# Patient Record
Sex: Female | Born: 1960 | Race: White | Hispanic: No | Marital: Married | Smoking: Current every day smoker
Health system: Southern US, Community
[De-identification: ages and names within clinical notes are randomized; demographics above are authoritative.]

## PROBLEM LIST (undated history)

## (undated) DIAGNOSIS — F419 Anxiety disorder, unspecified: Secondary | ICD-10-CM

## (undated) DIAGNOSIS — J449 Chronic obstructive pulmonary disease, unspecified: Secondary | ICD-10-CM

## (undated) DIAGNOSIS — F1910 Other psychoactive substance abuse, uncomplicated: Secondary | ICD-10-CM

## (undated) DIAGNOSIS — J45909 Unspecified asthma, uncomplicated: Secondary | ICD-10-CM

## (undated) DIAGNOSIS — M5136 Other intervertebral disc degeneration, lumbar region: Secondary | ICD-10-CM

## (undated) DIAGNOSIS — M7989 Other specified soft tissue disorders: Secondary | ICD-10-CM

## (undated) DIAGNOSIS — R0600 Dyspnea, unspecified: Secondary | ICD-10-CM

## (undated) DIAGNOSIS — K219 Gastro-esophageal reflux disease without esophagitis: Secondary | ICD-10-CM

## (undated) DIAGNOSIS — Z87442 Personal history of urinary calculi: Secondary | ICD-10-CM

## (undated) DIAGNOSIS — J189 Pneumonia, unspecified organism: Secondary | ICD-10-CM

## (undated) DIAGNOSIS — C801 Malignant (primary) neoplasm, unspecified: Secondary | ICD-10-CM

## (undated) DIAGNOSIS — G629 Polyneuropathy, unspecified: Secondary | ICD-10-CM

## (undated) DIAGNOSIS — E119 Type 2 diabetes mellitus without complications: Secondary | ICD-10-CM

## (undated) DIAGNOSIS — M1990 Unspecified osteoarthritis, unspecified site: Secondary | ICD-10-CM

## (undated) DIAGNOSIS — M51369 Other intervertebral disc degeneration, lumbar region without mention of lumbar back pain or lower extremity pain: Secondary | ICD-10-CM

## (undated) DIAGNOSIS — F32A Depression, unspecified: Secondary | ICD-10-CM

## (undated) DIAGNOSIS — Z87898 Personal history of other specified conditions: Secondary | ICD-10-CM

## (undated) DIAGNOSIS — I10 Essential (primary) hypertension: Secondary | ICD-10-CM

## (undated) DIAGNOSIS — F1011 Alcohol abuse, in remission: Secondary | ICD-10-CM

## (undated) DIAGNOSIS — F1291 Cannabis use, unspecified, in remission: Secondary | ICD-10-CM

## (undated) HISTORY — PX: TONSILLECTOMY: SUR1361

## (undated) HISTORY — PX: OTHER SURGICAL HISTORY: SHX169

## (undated) HISTORY — DX: Personal history of other specified conditions: Z87.898

## (undated) HISTORY — DX: Essential (primary) hypertension: I10

## (undated) HISTORY — DX: Other psychoactive substance abuse, uncomplicated: F19.10

## (undated) HISTORY — DX: Alcohol abuse, in remission: F10.11

## (undated) HISTORY — DX: Cannabis use, unspecified, in remission: F12.91

## (undated) HISTORY — PX: DILATION AND CURETTAGE OF UTERUS: SHX78

## (undated) HISTORY — DX: Type 2 diabetes mellitus without complications: E11.9

## (undated) HISTORY — PX: ECTOPIC PREGNANCY SURGERY: SHX613

## (undated) HISTORY — DX: Unspecified asthma, uncomplicated: J45.909

---

## 1982-12-29 DIAGNOSIS — Z8543 Personal history of malignant neoplasm of ovary: Secondary | ICD-10-CM

## 1982-12-29 HISTORY — DX: Personal history of malignant neoplasm of ovary: Z85.43

## 1983-12-30 HISTORY — PX: CERVICAL CONIZATION W/BX: SHX1330

## 2012-07-26 DIAGNOSIS — J439 Emphysema, unspecified: Secondary | ICD-10-CM

## 2012-07-26 HISTORY — DX: Emphysema, unspecified: J43.9

## 2014-02-03 ENCOUNTER — Ambulatory Visit (INDEPENDENT_AMBULATORY_CARE_PROVIDER_SITE_OTHER): Payer: Managed Care, Other (non HMO) | Admitting: Physician Assistant

## 2014-02-03 ENCOUNTER — Encounter: Payer: Self-pay | Admitting: Physician Assistant

## 2014-02-03 VITALS — BP 122/79 | HR 127 | Wt 198.0 lb

## 2014-02-03 DIAGNOSIS — G629 Polyneuropathy, unspecified: Secondary | ICD-10-CM

## 2014-02-03 DIAGNOSIS — C539 Malignant neoplasm of cervix uteri, unspecified: Secondary | ICD-10-CM

## 2014-02-03 DIAGNOSIS — Z23 Encounter for immunization: Secondary | ICD-10-CM

## 2014-02-03 DIAGNOSIS — IMO0001 Reserved for inherently not codable concepts without codable children: Secondary | ICD-10-CM

## 2014-02-03 DIAGNOSIS — I498 Other specified cardiac arrhythmias: Secondary | ICD-10-CM

## 2014-02-03 DIAGNOSIS — G589 Mononeuropathy, unspecified: Secondary | ICD-10-CM

## 2014-02-03 DIAGNOSIS — R000 Tachycardia, unspecified: Secondary | ICD-10-CM

## 2014-02-03 DIAGNOSIS — Z1239 Encounter for other screening for malignant neoplasm of breast: Secondary | ICD-10-CM

## 2014-02-03 DIAGNOSIS — M7630 Iliotibial band syndrome, unspecified leg: Secondary | ICD-10-CM

## 2014-02-03 DIAGNOSIS — M2420 Disorder of ligament, unspecified site: Secondary | ICD-10-CM

## 2014-02-03 DIAGNOSIS — IMO0002 Reserved for concepts with insufficient information to code with codable children: Secondary | ICD-10-CM

## 2014-02-03 DIAGNOSIS — I10 Essential (primary) hypertension: Secondary | ICD-10-CM

## 2014-02-03 DIAGNOSIS — R4789 Other speech disturbances: Secondary | ICD-10-CM

## 2014-02-03 DIAGNOSIS — Z1211 Encounter for screening for malignant neoplasm of colon: Secondary | ICD-10-CM

## 2014-02-03 DIAGNOSIS — J45909 Unspecified asthma, uncomplicated: Secondary | ICD-10-CM | POA: Insufficient documentation

## 2014-02-03 DIAGNOSIS — E1165 Type 2 diabetes mellitus with hyperglycemia: Secondary | ICD-10-CM | POA: Insufficient documentation

## 2014-02-03 DIAGNOSIS — M629 Disorder of muscle, unspecified: Secondary | ICD-10-CM

## 2014-02-03 DIAGNOSIS — R413 Other amnesia: Secondary | ICD-10-CM

## 2014-02-03 HISTORY — DX: Essential (primary) hypertension: I10

## 2014-02-03 MED ORDER — AMITRIPTYLINE HCL 150 MG PO TABS: 150.0000 mg | ORAL_TABLET | Freq: Every day | ORAL | Status: DC

## 2014-02-03 NOTE — Patient Instructions (Signed)
Iliotibial Band Syndrome  with Rehab  The iliotibial (IT) band is a tendon that connects the hip muscles to the shinbone (tibia) and to one of the bones of the pelvis (ileum). The IT band passes by the knee and is often irritated by the outer portion of the knee (lateral femoral condyle). A fluid filled sac (bursa) exists between the tendon and the bone, to cushion and reduce friction. Overuse of the tendon may cause excessive friction, which results in IT band syndrome. This condition involves inflammation of the bursa (bursitis) and/or inflammation of the IT band (tendinitis).  SYMPTOMS   · Pain, tenderness, swelling, warmth, or redness over the IT band, at the outer knee (above the joint).  · Pain that travels up or down the thigh or leg.  · Initially, pain at the beginning of an exercise, that decreases once warmed up. Eventually, pain throughout the activity, getting worse as the activity continues. May cause the athlete to stop in the middle of training or competing.  · Pain that gets worse when running down hills or stairs, on banked tracks, or next to the curb on the street.  · Pain that increases when the foot of the affected leg hits the ground.  · Possibly, a crackling sound (crepitation) when the tendon or bursa is moved or touched.  CAUSES   IT band syndrome is caused by irritation of the IT band and the underlying bursa. This eventually results in inflammation and pain. IT band syndrome is an overuse injury.   RISK INCREASES WITH:  · Sports with repetitive knee-bending activities (distance running, cycling).  · Incorrect training techniques, including sudden changes in the intensity, frequency, or duration of training.  · Not enough rest between workouts.  · Poor strength and flexibility, especially a tight IT band.  · Failure to warm up properly before activity.  · Bow legs.  · Arthritis of the knee.  PREVENTION   · Warm up and stretch properly before activity.  · Allow for adequate recovery between  workouts.  · Maintain physical fitness:  · Strength, flexibility, and endurance.  · Cardiovascular fitness.  · Learn and use proper training technique, including reducing running mileage, shortening stride, and avoiding running on hills and banked surfaces.  · Wear arch supports (orthotics), if you have flat feet.  PROGNOSIS   If treated properly, IT band syndrome usually goes away within 6 weeks of treatment.  RELATED COMPLICATIONS   · Longer healing time, if not properly treated, or if not given enough time to heal.  · Recurring inflammation of the tendon and bursa, that may result in a chronic condition.  · Recurring symptoms, if activity is resumed too soon, with overuse, with a direct blow, or with poor training technique.  · Inability to complete training or competition.  TREATMENT   Treatment first involves the use of ice and medicine, to reduce pain and inflammation. The use of strengthening and stretching exercises may help reduce pain with activity. These exercises may be performed at home or with a therapist. For individuals with flat feet, an arch support (orthotic) may be helpful. Some individuals find that wearing a knee sleeve or compression bandage around the knee during workouts provides some relief. Certain training techniques, such as adjusting stride length, avoiding running on hills or stairs, changing the direction you run on a circular or banked track, or changing the side of the road you run on, if you run next to the curb, may help decrease symptoms   of IT band syndrome. Cyclists may need to change the seat height or foot position on their bicycles. An injection of cortisone into the bursa may be recommended. Surgery to remove the inflamed bursa and/or part of the IT band is only considered after at least 6 months of non-surgical treatment.   MEDICATION   · If pain medicine is needed, nonsteroidal anti-inflammatory medicines (aspirin and ibuprofen), or other minor pain relievers  (acetaminophen), are often advised.  · Do not take pain medicine for 7 days before surgery.  · Prescription pain relievers may be given, if your caregiver thinks they are needed. Use only as directed and only as much as you need.  · Corticosteroid injections may be given by your caregiver. These injections should be reserved for the most serious cases, because they may only be given a certain number of times.  HEAT AND COLD  · Cold treatment (icing) should be applied for 10 to 15 minutes every 2 to 3 hours for inflammation and pain, and immediately after activity that aggravates your symptoms. Use ice packs or an ice massage.  · Heat treatment may be used before performing stretching and strengthening activities prescribed by your caregiver, physical therapist, or athletic trainer. Use a heat pack or a warm water soak.  SEEK MEDICAL CARE IF:   · Symptoms get worse or do not improve in 2 to 4 weeks, despite treatment.  · New, unexplained symptoms develop. (Drugs used in treatment may produce side effects.)  EXERCISES   RANGE OF MOTION (ROM) AND STRETCHING EXERCISES - Iliotibial Band Syndrome  These exercises may help you when beginning to rehabilitate your injury. Your symptoms may go away with or without further involvement from your physician, physical therapist or athletic trainer. While completing these exercises, remember:   · Restoring tissue flexibility helps normal motion to return to the joints. This allows healthier, less painful movement and activity.  · An effective stretch should be held for at least 30 seconds.  · A stretch should never be painful. You should only feel a gentle lengthening or release in the stretched tissue.  STRETCH - Quadriceps, Prone   · Lie on your stomach on a firm surface, such as a bed or padded floor.  · Bend your right / left knee and grasp your ankle. If you are unable to reach your ankle or pant leg, use a belt around your foot to lengthen your reach.  · Gently pull your heel  toward your buttocks. Your knee should not slide out to the side. You should feel a stretch in the front of your thigh and knee.  · Hold this position for __________ seconds.  Repeat __________ times. Complete this stretch __________ times per day.   STRETCH  Iliotibial Band  · On the floor or bed, lie on your side, so your right / left leg is on top. Bend your knee and grab your ankle.  · Slowly bring your knee back so that your thigh is in line with your trunk. Keep your heel at your buttocks and gently arch your back, so your head, shoulders and hips line up.  · Slowly lower your leg so that your knee approaches the floor or bed, until you feel a gentle stretch on the outside of your right / left thigh. If you do not feel a stretch and your knee will not fall farther, place the heel of your opposite foot on top of your knee, and pull your thigh down farther.  ·   Hold this stretch for __________ seconds.  Repeat __________ times. Complete this stretch __________ times per day.  STRENGTHENING EXERCISES - Iliotibial Band Syndrome  Improving the flexibility of the IT band will best relieve your discomfort due to IT band syndrome. Strengthening exercises, however, can help improve both muscle endurance and joint mechanics, reducing the factors that can contribute to this condition. Your physician, physical therapist or athletic trainer may provide you with exercises that train specific muscle groups that are especially weak. The following exercises target muscles that are often weak in people who have IT band syndrome.  STRENGTH - Hip Abductors, Straight Leg Raises   Be aware of your form throughout the entire exercise, so that you exercise the correct muscles. Poor form means that you are not strengthening the correct muscles.  · Lie on your side, so that your head, shoulders, knee and hip line up. You may bend your lower knee to help maintain your balance. Your right / left leg should be on top.  · Roll your hips  slightly forward, so that your hips are stacked directly over each other and your right / left knee is facing forward.  · Lift your top leg up 4-6 inches, leading with your heel. Be sure that your foot does not drift forward and that your knee does not roll toward the ceiling.  · Hold this position for __________ seconds. You should feel the muscles in your outer hip lifting (you may not notice this until your leg begins to tire).  · Slowly lower your leg to the starting position. Allow the muscles to fully relax before beginning the next repetition.  Repeat __________ times. Complete this exercise __________ times per day.   STRENGTH - Quad/VMO, Isometric  · Sit in a chair with your right / left knee slightly bent. With your fingertips, feel the VMO muscle (just above the inside of your knee). The VMO is important in controlling the position of your kneecap.  · Keeping your fingertips on this muscle. Without actually moving your leg, attempt to drive your knee down, as if straightening your leg. You should feel your VMO tense. If you have a difficult time, you may wish to try the same exercise on your healthy knee first.  · Tense this muscle as hard as you can, without increasing any knee pain.  · Hold for __________ seconds. Relax the muscles slowly and completely between each repetition.  Repeat __________ times. Complete this exercise __________ times per day.   Document Released: 12/15/2005 Document Revised: 03/08/2012 Document Reviewed: 03/29/2009  [employer]® Patient Information ©2014 [employer].

## 2014-02-05 DIAGNOSIS — G629 Polyneuropathy, unspecified: Secondary | ICD-10-CM | POA: Insufficient documentation

## 2014-02-05 DIAGNOSIS — C539 Malignant neoplasm of cervix uteri, unspecified: Secondary | ICD-10-CM | POA: Insufficient documentation

## 2014-02-05 DIAGNOSIS — M7630 Iliotibial band syndrome, unspecified leg: Secondary | ICD-10-CM

## 2014-02-05 DIAGNOSIS — R413 Other amnesia: Secondary | ICD-10-CM | POA: Insufficient documentation

## 2014-02-05 DIAGNOSIS — R000 Tachycardia, unspecified: Secondary | ICD-10-CM | POA: Insufficient documentation

## 2014-02-05 DIAGNOSIS — R4789 Other speech disturbances: Secondary | ICD-10-CM | POA: Insufficient documentation

## 2014-02-05 HISTORY — DX: Tachycardia, unspecified: R00.0

## 2014-02-05 HISTORY — DX: Other amnesia: R41.3

## 2014-02-05 HISTORY — DX: Polyneuropathy, unspecified: G62.9

## 2014-02-05 HISTORY — DX: Other speech disturbances: R47.89

## 2014-02-05 HISTORY — DX: Malignant neoplasm of cervix uteri, unspecified: C53.9

## 2014-02-05 HISTORY — DX: Iliotibial band syndrome, unspecified leg: M76.30

## 2014-02-05 NOTE — Progress Notes (Signed)
Subjective:    Patient ID: Kathleen Le, female    DOB: June 22, 1961, 53 y.o.   MRN: [id number]  HPI Pt is a 53 yo WF who presents to the clinic to establish care.  DM- per pt was last seen a month ago. Cannot remember A1C. Does not check sugars regularly. Denies any hypoglyemic events.   HTN- taking medications regularly. NO CP, palpitations, vision changes, headaches.   Left leg pain- started 3 months ago once starting exerciseing more. Hurts worse after exercising and a lot of walking. Better with rest. Ibuprofen does help. Pain located on the outside of left leg from hip to below knee.   Having some difficulty finding words and feels like memory is getting worse. Her children will tell her things and she does not remember. She will be talking and cannot think of words to say.   Asthma- no problems or symptoms. No regular or PRN inhalers.   Missed period- no period in 2 months. Recently went off OCP. Concerned she could be pregnant.   . Active Ambulatory Problems    Diagnosis Date Noted  . Type II diabetes mellitus, uncontrolled 02/03/2014  . Unspecified asthma(493.90) 02/03/2014  . Essential hypertension, benign 02/03/2014  . Neuropathy 02/05/2014  . IT band syndrome 02/05/2014  . Sinus tachycardia 02/05/2014  . Memory changes 02/05/2014  . Word finding difficulty 02/05/2014  . Cervical cancer 02/05/2014   Resolved Ambulatory Problems    Diagnosis Date Noted  . No Resolved Ambulatory Problems   Past Medical History  Diagnosis Date  . Hypertension   . Diabetes mellitus without complication   . Asthma     . Family History  Problem Relation Age of Onset  . Heart attack Mother   . Cancer Mother   . Diabetes Father   . Heart attack Father   . Diabetes Paternal Uncle   . Cancer Paternal Grandmother   . Diabetes Paternal Grandmother   . Diabetes Paternal Grandfather   . Diabetes Paternal Uncle   . Heart attack Maternal Aunt   . Cancer Maternal Uncle   . Cancer  Maternal Grandmother     . History   Social History  . Marital Status: Married    Spouse Name: N/A    Number of Children: N/A  . Years of Education: N/A   Occupational History  . Not on file.   Social History Main Topics  . Smoking status: Current Every Day Smoker  . Smokeless tobacco: Not on file  . Alcohol Use: No  . Drug Use: No  . Sexual Activity: Yes   Other Topics Concern  . Not on file   Social History Narrative  . No narrative on file     Review of Systems  All other systems reviewed and are negative.       Objective:   Physical Exam  Constitutional: She is oriented to person, place, and time. She appears well-developed and well-nourished.  HENT:  Head: Normocephalic and atraumatic.  Cardiovascular: Regular rhythm and normal heart sounds.   Tachycardia at 107 at recheck.   Pulmonary/Chest: Effort normal and breath sounds normal. She has no wheezes.  Musculoskeletal:  Left leg- no pain over hip or knee joint. No tenderness over muscles today. Strength 5/5. Patellar reflexes 2+ bilateral. No knee joint pain. Some pain below lateral knee. ROM at hip and knee full and without pain. Negative mcmurrays, anterior drawer negative. Discomfort with internal rotation at hip.  Neurological: She is alert and oriented to person,  place, and time.  Skin: Skin is dry.  Psychiatric: She has a normal mood and affect. Her behavior is normal.          Assessment & Plan:  Diabetes mellitus- will check at next visit once get records. Need pneumonia vaccine and got today.   HTN- controlled today. Continue on HCTZ.   Sinus tachycardia- rechecked at 107. Will continue to monitor. Will consider BB if continues. Pt does not want to start another medication and admits to being very nervous today.   Asthma- controlled not having to use any inhaler nor rescue inhaler.   Memory changes/word finding difficultly- Dementia panel done. Pt instructed to come back for Mini mental  status exam.   IT band syndrome- lateral leg pain from hip to knee on left leg. Gave stretches, encouraged motrin, icing and stretches. Follow up if not improving in 4 weeks or worsening.   Missed period- UPT negative. Likely transitioning into menopause. Made pt aware of transition. No symptoms to report today.   Will order mammogram/colonoscopy. Pt believes she has recent labs will wait to get records to order labs.   Pt got tdap and pneumonia vaccine today.

## 2014-02-15 ENCOUNTER — Other Ambulatory Visit: Payer: Self-pay | Admitting: Physician Assistant

## 2014-02-15 ENCOUNTER — Telehealth: Payer: Self-pay | Admitting: *Deleted

## 2014-02-15 MED ORDER — AMITRIPTYLINE HCL 100 MG PO TABS: 100.0000 mg | ORAL_TABLET | Freq: Every day | ORAL | Status: DC

## 2014-02-15 NOTE — Telephone Encounter (Signed)
Sent 100mg  to pharmacy.

## 2014-02-15 NOTE — Telephone Encounter (Signed)
Pt states that you were going to decrease her dose of Amtriptyline. States you told her 150mg  was to high. You can send that to the pharmacy listed on pt's profile.  Oscar La, LPN

## 2014-02-23 ENCOUNTER — Ambulatory Visit: Payer: Managed Care, Other (non HMO)

## 2014-02-28 ENCOUNTER — Other Ambulatory Visit: Payer: Self-pay

## 2014-02-28 ENCOUNTER — Ambulatory Visit (INDEPENDENT_AMBULATORY_CARE_PROVIDER_SITE_OTHER): Payer: Managed Care, Other (non HMO)

## 2014-02-28 DIAGNOSIS — Z1231 Encounter for screening mammogram for malignant neoplasm of breast: Secondary | ICD-10-CM

## 2014-03-01 ENCOUNTER — Other Ambulatory Visit: Payer: Self-pay | Admitting: Physician Assistant

## 2014-03-01 LAB — FOLATE: Folate: 17.1 ng/mL

## 2014-03-01 LAB — COMPLETE METABOLIC PANEL WITH GFR
ALBUMIN: 4 g/dL (ref 3.5–5.2)
ALT: 43 U/L — AB (ref 0–35)
AST: 27 U/L (ref 0–37)
Alkaline Phosphatase: 94 U/L (ref 39–117)
BUN: 16 mg/dL (ref 6–23)
CALCIUM: 10 mg/dL (ref 8.4–10.5)
CO2: 27 meq/L (ref 19–32)
Chloride: 97 mEq/L (ref 96–112)
Creat: 0.66 mg/dL (ref 0.50–1.10)
GFR, Est Non African American: >89 mL/min
Glucose, Bld: 181 mg/dL — ABNORMAL HIGH (ref 70–99)
POTASSIUM: 4 meq/L (ref 3.5–5.3)
SODIUM: 134 meq/L — AB (ref 135–145)
TOTAL PROTEIN: 7.3 g/dL (ref 6.0–8.3)
Total Bilirubin: 0.3 mg/dL (ref 0.2–1.2)

## 2014-03-01 LAB — LIPID PANEL
Cholesterol: 223 mg/dL — ABNORMAL HIGH (ref 0–200)
HDL: 86 mg/dL (ref >39)
LDL CALC: 121 mg/dL — AB (ref 0–99)
TRIGLYCERIDES: 82 mg/dL (ref <150)
Total CHOL/HDL Ratio: 2.6 Ratio
VLDL: 16 mg/dL (ref 0–40)

## 2014-03-01 LAB — CBC
HCT: 40.9 % (ref 36.0–46.0)
Hemoglobin: 13.7 g/dL (ref 12.0–15.0)
MCH: 30 pg (ref 26.0–34.0)
MCHC: 33.5 g/dL (ref 30.0–36.0)
MCV: 89.5 fL (ref 78.0–100.0)
PLATELETS: 329 10*3/uL (ref 150–400)
RBC: 4.57 MIL/uL (ref 3.87–5.11)
RDW: 15 % (ref 11.5–15.5)
WBC: 6.3 10*3/uL (ref 4.0–10.5)

## 2014-03-01 LAB — RPR

## 2014-03-01 LAB — VITAMIN B12: Vitamin B-12: 238 pg/mL (ref 211–911)

## 2014-03-01 LAB — TSH: TSH: 1.697 u[IU]/mL (ref 0.350–4.500)

## 2014-03-01 LAB — SEDIMENTATION RATE: Sed Rate: 7 mm/hr (ref 0–22)

## 2014-03-03 ENCOUNTER — Ambulatory Visit (INDEPENDENT_AMBULATORY_CARE_PROVIDER_SITE_OTHER): Payer: Managed Care, Other (non HMO) | Admitting: Physician Assistant

## 2014-03-03 ENCOUNTER — Encounter: Payer: Self-pay | Admitting: Physician Assistant

## 2014-03-03 VITALS — BP 142/81 | HR 125 | Wt 205.0 lb

## 2014-03-03 DIAGNOSIS — E785 Hyperlipidemia, unspecified: Secondary | ICD-10-CM

## 2014-03-03 DIAGNOSIS — E119 Type 2 diabetes mellitus without complications: Secondary | ICD-10-CM

## 2014-03-03 DIAGNOSIS — I10 Essential (primary) hypertension: Secondary | ICD-10-CM

## 2014-03-03 LAB — POCT GLYCOSYLATED HEMOGLOBIN (HGB A1C): Hemoglobin A1C: 8.8

## 2014-03-03 LAB — POCT UA - MICROALBUMIN
Albumin/Creatinine Ratio, Urine, POC: <30
CREATININE, POC: 100 mg/dL
MICROALBUMIN (UR) POC: 10 mg/L

## 2014-03-03 MED ORDER — PREGABALIN 50 MG PO CAPS: 50.0000 mg | ORAL_CAPSULE | Freq: Three times a day (TID) | ORAL | Status: DC

## 2014-03-03 MED ORDER — CANAGLIFLOZIN-METFORMIN HCL 50-1000 MG PO TABS: 1.0000 | ORAL_TABLET | Freq: Two times a day (BID) | ORAL | Status: DC

## 2014-03-03 MED ORDER — SIMVASTATIN 20 MG PO TABS: 20.0000 mg | ORAL_TABLET | Freq: Every day | ORAL | Status: DC

## 2014-03-03 NOTE — Patient Instructions (Addendum)
Oral B12 1000mg  daily. Recheck b12 level in 6-8 weeks.  Start zocor 20mg  daily.  lyrica 50mg  three times a day.  invokamet 1 tablet twice a day. Stop regular metformin.

## 2014-03-03 NOTE — Progress Notes (Signed)
   Subjective:    Patient ID: Kathleen Le, female    DOB: Feb 03, 1961, 53 y.o.   MRN: [id number]  HPI Patient comes into the clinic to address peripheral neuropathy. She was recently establish as a patient. She does have a history of diabetes. She is having worsening numbness and tingling in her left arm and bilateral feet. She has not had her diabetes followed up on an a while. She denies any spinal injury. She has been tried on Neurontin in the past and not been able to tolerate. Would like to have something given to her today to help with symptoms.   DM- patient is currently on Januvia, metformin and glipizide. She does not check her sugars. She denies any hypoglycemic events. She does not have an up-to-date eye exam. She denies any excessive thirst or urination.  Hypertension-patient is currently on Norvasc, HCTZ. She denies any chest pains, palpitations, vision changes, or headaches. She has a history of sinus tachycardia.   Review of Systems     Objective:   Physical Exam  Constitutional: She is oriented to person, place, and time. She appears well-developed and well-nourished.  Obese  HENT:  Head: Normocephalic and atraumatic.  Cardiovascular: Regular rhythm and normal heart sounds.   Tachycardia at 125.  Pulmonary/Chest: Effort normal and breath sounds normal. She has no wheezes.  Neurological: She is alert and oriented to person, place, and time.  Psychiatric: She has a normal mood and affect. Her behavior is normal.          Assessment & Plan:  DM- .[name] Lab Results  Component Value Date   HGBA1C 8.8 03/03/2014   started patient on Invokanmet bid today along with [place]. Stop regular metformin. Will recheck levels in 3 months. Foot exam was normal today. Reminded patient of need for eye exam. After reviewing chart patient is not on an ACE or ARB. Patient's blood pressure was also not controlled. We'll add combination therapy and recheck blood pressure in the next month or  so. Microalbumin was good. Reminded patient of low-carb low sugar diet as well as regular exercise.   Peripheral neuropathy- some of peripheral neuropathy could be do to low B12 and diabetes. Would like to start treatment with Lyrica 50 mg 3 times a day. Patient was not able to tolerate gabapentin in the past. Followup in 4-6 weeks for response. Discussed with patient we could go up on Lyrica if she had improvement.  HTN-patient's blood pressure is borderline today 142/81 as well as tachycardic. Patient has a history of tachycardia. If continues to be elevated next visit we'll have to consider a beta blocker.  Hyperlipidemia- LDLs were 121 however patient is diabetic. I would like for her to start Zocor 20 mg daily. Side effects of muscle aches and pains were discussed. Followup in 3-6 months with lipid checked to assess response.  Elevated liver enzymes- patient given lab slip to have rechecked.  Low b12- I would like for patient to go ahead and start oral B12 1000 mg daily for the next 6-8 weeks and recheck. B12 levels were on the low side and could be even contributing to peripheral neuropathy.

## 2014-03-07 ENCOUNTER — Telehealth: Payer: Self-pay | Admitting: Physician Assistant

## 2014-03-07 NOTE — Telephone Encounter (Signed)
Looking over chart pt needs to be on ACE. Could combine with HCTZ. Can I send to pharmacy. ACE will help BP as well as kidney protect when pt has diabetes.

## 2014-03-08 ENCOUNTER — Telehealth: Payer: Self-pay | Admitting: *Deleted

## 2014-03-08 ENCOUNTER — Other Ambulatory Visit: Payer: Self-pay | Admitting: Physician Assistant

## 2014-03-08 MED ORDER — LISINOPRIL 5 MG PO TABS: 5.0000 mg | ORAL_TABLET | Freq: Every day | ORAL | Status: DC

## 2014-03-08 NOTE — Telephone Encounter (Signed)
LMOM for pt to return call. 

## 2014-03-08 NOTE — Telephone Encounter (Signed)
Sent lisinopril to pharmacy. Will need to continue to take HCTZ separately could not find adequate combination.

## 2014-03-08 NOTE — Telephone Encounter (Signed)
That is to help with sleep. lyrica does not necessarily used for sleep.

## 2014-03-08 NOTE — Telephone Encounter (Signed)
Pt ok with ACE.

## 2014-03-08 NOTE — Telephone Encounter (Signed)
Pt wants to know if she can wean herself off of the elavil now that she is taking the lyrica.  Please advise.

## 2014-03-09 NOTE — Telephone Encounter (Signed)
Left detailed message on vm.

## 2014-03-23 ENCOUNTER — Other Ambulatory Visit: Payer: Self-pay | Admitting: *Deleted

## 2014-03-23 ENCOUNTER — Telehealth: Payer: Self-pay | Admitting: *Deleted

## 2014-03-23 MED ORDER — CANAGLIFLOZIN-METFORMIN HCL 50-1000 MG PO TABS: 1.0000 | ORAL_TABLET | Freq: Two times a day (BID) | ORAL | Status: DC

## 2014-03-23 NOTE — Telephone Encounter (Signed)
Pt is wanting clarification on the invokamet.  She has it written down with the samples we gave her to take it once a day but the rx you sent over says bid.

## 2014-03-24 NOTE — Telephone Encounter (Signed)
Pt.notified

## 2014-03-24 NOTE — Telephone Encounter (Signed)
Take twice a day because it has metformin in it with the invokana. Sometimes we start once a day because of new medication that makes you pee a little more. Do not need extra metformin already in new tab just for clarification.

## 2014-03-29 ENCOUNTER — Telehealth: Payer: Self-pay | Admitting: *Deleted

## 2014-03-29 NOTE — Telephone Encounter (Signed)
Prior auth approved for [employer].  Approval dates 03/29/14-03/30/15.

## 2014-03-30 ENCOUNTER — Encounter: Payer: Self-pay | Admitting: Physician Assistant

## 2014-04-04 ENCOUNTER — Telehealth: Payer: Self-pay | Admitting: *Deleted

## 2014-04-04 NOTE — Telephone Encounter (Signed)
Prior auth approved for Lyrica.

## 2014-04-06 ENCOUNTER — Other Ambulatory Visit: Payer: Self-pay | Admitting: Physician Assistant

## 2014-04-06 ENCOUNTER — Telehealth: Payer: Self-pay | Admitting: Physician Assistant

## 2014-04-06 NOTE — Telephone Encounter (Signed)
Patient called left vm to about a prescription costing too much even though it has been approved and wanted to know if she had any other options in regards to a cheaper prescription. Pt req a call bac. Thanks

## 2014-04-30 ENCOUNTER — Other Ambulatory Visit: Payer: Self-pay | Admitting: Physician Assistant

## 2014-05-02 ENCOUNTER — Other Ambulatory Visit: Payer: Self-pay | Admitting: *Deleted

## 2014-05-02 MED ORDER — GLIPIZIDE ER 10 MG PO TB24: ORAL_TABLET | ORAL | Status: DC

## 2014-05-17 ENCOUNTER — Encounter: Payer: Self-pay | Admitting: Physician Assistant

## 2014-05-17 ENCOUNTER — Ambulatory Visit (INDEPENDENT_AMBULATORY_CARE_PROVIDER_SITE_OTHER): Payer: Managed Care, Other (non HMO) | Admitting: Physician Assistant

## 2014-05-17 ENCOUNTER — Ambulatory Visit (INDEPENDENT_AMBULATORY_CARE_PROVIDER_SITE_OTHER): Payer: Managed Care, Other (non HMO)

## 2014-05-17 VITALS — BP 117/80 | HR 130 | Wt 207.0 lb

## 2014-05-17 DIAGNOSIS — N898 Other specified noninflammatory disorders of vagina: Secondary | ICD-10-CM

## 2014-05-17 DIAGNOSIS — M25476 Effusion, unspecified foot: Secondary | ICD-10-CM

## 2014-05-17 DIAGNOSIS — M545 Low back pain, unspecified: Secondary | ICD-10-CM

## 2014-05-17 DIAGNOSIS — M5430 Sciatica, unspecified side: Secondary | ICD-10-CM

## 2014-05-17 DIAGNOSIS — F411 Generalized anxiety disorder: Secondary | ICD-10-CM

## 2014-05-17 DIAGNOSIS — M5440 Lumbago with sciatica, unspecified side: Principal | ICD-10-CM

## 2014-05-17 DIAGNOSIS — G589 Mononeuropathy, unspecified: Secondary | ICD-10-CM

## 2014-05-17 DIAGNOSIS — L293 Anogenital pruritus, unspecified: Secondary | ICD-10-CM

## 2014-05-17 DIAGNOSIS — N926 Irregular menstruation, unspecified: Secondary | ICD-10-CM

## 2014-05-17 DIAGNOSIS — F32A Depression, unspecified: Secondary | ICD-10-CM

## 2014-05-17 DIAGNOSIS — G629 Polyneuropathy, unspecified: Secondary | ICD-10-CM

## 2014-05-17 DIAGNOSIS — R000 Tachycardia, unspecified: Secondary | ICD-10-CM

## 2014-05-17 DIAGNOSIS — R300 Dysuria: Secondary | ICD-10-CM

## 2014-05-17 DIAGNOSIS — I498 Other specified cardiac arrhythmias: Secondary | ICD-10-CM

## 2014-05-17 DIAGNOSIS — F3289 Other specified depressive episodes: Secondary | ICD-10-CM

## 2014-05-17 DIAGNOSIS — F419 Anxiety disorder, unspecified: Secondary | ICD-10-CM

## 2014-05-17 DIAGNOSIS — M25473 Effusion, unspecified ankle: Secondary | ICD-10-CM

## 2014-05-17 DIAGNOSIS — F329 Major depressive disorder, single episode, unspecified: Secondary | ICD-10-CM

## 2014-05-17 DIAGNOSIS — M4310 Spondylolisthesis, site unspecified: Secondary | ICD-10-CM

## 2014-05-17 LAB — POCT URINALYSIS DIPSTICK
BILIRUBIN UA: NEGATIVE
Blood, UA: NEGATIVE
GLUCOSE UA: 500
KETONES UA: NEGATIVE
Leukocytes, UA: NEGATIVE
Nitrite, UA: NEGATIVE
PROTEIN UA: NEGATIVE
Urobilinogen, UA: 0.2
pH, UA: 5.5

## 2014-05-17 LAB — WET PREP FOR TRICH, YEAST, CLUE
Clue Cells Wet Prep HPF POC: NONE SEEN
Trich, Wet Prep: NONE SEEN
WBC, Wet Prep HPF POC: NONE SEEN
Yeast Wet Prep HPF POC: NONE SEEN

## 2014-05-17 LAB — POCT URINE PREGNANCY: PREG TEST UR: NEGATIVE

## 2014-05-17 IMAGING — CR DG LUMBAR SPINE COMPLETE 4+V
5 series · 5 of 5 positions shown · non-contrast
Comparison: None.

CLINICAL DATA: Low back pain with peripheral neuropathy

EXAM:
LUMBAR SPINE - COMPLETE 4+ VIEW

[view not recorded (1 of 5)]
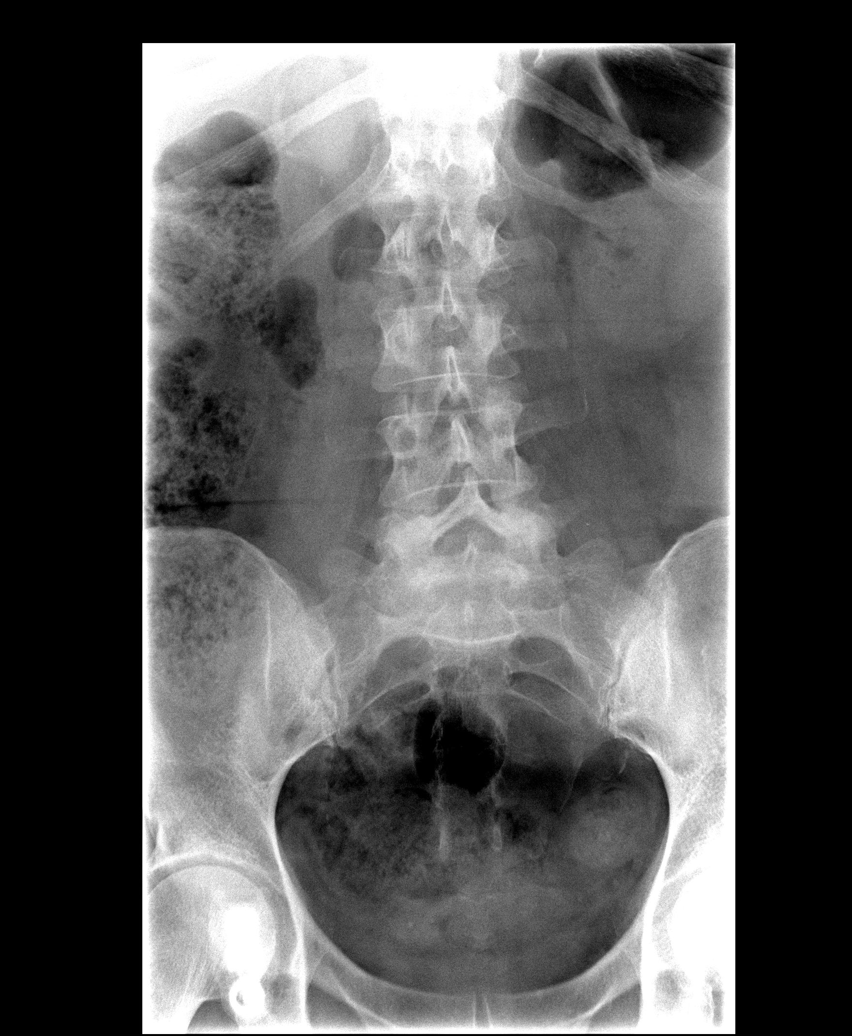

[view not recorded (2 of 5)]
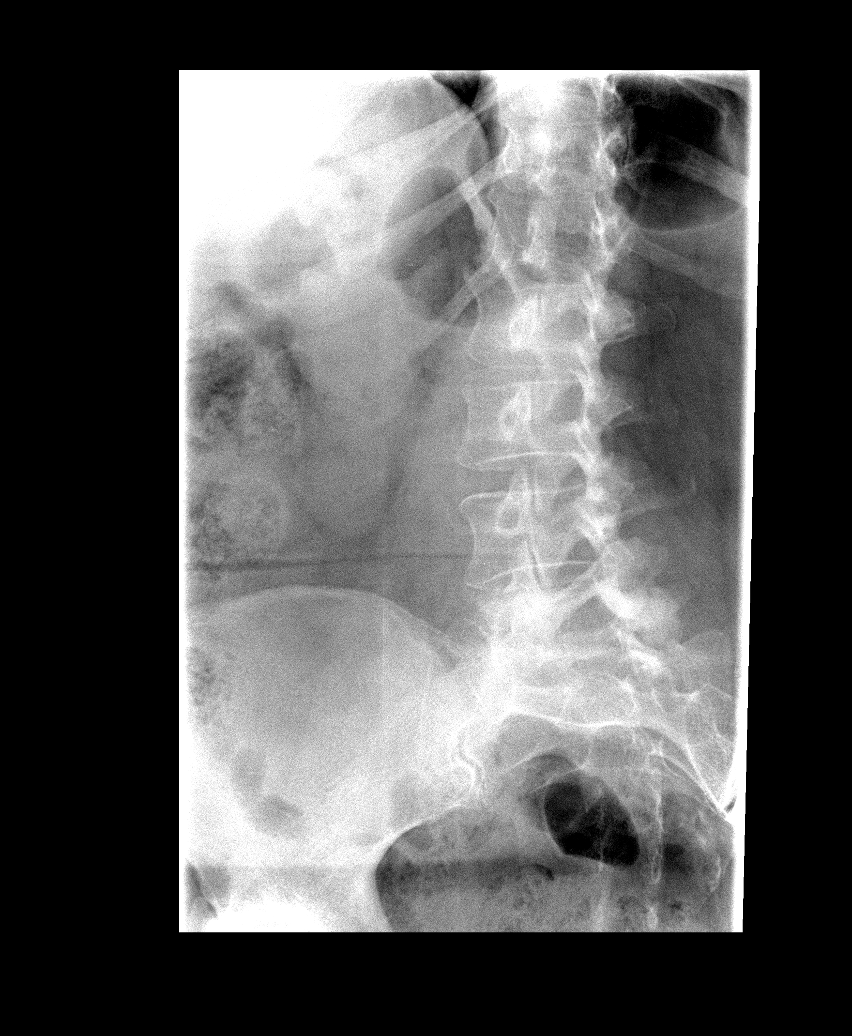

[view not recorded (3 of 5)]
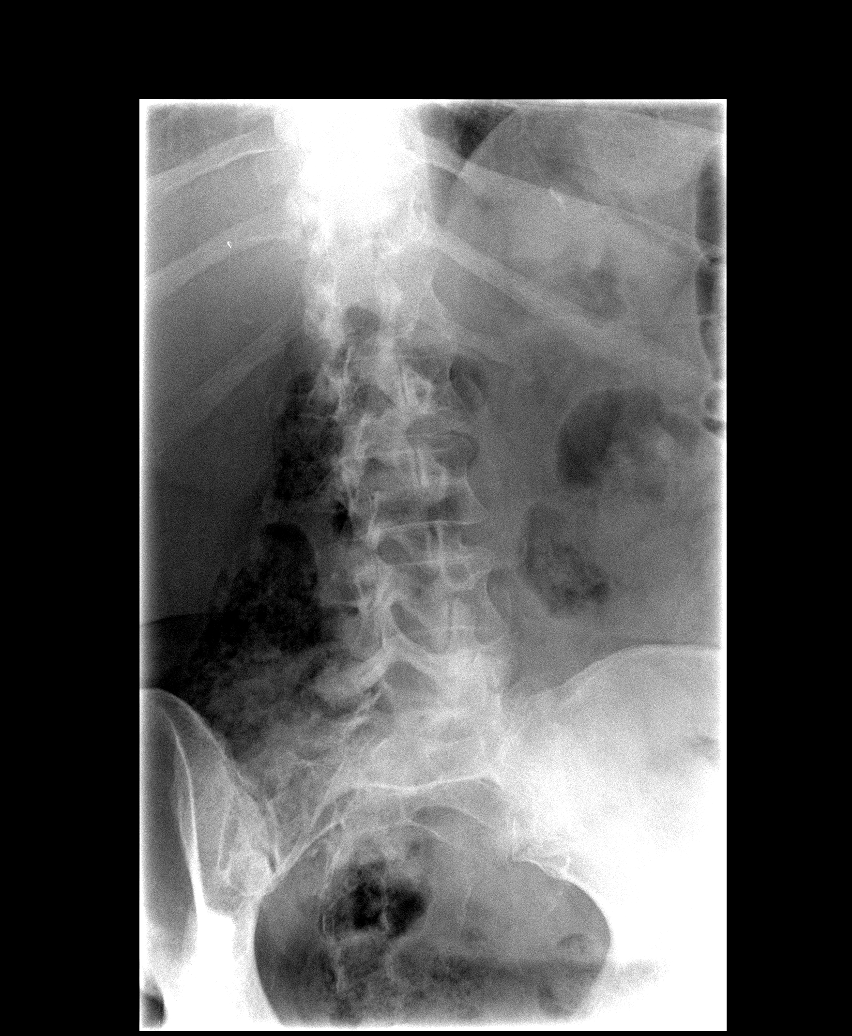

[view not recorded (4 of 5)]
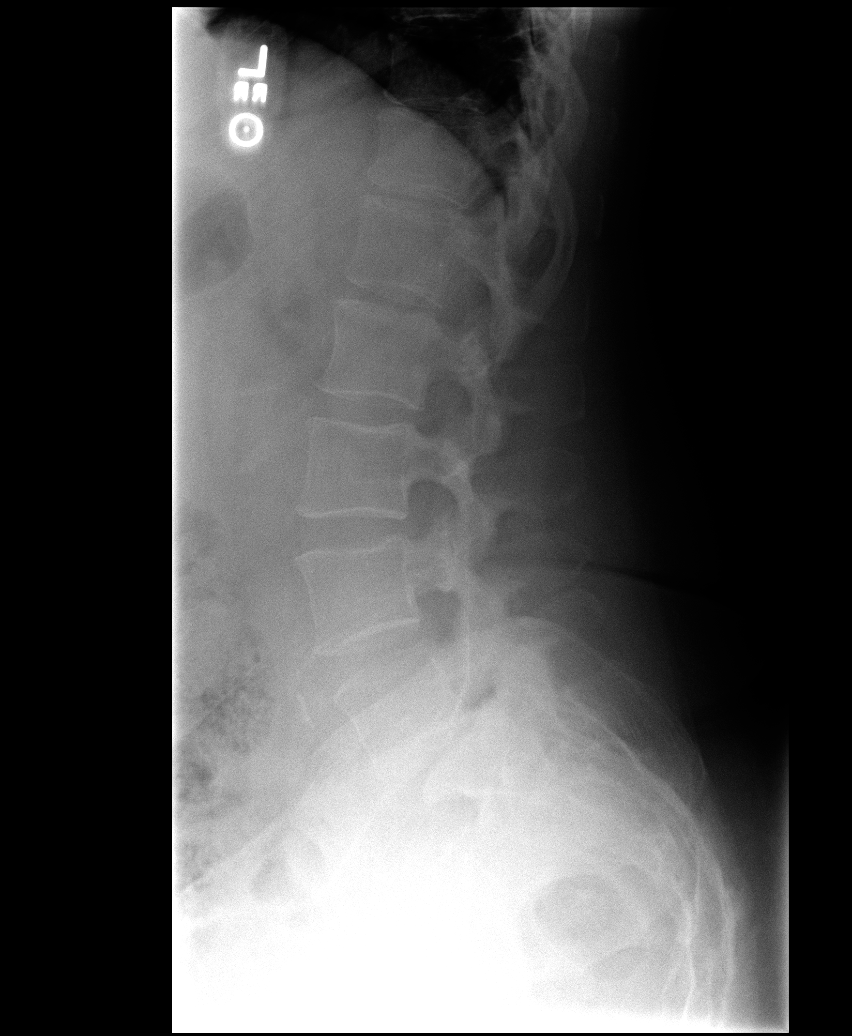

[view not recorded (5 of 5)]
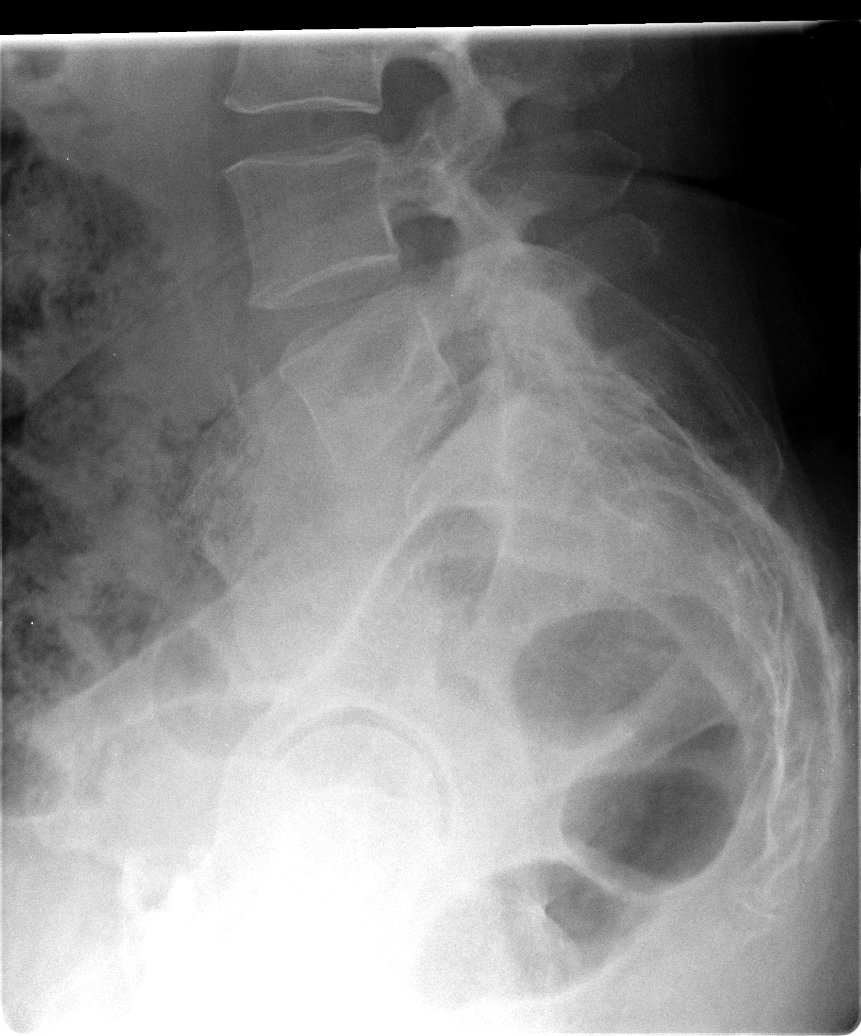

[5 of 5 positions shown; findings below may reference images not displayed]

FINDINGS: There is anterolisthesis of L5 on S1 by 9 mm with degenerative disc
disease at L5-S1. This anterolisthesis appears to be due to
degenerative change, with no definite pars defect noted. The
remainder of the intervertebral disc spaces appear normal. No
compression deformity is seen. The SI joints are corticated.
IMPRESSION: 1. 9 mm anterolisthesis of L5 on S1 appears to be due to
degenerative change involving the facet joints.
2. Degenerative disc disease at L5-S1.

## 2014-05-17 MED ORDER — MELOXICAM 15 MG PO TABS: 15.0000 mg | ORAL_TABLET | Freq: Every day | ORAL | Status: DC

## 2014-05-17 MED ORDER — DULOXETINE HCL 30 MG PO CPEP: 30.0000 mg | ORAL_CAPSULE | Freq: Every day | ORAL | Status: DC

## 2014-05-17 MED ORDER — FLUCONAZOLE 150 MG PO TABS: 150.0000 mg | ORAL_TABLET | Freq: Once | ORAL | Status: DC

## 2014-05-17 MED ORDER — PREGABALIN 50 MG PO CAPS: 50.0000 mg | ORAL_CAPSULE | Freq: Three times a day (TID) | ORAL | Status: DC

## 2014-05-17 NOTE — Progress Notes (Signed)
Subjective:    Patient ID: Kathleen Le, female    DOB: 09/17/1961, 53 y.o.   MRN: [id number]  HPI Pt presents to the clinic to discuss ongoing vaginal itching and discharge since start invokamet. She has been using monistat OTC and been helping but does not seem to go away. There is itching and burning. No fever, chills, n/v/d.   Sinus tachycardia- HR remains elevated today as in the past. Never been treated. Denies any palpitations, headaches, vision changes.   Neuropathy of feet- ongoing never fully been evaluated. A!C is elevated but I don't feel coming solely from diabetes. Denies any bowel or bladder dysfunction or saddle anthesia. She does have low back pain and has for many years. She did go to chiropractor which really helped back pain but not able to afford. lyrica also helped but card did not work and was going to be over 200 dollars a month. Radiates down the back of her left leg.   Pt is over all down. She is sad because she can't work due to pain and her husband works all the time. Her daughter does not understand her pain. She has no motivation. She feels hopeless. Denies any suicidal thoughts.       Review of Systems  All other systems reviewed and are negative.      Objective:   Physical Exam  Constitutional: She appears well-developed and well-nourished.  Cardiovascular: Regular rhythm and normal heart sounds.   Tachycardia at 130. Rechecked was better at 122 but not significantly.   Psychiatric: She has a normal mood and affect. Her behavior is normal.          Assessment & Plan:  Medication reaction/vaginitis- will get wet prep. Discussed this can be reaction to invokamet. I would still like to treat with diflucan once and see if symptoms resolve. If continue will have to stop medication and find another alternative to lower a1c. Follow up in 1 month.   .. Results for orders placed in visit on 05/17/14  WET PREP FOR [place], YEAST, CLUE      Result Value Ref  Range   Yeast Wet Prep HPF POC NONE SEEN  NONE SEEN   Trich, Wet Prep NONE SEEN  NONE SEEN   Clue Cells Wet Prep HPF POC NONE SEEN  NONE SEEN   WBC, Wet Prep HPF POC NONE SEEN  NONE SEEN  POCT URINALYSIS DIPSTICK      Result Value Ref Range   Color, UA dark yellow     Clarity, UA clear     Glucose, UA 500     Bilirubin, UA neg     Ketones, UA neg     Spec Grav, UA >=1.030     Blood, UA neg     pH, UA 5.5     Protein, UA neg     Urobilinogen, UA 0.2     Nitrite, UA neg     Leukocytes, UA Negative    POCT URINE PREGNANCY      Result Value Ref Range   Preg Test, Ur Negative     No signs of UTI. Glucose found as expected.   Missed periods- no period for 6 months likely going through menopause but pt concerned and would like UPT. UPT was negative.   Depression/anxiety- PHQ-9 was 18. gAD-7 was 13. I would like to treat with cymbalta. Idea is that would help with depression and some of patients ongoing pain. Discussed side effects of cymbalta. Pt aware.  If having worsening depression please call office and stop medication.  Sinus tachycardia- historically been elevated. Will start atenolol today. Follow up in one month.    Low back pain with radiation down left leg- will get xrays today.  I would like to try lyrica again. There is no reason card should not work. We did not have samples to give. Pt has ongoing neuropathy of hands and feet. Metabolic labs have been done and negative for any vitamin cause of neuropathy. I would like for pt to see Dr. Darene Lamer in next 2 weeks for further evaluation. Continue elavil for pain management. Added mobic daily for next 2 weeks then as needed.

## 2014-05-17 NOTE — Patient Instructions (Signed)

## 2014-05-19 ENCOUNTER — Encounter: Payer: Self-pay | Admitting: Physician Assistant

## 2014-05-19 ENCOUNTER — Telehealth: Payer: Self-pay | Admitting: Physician Assistant

## 2014-05-19 DIAGNOSIS — F419 Anxiety disorder, unspecified: Secondary | ICD-10-CM | POA: Insufficient documentation

## 2014-05-19 DIAGNOSIS — M5440 Lumbago with sciatica, unspecified side: Secondary | ICD-10-CM

## 2014-05-19 DIAGNOSIS — M5136 Other intervertebral disc degeneration, lumbar region: Secondary | ICD-10-CM | POA: Insufficient documentation

## 2014-05-19 DIAGNOSIS — G8929 Other chronic pain: Secondary | ICD-10-CM | POA: Insufficient documentation

## 2014-05-19 DIAGNOSIS — F32A Depression, unspecified: Secondary | ICD-10-CM | POA: Insufficient documentation

## 2014-05-19 DIAGNOSIS — M51369 Other intervertebral disc degeneration, lumbar region without mention of lumbar back pain or lower extremity pain: Secondary | ICD-10-CM

## 2014-05-19 DIAGNOSIS — F329 Major depressive disorder, single episode, unspecified: Secondary | ICD-10-CM | POA: Insufficient documentation

## 2014-05-19 HISTORY — DX: Other intervertebral disc degeneration, lumbar region without mention of lumbar back pain or lower extremity pain: M51.369

## 2014-05-19 MED ORDER — ATENOLOL 25 MG PO TABS: 25.0000 mg | ORAL_TABLET | Freq: Every day | ORAL | Status: DC

## 2014-05-19 NOTE — Telephone Encounter (Signed)
Left detailed message.   

## 2014-05-19 NOTE — Telephone Encounter (Signed)
Please call pt. Wednesday I forgot to send anything over about her elevated HR. I sent atenolol over today to pharmacy to take once a day.

## 2014-05-24 ENCOUNTER — Telehealth: Payer: Self-pay | Admitting: *Deleted

## 2014-05-24 NOTE — Telephone Encounter (Signed)
Pt left vm stating that the Cymbalta is going to cost her $116 & she is not going to be able to afford that.

## 2014-05-24 NOTE — Telephone Encounter (Signed)
[place] approved for duloxetine through [employer].  Approval dates are 05/24/14-05/25/15.

## 2014-05-26 NOTE — Telephone Encounter (Signed)
Ok let's try fetziema 20mg  give her sample pack and 1 month of samples. Print off rx for fetizema 40mg  daily #30 no refills. Give her coupon card. See if this is more affordable.

## 2014-06-16 ENCOUNTER — Ambulatory Visit: Payer: Managed Care, Other (non HMO) | Admitting: Physician Assistant

## 2014-06-21 ENCOUNTER — Ambulatory Visit (INDEPENDENT_AMBULATORY_CARE_PROVIDER_SITE_OTHER): Payer: Managed Care, Other (non HMO) | Admitting: Physician Assistant

## 2014-06-21 ENCOUNTER — Encounter: Payer: Self-pay | Admitting: Physician Assistant

## 2014-06-21 VITALS — BP 134/85 | HR 106 | Wt 211.0 lb

## 2014-06-21 DIAGNOSIS — E1165 Type 2 diabetes mellitus with hyperglycemia: Secondary | ICD-10-CM

## 2014-06-21 DIAGNOSIS — N898 Other specified noninflammatory disorders of vagina: Secondary | ICD-10-CM

## 2014-06-21 DIAGNOSIS — N760 Acute vaginitis: Secondary | ICD-10-CM

## 2014-06-21 DIAGNOSIS — IMO0002 Reserved for concepts with insufficient information to code with codable children: Secondary | ICD-10-CM

## 2014-06-21 DIAGNOSIS — IMO0001 Reserved for inherently not codable concepts without codable children: Secondary | ICD-10-CM

## 2014-06-21 DIAGNOSIS — R300 Dysuria: Secondary | ICD-10-CM

## 2014-06-21 DIAGNOSIS — E118 Type 2 diabetes mellitus with unspecified complications: Secondary | ICD-10-CM

## 2014-06-21 DIAGNOSIS — L293 Anogenital pruritus, unspecified: Secondary | ICD-10-CM

## 2014-06-21 LAB — POCT URINALYSIS DIPSTICK
Bilirubin, UA: NEGATIVE
Blood, UA: NEGATIVE
Glucose, UA: 500
KETONES UA: NEGATIVE
LEUKOCYTES UA: NEGATIVE
Nitrite, UA: NEGATIVE
PH UA: 5
PROTEIN UA: NEGATIVE
Spec Grav, UA: 1.02
Urobilinogen, UA: 0.2

## 2014-06-21 LAB — POCT GLYCOSYLATED HEMOGLOBIN (HGB A1C): Hemoglobin A1C: 8.3

## 2014-06-21 MED ORDER — ALBIGLUTIDE 30 MG ~~LOC~~ PEN: PEN_INJECTOR | SUBCUTANEOUS | Status: DC

## 2014-06-21 MED ORDER — FLUCONAZOLE 150 MG PO TABS: 150.0000 mg | ORAL_TABLET | Freq: Once | ORAL | Status: DC

## 2014-06-21 NOTE — Patient Instructions (Addendum)
Treated with diflucan.  Start with tanzeum weekly.  Follow up in 3 months.  Encouraged eye exam.

## 2014-06-23 LAB — WET PREP, GENITAL
CLUE CELLS WET PREP: NONE SEEN
Trich, Wet Prep: NONE SEEN
WBC, Wet Prep HPF POC: NONE SEEN
Yeast Wet Prep HPF POC: NONE SEEN

## 2014-06-26 NOTE — Progress Notes (Signed)
   Subjective:    Patient ID: Kathleen Le, female    DOB: [birth year], 53 y.o.   MRN: [id number]  HPI Pt presents to the clinic with vaginitis that is ongoing since starting invokana. She does get some minimal relief when we treat with diflucan but symptoms seem to come back. Very itching with some whitish thick discharge. No odor. Continues to use OTC monistat as well. Sex is painful.  There is some burning with urination. No abdominal, back pain. No fever, chills, n/v/d.   DM- she is not exercising and not eating right. Per pt eating lots of candy over the past 3 months. Denies any hypoglycemia. Denies any open wounds. She is taking her medication as directed.     Review of Systems  All other systems reviewed and are negative.      Objective:   Physical Exam  Constitutional: She is oriented to person, place, and time. She appears well-developed and well-nourished.  HENT:  Head: Normocephalic and atraumatic.  Cardiovascular: Normal rate, regular rhythm and normal heart sounds.   Pulmonary/Chest: Effort normal and breath sounds normal.  Neurological: She is alert and oriented to person, place, and time.  Skin: Skin is dry.  Psychiatric: She has a normal mood and affect. Her behavior is normal.          Assessment & Plan:  Vaginitis/dysuria- .. Results for orders placed in visit on 06/21/14  WET PREP, GENITAL      Result Value Ref Range   Yeast Wet Prep HPF POC NONE SEEN  NONE SEEN   Trich, Wet Prep NONE SEEN  NONE SEEN   Clue Cells Wet Prep HPF POC NONE SEEN  NONE SEEN   WBC, Wet Prep HPF POC NONE SEEN  NONE SEEN  POCT URINALYSIS DIPSTICK      Result Value Ref Range   Color, UA yellow     Clarity, UA clear     Glucose, UA 500     Bilirubin, UA neg     Ketones, UA neg     Spec Grav, UA 1.020     Blood, UA neg     pH, UA 5.0     Protein, UA neg     Urobilinogen, UA 0.2     Nitrite, UA neg     Leukocytes, UA Negative    POCT GLYCOSYLATED HEMOGLOBIN (HGB A1C)   Result Value Ref Range   Hemoglobin A1C 8.3     Pt was give one dose of diflucan. If symptoms persist need to consider estrogen vaginally for atrophy.   DM- 8.3 despite adding invokana. Discussed insulin as next step. Pt was very tearful. She really hates injections. Pt agreed to add back metformin/januvia/glipizide and to try tanzeum once weekly injection. Went through how to give injection. Gave first injection in office. Discussed side effects.  Gave rx and coupon. Discussed importance of diet and exercise. Follow up in 3 months. Needs eye exam.

## 2014-06-29 ENCOUNTER — Other Ambulatory Visit: Payer: Self-pay | Admitting: Physician Assistant

## 2014-07-14 ENCOUNTER — Other Ambulatory Visit: Payer: Self-pay | Admitting: *Deleted

## 2014-07-14 MED ORDER — HYDROCHLOROTHIAZIDE 25 MG PO TABS: ORAL_TABLET | ORAL | Status: DC

## 2014-08-07 ENCOUNTER — Other Ambulatory Visit: Payer: Self-pay | Admitting: Physician Assistant

## 2014-09-06 ENCOUNTER — Ambulatory Visit (INDEPENDENT_AMBULATORY_CARE_PROVIDER_SITE_OTHER): Payer: Managed Care, Other (non HMO) | Admitting: Family Medicine

## 2014-09-06 ENCOUNTER — Encounter: Payer: Self-pay | Admitting: Family Medicine

## 2014-09-06 VITALS — BP 114/74 | HR 122 | Wt 219.0 lb

## 2014-09-06 DIAGNOSIS — S20212A Contusion of left front wall of thorax, initial encounter: Secondary | ICD-10-CM

## 2014-09-06 DIAGNOSIS — L0390 Cellulitis, unspecified: Secondary | ICD-10-CM

## 2014-09-06 DIAGNOSIS — L0291 Cutaneous abscess, unspecified: Secondary | ICD-10-CM

## 2014-09-06 DIAGNOSIS — S20219A Contusion of unspecified front wall of thorax, initial encounter: Secondary | ICD-10-CM

## 2014-09-06 MED ORDER — CLINDAMYCIN HCL 300 MG PO CAPS: 300.0000 mg | ORAL_CAPSULE | Freq: Three times a day (TID) | ORAL | Status: DC

## 2014-09-06 MED ORDER — OXYCODONE-ACETAMINOPHEN 10-325 MG PO TABS: 1.0000 | ORAL_TABLET | Freq: Three times a day (TID) | ORAL | Status: DC | PRN
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
## 2014-09-12 ENCOUNTER — Telehealth: Payer: Self-pay | Admitting: *Deleted
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
## 2014-09-12 NOTE — Telephone Encounter (Signed)
Needs office visit for further evaluation if pain remains worsened, ICE and pain meds until then

## 2014-09-12 NOTE — Telephone Encounter (Signed)
Pt notified of Dr. Lajoyce Lauber recommendations.  Advised pt to come in & be seen if pain persists, gets worse, or her breathing changes.

## 2014-09-15 ENCOUNTER — Other Ambulatory Visit: Payer: Self-pay | Admitting: Physician Assistant

## 2014-09-21 ENCOUNTER — Other Ambulatory Visit: Payer: Self-pay | Admitting: Physician Assistant

## 2014-09-22 ENCOUNTER — Encounter: Payer: Self-pay | Admitting: Physician Assistant

## 2014-09-22 ENCOUNTER — Ambulatory Visit (INDEPENDENT_AMBULATORY_CARE_PROVIDER_SITE_OTHER): Payer: Managed Care, Other (non HMO) | Admitting: Physician Assistant

## 2014-09-22 ENCOUNTER — Other Ambulatory Visit: Payer: Self-pay | Admitting: Physician Assistant

## 2014-09-22 VITALS — BP 118/78 | HR 110 | Wt 220.0 lb

## 2014-09-22 DIAGNOSIS — IMO0002 Reserved for concepts with insufficient information to code with codable children: Secondary | ICD-10-CM

## 2014-09-22 DIAGNOSIS — S20212A Contusion of left front wall of thorax, initial encounter: Secondary | ICD-10-CM

## 2014-09-22 DIAGNOSIS — S20211D Contusion of right front wall of thorax, subsequent encounter: Secondary | ICD-10-CM

## 2014-09-22 DIAGNOSIS — E1165 Type 2 diabetes mellitus with hyperglycemia: Secondary | ICD-10-CM

## 2014-09-22 DIAGNOSIS — S81809A Unspecified open wound, unspecified lower leg, initial encounter: Secondary | ICD-10-CM

## 2014-09-22 DIAGNOSIS — IMO0001 Reserved for inherently not codable concepts without codable children: Secondary | ICD-10-CM

## 2014-09-22 DIAGNOSIS — S81801D Unspecified open wound, right lower leg, subsequent encounter: Secondary | ICD-10-CM

## 2014-09-22 DIAGNOSIS — F4310 Post-traumatic stress disorder, unspecified: Secondary | ICD-10-CM

## 2014-09-22 DIAGNOSIS — E118 Type 2 diabetes mellitus with unspecified complications: Secondary | ICD-10-CM

## 2014-09-22 LAB — POCT GLYCOSYLATED HEMOGLOBIN (HGB A1C): Hemoglobin A1C: 7.6

## 2014-09-22 MED ORDER — OXYCODONE-ACETAMINOPHEN 10-325 MG PO TABS: 1.0000 | ORAL_TABLET | Freq: Three times a day (TID) | ORAL | Status: DC | PRN

## 2014-09-22 MED ORDER — SILVER SULFADIAZINE 1 % EX CREA: 1.0000 "application " | TOPICAL_CREAM | Freq: Every day | CUTANEOUS | Status: DC
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
## 2014-09-26 DIAGNOSIS — S299XXA Unspecified injury of thorax, initial encounter: Secondary | ICD-10-CM | POA: Insufficient documentation

## 2014-09-26 DIAGNOSIS — S20219A Contusion of unspecified front wall of thorax, initial encounter: Secondary | ICD-10-CM | POA: Insufficient documentation

## 2014-09-26 DIAGNOSIS — S81801A Unspecified open wound, right lower leg, initial encounter: Secondary | ICD-10-CM | POA: Insufficient documentation

## 2014-09-26 DIAGNOSIS — F4310 Post-traumatic stress disorder, unspecified: Secondary | ICD-10-CM

## 2014-09-26 HISTORY — DX: Post-traumatic stress disorder, unspecified: F43.10

## 2014-09-26 HISTORY — DX: Unspecified injury of thorax, initial encounter: S29.9XXA

## 2014-09-26 MED ORDER — ALBIGLUTIDE 30 MG ~~LOC~~ PEN: PEN_INJECTOR | SUBCUTANEOUS | Status: DC

## 2014-10-02 ENCOUNTER — Encounter: Payer: Self-pay | Admitting: Physician Assistant

## 2014-10-02 ENCOUNTER — Ambulatory Visit: Payer: Managed Care, Other (non HMO)

## 2014-10-02 ENCOUNTER — Other Ambulatory Visit: Payer: Self-pay | Admitting: Physician Assistant

## 2014-10-02 ENCOUNTER — Ambulatory Visit (INDEPENDENT_AMBULATORY_CARE_PROVIDER_SITE_OTHER): Payer: Managed Care, Other (non HMO) | Admitting: Physician Assistant

## 2014-10-02 VITALS — BP 128/77 | HR 113 | Wt 221.0 lb

## 2014-10-02 DIAGNOSIS — M7989 Other specified soft tissue disorders: Secondary | ICD-10-CM

## 2014-10-02 DIAGNOSIS — M79661 Pain in right lower leg: Secondary | ICD-10-CM

## 2014-10-02 DIAGNOSIS — M79604 Pain in right leg: Secondary | ICD-10-CM

## 2014-10-02 DIAGNOSIS — S81801D Unspecified open wound, right lower leg, subsequent encounter: Secondary | ICD-10-CM

## 2014-10-02 DIAGNOSIS — L84 Corns and callosities: Secondary | ICD-10-CM

## 2014-10-02 DIAGNOSIS — F4310 Post-traumatic stress disorder, unspecified: Secondary | ICD-10-CM

## 2014-10-02 IMAGING — US US EXTREM LOW VENOUS*R*
1 series · 13 of 21 positions shown · non-contrast
Comparison: None.

CLINICAL DATA: Acute right lower leg pain and swelling



[Series 1: us extrem low venous*right* · 0.06mm/px · 13 of 21 slices shown]
[im 1/21]
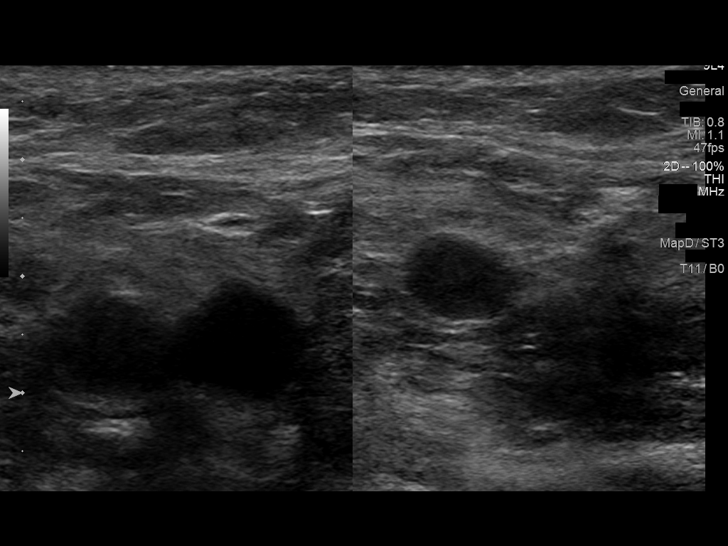
[im 3/21]
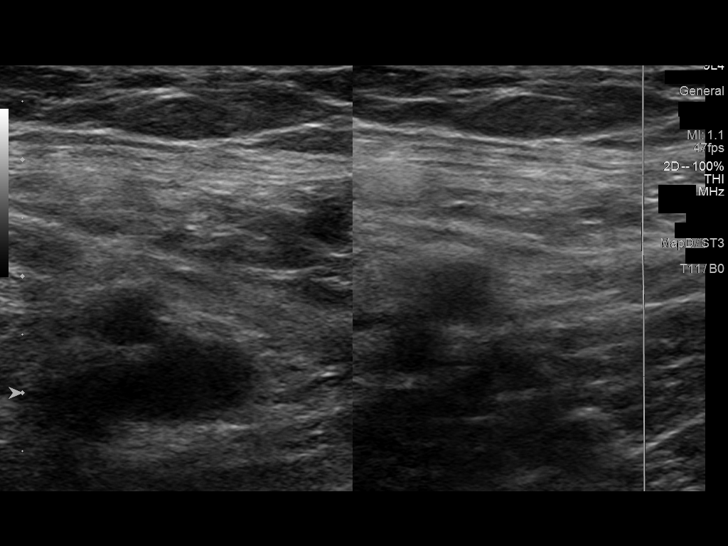
[im 5/21]
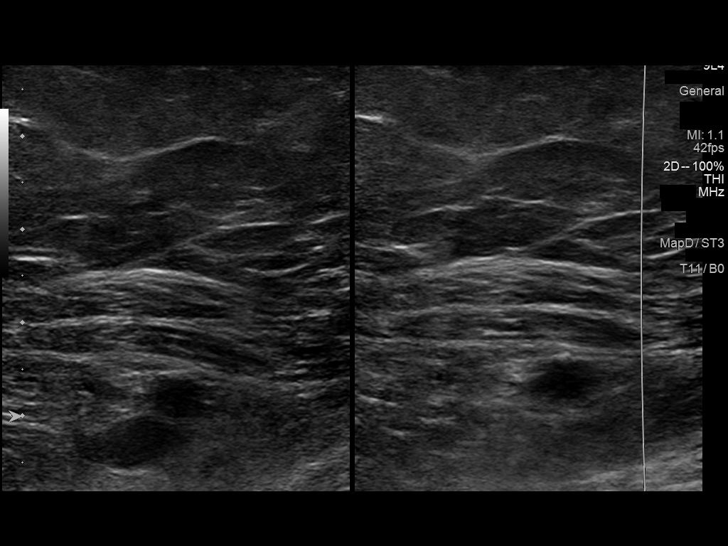
[im 6/21]
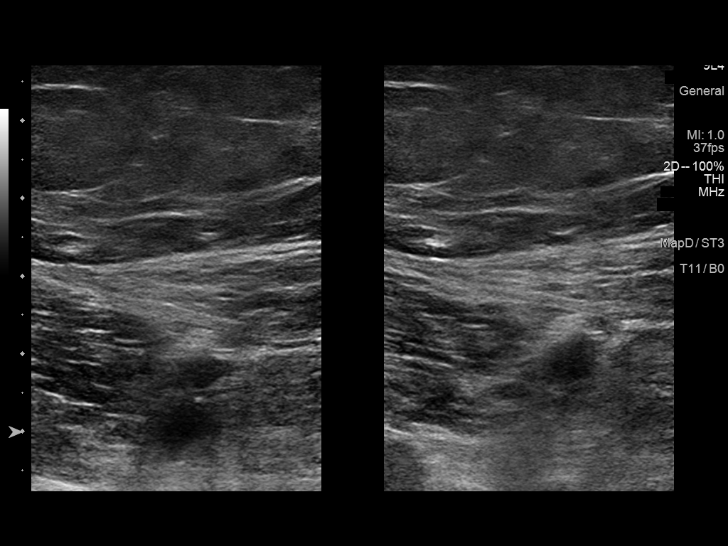
[im 8/21]
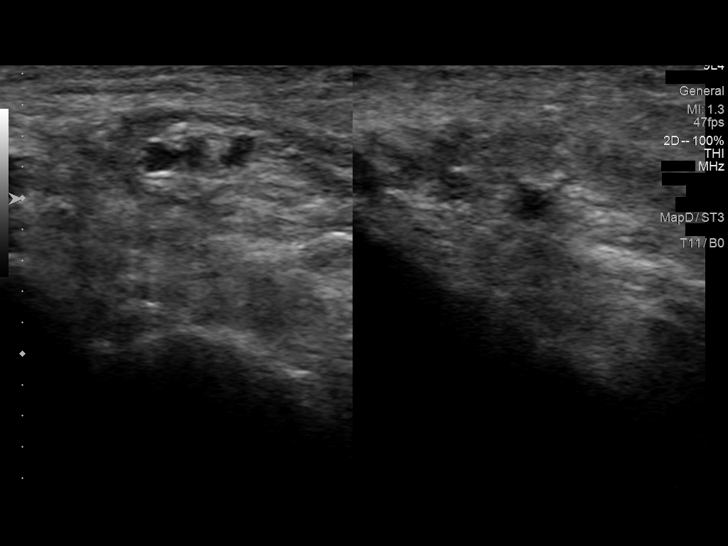
[im 9/21]
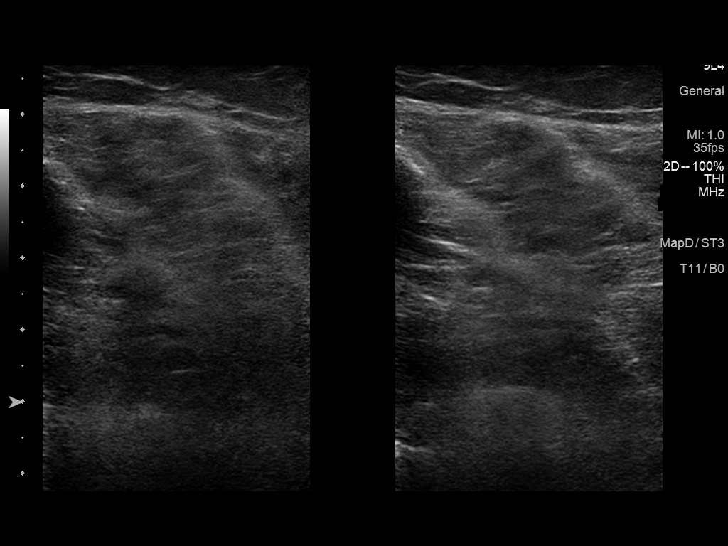
[im 11/21]
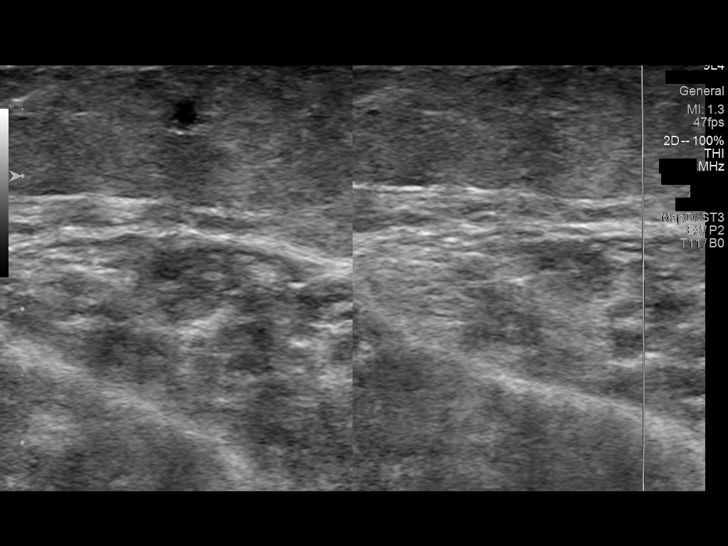
[im 13/21]
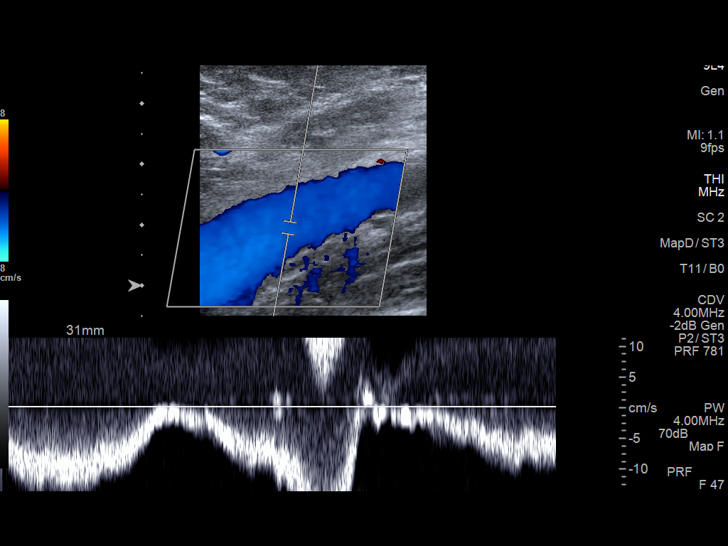
[im 14/21]
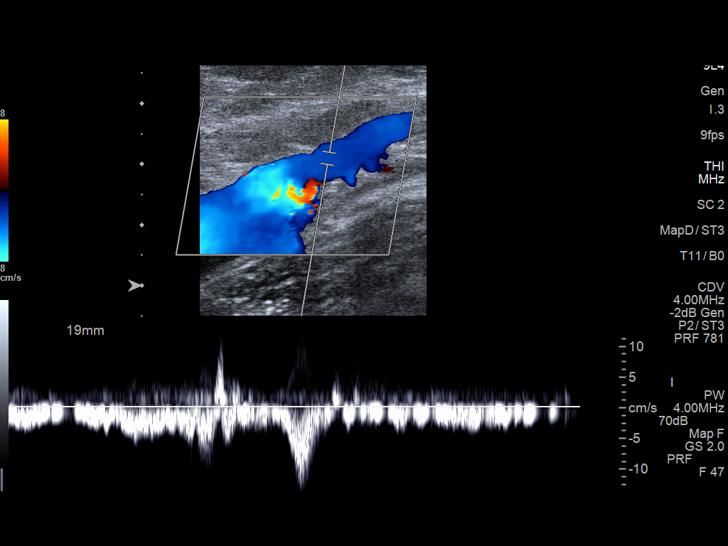
[im 16/21]
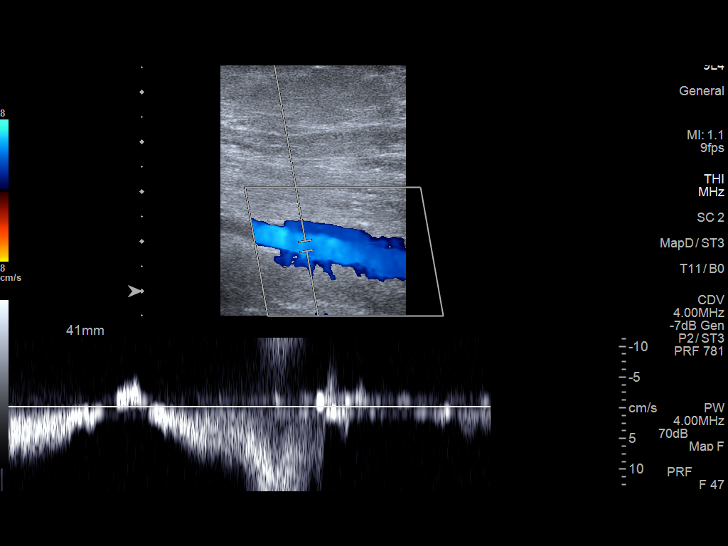
[im 17/21]
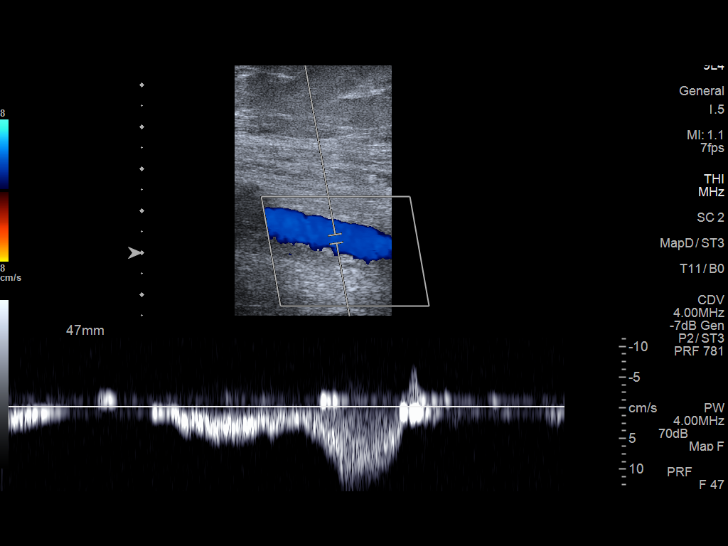
[im 19/21]
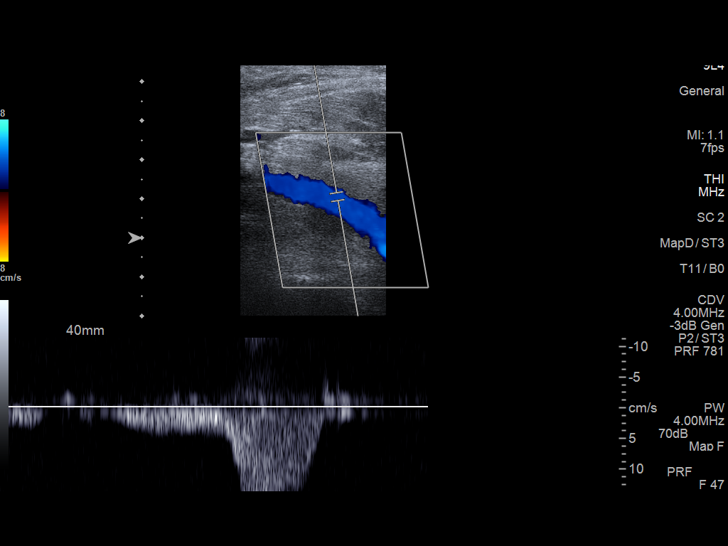
[im 21/21]
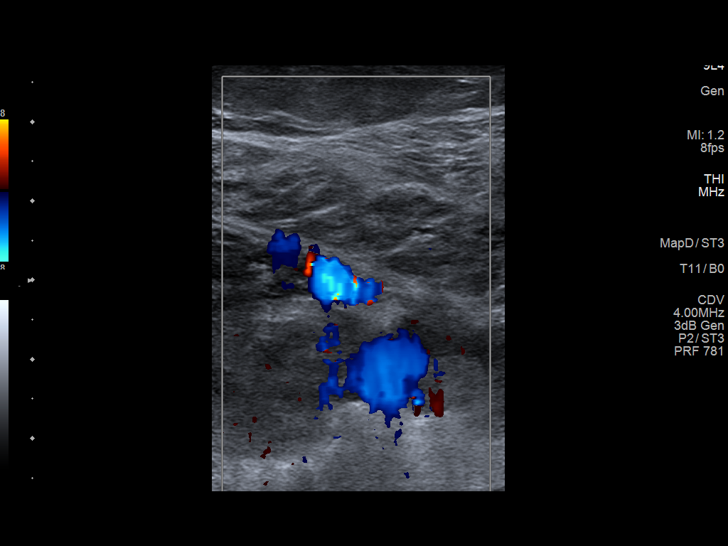

[13 of 21 positions shown; findings below may reference images not displayed]

FINDINGS: Common Femoral Vein: No evidence of thrombus. Normal
compressibility, respiratory phasicity and response to augmentation.

Saphenofemoral Junction: No evidence of thrombus. Normal
compressibility and flow on color Doppler imaging.

Profunda Femoral Vein: No evidence of thrombus. Normal
compressibility and flow on color Doppler imaging.

Femoral Vein: No evidence of thrombus. Normal compressibility,
respiratory phasicity and response to augmentation.

Popliteal Vein: No evidence of thrombus. Normal compressibility,
respiratory phasicity and response to augmentation.

Calf Veins: No evidence of thrombus. Normal compressibility and flow
on color Doppler imaging.

Superficial Great Saphenous Vein: No evidence of thrombus. Normal
compressibility and flow on color Doppler imaging.

Venous Reflux:  None.

Other Findings:  None.
IMPRESSION: No evidence of deep venous thrombosis.

## 2014-10-02 MED ORDER — CLONAZEPAM 0.5 MG PO TABS: 0.5000 mg | ORAL_TABLET | Freq: Two times a day (BID) | ORAL | Status: DC | PRN

## 2014-10-02 NOTE — Patient Instructions (Addendum)
Vitamin E for scar.  klonapin for anxiety as needed.  Will send to podiatry.  Will get venous doppler for right leg.  Will give salacyclic acid.
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
## 2014-10-03 DIAGNOSIS — L84 Corns and callosities: Secondary | ICD-10-CM | POA: Insufficient documentation

## 2014-10-03 HISTORY — DX: Corns and callosities: L84

## 2014-10-03 MED ORDER — SALICYLIC ACID 26 % EX LIQD: CUTANEOUS | Status: DC

## 2014-10-19 ENCOUNTER — Other Ambulatory Visit: Payer: Self-pay | Admitting: Physician Assistant

## 2014-10-24 ENCOUNTER — Telehealth: Payer: Self-pay

## 2014-10-24 NOTE — Telephone Encounter (Signed)
Left message for patient to call back. She is due for a diabetic eye exam.

## 2014-10-26 ENCOUNTER — Other Ambulatory Visit: Payer: Self-pay | Admitting: Physician Assistant

## 2014-10-27 ENCOUNTER — Other Ambulatory Visit: Payer: Self-pay | Admitting: *Deleted

## 2014-10-27 ENCOUNTER — Other Ambulatory Visit: Payer: Self-pay | Admitting: Physician Assistant

## 2014-10-27 MED ORDER — HYDROCHLOROTHIAZIDE 25 MG PO TABS: ORAL_TABLET | ORAL | Status: DC

## 2014-11-20 ENCOUNTER — Other Ambulatory Visit: Payer: Self-pay | Admitting: Physician Assistant

## 2014-12-25 ENCOUNTER — Other Ambulatory Visit: Payer: Self-pay | Admitting: Physician Assistant

## 2015-01-10 ENCOUNTER — Encounter: Payer: Self-pay | Admitting: Physician Assistant

## 2015-01-10 ENCOUNTER — Ambulatory Visit (INDEPENDENT_AMBULATORY_CARE_PROVIDER_SITE_OTHER): Payer: Managed Care, Other (non HMO) | Admitting: Physician Assistant

## 2015-01-10 VITALS — BP 124/70 | HR 96 | Wt 226.0 lb

## 2015-01-10 DIAGNOSIS — I10 Essential (primary) hypertension: Secondary | ICD-10-CM

## 2015-01-10 DIAGNOSIS — K2100 Gastro-esophageal reflux disease with esophagitis, without bleeding: Secondary | ICD-10-CM

## 2015-01-10 DIAGNOSIS — J0100 Acute maxillary sinusitis, unspecified: Secondary | ICD-10-CM

## 2015-01-10 DIAGNOSIS — R05 Cough: Secondary | ICD-10-CM

## 2015-01-10 DIAGNOSIS — R059 Cough, unspecified: Secondary | ICD-10-CM

## 2015-01-10 HISTORY — DX: Gastro-esophageal reflux disease with esophagitis, without bleeding: K21.00

## 2015-01-10 MED ORDER — ESOMEPRAZOLE MAGNESIUM 40 MG PO PACK: 40.0000 mg | PACK | Freq: Every day | ORAL | Status: DC

## 2015-01-10 MED ORDER — AMOXICILLIN-POT CLAVULANATE 875-125 MG PO TABS: 1.0000 | ORAL_TABLET | Freq: Two times a day (BID) | ORAL | Status: DC

## 2015-01-10 MED ORDER — ATENOLOL 25 MG PO TABS: ORAL_TABLET | ORAL | Status: DC

## 2015-01-10 MED ORDER — ESOMEPRAZOLE MAGNESIUM 40 MG PO CPDR: 40.0000 mg | DELAYED_RELEASE_CAPSULE | ORAL | Status: DC

## 2015-01-10 MED ORDER — GUAIFENESIN-CODEINE 100-10 MG/5ML PO SYRP: 5.0000 mL | ORAL_SOLUTION | Freq: Three times a day (TID) | ORAL | Status: DC | PRN

## 2015-01-10 NOTE — Progress Notes (Signed)
   Subjective:    Patient ID: Kathleen Le, female    DOB: 1961-07-08, 54 y.o.   MRN: [id number]  HPI Patient presents to the clinic with sinus pressure and cough. The cough is her worst symptom. His been present for over 2 weeks. She's has a lot of ear pressure and congestion. She's been taking Mucinex for the last 2 weeks with minimal improvement. She coughs worse when she lays down at night. She also does admit to worsening acid reflux. She is having a lot of burning and even to the point of vomiting when she lays down at night. She denies any fever, chills, nausea or wheezing.  Hypertension-patient is doing well with no concerns. She denies any chest pains, palpitations, headaches or vision changes. She takes her medications daily.   Review of Systems  All other systems reviewed and are negative.      Objective:   Physical Exam  Constitutional: She is oriented to person, place, and time. She appears well-developed and well-nourished.  HENT:  Head: Normocephalic and atraumatic.  Right Ear: External ear normal.  Left Ear: External ear normal.  TMs clear bilaterally.  Oropharynx slightly erythematous with some postnasal drip present.  Bilateral nasal turbinates red and swollen.  Eyes: Conjunctivae are normal. Right eye exhibits no discharge. Left eye exhibits no discharge.  Neck: Normal range of motion. Neck supple.  Cardiovascular: Normal rate, regular rhythm and normal heart sounds.   Pulmonary/Chest: Effort normal and breath sounds normal. She has no wheezes.  Lymphadenopathy:    She has no cervical adenopathy.  Neurological: She is alert and oriented to person, place, and time.  Skin: Skin is dry.  Psychiatric: She has a normal mood and affect.          Assessment & Plan:  Acute maxillary sinusitis-went ahead and treated with Augmentin for 10 days. Other symptomatic care was given. Discussed nasal saline rinses. Follow-up as needed.  Cough/GERD-filling the cough could  be multifactorial. She has had some worsening acid reflux. She is not on any type of acid suppressant. Restarted Nexium 40 mg daily to see if this could help with cough and acid reflux symptoms. Discussed the GERD diet. Cough also could be coming from some postnasal drip from sinusitis. Guaifenesin/codeine was given to use up to 3 times a day. Follow up if cough not resolving in the next 2-3 weeks.  Hypertension- patient needs a refill today on her atenolol. She was refilled for 6 months. Patient is doing well and well controlled.  Did discuss with patient is time for complete physical. Please schedule in the next couple months.

## 2015-01-11 ENCOUNTER — Telehealth: Payer: Self-pay | Admitting: *Deleted

## 2015-01-11 NOTE — Telephone Encounter (Signed)
Prior auth initiated for tanzeum in cover my meds - awaiting decision.

## 2015-01-12 NOTE — Telephone Encounter (Signed)
Patient's husband states she is looking into getting a diabetic eye exam.

## 2015-01-16 NOTE — Telephone Encounter (Signed)
Tanzeum denial received given to [facility].

## 2015-01-17 NOTE — Telephone Encounter (Signed)
Kathleen Le was called and she is going to come by the office and pick up the 2 boxes of tanzeum we have available. I am going to call Claiborne Billings our rep to see if she is able to help with the savings card.

## 2015-01-23 ENCOUNTER — Telehealth: Payer: Self-pay | Admitting: *Deleted

## 2015-01-24 ENCOUNTER — Other Ambulatory Visit (HOSPITAL_COMMUNITY)
Admission: RE | Admit: 2015-01-24 | Discharge: 2015-01-24 | Disposition: A | Discharge status: Home / Self Care | Payer: Managed Care, Other (non HMO) | Source: Ambulatory Visit | Attending: Physician Assistant | Admitting: Physician Assistant

## 2015-01-24 ENCOUNTER — Encounter: Payer: Self-pay | Admitting: Physician Assistant

## 2015-01-24 ENCOUNTER — Ambulatory Visit (INDEPENDENT_AMBULATORY_CARE_PROVIDER_SITE_OTHER): Payer: Managed Care, Other (non HMO) | Admitting: Physician Assistant

## 2015-01-24 VITALS — BP 147/89 | HR 121 | Wt 230.0 lb

## 2015-01-24 DIAGNOSIS — Z01419 Encounter for gynecological examination (general) (routine) without abnormal findings: Secondary | ICD-10-CM

## 2015-01-24 DIAGNOSIS — H6123 Impacted cerumen, bilateral: Secondary | ICD-10-CM

## 2015-01-24 DIAGNOSIS — Z0000 Encounter for general adult medical examination without abnormal findings: Secondary | ICD-10-CM

## 2015-01-24 DIAGNOSIS — Z01411 Encounter for gynecological examination (general) (routine) with abnormal findings: Secondary | ICD-10-CM | POA: Diagnosis present

## 2015-01-24 DIAGNOSIS — Z1151 Encounter for screening for human papillomavirus (HPV): Secondary | ICD-10-CM | POA: Diagnosis present

## 2015-01-24 DIAGNOSIS — H6120 Impacted cerumen, unspecified ear: Secondary | ICD-10-CM

## 2015-01-24 DIAGNOSIS — E785 Hyperlipidemia, unspecified: Secondary | ICD-10-CM

## 2015-01-24 DIAGNOSIS — N760 Acute vaginitis: Secondary | ICD-10-CM | POA: Diagnosis present

## 2015-01-24 DIAGNOSIS — R81 Glycosuria: Secondary | ICD-10-CM

## 2015-01-24 DIAGNOSIS — E118 Type 2 diabetes mellitus with unspecified complications: Secondary | ICD-10-CM

## 2015-01-24 DIAGNOSIS — I10 Essential (primary) hypertension: Secondary | ICD-10-CM

## 2015-01-24 DIAGNOSIS — R300 Dysuria: Secondary | ICD-10-CM

## 2015-01-24 HISTORY — DX: Impacted cerumen, unspecified ear: H61.20

## 2015-01-24 HISTORY — DX: Hyperlipidemia, unspecified: E78.5

## 2015-01-24 LAB — POCT URINALYSIS DIPSTICK
Bilirubin, UA: NEGATIVE
Blood, UA: NEGATIVE
Glucose, UA: 500
Ketones, UA: NEGATIVE
Leukocytes, UA: NEGATIVE
Nitrite, UA: NEGATIVE
Protein, UA: NEGATIVE
SPEC GRAV UA: 1.025
Urobilinogen, UA: 0.2
pH, UA: 5

## 2015-01-24 LAB — POCT GLYCOSYLATED HEMOGLOBIN (HGB A1C): Hemoglobin A1C: 9.4

## 2015-01-24 MED ORDER — DULAGLUTIDE 0.75 MG/0.5ML ~~LOC~~ SOAJ: 0.7500 mg | SUBCUTANEOUS | Status: DC

## 2015-01-24 NOTE — Progress Notes (Signed)
See After Visit Summary for Counseling Recommendations   Subjective:     Kathleen Le is a 54 y.o. female and is here for a comprehensive physical exam. The patient reports problems - some occasional dysuria. not bad and not every urination. no fever, chills, abdominal pain, flank pain. not tried anything to make better. [name]  History   Social History  . Marital Status: Married    Spouse Name: N/A    Number of Children: N/A  . Years of Education: N/A   Occupational History  . Not on file.   Social History Main Topics  . Smoking status: Current Every Day Smoker  . Smokeless tobacco: Not on file  . Alcohol Use: No  . Drug Use: No  . Sexual Activity: Yes   Other Topics Concern  . Not on file   Social History Narrative   Health Maintenance  Topic Date Due  . OPHTHALMOLOGY EXAM  02/06/1973  . INFLUENZA VACCINE  07/25/2015 (Originally 07/29/2014)  . FOOT EXAM  03/04/2015  . URINE MICROALBUMIN  03/04/2015  . HEMOGLOBIN A1C  04/25/2015  . MAMMOGRAM  03/01/2016  . PAP SMEAR  01/24/2018  . PNEUMOCOCCAL POLYSACCHARIDE VACCINE (2) 02/03/2019  . TETANUS/TDAP  02/04/2024  . COLONOSCOPY  03/08/2024    The following portions of the patient's history were reviewed and updated as appropriate: allergies, current medications, past family history, past medical history, past social history, past surgical history and problem list.  Review of Systems A comprehensive review of systems was negative.   Objective:    BP 147/89 mmHg  Pulse 121  Wt 230 lb (104.327 kg)  SpO2 97% General appearance: alert, cooperative, appears stated age and moderately obese Head: Normocephalic, without obvious abnormality, atraumatic Eyes: conjunctivae/corneas clear. PERRL, EOM's intact. Fundi benign. Ears: normal TM's and external ear canals both ears Nose: Nares normal. Septum midline. Mucosa normal. No drainage or sinus tenderness. Throat: lips, mucosa, and tongue normal; teeth and gums normal Neck: no  adenopathy, no carotid bruit, no JVD, supple, symmetrical, trachea midline and thyroid not enlarged, symmetric, no tenderness/mass/nodules Back: symmetric, no curvature. ROM normal. No CVA tenderness. Lungs: clear to auscultation bilaterally Heart: regular rate and rhythm, S1, S2 normal, no murmur, click, rub or gallop Abdomen: soft, non-tender; bowel sounds normal; no masses,  no organomegaly Pelvic: cervix normal in appearance, external genitalia normal, no adnexal masses or tenderness, no cervical motion tenderness, uterus normal size, shape, and consistency and vagina normal without discharge Extremities: extremities normal, atraumatic, no cyanosis or edema Pulses: 2+ and symmetric Skin: Skin color, texture, turgor normal. No rashes or lesions Lymph nodes: Cervical, supraclavicular, and axillary nodes normal. Neurologic: Grossly normal    Assessment:    Healthy female exam.      Plan:    CPE- pap done today. Colonoscopy done. Immunizations up to date. Mammogram up to date will get in march. Fasting labs ordered. Discussed calcium and vitamin D 1200mg  and 800mg .   Dysuria- see A1C and DM. I feel like due to excess glucose in urine. Negative for blood, leuks and nitrates. Will culture to confirm.   DM, type II- .[name] Lab Results  Component Value Date   HGBA1C 9.4 01/24/2015   Much worse than last time. Long discussion about need for control. Pt admits to drinking 2-3 sodas a day and not watching what she eats.  Pt admits forgets medications as well.  Restart all orals metformin, glipizide, and januvia. Weekly tanziuem shots. Given samples. Not able to get approved via  insurance. Try trulicity weekly injections. Given rx.  Stop sodas. If not improving may need to go to basal insulin.  Will avoid GLP-2 due to hx of yeast infections when tried invokana.  Pneumonia up to date.  Pt declined flu shot.  Needs eye exam. Pt aware.   HTN/tachycardia- did not take medications today. Refilled.  Start back and check at pharmacy. Under 130/90 and 100 pulse. Please follow up nurse visit in one month to check.   Bilateral cerumen impaction- resolved after nurse irrigation. Discuss prevention with debrox OTC.    See After Visit Summary for Counseling Recommendations

## 2015-01-24 NOTE — Patient Instructions (Signed)

## 2015-01-26 LAB — CYTOLOGY - PAP

## 2015-01-26 LAB — CERVICOVAGINAL ANCILLARY ONLY
Bacterial vaginitis: NEGATIVE
CANDIDA VAGINITIS: POSITIVE — AB

## 2015-01-26 LAB — URINE CULTURE
Colony Count: NO GROWTH
Organism ID, Bacteria: NO GROWTH

## 2015-01-26 MED ORDER — FLUCONAZOLE 150 MG PO TABS: 150.0000 mg | ORAL_TABLET | Freq: Once | ORAL | Status: DC

## 2015-01-26 NOTE — Addendum Note (Signed)
Addended by: Narda Rutherford on: 01/26/2015 06:09 PM   Modules accepted: Orders

## 2015-02-01 ENCOUNTER — Telehealth: Payer: Self-pay | Admitting: *Deleted

## 2015-02-01 ENCOUNTER — Other Ambulatory Visit: Payer: Self-pay | Admitting: *Deleted

## 2015-02-01 DIAGNOSIS — Z8541 Personal history of malignant neoplasm of cervix uteri: Secondary | ICD-10-CM

## 2015-02-01 DIAGNOSIS — R87619 Unspecified abnormal cytological findings in specimens from cervix uteri: Secondary | ICD-10-CM

## 2015-02-01 MED ORDER — GLIPIZIDE ER 10 MG PO TB24: 10.0000 mg | ORAL_TABLET | Freq: Every day | ORAL | Status: DC

## 2015-02-01 NOTE — Telephone Encounter (Signed)
Referral placed.

## 2015-02-02 ENCOUNTER — Encounter: Payer: Self-pay | Admitting: Obstetrics & Gynecology

## 2015-02-04 ENCOUNTER — Other Ambulatory Visit: Payer: Self-pay | Admitting: Physician Assistant

## 2015-02-05 NOTE — Telephone Encounter (Signed)
Due for f/u nurse visit to check BP

## 2015-02-07 ENCOUNTER — Telehealth: Payer: Self-pay

## 2015-02-07 NOTE — Telephone Encounter (Signed)
On 01/24/15 Amber gave [name] rest of Tanzeum samples we had and [name] wrote a prescription for Trulicity. [name] will start Trulicity when she runs out of the Tanzeum because insurance will not approve. - CF

## 2015-03-05 ENCOUNTER — Other Ambulatory Visit: Payer: Self-pay | Admitting: Physician Assistant

## 2015-03-05 ENCOUNTER — Other Ambulatory Visit: Payer: Self-pay | Admitting: Family Medicine

## 2015-03-07 ENCOUNTER — Telehealth: Payer: Self-pay

## 2015-03-07 ENCOUNTER — Encounter: Payer: Managed Care, Other (non HMO) | Admitting: Obstetrics & Gynecology

## 2015-03-07 NOTE — Telephone Encounter (Signed)
Patient is aware. Per Dr. Verita Schneiders patient do not need colpo appointment due to HPV is negative. Repeat pap in 1 year. Patient can schedule with our office or schedule with [facility].

## 2015-03-08 ENCOUNTER — Encounter: Payer: Managed Care, Other (non HMO) | Admitting: Obstetrics & Gynecology

## 2015-03-08 NOTE — Telephone Encounter (Signed)
Mailed letter °

## 2015-03-19 ENCOUNTER — Telehealth: Payer: Self-pay | Admitting: Physician Assistant

## 2015-03-19 NOTE — Telephone Encounter (Signed)
Contacted Pt to see if she has had an eye exam completed within the last year. No answer, left message for Pt to return clinic call and let us know if she has had one completed or has one scheduled. Callback information provided.

## 2015-03-21 ENCOUNTER — Other Ambulatory Visit: Payer: Self-pay | Admitting: Physician Assistant

## 2015-03-21 MED ORDER — ALBIGLUTIDE 30 MG ~~LOC~~ PEN: PEN_INJECTOR | SUBCUTANEOUS | Status: DC

## 2015-03-28 ENCOUNTER — Other Ambulatory Visit: Payer: Self-pay | Admitting: Physician Assistant

## 2015-04-02 ENCOUNTER — Telehealth: Payer: Self-pay | Admitting: *Deleted

## 2015-04-02 NOTE — Telephone Encounter (Signed)
On 4/1 [name] Plan " patient advocate" [phone]. Called in for [name] to see if  Her Cymbalta had been delivered here @ office. Called patient Kathleen Le 4/4 left VM that her Cymbalta is here @ the office

## 2015-04-10 ENCOUNTER — Other Ambulatory Visit: Payer: Self-pay | Admitting: *Deleted

## 2015-04-10 MED ORDER — ALBIGLUTIDE 30 MG ~~LOC~~ PEN: 30.0000 [IU] | PEN_INJECTOR | SUBCUTANEOUS | Status: DC

## 2015-04-10 MED ORDER — TANZEUM 30 MG ~~LOC~~ PEN: 30.0000 [IU] | PEN_INJECTOR | SUBCUTANEOUS | Status: DC

## 2015-04-10 MED ORDER — ALBIGLUTIDE 30 MG ~~LOC~~ PEN: PEN_INJECTOR | SUBCUTANEOUS | Status: DC

## 2015-04-12 ENCOUNTER — Telehealth: Payer: Self-pay | Admitting: *Deleted

## 2015-04-12 NOTE — Telephone Encounter (Signed)
Patient called in asking for samples. Kathleen Le thinks it's for the Tanzeum. ** Also requesting a letter to excuse her from jury duty. She said the other Doctor use to write one due to her cough so continuous that she wouldn't be able  sit without coughing throughout.

## 2015-04-13 ENCOUNTER — Encounter: Payer: Self-pay | Admitting: *Deleted

## 2015-04-13 NOTE — Telephone Encounter (Signed)
[place] for jury duty note for dry cough from asthma and GERD thatcould cause interruption in court.

## 2015-04-25 ENCOUNTER — Ambulatory Visit: Payer: Managed Care, Other (non HMO) | Admitting: Physician Assistant

## 2015-05-05 ENCOUNTER — Other Ambulatory Visit: Payer: Self-pay | Admitting: Physician Assistant

## 2015-05-10 ENCOUNTER — Other Ambulatory Visit: Payer: Self-pay | Admitting: Physician Assistant

## 2015-05-17 ENCOUNTER — Other Ambulatory Visit: Payer: Self-pay | Admitting: Physician Assistant

## 2015-05-24 ENCOUNTER — Telehealth: Payer: Self-pay

## 2015-05-24 ENCOUNTER — Other Ambulatory Visit: Payer: Self-pay | Admitting: *Deleted

## 2015-05-24 DIAGNOSIS — J45909 Unspecified asthma, uncomplicated: Secondary | ICD-10-CM

## 2015-05-24 NOTE — Telephone Encounter (Signed)
Pt called requesting for [place] to fill out a form for Rx helper for proair inhaler. We have never prescribed this to her. She says that she was getting it from her previous provider. From what I could see we have never directly addressed asthma at any of her office visits. Says that she has been taking her mother-in-laws proair to get by. Please advise.

## 2015-05-25 MED ORDER — ALBUTEROL SULFATE HFA 108 (90 BASE) MCG/ACT IN AERS: 2.0000 | INHALATION_SPRAY | Freq: Four times a day (QID) | RESPIRATORY_TRACT | Status: DC | PRN

## 2015-05-25 NOTE — Telephone Encounter (Signed)
Ok to fill for one but then needs appt

## 2015-05-25 NOTE — Telephone Encounter (Signed)
Prescription sent

## 2015-05-31 ENCOUNTER — Other Ambulatory Visit: Payer: Self-pay | Admitting: Physician Assistant

## 2015-06-07 ENCOUNTER — Other Ambulatory Visit: Payer: Self-pay | Admitting: Physician Assistant

## 2015-06-07 ENCOUNTER — Ambulatory Visit (INDEPENDENT_AMBULATORY_CARE_PROVIDER_SITE_OTHER): Payer: Commercial Managed Care - PPO | Admitting: Family Medicine

## 2015-06-07 ENCOUNTER — Encounter: Payer: Self-pay | Admitting: Family Medicine

## 2015-06-07 ENCOUNTER — Telehealth: Payer: Self-pay | Admitting: *Deleted

## 2015-06-07 VITALS — BP 127/81 | HR 113 | Wt 231.0 lb

## 2015-06-07 DIAGNOSIS — R000 Tachycardia, unspecified: Secondary | ICD-10-CM

## 2015-06-07 DIAGNOSIS — E1165 Type 2 diabetes mellitus with hyperglycemia: Secondary | ICD-10-CM

## 2015-06-07 DIAGNOSIS — K1379 Other lesions of oral mucosa: Secondary | ICD-10-CM | POA: Diagnosis not present

## 2015-06-07 DIAGNOSIS — I10 Essential (primary) hypertension: Secondary | ICD-10-CM

## 2015-06-07 DIAGNOSIS — IMO0002 Reserved for concepts with insufficient information to code with codable children: Secondary | ICD-10-CM

## 2015-06-07 DIAGNOSIS — B3789 Other sites of candidiasis: Secondary | ICD-10-CM

## 2015-06-07 DIAGNOSIS — J45909 Unspecified asthma, uncomplicated: Secondary | ICD-10-CM

## 2015-06-07 LAB — POCT GLYCOSYLATED HEMOGLOBIN (HGB A1C): Hemoglobin A1C: 9.7

## 2015-06-07 MED ORDER — PREGABALIN 75 MG PO CAPS: 75.0000 mg | ORAL_CAPSULE | Freq: Two times a day (BID) | ORAL | Status: DC

## 2015-06-07 MED ORDER — ATENOLOL 50 MG PO TABS
ORAL_TABLET | ORAL | Status: DC
Start: 2015-06-07 — End: 2015-06-29

## 2015-06-07 MED ORDER — ALBUTEROL SULFATE HFA 108 (90 BASE) MCG/ACT IN AERS: 2.0000 | INHALATION_SPRAY | Freq: Four times a day (QID) | RESPIRATORY_TRACT | Status: DC | PRN

## 2015-06-07 MED ORDER — NYSTATIN 100000 UNIT/ML MT SUSP: 5.0000 mL | Freq: Four times a day (QID) | OROMUCOSAL | Status: DC

## 2015-06-07 MED ORDER — NYSTATIN 100000 UNIT/GM EX POWD: CUTANEOUS | Status: DC

## 2015-06-07 MED ORDER — PIOGLITAZONE HCL 30 MG PO TABS: 30.0000 mg | ORAL_TABLET | Freq: Every day | ORAL | Status: DC

## 2015-06-07 MED ORDER — HYDROCHLOROTHIAZIDE 25 MG PO TABS: 25.0000 mg | ORAL_TABLET | Freq: Every day | ORAL | Status: DC

## 2015-06-07 NOTE — Telephone Encounter (Signed)
Insurance is not going to cover nystatin and lyrica . She is going to drop off the formulary with what her insurance will cover.

## 2015-06-07 NOTE — Progress Notes (Signed)
CC: Kathleen Le is a 54 y.o. female is here for Diabetes   Subjective: HPI:  Follow-up type 2 diabetes: No outside blood sugars to report she's been taking glipizide, metformin, Januvia and tanzeum on a daily basis as prescribed. She's had dry mouth and recurrent rashes underneath her breasts and abdomen skin folds. These spots are itchy and do not respond to dry bond.  She denies polyuria or polydipsia. She's cut back on sodas and cakes. No formal exercise routine.  Complains of burning on the bottom of her feet whenever resting that is slightly improved with activity but always seems to be mild-to-moderate in severity. There's been no overlying skin changes and it also sometimes involves the dorsal surface of her feet. Discomfort usages be a nuisance but not interfere with quality of life. She denies any other motor or sensory disturbances  States that there is a ankle sensation of her tongue whenever she drinks cold or hot or spicy beverages. This is been going on for a few weeks now and is mild in severity. She denies any changes to the appearance of her mouth   Follow-up asthma: She is requesting a refill on albuterol. She tells me that she gets wheezing whenever going from a cold to hot environment or vice versa. Symptoms are really improved with albuterol however she's run out of an old prescription. She denies any cough.  Complains of rapid heartbeat has been present for many many years, she's been prescribed atenolol the past and in the past it helped but she's noticed her pulse usually is around 100 bpm when resting. She denies any chest pain or irregular heartbeat other than thinking that faster than average.  Review Of Systems Outlined In HPI  Past Medical History  Diagnosis Date  . Hypertension   . Diabetes mellitus without complication   . Asthma     Past Surgical History  Procedure Laterality Date  . Dnc     Family History  Problem Relation Age of Onset  . Heart attack  Mother   . Cancer Mother   . Diabetes Father   . Heart attack Father   . Diabetes Paternal Uncle   . Cancer Paternal Grandmother   . Diabetes Paternal Grandmother   . Diabetes Paternal Grandfather   . Diabetes Paternal Uncle   . Heart attack Maternal Aunt   . Cancer Maternal Uncle   . Cancer Maternal Grandmother     History   Social History  . Marital Status: Married    Spouse Name: N/A  . Number of Children: N/A  . Years of Education: N/A   Occupational History  . Not on file.   Social History Main Topics  . Smoking status: Current Every Day Smoker  . Smokeless tobacco: Not on file  . Alcohol Use: No  . Drug Use: No  . Sexual Activity: Yes   Other Topics Concern  . Not on file   Social History Narrative     Objective: BP 127/81 mmHg  Pulse 113  Wt 231 lb (104.781 kg)  General: Alert and Oriented, No Acute Distress HEENT: Pupils equal, round, reactive to light. Conjunctivae clear.  External ears unremarkable, canals clear with intact TMs with appropriate landmarks.  Middle ear appears open without effusion. Pink inferior turbinates.  Moist mucous membranes, pharynx without inflammation nor lesions.  Neck supple without palpable lymphadenopathy nor abnormal masses. Lungs: Clear to auscultation bilaterally, no wheezing/ronchi/rales.  Comfortable work of breathing. Good air movement. Cardiac: Regular rate and rhythm.  Normal S1/S2.  No murmurs, rubs, nor gallops.   Abdomen: Mild obesity Extremities: No peripheral edema.  Strong peripheral pulses.  Mental Status: No depression, anxiety, nor agitation. Skin: Warm and dry.  Assessment & Plan: Kathleen Le was seen today for diabetes.  Diagnoses and all orders for this visit:  Type II diabetes mellitus, uncontrolled Orders: -     pregabalin (LYRICA) 75 MG capsule; Take 1 capsule (75 mg total) by mouth 2 (two) times daily. -     pioglitazone (ACTOS) 30 MG tablet; Take 1 tablet (30 mg total) by mouth daily. -     POCT HgB  A1C  Essential hypertension, benign Orders: -     atenolol (TENORMIN) 50 MG tablet; Take 1 tablet by mouth once daily  Pain in mouth Orders: -     nystatin (MYCOSTATIN) 100000 UNIT/ML suspension; Take 5 mLs (500,000 Units total) by mouth 4 (four) times daily. Swish and swallow for one week.  Candidiasis of breast Orders: -     nystatin (MYCOSTATIN/NYSTOP) 100000 UNIT/GM POWD; Apply twice a day for up to one week after resolution of rash.  Asthma, chronic, unspecified asthma severity, uncomplicated Orders: -     albuterol (PROVENTIL HFA;VENTOLIN HFA) 108 (90 BASE) MCG/ACT inhaler; Inhale 2 puffs into the lungs every 6 (six) hours as needed for wheezing or shortness of breath.  Tachycardia  Other orders -     hydrochlorothiazide (HYDRODIURIL) 25 MG tablet; Take 1 tablet (25 mg total) by mouth daily.   type 2 diabetes: A1c 9.7, uncontrolled, continue all current anti-hyperglycemics and begin Actos, she's not interested in starting insulin Diabetic peripheral neuropathy: Start Lyrica, samples were given and if beneficial call for a formal prescription Essential hypertension: Controlled but increase in atenolol for uncontrolled tachycardia. Continue HCTZ Mouth pain: Trial of nystatin, also begin otc biotene Breast candidiasis: Start as needed nystatin, discussed that this will improve with increased blood sugar control Asthma: Controlled with albuterol  40 minutes spent face-to-face during visit today of which at least 50% was counseling or coordinating care regarding: 1. Type II diabetes mellitus, uncontrolled   2. Essential hypertension, benign   3. Pain in mouth   4. Candidiasis of breast   5. Asthma, chronic, unspecified asthma severity, uncomplicated   6. Tachycardia      Return in about 3 months (around 09/07/2015) for Sugar Check.

## 2015-06-11 ENCOUNTER — Telehealth: Payer: Self-pay | Admitting: Family Medicine

## 2015-06-11 MED ORDER — AMBULATORY NON FORMULARY MEDICATION
Status: DC
Start: 2015-06-11 — End: 2016-01-04

## 2015-06-11 NOTE — Telephone Encounter (Signed)
Patient needs rx sent over for lancets and test strips sent to [facility] on [address].  She uses [employer] meter.

## 2015-06-11 NOTE — Telephone Encounter (Signed)
Kathleen Le, Rx placed in in-box ready for pickup/faxing.  

## 2015-06-11 NOTE — Telephone Encounter (Signed)
faxed

## 2015-06-12 NOTE — Telephone Encounter (Signed)
Left message on vm

## 2015-06-12 NOTE — Telephone Encounter (Signed)
Seth Bake, Will you please let patient know that I looked over the formulary she dropped off and there are no alternatives listed that could take the place for Lyrica or Nystatin.  She may want to look into a different insurance plan that has better coverage in the future.  (formulary in your in box for pickup or leaving for [facility] her PCP)

## 2015-06-18 ENCOUNTER — Other Ambulatory Visit: Payer: Self-pay | Admitting: Physician Assistant

## 2015-06-27 ENCOUNTER — Encounter: Payer: Self-pay | Admitting: Family Medicine

## 2015-06-27 ENCOUNTER — Ambulatory Visit (INDEPENDENT_AMBULATORY_CARE_PROVIDER_SITE_OTHER): Payer: Commercial Managed Care - PPO | Admitting: Family Medicine

## 2015-06-27 ENCOUNTER — Telehealth: Payer: Self-pay

## 2015-06-27 VITALS — BP 127/77 | HR 92 | Wt 238.0 lb

## 2015-06-27 DIAGNOSIS — J329 Chronic sinusitis, unspecified: Secondary | ICD-10-CM

## 2015-06-27 DIAGNOSIS — A499 Bacterial infection, unspecified: Secondary | ICD-10-CM

## 2015-06-27 DIAGNOSIS — B9689 Other specified bacterial agents as the cause of diseases classified elsewhere: Secondary | ICD-10-CM

## 2015-06-27 MED ORDER — CEFDINIR 300 MG PO CAPS: 300.0000 mg | ORAL_CAPSULE | Freq: Two times a day (BID) | ORAL | Status: AC

## 2015-06-27 MED ORDER — GUAIFENESIN-CODEINE 100-10 MG/5ML PO SOLN: 10.0000 mL | ORAL | Status: DC | PRN

## 2015-06-27 NOTE — Telephone Encounter (Signed)
Patient called and left a message on nurse line asking for a return call.   Returned Call: Left message asking patient to call back.  

## 2015-06-27 NOTE — Progress Notes (Signed)
CC: Kathleen Le is a 54 y.o. female is here for Cough   Subjective: HPI: Complains of 2 months of nasal congestion, postnasal drip, nonproductive cough and pressure in the forehead that has been waxing and waning on a weekly basis. It's been persistent for the past 2 weeks. Symptoms have not improved from over-the-counter cough medication. Symptoms worse when lying down at night. Albuterol has been used but no real benefit. She denies wheezing, shortness of breath, fevers, chills, nor confusion. Symptoms are currently moderate in severity.   Review Of Systems Outlined In HPI  Past Medical History  Diagnosis Date  . Hypertension   . Diabetes mellitus without complication   . Asthma     Past Surgical History  Procedure Laterality Date  . Dnc     Family History  Problem Relation Age of Onset  . Heart attack Mother   . Cancer Mother   . Diabetes Father   . Heart attack Father   . Diabetes Paternal Uncle   . Cancer Paternal Grandmother   . Diabetes Paternal Grandmother   . Diabetes Paternal Grandfather   . Diabetes Paternal Uncle   . Heart attack Maternal Aunt   . Cancer Maternal Uncle   . Cancer Maternal Grandmother     History   Social History  . Marital Status: Married    Spouse Name: N/A  . Number of Children: N/A  . Years of Education: N/A   Occupational History  . Not on file.   Social History Main Topics  . Smoking status: Current Every Day Smoker  . Smokeless tobacco: Not on file  . Alcohol Use: No  . Drug Use: No  . Sexual Activity: Yes   Other Topics Concern  . Not on file   Social History Narrative     Objective: BP 127/77 mmHg  Pulse 92  Wt 238 lb (107.956 kg)  SpO2 96%  General: Alert and Oriented, No Acute Distress HEENT: Pupils equal, round, reactive to light. Conjunctivae clear.  External ears unremarkable, canals clear with intact TMs with appropriate landmarks.  Middle ear appears open without effusion. Pink inferior turbinates with  moderate mucoid discharge.  Moist mucous membranes, pharynx without inflammation nor lesions.  Neck supple without palpable lymphadenopathy nor abnormal masses. Lungs: Clear to auscultation bilaterally, no wheezing/ronchi/rales.  Comfortable work of breathing. Good air movement. Extremities: No peripheral edema.  Strong peripheral pulses.  Mental Status: No depression, anxiety, nor agitation. Skin: Warm and dry.  Assessment & Plan: Kathleen Le was seen today for cough.  Diagnoses and all orders for this visit:  Bacterial sinusitis Orders: -     cefdinir (OMNICEF) 300 MG capsule; Take 1 capsule (300 mg total) by mouth 2 (two) times daily. -     guaiFENesin-codeine 100-10 MG/5ML syrup; Take 10 mLs by mouth every 4 (four) hours as needed for cough.   Bacterial sinusitis: Start Omnicef,she has requested something to help with cough suppression at night since she is not able to sleep due to the cough. She's tolerated guaifenesin with codeine in the past.  Return if symptoms worsen or fail to improve.

## 2015-06-29 ENCOUNTER — Other Ambulatory Visit: Payer: Self-pay

## 2015-06-29 ENCOUNTER — Other Ambulatory Visit: Payer: Self-pay | Admitting: *Deleted

## 2015-06-29 DIAGNOSIS — IMO0002 Reserved for concepts with insufficient information to code with codable children: Secondary | ICD-10-CM

## 2015-06-29 DIAGNOSIS — I10 Essential (primary) hypertension: Secondary | ICD-10-CM

## 2015-06-29 DIAGNOSIS — E1165 Type 2 diabetes mellitus with hyperglycemia: Secondary | ICD-10-CM

## 2015-06-29 MED ORDER — AMLODIPINE BESYLATE 5 MG PO TABS: 5.0000 mg | ORAL_TABLET | Freq: Every day | ORAL | Status: DC

## 2015-06-29 MED ORDER — ATENOLOL 50 MG PO TABS: ORAL_TABLET | ORAL | Status: DC

## 2015-06-29 MED ORDER — SIMVASTATIN 20 MG PO TABS
20.0000 mg | ORAL_TABLET | Freq: Every day | ORAL | Status: DC
Start: 2015-06-29 — End: 2016-01-04

## 2015-06-29 MED ORDER — HYDROCHLOROTHIAZIDE 25 MG PO TABS: 25.0000 mg | ORAL_TABLET | Freq: Every day | ORAL | Status: DC

## 2015-06-29 MED ORDER — PIOGLITAZONE HCL 30 MG PO TABS: 30.0000 mg | ORAL_TABLET | Freq: Every day | ORAL | Status: DC

## 2015-06-29 MED ORDER — AMITRIPTYLINE HCL 100 MG PO TABS: 100.0000 mg | ORAL_TABLET | Freq: Every day | ORAL | Status: DC

## 2015-06-29 MED ORDER — LISINOPRIL 5 MG PO TABS: 5.0000 mg | ORAL_TABLET | Freq: Every day | ORAL | Status: DC

## 2015-06-29 MED ORDER — GLIPIZIDE ER 10 MG PO TB24: 10.0000 mg | ORAL_TABLET | Freq: Every day | ORAL | Status: DC

## 2015-06-29 MED ORDER — METFORMIN HCL 1000 MG PO TABS: 1000.0000 mg | ORAL_TABLET | Freq: Two times a day (BID) | ORAL | Status: DC

## 2015-07-30 ENCOUNTER — Telehealth: Payer: Self-pay | Admitting: Family Medicine

## 2015-07-30 DIAGNOSIS — IMO0002 Reserved for concepts with insufficient information to code with codable children: Secondary | ICD-10-CM

## 2015-07-30 DIAGNOSIS — E1165 Type 2 diabetes mellitus with hyperglycemia: Secondary | ICD-10-CM

## 2015-07-30 MED ORDER — PREGABALIN 50 MG PO CAPS: ORAL_CAPSULE | ORAL | Status: DC

## 2015-07-30 MED ORDER — DULOXETINE HCL 30 MG PO CPEP: 30.0000 mg | ORAL_CAPSULE | Freq: Every day | ORAL | Status: DC

## 2015-07-30 NOTE — Telephone Encounter (Signed)
Pt notified; form faxed for cymbalta; we only have 50 mg lyrica samples. Per [place] its ok for pt to take two 50 mg tablets twice daily

## 2015-07-30 NOTE — Telephone Encounter (Signed)
Kathleen Le, Patient states her hand tingling has not improved. Can you please advise her to start taking two 75mg  capsules of lyrica BID, she have samples if needed.  Also schedule f/u visit with [name] at her soonest availability.

## 2015-08-27 ENCOUNTER — Other Ambulatory Visit: Payer: Self-pay | Admitting: *Deleted

## 2015-08-27 MED ORDER — DULOXETINE HCL 30 MG PO CPEP: 30.0000 mg | ORAL_CAPSULE | Freq: Every day | ORAL | Status: DC

## 2015-10-16 ENCOUNTER — Other Ambulatory Visit: Payer: Self-pay | Admitting: Physician Assistant

## 2015-10-16 ENCOUNTER — Encounter: Payer: Self-pay | Admitting: Physician Assistant

## 2015-10-16 MED ORDER — DULOXETINE HCL 30 MG PO CPEP: 30.0000 mg | ORAL_CAPSULE | Freq: Every day | ORAL | Status: DC

## 2015-10-24 ENCOUNTER — Other Ambulatory Visit: Payer: Self-pay | Admitting: *Deleted

## 2015-10-24 MED ORDER — DULOXETINE HCL 30 MG PO CPEP: 30.0000 mg | ORAL_CAPSULE | Freq: Every day | ORAL | Status: DC

## 2015-11-07 ENCOUNTER — Other Ambulatory Visit: Payer: Self-pay | Admitting: Physician Assistant

## 2015-12-12 ENCOUNTER — Other Ambulatory Visit: Payer: Self-pay | Admitting: Physician Assistant

## 2015-12-24 ENCOUNTER — Other Ambulatory Visit: Payer: Self-pay | Admitting: Physician Assistant

## 2016-01-04 ENCOUNTER — Ambulatory Visit (INDEPENDENT_AMBULATORY_CARE_PROVIDER_SITE_OTHER): Payer: Managed Care, Other (non HMO) | Admitting: Physician Assistant

## 2016-01-04 ENCOUNTER — Encounter: Payer: Self-pay | Admitting: Physician Assistant

## 2016-01-04 VITALS — BP 131/73 | HR 92 | Wt 255.0 lb

## 2016-01-04 DIAGNOSIS — E118 Type 2 diabetes mellitus with unspecified complications: Secondary | ICD-10-CM

## 2016-01-04 DIAGNOSIS — E1165 Type 2 diabetes mellitus with hyperglycemia: Secondary | ICD-10-CM

## 2016-01-04 DIAGNOSIS — E1141 Type 2 diabetes mellitus with diabetic mononeuropathy: Secondary | ICD-10-CM

## 2016-01-04 DIAGNOSIS — I10 Essential (primary) hypertension: Secondary | ICD-10-CM

## 2016-01-04 DIAGNOSIS — IMO0002 Reserved for concepts with insufficient information to code with codable children: Secondary | ICD-10-CM

## 2016-01-04 LAB — POCT UA - MICROALBUMIN
Albumin/Creatinine Ratio, Urine, POC: <30
Creatinine, POC: 100 mg/dL
MICROALBUMIN (UR) POC: 10 mg/L

## 2016-01-04 LAB — POCT GLYCOSYLATED HEMOGLOBIN (HGB A1C): HEMOGLOBIN A1C: 8.9

## 2016-01-04 MED ORDER — GLIPIZIDE ER 10 MG PO TB24: 10.0000 mg | ORAL_TABLET | Freq: Every day | ORAL | Status: DC

## 2016-01-04 MED ORDER — LISINOPRIL 5 MG PO TABS: 5.0000 mg | ORAL_TABLET | Freq: Every day | ORAL | Status: DC

## 2016-01-04 MED ORDER — AMLODIPINE BESYLATE 5 MG PO TABS: 5.0000 mg | ORAL_TABLET | Freq: Every day | ORAL | Status: DC

## 2016-01-04 MED ORDER — ATENOLOL 50 MG PO TABS: ORAL_TABLET | ORAL | Status: DC

## 2016-01-04 MED ORDER — METFORMIN HCL 1000 MG PO TABS: 1000.0000 mg | ORAL_TABLET | Freq: Two times a day (BID) | ORAL | Status: DC

## 2016-01-04 MED ORDER — INSULIN DEGLUDEC 100 UNIT/ML ~~LOC~~ SOPN: 10.0000 [IU] | PEN_INJECTOR | Freq: Every day | SUBCUTANEOUS | Status: DC

## 2016-01-04 MED ORDER — SITAGLIPTIN PHOSPHATE 100 MG PO TABS: 100.0000 mg | ORAL_TABLET | Freq: Every day | ORAL | Status: DC

## 2016-01-04 MED ORDER — TANZEUM 30 MG ~~LOC~~ PEN: 30.0000 [IU] | PEN_INJECTOR | SUBCUTANEOUS | Status: DC

## 2016-01-04 MED ORDER — ESOMEPRAZOLE MAGNESIUM 40 MG PO CPDR: 40.0000 mg | DELAYED_RELEASE_CAPSULE | ORAL | Status: DC

## 2016-01-04 MED ORDER — PIOGLITAZONE HCL 30 MG PO TABS: 30.0000 mg | ORAL_TABLET | Freq: Every day | ORAL | Status: DC

## 2016-01-04 MED ORDER — AMBULATORY NON FORMULARY MEDICATION: Status: AC

## 2016-01-04 MED ORDER — AMITRIPTYLINE HCL 100 MG PO TABS: ORAL_TABLET | ORAL | Status: DC

## 2016-01-04 MED ORDER — SIMVASTATIN 20 MG PO TABS: 20.0000 mg | ORAL_TABLET | Freq: Every day | ORAL | Status: DC

## 2016-01-04 MED ORDER — HYDROCHLOROTHIAZIDE 25 MG PO TABS: 25.0000 mg | ORAL_TABLET | Freq: Every day | ORAL | Status: DC

## 2016-01-07 NOTE — Progress Notes (Signed)
   Subjective:    Patient ID: Kathleen Le, female    DOB: 02-28-1961, 55 y.o.   MRN: [id number]  HPI Patient is a 55 year old female who presents to the clinic for 3 month diabetic follow-up. Patient is currently taking metformin, Actos, Januvia, Tanzeum. She reports that most of her sugars are over 200. She feels tired most of time and continues to gain weight. She does have burning in both feet all the time. Now she did not tolerate neurontin or gabapentin. No vision changes at this time. No hypoglycemic events. No open or unhealed sores or wounds.   HTN- doing well no CP, palpitations, headaches, vision changes or dizziness. Taking medications as directed.    Review of Systems  All other systems reviewed and are negative.      Objective:   Physical Exam  Constitutional: She is oriented to person, place, and time. She appears well-developed and well-nourished.  HENT:  Head: Normocephalic and atraumatic.  Right Ear: External ear normal.  Left Ear: External ear normal.  Cardiovascular: Normal rate, regular rhythm and normal heart sounds.   Pulmonary/Chest: Effort normal and breath sounds normal. She has no wheezes.  Neurological: She is alert and oriented to person, place, and time.  Psychiatric: She has a normal mood and affect. Her behavior is normal.          Assessment & Plan:  Uncontrolled DM type II- .[name] Lab Results  Component Value Date   HGBA1C 8.9 01/04/2016   8.9 today has come down today but still not controlled.  Added tresiba to treatment plan.  First shot given in office. Discussed how to use and to increase by 2 units every 3 days until at fasting sugars below 120.  Continue oral medications.  Discussed diabetic diet.  Reminded of eye exam.  Foot exam done today.  On ACE.  HTN- controlled, looks good. Medications refilled.   Obesity- discussed exercise and diabetic diet. Discussed medication however will hold off at this time.

## 2016-01-08 ENCOUNTER — Other Ambulatory Visit: Payer: Self-pay | Admitting: *Deleted

## 2016-01-08 MED ORDER — AMBULATORY NON FORMULARY MEDICATION: Status: DC

## 2016-01-21 ENCOUNTER — Telehealth: Payer: Self-pay | Admitting: Physician Assistant

## 2016-01-21 NOTE — Telephone Encounter (Signed)
Received fax for prior authorization on Esomeprazole sent through cover my meds waiting on authorization. - CF

## 2016-01-22 ENCOUNTER — Other Ambulatory Visit: Payer: Self-pay | Admitting: *Deleted

## 2016-01-22 MED ORDER — PANTOPRAZOLE SODIUM 40 MG PO TBEC: 40.0000 mg | DELAYED_RELEASE_TABLET | Freq: Every day | ORAL | Status: DC

## 2016-01-23 ENCOUNTER — Other Ambulatory Visit: Payer: Self-pay | Admitting: *Deleted

## 2016-01-23 MED ORDER — OMEPRAZOLE 40 MG PO CPDR: 40.0000 mg | DELAYED_RELEASE_CAPSULE | Freq: Every day | ORAL | Status: DC

## 2016-01-25 ENCOUNTER — Other Ambulatory Visit: Payer: Self-pay | Admitting: *Deleted

## 2016-01-25 MED ORDER — DEXLANSOPRAZOLE 60 MG PO CPDR: 60.0000 mg | DELAYED_RELEASE_CAPSULE | Freq: Every day | ORAL | Status: DC

## 2016-01-30 ENCOUNTER — Telehealth: Payer: Self-pay | Admitting: *Deleted

## 2016-01-30 NOTE — Telephone Encounter (Signed)
I really don't want you to stop it. Give good rx card and states you can get at [facility] for 22.68 with card.

## 2016-01-30 NOTE — Telephone Encounter (Signed)
Pt left vm wanting to know what the possibility is of her coming off of the Cymbalta, she only has 2 days left. Rx helper is now requiring 4 pay stubs from her husband & they are giving her a hard time about getting her the medicine.  If she can't stop it, then are there other generic options?  Please advise.

## 2016-01-31 NOTE — Telephone Encounter (Signed)
Left message for patient that Kathleen Le doesn't want her to stop the Cymbalta.  Also gave the information about the Good Rx card.

## 2016-02-02 LAB — HM DIABETES EYE EXAM

## 2016-02-06 ENCOUNTER — Encounter: Payer: Self-pay | Admitting: Physician Assistant

## 2016-03-19 ENCOUNTER — Telehealth: Payer: Self-pay | Admitting: *Deleted

## 2016-03-19 NOTE — Telephone Encounter (Signed)
Esomeprazole was denied - CF

## 2016-03-19 NOTE — Telephone Encounter (Signed)
Denied because this is otc. Ref # [id number]

## 2016-04-04 ENCOUNTER — Ambulatory Visit: Payer: Managed Care, Other (non HMO) | Admitting: Physician Assistant

## 2016-05-08 ENCOUNTER — Other Ambulatory Visit: Payer: Self-pay | Admitting: *Deleted

## 2016-05-08 DIAGNOSIS — J45909 Unspecified asthma, uncomplicated: Secondary | ICD-10-CM

## 2016-05-22 ENCOUNTER — Other Ambulatory Visit: Payer: Self-pay | Admitting: *Deleted

## 2016-05-22 MED ORDER — TANZEUM 30 MG ~~LOC~~ PEN: 30.0000 [IU] | PEN_INJECTOR | SUBCUTANEOUS | Status: DC

## 2016-05-28 ENCOUNTER — Encounter: Payer: Self-pay | Admitting: *Deleted

## 2016-06-18 ENCOUNTER — Telehealth: Payer: Self-pay

## 2016-06-18 NOTE — Telephone Encounter (Signed)
Hagen called and states her blood sugar went up to 220 today. She ate a biscuit, bo-berry biscuit, fries and sweet tea. I advised her that the whole meal was nothing but sugar and carbs. I advised the to limit to one carb per meal. Told her to continue taking medication as prescribed and call us if fasting morning blood sugar is more than 200.

## 2016-06-18 NOTE — Telephone Encounter (Signed)
Yes. Also remind patient to continue increasing tresbia until fasting 120.

## 2016-06-18 NOTE — Telephone Encounter (Signed)
Pt notified of recommendations

## 2016-06-25 ENCOUNTER — Ambulatory Visit (INDEPENDENT_AMBULATORY_CARE_PROVIDER_SITE_OTHER): Payer: Managed Care, Other (non HMO) | Admitting: Physician Assistant

## 2016-06-25 ENCOUNTER — Encounter: Payer: Self-pay | Admitting: Physician Assistant

## 2016-06-25 VITALS — BP 135/73 | HR 89 | Ht 65.0 in | Wt 262.0 lb

## 2016-06-25 DIAGNOSIS — L304 Erythema intertrigo: Secondary | ICD-10-CM | POA: Diagnosis not present

## 2016-06-25 DIAGNOSIS — E781 Pure hyperglyceridemia: Secondary | ICD-10-CM

## 2016-06-25 DIAGNOSIS — E669 Obesity, unspecified: Secondary | ICD-10-CM

## 2016-06-25 DIAGNOSIS — Z1231 Encounter for screening mammogram for malignant neoplasm of breast: Secondary | ICD-10-CM

## 2016-06-25 DIAGNOSIS — E1140 Type 2 diabetes mellitus with diabetic neuropathy, unspecified: Secondary | ICD-10-CM

## 2016-06-25 DIAGNOSIS — Z794 Long term (current) use of insulin: Secondary | ICD-10-CM

## 2016-06-25 LAB — COMPLETE METABOLIC PANEL WITH GFR
ALBUMIN: 4 g/dL (ref 3.6–5.1)
ALK PHOS: 78 U/L (ref 33–130)
ALT: 14 U/L (ref 6–29)
AST: 11 U/L (ref 10–35)
BILIRUBIN TOTAL: 0.3 mg/dL (ref 0.2–1.2)
BUN: 24 mg/dL (ref 7–25)
CO2: 30 mmol/L (ref 20–31)
Calcium: 9.7 mg/dL (ref 8.6–10.4)
Chloride: 99 mmol/L (ref 98–110)
Creat: 0.91 mg/dL (ref 0.50–1.05)
GFR, Est African American: 83 mL/min (ref >=60)
GFR, Est Non African American: 72 mL/min (ref >=60)
GLUCOSE: 122 mg/dL — AB (ref 65–99)
Potassium: 4.7 mmol/L (ref 3.5–5.3)
SODIUM: 136 mmol/L (ref 135–146)
TOTAL PROTEIN: 6.7 g/dL (ref 6.1–8.1)

## 2016-06-25 LAB — LIPID PANEL
Cholesterol: 182 mg/dL (ref 125–200)
HDL: 66 mg/dL (ref >=46)
LDL Cholesterol: 87 mg/dL (ref <130)
Total CHOL/HDL Ratio: 2.8 Ratio (ref <=5.0)
Triglycerides: 147 mg/dL (ref <150)
VLDL: 29 mg/dL (ref <30)

## 2016-06-25 LAB — POCT GLYCOSYLATED HEMOGLOBIN (HGB A1C): Hemoglobin A1C: 6.7

## 2016-06-25 MED ORDER — NYSTATIN 100000 UNIT/GM EX CREA: 1.0000 "application " | TOPICAL_CREAM | Freq: Two times a day (BID) | CUTANEOUS | Status: DC

## 2016-06-25 MED ORDER — PHENTERMINE HCL 15 MG PO CAPS: 15.0000 mg | ORAL_CAPSULE | ORAL | Status: DC

## 2016-06-25 NOTE — Progress Notes (Signed)
   Subjective:    Patient ID: Kathleen Le, female    DOB: [birth year], 55 y.o.   MRN: [id number]  HPI  Pt is a 55 yo female who presents to the clinic for follow up.  DM- she is late on a1c. Checking sugars morning in 90's and afternoons in 140's. She denies any hypoglycemic events. No wounds that are not healing. She did have one episode of sugar spike after a meal very high in sugars. She has not done that since. She has been complaint with medications. Eye exam done.   She would like to work on weight loss. She is trying to walk. She knows her diet has to change.   She has a rash that comes and goes under her breast. Itchy at times. Gold bond helps.        Review of Systems  All other systems reviewed and are negative.      Objective:   Physical Exam  Constitutional: She is oriented to person, place, and time. She appears well-developed and well-nourished.  Obesity   HENT:  Head: Normocephalic and atraumatic.  Cardiovascular: Normal rate, regular rhythm and normal heart sounds.   Pulmonary/Chest: Effort normal and breath sounds normal.  Neurological: She is alert and oriented to person, place, and time.  Psychiatric: She has a normal mood and affect. Her behavior is normal.          Assessment & Plan:  DM- A1C 6.7 today much improved.  Continue on same medications.  On ACE.  On STATIN.  Up to date eye exam.  Continue to work on weight loss.  cmp ordered.   Obesity- discussed options. Will try phentermine 15mg . Discussed SE's. Follow up in 1 month.   Intertrigo- nystatin cream given to use as needed. Discussed prevention.   Hypertriglyceridemia/hyperlipidemia- discussed fish oil and low sugar/carb diet. Lipid and cmp ordered.

## 2016-06-26 ENCOUNTER — Other Ambulatory Visit: Payer: Self-pay | Admitting: *Deleted

## 2016-06-26 MED ORDER — OMEPRAZOLE 40 MG PO CPDR: 40.0000 mg | DELAYED_RELEASE_CAPSULE | Freq: Every day | ORAL | Status: DC

## 2016-07-04 ENCOUNTER — Telehealth: Payer: Self-pay | Admitting: *Deleted

## 2016-07-04 ENCOUNTER — Other Ambulatory Visit: Payer: Self-pay | Admitting: *Deleted

## 2016-07-04 MED ORDER — FUROSEMIDE 20 MG PO TABS: 20.0000 mg | ORAL_TABLET | ORAL | Status: DC | PRN

## 2016-07-04 NOTE — Telephone Encounter (Signed)
Do not double. Continue talking HCTZ 25mg . Please send lasix 20mg  as needed for lower leg edema #30 1 refill. Encourage elevating feet, compression stockings.

## 2016-07-04 NOTE — Telephone Encounter (Signed)
Pt levt vm complaining of continued swelling in her legs & feet.  Wanted to know if she could double her diuretic? Please advise.

## 2016-07-04 NOTE — Telephone Encounter (Signed)
Pt.notified

## 2016-07-14 ENCOUNTER — Other Ambulatory Visit: Payer: Self-pay | Admitting: Physician Assistant

## 2016-07-23 ENCOUNTER — Ambulatory Visit: Payer: Managed Care, Other (non HMO)

## 2016-07-27 ENCOUNTER — Other Ambulatory Visit: Payer: Self-pay | Admitting: Physician Assistant

## 2016-07-27 DIAGNOSIS — E1141 Type 2 diabetes mellitus with diabetic mononeuropathy: Secondary | ICD-10-CM

## 2016-07-27 DIAGNOSIS — IMO0002 Reserved for concepts with insufficient information to code with codable children: Secondary | ICD-10-CM

## 2016-07-27 DIAGNOSIS — E1165 Type 2 diabetes mellitus with hyperglycemia: Principal | ICD-10-CM

## 2016-07-28 ENCOUNTER — Other Ambulatory Visit: Payer: Self-pay | Admitting: *Deleted

## 2016-07-28 DIAGNOSIS — IMO0002 Reserved for concepts with insufficient information to code with codable children: Secondary | ICD-10-CM

## 2016-07-28 DIAGNOSIS — E1165 Type 2 diabetes mellitus with hyperglycemia: Secondary | ICD-10-CM

## 2016-07-28 DIAGNOSIS — I10 Essential (primary) hypertension: Secondary | ICD-10-CM

## 2016-07-28 DIAGNOSIS — E1141 Type 2 diabetes mellitus with diabetic mononeuropathy: Secondary | ICD-10-CM

## 2016-07-28 MED ORDER — TANZEUM 30 MG ~~LOC~~ PEN: 30.0000 [IU] | PEN_INJECTOR | SUBCUTANEOUS | 3 refills | Status: DC

## 2016-07-28 MED ORDER — LISINOPRIL 5 MG PO TABS: 5.0000 mg | ORAL_TABLET | Freq: Every day | ORAL | 1 refills | Status: DC

## 2016-07-28 MED ORDER — ATENOLOL 50 MG PO TABS: ORAL_TABLET | ORAL | 1 refills | Status: DC

## 2016-07-28 MED ORDER — HYDROCHLOROTHIAZIDE 25 MG PO TABS: 25.0000 mg | ORAL_TABLET | Freq: Every day | ORAL | 1 refills | Status: DC

## 2016-07-28 MED ORDER — FUROSEMIDE 20 MG PO TABS: 20.0000 mg | ORAL_TABLET | ORAL | 1 refills | Status: DC | PRN

## 2016-07-28 MED ORDER — METFORMIN HCL 1000 MG PO TABS: 1000.0000 mg | ORAL_TABLET | Freq: Two times a day (BID) | ORAL | 1 refills | Status: DC

## 2016-07-28 MED ORDER — AMLODIPINE BESYLATE 5 MG PO TABS: 5.0000 mg | ORAL_TABLET | Freq: Every day | ORAL | 1 refills | Status: DC

## 2016-07-28 MED ORDER — SIMVASTATIN 20 MG PO TABS: 20.0000 mg | ORAL_TABLET | Freq: Every day | ORAL | 1 refills | Status: DC

## 2016-07-28 MED ORDER — PIOGLITAZONE HCL 30 MG PO TABS
30.0000 mg | ORAL_TABLET | Freq: Every day | ORAL | 1 refills | Status: DC
Start: 2016-07-28 — End: 2016-10-21

## 2016-07-28 MED ORDER — SITAGLIPTIN PHOSPHATE 100 MG PO TABS: 100.0000 mg | ORAL_TABLET | Freq: Every day | ORAL | 1 refills | Status: DC

## 2016-07-28 MED ORDER — OMEPRAZOLE 40 MG PO CPDR: 40.0000 mg | DELAYED_RELEASE_CAPSULE | Freq: Every day | ORAL | 1 refills | Status: DC

## 2016-07-28 MED ORDER — GLIPIZIDE ER 10 MG PO TB24: 10.0000 mg | ORAL_TABLET | Freq: Every day | ORAL | 1 refills | Status: DC

## 2016-09-30 ENCOUNTER — Ambulatory Visit: Payer: Managed Care, Other (non HMO) | Admitting: Physician Assistant

## 2016-10-21 ENCOUNTER — Ambulatory Visit (INDEPENDENT_AMBULATORY_CARE_PROVIDER_SITE_OTHER): Payer: Self-pay | Admitting: Osteopathic Medicine

## 2016-10-21 ENCOUNTER — Encounter: Payer: Self-pay | Admitting: Osteopathic Medicine

## 2016-10-21 VITALS — BP 131/71 | HR 98 | Temp 97.9°F | Ht 65.0 in | Wt 262.0 lb

## 2016-10-21 DIAGNOSIS — J441 Chronic obstructive pulmonary disease with (acute) exacerbation: Secondary | ICD-10-CM

## 2016-10-21 DIAGNOSIS — J069 Acute upper respiratory infection, unspecified: Secondary | ICD-10-CM

## 2016-10-21 DIAGNOSIS — J439 Emphysema, unspecified: Secondary | ICD-10-CM

## 2016-10-21 DIAGNOSIS — B9789 Other viral agents as the cause of diseases classified elsewhere: Secondary | ICD-10-CM

## 2016-10-21 DIAGNOSIS — F17200 Nicotine dependence, unspecified, uncomplicated: Secondary | ICD-10-CM

## 2016-10-21 MED ORDER — PREDNISONE 20 MG PO TABS: 20.0000 mg | ORAL_TABLET | Freq: Two times a day (BID) | ORAL | 0 refills | Status: DC

## 2016-10-21 MED ORDER — GUAIFENESIN-CODEINE 100-10 MG/5ML PO SOLN: 5.0000 mL | Freq: Four times a day (QID) | ORAL | 0 refills | Status: DC | PRN

## 2016-10-21 MED ORDER — AZITHROMYCIN 250 MG PO TABS: ORAL_TABLET | ORAL | 0 refills | Status: DC

## 2016-10-21 NOTE — Progress Notes (Signed)
HPI: Kathleen Le is a 55 y.o. female  who presents to [facility] today, 10/21/16,  for chief complaint of:  Chief Complaint  Patient presents with  . Cough    Cough/Illness . Context: son and grandsons moved down here to live with her and brought URI with them. Patient states she has a history of emphysema, does not recall ever undergoing pulmonary function test. . Quality: coughing, occasional productive . Duration: 1 week  . Modifying factors: has tried old cough medicine prescription as well as mucinex. Last took albuterol about few hours ago . Assoc signs/symptoms: sore throat, Minimal sinus congestion. No fever    Past medical, surgical, social and family history reviewed: Past Medical History:  Diagnosis Date  . Asthma   . Diabetes mellitus without complication ([place])   . Hypertension    Past Surgical History:  Procedure Laterality Date  . [place]     Social History  Substance Use Topics  . Smoking status: Current Every Day Smoker  . Smokeless tobacco: Not on file  . Alcohol use No   Family History  Problem Relation Age of Onset  . Heart attack Mother   . Cancer Mother   . Diabetes Father   . Heart attack Father   . Diabetes Paternal Uncle   . Cancer Paternal Grandmother   . Diabetes Paternal Grandmother   . Diabetes Paternal Grandfather   . Diabetes Paternal Uncle   . Heart attack Maternal Aunt   . Cancer Maternal Uncle   . Cancer Maternal Grandmother      Current medication list and allergy/intolerance information reviewed:   Current Outpatient Prescriptions on File Prior to Visit  Medication Sig Dispense Refill  . albuterol (PROVENTIL HFA;VENTOLIN HFA) 108 (90 BASE) MCG/ACT inhaler Inhale 2 puffs into the lungs every 6 (six) hours as needed for wheezing or shortness of breath. 1 Inhaler 4  . AMBULATORY NON FORMULARY MEDICATION Blood sugar testing strips and lancets for TrueTrack glucometer.  Use to check blood sugar up  to two times a day.  Dx: Type 2 Diabetes 100 Units 4  . AMBULATORY NON FORMULARY MEDICATION Please provide needles for Tresiba pens 100 each 3  . amitriptyline (ELAVIL) 100 MG tablet take 1 tablet by mouth at bedtime 90 tablet 1  . amLODipine (NORVASC) 5 MG tablet Take 1 tablet (5 mg total) by mouth daily. 90 tablet 1  . aspirin 81 MG tablet Take 81 mg by mouth daily.    [name] atenolol (TENORMIN) 50 MG tablet Take 1 tablet by mouth once daily 90 tablet 1  . Flaxseed, Linseed, (FLAX SEED OIL) 1000 MG CAPS Take 1,000 mg by mouth 2 (two) times daily.    . furosemide (LASIX) 20 MG tablet Take 1 tablet (20 mg total) by mouth as needed. 90 tablet 1  . glipiZIDE (GLUCOTROL XL) 10 MG 24 hr tablet Take 1 tablet (10 mg total) by mouth daily. 90 tablet 1  . hydrochlorothiazide (HYDRODIURIL) 25 MG tablet Take 1 tablet (25 mg total) by mouth daily. 90 tablet 1  . Insulin Degludec (TRESIBA FLEXTOUCH) 100 UNIT/ML SOPN Inject 10 Units into the skin at bedtime. Increase by 2 units every 3 days until fasting glucose is 120 or under.  Please add needles if needed. 2 pen 5  . lisinopril (PRINIVIL,ZESTRIL) 5 MG tablet Take 1 tablet (5 mg total) by mouth daily. 90 tablet 1  . metFORMIN (GLUCOPHAGE) 1000 MG tablet Take 1 tablet (1,000 mg total) by mouth  2 (two) times daily with a meal. 180 tablet 1  . nystatin cream (MYCOSTATIN) Apply 1 application topically 2 (two) times daily. 80 g 0  . Omega-3 Fatty Acids (FISH OIL) 1000 MG CAPS Take 1,000 mg by mouth 2 (two) times daily.    [name] omeprazole (PRILOSEC) 40 MG capsule Take 1 capsule (40 mg total) by mouth daily. 90 capsule 1  . phentermine 15 MG capsule Take 1 capsule (15 mg total) by mouth every morning. 30 capsule 0  . Probiotic Product (PROBIOTIC DAILY PO) Take by mouth daily.    . simvastatin (ZOCOR) 20 MG tablet Take 1 tablet (20 mg total) by mouth daily. 90 tablet 1  . sitaGLIPtin (JANUVIA) 100 MG tablet Take 1 tablet (100 mg total) by mouth daily. 90 tablet 1  .  TANZEUM 30 MG PEN Inject 30 Units into the skin once a week. 12 each 3   No current facility-administered medications on file prior to visit.    Allergies  Allergen Reactions  . Gabapentin     Nauseated/feeling sick  . Invokana [Canagliflozin]     Vaginitis   . Lyrica [Pregabalin]     Pain in feet increased and spasm      Review of Systems:  Constitutional: +recent illness, no fever or chills  HEENT: No  headache, no vision change, + sinus congestion  Cardiac: No  chest pain, No  pressure, No palpitations  Respiratory:  No  shortness of breath. +Cough  Gastrointestinal: No  abdominal pain, no change on bowel habits  Musculoskeletal: No new myalgia/arthralgia  Skin: No  Rash   Exam:  BP 131/71   Pulse 98   Temp 97.9 F (36.6 C) (Oral)   Ht 5\' 5"  (1.651 m)   Wt 262 lb (118.8 kg)   BMI 43.60 kg/m   Constitutional: VS see above. General Appearance: alert, well-developed, well-nourished, NAD  Eyes: Normal lids and conjunctive, non-icteric sclera  Ears, Nose, Mouth, Throat: MMM, Normal external inspection ears/nares/mouth/lips/gums. TM obscured by cerumen bilaterally. No pharyngeal or tonsillar erythema/exudate. No sinus tenderness on percussion  Neck: No masses, trachea midline. No cervical lymphadenopathy  Respiratory: Normal respiratory effort. no wheeze, no rhonchi, no rales  Cardiovascular: S1/S2 normal, no murmur, no rub/gallop auscultated. RRR.   Musculoskeletal: Gait normal. Symmetric and independent movement of all extremities  Neurological: Normal balance/coordination. No tremor.  Skin: warm, dry, intact.   Psychiatric: Normal judgment/insight. Normal mood and affect. Oriented x3.     ASSESSMENT/PLAN:   Asthma and emphysema are on patient's problem list but she does not recall ever having PFT done. We'll treat as below for COPD exacerbation, can continue home albuterol, lungs sound clear at this point but can be due to recent SABA use, afebrile  and no significant productive cough more concerning for postviral cough syndrome versus COPD exacerbation.   Fill antibiotics if worse.   Advise consider return to clinic for PFT  Viral URI with cough - Plan: guaiFENesin-codeine 100-10 MG/5ML syrup  Pulmonary emphysema, unspecified emphysema type ([place])  COPD exacerbation ([place]) - Plan: predniSONE (DELTASONE) 20 MG tablet, azithromycin (ZITHROMAX) 250 MG tablet, guaiFENesin-codeine 100-10 MG/5ML syrup  Tobacco use disorder    Patient Instructions  I think your symptoms are best explained by postviral cough syndrome with possibility of COPD exacerbation.   Continue albuterol. Cough medicine prescription dextromethorphan w/codeine given to take in the evenings/as needed for severe cough, this can be sedating so use caution. Over-the-counter medications to take include guaifenesin plus or minus dextromethorphan  for expectorant and cough suppressant (check to make sure you are not taking dextromethorphan tablets along with the cough syrup prescription you're given as this would be a duplication). Look for lozenges with benzocaine plus menthol for numbing effect which can decrease irritation. Honey has been shown to be just as good as any medication, continues is much of this as he wanted. Herbal teas which help to coat the throat include ingredients elm bark, marshmallow root, licorice root.   You have been given a prescription for antibiotics to take if the steroids and cough medications are not helping in 2 days.   Please quit smoking! We are here to help you if you need it!   If your symptoms worsen or fail to improve, particularly if you develop significant productive cough along with fever or shortness of breath, we may need to bring you back to do a chest x-ray, but at this point lungs sound clear.  Would discuss with PCP regarding lung function test for further evaluation for COPD if this isn't something you have done in the past.   Take  care, and let us know if you have any other questions or if there is anything else we can do for you! -Dr. Loni Muse.      Visit summary with medication list and pertinent instructions was printed for patient to review. All questions at time of visit were answered - patient instructed to contact office with any additional concerns. ER/RTC precautions were reviewed with the patient. Follow-up plan: Return if symptoms worsen or fail to improve.

## 2016-10-21 NOTE — Patient Instructions (Signed)
I think your symptoms are best explained by postviral cough syndrome with possibility of COPD exacerbation.   Continue albuterol. Cough medicine prescription dextromethorphan w/codeine given to take in the evenings/as needed for severe cough, this can be sedating so use caution. Over-the-counter medications to take include guaifenesin plus or minus dextromethorphan for expectorant and cough suppressant (check to make sure you are not taking dextromethorphan tablets along with the cough syrup prescription you're given as this would be a duplication). Look for lozenges with benzocaine plus menthol for numbing effect which can decrease irritation. Honey has been shown to be just as good as any medication, continues is much of this as he wanted. Herbal teas which help to coat the throat include ingredients elm bark, marshmallow root, licorice root.   You have been given a prescription for antibiotics to take if the steroids and cough medications are not helping in 2 days.   Please quit smoking! We are here to help you if you need it!   If your symptoms worsen or fail to improve, particularly if you develop significant productive cough along with fever or shortness of breath, we may need to bring you back to do a chest x-ray, but at this point lungs sound clear.  Would discuss with PCP regarding lung function test for further evaluation for COPD if this isn't something you have done in the past.   Take care, and let us know if you have any other questions or if there is anything else we can do for you! -Dr. Loni Muse.

## 2016-11-07 ENCOUNTER — Other Ambulatory Visit: Payer: Self-pay | Admitting: *Deleted

## 2016-11-07 MED ORDER — FLUCONAZOLE 150 MG PO TABS: 150.0000 mg | ORAL_TABLET | Freq: Once | ORAL | 0 refills | Status: DC

## 2016-12-02 ENCOUNTER — Encounter: Payer: Self-pay | Admitting: Physician Assistant

## 2016-12-02 ENCOUNTER — Ambulatory Visit (INDEPENDENT_AMBULATORY_CARE_PROVIDER_SITE_OTHER): Payer: Self-pay | Admitting: Physician Assistant

## 2016-12-02 VITALS — BP 142/82 | HR 101 | Ht 65.0 in | Wt 266.0 lb

## 2016-12-02 DIAGNOSIS — J441 Chronic obstructive pulmonary disease with (acute) exacerbation: Secondary | ICD-10-CM

## 2016-12-02 DIAGNOSIS — J069 Acute upper respiratory infection, unspecified: Secondary | ICD-10-CM

## 2016-12-02 DIAGNOSIS — J439 Emphysema, unspecified: Secondary | ICD-10-CM

## 2016-12-02 DIAGNOSIS — B9789 Other viral agents as the cause of diseases classified elsewhere: Secondary | ICD-10-CM

## 2016-12-02 MED ORDER — PREDNISONE 20 MG PO TABS
ORAL_TABLET | ORAL | 0 refills | Status: DC
Start: 2016-12-02 — End: 2016-12-08

## 2016-12-02 MED ORDER — GUAIFENESIN-CODEINE 100-10 MG/5ML PO SOLN: 5.0000 mL | Freq: Four times a day (QID) | ORAL | 0 refills | Status: DC | PRN

## 2016-12-02 NOTE — Progress Notes (Addendum)
   Subjective:    Patient ID: Kathleen Le, female    DOB: 07-Jun-1961, 55 y.o.   MRN: [id number]  HPI Pt is a 55 yo female who presents to the clinic with cough, SOB, chest tightness, wheezing for past 7 days. She is a current smoker and not on any daily inhalers. She did use advair in the past but was too expensive and not using now.  She is using her rescue inhaler as needed which does help. She just finished antibiotic about 2 weeks ago for a similar infection. Pt denies any fever, chills, body aches, n/v/d. She has her grandchildren living in the house with her bringing in lots of viruses. She is not taking anything OTC.   Review of Systems  All other systems reviewed and are negative.      Objective:   Physical Exam  Constitutional: She appears well-developed and well-nourished.  HENT:  Head: Normocephalic and atraumatic.  Right Ear: External ear normal.  Left Ear: External ear normal.  Mouth/Throat: Oropharynx is clear and moist.  TM's clear bilaterally. Negative for maxillary or frontal sinus tenderness.  Bilateral nasal turbinates red and swollen.   Eyes: Conjunctivae are normal.  Neck: Normal range of motion. Neck supple.  Cardiovascular: Regular rhythm and normal heart sounds.   Tachycardia   Pulmonary/Chest:  Expiratory wheezing bilateral lungs.   Lymphadenopathy:    She has no cervical adenopathy.  Skin: Skin is dry.  Psychiatric: She has a normal mood and affect. Her behavior is normal.          Assessment & Plan:  [name] KitchenVicky was seen today for cough.  Diagnoses and all orders for this visit:  COPD exacerbation ([place]) -     predniSONE (DELTASONE) 20 MG tablet; Take 3 tablets for 3 days, take 2 tablets for 3 days, take 1 tablet for 3 days, and take 1/2 tablet for 4 days. -     guaiFENesin-codeine 100-10 MG/5ML syrup; Take 5 mLs by mouth every 6 (six) hours as needed for cough.  Pulmonary emphysema, unspecified emphysema type ([place]) -     predniSONE (DELTASONE)  20 MG tablet; Take 3 tablets for 3 days, take 2 tablets for 3 days, take 1 tablet for 3 days, and take 1/2 tablet for 4 days. -     guaiFENesin-codeine 100-10 MG/5ML syrup; Take 5 mLs by mouth every 6 (six) hours as needed for cough.  Viral URI with cough -     predniSONE (DELTASONE) 20 MG tablet; Take 3 tablets for 3 days, take 2 tablets for 3 days, take 1 tablet for 3 days, and take 1/2 tablet for 4 days. -     guaiFENesin-codeine 100-10 MG/5ML syrup; Take 5 mLs by mouth every 6 (six) hours as needed for cough.   Discussed with patient I feel this is viral and exacerbating her COPD. Start with prednisone. If symptoms continue we may need to add abx and consider daily inhaler therapy for COPD. Continue to use rescue every 2-4 hours. Tachycardia likely due to coughing and albuterol use. Follow up at beginning of year.

## 2016-12-08 ENCOUNTER — Other Ambulatory Visit: Payer: Self-pay | Admitting: *Deleted

## 2016-12-16 ENCOUNTER — Other Ambulatory Visit: Payer: Self-pay | Admitting: *Deleted

## 2016-12-16 MED ORDER — FLUCONAZOLE 150 MG PO TABS: 150.0000 mg | ORAL_TABLET | Freq: Once | ORAL | 0 refills | Status: AC

## 2017-01-09 ENCOUNTER — Telehealth: Payer: Self-pay | Admitting: *Deleted

## 2017-01-09 NOTE — Telephone Encounter (Signed)
Only samples we have are insulin. What are her sugars running fasting? Last a1c was really good. We have to be careful to avoid hypoglycemia.

## 2017-01-09 NOTE — Telephone Encounter (Signed)
Pt left vm stating that she finally got approval through the pharmaceutical company but she is completely out of her tanzeum.  Is tresiba or toujeo comparable since we have those samples here? Please advise.

## 2017-01-09 NOTE — Telephone Encounter (Signed)
Mariaceleste is currently without insurance.

## 2017-01-09 NOTE — Telephone Encounter (Signed)
Not the same as tresiba or toujeo.  Tanzeum has been taken off market so will need to change any way.  Comparable to trulicity, bydeuron, victoza. Can send over any of those options? Can we call insurance and see what they prefer [place] to tree.

## 2017-01-19 ENCOUNTER — Other Ambulatory Visit: Payer: Self-pay | Admitting: *Deleted

## 2017-01-19 MED ORDER — GLIPIZIDE ER 10 MG PO TB24: 10.0000 mg | ORAL_TABLET | Freq: Every day | ORAL | 1 refills | Status: DC

## 2017-01-19 MED ORDER — AMITRIPTYLINE HCL 100 MG PO TABS: 100.0000 mg | ORAL_TABLET | Freq: Every day | ORAL | 1 refills | Status: DC

## 2017-01-19 MED ORDER — AMLODIPINE BESYLATE 5 MG PO TABS: 5.0000 mg | ORAL_TABLET | Freq: Every day | ORAL | 1 refills | Status: DC

## 2017-01-19 MED ORDER — SIMVASTATIN 20 MG PO TABS: 20.0000 mg | ORAL_TABLET | Freq: Every day | ORAL | 1 refills | Status: DC

## 2017-01-23 NOTE — Telephone Encounter (Signed)
Spoke with Pt, at home without insulin her sugars are running from 250-350. Pt will come pick up Toujeo sample.

## 2017-02-11 ENCOUNTER — Ambulatory Visit (INDEPENDENT_AMBULATORY_CARE_PROVIDER_SITE_OTHER): Payer: Self-pay | Admitting: Physician Assistant

## 2017-02-11 ENCOUNTER — Encounter: Payer: Self-pay | Admitting: Physician Assistant

## 2017-02-11 VITALS — BP 144/79 | HR 93 | Ht 65.0 in | Wt 257.0 lb

## 2017-02-11 DIAGNOSIS — E118 Type 2 diabetes mellitus with unspecified complications: Secondary | ICD-10-CM

## 2017-02-11 DIAGNOSIS — R300 Dysuria: Secondary | ICD-10-CM

## 2017-02-11 DIAGNOSIS — IMO0002 Reserved for concepts with insufficient information to code with codable children: Secondary | ICD-10-CM

## 2017-02-11 DIAGNOSIS — E1165 Type 2 diabetes mellitus with hyperglycemia: Secondary | ICD-10-CM

## 2017-02-11 DIAGNOSIS — N898 Other specified noninflammatory disorders of vagina: Secondary | ICD-10-CM

## 2017-02-11 LAB — WET PREP FOR TRICH, YEAST, CLUE
Clue Cells Wet Prep HPF POC: NONE SEEN
TRICH WET PREP: NONE SEEN
YEAST WET PREP: NONE SEEN

## 2017-02-11 LAB — POCT URINALYSIS DIPSTICK
Bilirubin, UA: NEGATIVE
GLUCOSE UA: 500
Ketones, UA: NEGATIVE
Leukocytes, UA: NEGATIVE
NITRITE UA: NEGATIVE
PROTEIN UA: NEGATIVE
RBC UA: NEGATIVE
SPEC GRAV UA: 1.015
UROBILINOGEN UA: 0.2
pH, UA: 6

## 2017-02-11 LAB — POCT GLYCOSYLATED HEMOGLOBIN (HGB A1C): Hemoglobin A1C: 12.7

## 2017-02-11 MED ORDER — PIOGLITAZONE HCL 30 MG PO TABS: 30.0000 mg | ORAL_TABLET | Freq: Every day | ORAL | 2 refills | Status: DC

## 2017-02-11 MED ORDER — LIRAGLUTIDE 18 MG/3ML ~~LOC~~ SOPN: PEN_INJECTOR | SUBCUTANEOUS | 2 refills | Status: DC

## 2017-02-11 NOTE — Progress Notes (Signed)
   Subjective:    Patient ID: Kathleen Le, female    DOB: October 01, 1961, 56 y.o.   MRN: [id number]  HPI  Pt is a 56 yo female with DM type II who presents to the clinic with dysuria and polyuria and polydipsia. She admits to urinating 20 times a day. She has tried monistat OTC and dilfucan with little relief. She admits she has not been taking many or her diabetes medications and not monitoring sugar. She has no clue what her sugars are running.  She is supposed to be on:  Glipizide, januiva, metformin, tresbia. She has no been compliant.    Review of Systems  All other systems reviewed and are negative.      Objective:   Physical Exam  Constitutional: She is oriented to person, place, and time. She appears well-developed and well-nourished.  HENT:  Head: Normocephalic and atraumatic.  Cardiovascular: Normal rate, regular rhythm and normal heart sounds.   Pulmonary/Chest: Effort normal and breath sounds normal.  No CVA tenderness.   Abdominal: Soft. Bowel sounds are normal. She exhibits no distension and no mass. There is no tenderness. There is no rebound and no guarding.  Neurological: She is alert and oriented to person, place, and time.  Psychiatric: She has a normal mood and affect. Her behavior is normal.          Assessment & Plan:  [name] KitchenAdaora was seen today for diabetes.  Diagnoses and all orders for this visit:  Dysuria  Uncontrolled type 2 diabetes mellitus with complication, without long-term current use of insulin (HCC) -     POCT HgB A1C -     pioglitazone (ACTOS) 30 MG tablet; Take 1 tablet (30 mg total) by mouth daily. -     liraglutide 18 MG/3ML SOPN; .6mg  [place] injection daily for one week then increase to 1.8mg  [place] injection daily. Please include 73mm needles.  Vaginal irritation -     POCT urinalysis dipstick -     WET PREP FOR TRICH, YEAST, CLUE   .[name] Results for orders placed or performed in visit on 02/11/17  WET PREP FOR [place], YEAST, CLUE  Result Value  Ref Range   Yeast Wet Prep HPF POC NONE SEEN NONE SEEN   Trich, Wet Prep NONE SEEN NONE SEEN   Clue Cells Wet Prep HPF POC NONE SEEN NONE SEEN   WBC, Wet Prep HPF POC RARE NONE SEEN  POCT HgB A1C  Result Value Ref Range   Hemoglobin A1C 12.7   POCT urinalysis dipstick  Result Value Ref Range   Color, UA yellow    Clarity, UA clear    Glucose, UA 500    Bilirubin, UA neg    Ketones, UA neg    Spec Grav, UA 1.015    Blood, UA neg    pH, UA 6.0    Protein, UA neg    Urobilinogen, UA 0.2    Nitrite, UA neg    Leukocytes, UA Negative Negative   Will culture. WET prep negative.  A!C terrible. Very out of control. Pt not taking medication. Start taking meds and checking fasting glucose.  Added victoza to see if cheaper than trulicity.  Add actos.  Increase tresiba by 2 units every 3 days until fasting 100. Samples given today. Find out through insurance what they weill cover if we need to change.  Follow up in 3 months.  I think urine symptoms will begin to improve as sugar improves.

## 2017-02-11 NOTE — Progress Notes (Signed)
Call pt: no yeast, no trich, no clue cells. I think urethra is irritated causing discomfort.

## 2017-02-11 NOTE — Patient Instructions (Addendum)
Increase tresbia to 20 units then increase by 3 units every 5 days until fasting glucose 100-120.  Add actos. Print off good rx.  Any basal insulin lantus/toujeo/tresiba/basaglar

## 2017-02-15 ENCOUNTER — Encounter: Payer: Self-pay | Admitting: Physician Assistant

## 2017-02-19 ENCOUNTER — Other Ambulatory Visit: Payer: Self-pay | Admitting: *Deleted

## 2017-02-19 MED ORDER — METFORMIN HCL 1000 MG PO TABS: 1000.0000 mg | ORAL_TABLET | Freq: Two times a day (BID) | ORAL | 1 refills | Status: DC

## 2017-03-03 ENCOUNTER — Other Ambulatory Visit: Payer: Self-pay | Admitting: Physician Assistant

## 2017-03-04 ENCOUNTER — Other Ambulatory Visit: Payer: Self-pay | Admitting: Physician Assistant

## 2017-03-05 ENCOUNTER — Encounter: Payer: Self-pay | Admitting: *Deleted

## 2017-03-06 ENCOUNTER — Other Ambulatory Visit: Payer: Self-pay | Admitting: *Deleted

## 2017-03-06 MED ORDER — ALBUTEROL SULFATE HFA 108 (90 BASE) MCG/ACT IN AERS: 2.0000 | INHALATION_SPRAY | Freq: Four times a day (QID) | RESPIRATORY_TRACT | 3 refills | Status: DC | PRN

## 2017-03-17 ENCOUNTER — Other Ambulatory Visit: Payer: Self-pay | Admitting: *Deleted

## 2017-03-17 MED ORDER — ALBUTEROL SULFATE HFA 108 (90 BASE) MCG/ACT IN AERS: 2.0000 | INHALATION_SPRAY | Freq: Four times a day (QID) | RESPIRATORY_TRACT | 0 refills | Status: DC | PRN

## 2017-03-23 ENCOUNTER — Encounter: Payer: Self-pay | Admitting: *Deleted

## 2017-03-30 ENCOUNTER — Other Ambulatory Visit: Payer: Self-pay | Admitting: Physician Assistant

## 2017-04-20 ENCOUNTER — Other Ambulatory Visit: Payer: Self-pay | Admitting: *Deleted

## 2017-04-20 DIAGNOSIS — E1165 Type 2 diabetes mellitus with hyperglycemia: Secondary | ICD-10-CM

## 2017-04-20 DIAGNOSIS — E118 Type 2 diabetes mellitus with unspecified complications: Principal | ICD-10-CM

## 2017-04-20 DIAGNOSIS — IMO0002 Reserved for concepts with insufficient information to code with codable children: Secondary | ICD-10-CM

## 2017-04-20 MED ORDER — PIOGLITAZONE HCL 30 MG PO TABS: 30.0000 mg | ORAL_TABLET | Freq: Every day | ORAL | 3 refills | Status: DC

## 2017-04-20 NOTE — Progress Notes (Unsigned)
Actos faxed to [facility] at [phone].

## 2017-06-29 ENCOUNTER — Other Ambulatory Visit: Payer: Self-pay | Admitting: Physician Assistant

## 2017-06-29 DIAGNOSIS — I10 Essential (primary) hypertension: Secondary | ICD-10-CM

## 2017-07-06 ENCOUNTER — Other Ambulatory Visit: Payer: Self-pay | Admitting: *Deleted

## 2017-07-06 MED ORDER — LISINOPRIL 5 MG PO TABS: 5.0000 mg | ORAL_TABLET | Freq: Every day | ORAL | 0 refills | Status: DC

## 2017-07-15 ENCOUNTER — Other Ambulatory Visit: Payer: Self-pay | Admitting: Physician Assistant

## 2017-07-24 ENCOUNTER — Other Ambulatory Visit: Payer: Self-pay | Admitting: Physician Assistant

## 2017-08-03 ENCOUNTER — Other Ambulatory Visit: Payer: Self-pay | Admitting: Physician Assistant

## 2017-08-20 ENCOUNTER — Other Ambulatory Visit: Payer: Self-pay | Admitting: Physician Assistant

## 2017-08-26 ENCOUNTER — Ambulatory Visit (INDEPENDENT_AMBULATORY_CARE_PROVIDER_SITE_OTHER): Payer: Self-pay | Admitting: Physician Assistant

## 2017-08-26 ENCOUNTER — Encounter: Payer: Self-pay | Admitting: Physician Assistant

## 2017-08-26 VITALS — BP 138/65 | HR 91 | Wt 273.0 lb

## 2017-08-26 DIAGNOSIS — H6123 Impacted cerumen, bilateral: Secondary | ICD-10-CM

## 2017-08-26 DIAGNOSIS — E118 Type 2 diabetes mellitus with unspecified complications: Secondary | ICD-10-CM

## 2017-08-26 DIAGNOSIS — E7800 Pure hypercholesterolemia, unspecified: Secondary | ICD-10-CM

## 2017-08-26 DIAGNOSIS — F1921 Other psychoactive substance dependence, in remission: Secondary | ICD-10-CM

## 2017-08-26 DIAGNOSIS — IMO0002 Reserved for concepts with insufficient information to code with codable children: Secondary | ICD-10-CM

## 2017-08-26 DIAGNOSIS — Z1231 Encounter for screening mammogram for malignant neoplasm of breast: Secondary | ICD-10-CM

## 2017-08-26 DIAGNOSIS — Z0000 Encounter for general adult medical examination without abnormal findings: Secondary | ICD-10-CM

## 2017-08-26 DIAGNOSIS — E1165 Type 2 diabetes mellitus with hyperglycemia: Secondary | ICD-10-CM

## 2017-08-26 LAB — POCT GLYCOSYLATED HEMOGLOBIN (HGB A1C): HEMOGLOBIN A1C: 6.9

## 2017-08-26 NOTE — Progress Notes (Signed)
Subjective:     Kathleen Le is a 56 y.o. female and is here for a comprehensive physical exam. The patient reports problems - she wants to lose weight. she is not eating right and not exercising.   Pt is taking tresbia along with 4 other orals. Not checking sugars regularly. Denies any hypoglycemia. Trying to limit carbs and sugars.    Social History   Social History  . Marital status: Married    Spouse name: N/A  . Number of children: N/A  . Years of education: N/A   Occupational History  . Not on file.   Social History Main Topics  . Smoking status: Current Every Day Smoker  . Smokeless tobacco: Never Used  . Alcohol use No  . Drug use: No  . Sexual activity: Yes   Other Topics Concern  . Not on file   Social History Narrative  . No narrative on file   Health Maintenance  Topic Date Due  . Hepatitis C Screening  09/05/1961  . HIV Screening  02/06/1978  . MAMMOGRAM  03/01/2016  . FOOT EXAM  01/03/2017  . OPHTHALMOLOGY EXAM  02/01/2017  . HEMOGLOBIN A1C  05/11/2017  . INFLUENZA VACCINE  08/29/2018 (Originally 07/29/2017)  . PAP SMEAR  01/24/2018  . PNEUMOCOCCAL POLYSACCHARIDE VACCINE (2) 02/03/2019  . TETANUS/TDAP  02/04/2024  . COLONOSCOPY  03/08/2024    The following portions of the patient's history were reviewed and updated as appropriate: allergies, current medications, past family history, past medical history, past social history, past surgical history and problem list.  Review of Systems Pertinent items noted in HPI and remainder of comprehensive ROS otherwise negative.   Objective:    BP 138/65   Pulse 91   Wt 273 lb (123.8 kg)   BMI 45.43 kg/m  General appearance: alert, cooperative, appears stated age and morbidly obese Head: Normocephalic, without obvious abnormality, atraumatic Eyes: conjunctivae/corneas clear. PERRL, EOM's intact. Fundi benign. Ears: normal TM's and external ear canals both ears and bilateral cerumen impacted. after iirgation  TM's clear.  Nose: Nares normal. Septum midline. Mucosa normal. No drainage or sinus tenderness. Throat: lips, mucosa, and tongue normal; teeth and gums normal Neck: no adenopathy, no carotid bruit, no JVD, supple, symmetrical, trachea midline and thyroid not enlarged, symmetric, no tenderness/mass/nodules Back: symmetric, no curvature. ROM normal. No CVA tenderness. Lungs: clear to auscultation bilaterally Breasts: normal appearance, no masses or tenderness Heart: regular rate and rhythm, S1, S2 normal, no murmur, click, rub or gallop Abdomen: soft, non-tender; bowel sounds normal; no masses,  no organomegaly Pelvic: cervix normal in appearance, external genitalia normal, no adnexal masses or tenderness, no cervical motion tenderness, uterus normal size, shape, and consistency and vagina normal without discharge Extremities: extremities normal, atraumatic, no cyanosis or edema Pulses: 2+ and symmetric Skin: Skin color, texture, turgor normal. No rashes or lesions Lymph nodes: Cervical, supraclavicular, and axillary nodes normal. Neurologic: Alert and oriented X 3, normal strength and tone. Normal symmetric reflexes. Normal coordination and gait    Assessment:    Healthy female exam.      Plan:  [name] KitchenTangee was seen today for diabetes and annual exam.  Diagnoses and all orders for this visit:  Routine physical examination -     Lipid panel -     COMPLETE METABOLIC PANEL WITH GFR  Uncontrolled type 2 diabetes mellitus with complication, without long-term current use of insulin (HCC) -     Flu Vaccine QUAD 6+ mos PF IM (Fluarix Quad PF) -  POCT HgB A1C -     COMPLETE METABOLIC PANEL WITH GFR  Visit for screening mammogram -     MM SCREENING BREAST TOMO BILATERAL; Future  Bilateral impacted cerumen  Pure hypercholesterolemia -     Lipid panel  Medication addiction in remission [facility])  Morbid obesity ([place])     Mammogram ordered.  Pap up to date.    [name].Indication: Cerumen  impaction of the ear(s)  Medical necessity statement: On physical examination, cerumen impairs clinically significant portions of the external auditory canal, and tympanic membrane. Noted obstructive, copious cerumen that cannot be removed without magnification and instrumentations requiring physician skills Consent: Discussed benefits and risks of procedure and verbal consent obtained Procedure: Patient was prepped for the procedure. Utilized an otoscope to assess and take note of the ear canal, the tympanic membrane, and the presence, amount, and placement of the cerumen. Gentle water irrigation and soft plastic curette was utilized to remove cerumen.  Post procedure examination: shows cerumen was completely removed. Patient tolerated procedure well. The patient is made aware that they may experience temporary vertigo, temporary hearing loss, and temporary discomfort. If these symptom last for more than 24 hours to call the clinic or proceed to the ED.  .. Lab Results  Component Value Date   HGBA1C 6.9 08/26/2017   SO MUCH BETTER.  Way to go.  Pt declined flu shot.   Mammogram ordered.  Pap up to date.  .. Discussed 150 minutes of exercise a week.  Encouraged vitamin D 1000 units and Calcium 1300mg  or 4 servings of dairy a day.  [name].Discussed low carb diet with 1500 calories and 80g of protein.  Exercising at least 150 minutes a week.  My [employer]itness Pal could be a Microbiologist.   Encouraged annual eye exam.   .   .. Diabetic Foot Exam - Simple   Simple Foot Form Diabetic Foot exam was performed with the following findings:  Yes   Visual Inspection No deformities, no ulcerations, no other skin breakdown bilaterally:  Yes Sensation Testing See comments:  Yes Pulse Check Posterior Tibialis and Dorsalis pulse intact bilaterally:  Yes Comments Pt did not feel any sensations over great toe or ball of foot bilaterally. She has ongoing neuropathy.      See After Visit Summary for  Counseling Recommendations

## 2017-08-26 NOTE — Patient Instructions (Addendum)
Keep up the good work. Make sure not pushing sugars too low. Back off to 16 units at bedtime.  Decrease Elavil to 1/2 tablet daily.   Keeping You Healthy  Get These Tests  Blood Pressure- Have your blood pressure checked by your healthcare provider at least once a year.  Normal blood pressure is 120/80.  Weight- Have your body mass index (BMI) calculated to screen for obesity.  BMI is a measure of body fat based on height and weight.  You can calculate your own BMI at [employer]  Cholesterol- Have your cholesterol checked every year.  Diabetes- Have your blood sugar checked every year if you have high blood pressure, high cholesterol, a family history of diabetes or if you are overweight.  Pap Test - Have a pap test every 1 to 5 years if you have been sexually active.  If you are older than 65 and recent pap tests have been normal you may not need additional pap tests.  In addition, if you have had a hysterectomy  for benign disease additional pap tests are not necessary.  Mammogram-Yearly mammograms are essential for early detection of breast cancer  Screening for Colon Cancer- Colonoscopy starting at age 12. Screening may begin sooner depending on your family history and other health conditions.  Follow up colonoscopy as directed by your Gastroenterologist.  Screening for Osteoporosis- Screening begins at age 35 with bone density scanning, sooner if you are at higher risk for developing Osteoporosis.  Get these medicines  Calcium with Vitamin D- Your body requires 1200-1500 mg of Calcium a day and [phone] IU of Vitamin D a day.  You can only absorb 500 mg of Calcium at a time therefore Calcium must be taken in 2 or 3 separate doses throughout the day.  Hormones- Hormone therapy has been associated with increased risk for certain cancers and heart disease.  Talk to your healthcare provider about if you need relief from menopausal symptoms.  Aspirin- Ask your healthcare  provider about taking Aspirin to prevent Heart Disease and Stroke.  Get these Immuniztions  Flu shot- Every fall  Pneumonia shot- Once after the age of 32; if you are younger ask your healthcare provider if you need a pneumonia shot.  Tetanus- Every ten years.  Zostavax- Once after the age of 2 to prevent shingles.  Take these steps  Don't smoke- Your healthcare provider can help you quit. For tips on how to quit, ask your healthcare provider or go to www.smokefree.gov or call 1-800 QUIT-NOW.  Be physically active- Exercise 5 days a week for a minimum of 30 minutes.  If you are not already physically active, start slow and gradually work up to 30 minutes of moderate physical activity.  Try walking, dancing, bike riding, swimming, etc.  Eat a healthy diet- Eat a variety of healthy foods such as fruits, vegetables, whole grains, low fat milk, low fat cheeses, yogurt, lean meats, chicken, fish, eggs, dried beans, tofu, etc.  For more information go to www.thenutritionsource.org  Dental visit- Brush and floss teeth twice daily; visit your dentist twice a year.  Eye exam- Visit your Optometrist or Ophthalmologist yearly.  Drink alcohol in moderation- Limit alcohol intake to one drink or less a day.  Never drink and drive.  Depression- Your emotional health is as important as your physical health.  If you're feeling down or losing interest in things you normally enjoy, please talk to your healthcare provider.  Seat Belts- can save your life; always wear one  Smoke/Carbon Monoxide detectors- These detectors need to be installed on the appropriate level of your home.  Replace batteries at least once a year.  Violence- If anyone is threatening or hurting you, please tell your healthcare provider.  Living Will/ Health care power of attorney- Discuss with your healthcare provider and family.

## 2017-08-28 LAB — COMPLETE METABOLIC PANEL WITH GFR
ALBUMIN: 3.8 g/dL (ref 3.6–5.1)
ALK PHOS: 85 U/L (ref 33–130)
ALT: 14 U/L (ref 6–29)
AST: 12 U/L (ref 10–35)
BILIRUBIN TOTAL: 0.2 mg/dL (ref 0.2–1.2)
BUN: 20 mg/dL (ref 7–25)
CO2: 26 mmol/L (ref 20–32)
Calcium: 9.2 mg/dL (ref 8.6–10.4)
Chloride: 100 mmol/L (ref 98–110)
Creat: 0.91 mg/dL (ref 0.50–1.05)
GFR, Est African American: 83 mL/min (ref >=60)
GFR, Est Non African American: 72 mL/min (ref >=60)
GLUCOSE: 125 mg/dL — AB (ref 65–99)
Potassium: 4.3 mmol/L (ref 3.5–5.3)
SODIUM: 139 mmol/L (ref 135–146)
TOTAL PROTEIN: 6.5 g/dL (ref 6.1–8.1)

## 2017-08-28 LAB — LIPID PANEL
CHOL/HDL RATIO: 3.2 ratio (ref <5.0)
Cholesterol: 174 mg/dL (ref <200)
HDL: 55 mg/dL (ref >50)
LDL CALC: 90 mg/dL (ref <100)
TRIGLYCERIDES: 143 mg/dL (ref <150)
VLDL: 29 mg/dL (ref <30)

## 2017-08-28 NOTE — Progress Notes (Signed)
Cmp looks great.

## 2017-08-31 DIAGNOSIS — E66813 Obesity, class 3: Secondary | ICD-10-CM

## 2017-08-31 DIAGNOSIS — Z6841 Body Mass Index (BMI) 40.0 and over, adult: Secondary | ICD-10-CM | POA: Insufficient documentation

## 2017-08-31 HISTORY — DX: Obesity, class 3: E66.813

## 2017-08-31 HISTORY — DX: Body Mass Index (BMI) 40.0 and over, adult: Z684

## 2017-09-01 ENCOUNTER — Other Ambulatory Visit: Payer: Self-pay | Admitting: Physician Assistant

## 2017-09-01 DIAGNOSIS — I10 Essential (primary) hypertension: Secondary | ICD-10-CM

## 2017-09-19 ENCOUNTER — Other Ambulatory Visit: Payer: Self-pay | Admitting: Physician Assistant

## 2017-09-29 ENCOUNTER — Other Ambulatory Visit: Payer: Self-pay | Admitting: Physician Assistant

## 2017-10-22 ENCOUNTER — Encounter: Payer: Self-pay | Admitting: Physician Assistant

## 2017-10-22 ENCOUNTER — Ambulatory Visit (INDEPENDENT_AMBULATORY_CARE_PROVIDER_SITE_OTHER): Payer: Self-pay | Admitting: Physician Assistant

## 2017-10-22 VITALS — BP 116/75 | HR 87 | Wt 265.0 lb

## 2017-10-22 DIAGNOSIS — F411 Generalized anxiety disorder: Secondary | ICD-10-CM

## 2017-10-22 DIAGNOSIS — F5105 Insomnia due to other mental disorder: Secondary | ICD-10-CM

## 2017-10-22 DIAGNOSIS — F99 Mental disorder, not otherwise specified: Secondary | ICD-10-CM

## 2017-10-22 DIAGNOSIS — Z87898 Personal history of other specified conditions: Secondary | ICD-10-CM

## 2017-10-22 DIAGNOSIS — F331 Major depressive disorder, recurrent, moderate: Secondary | ICD-10-CM

## 2017-10-22 DIAGNOSIS — F1911 Other psychoactive substance abuse, in remission: Secondary | ICD-10-CM

## 2017-10-22 MED ORDER — DOXEPIN HCL 25 MG PO CAPS: 25.0000 mg | ORAL_CAPSULE | Freq: Every day | ORAL | 3 refills | Status: DC

## 2017-10-22 NOTE — Progress Notes (Signed)
HPI:                                                                Kathleen Le is a 56 y.o. female who presents to [facility] today for anxiety / sleep disturbance  This pleasant 56 yo F with PMH of emphysemia, HTN, DM II, morbid obesity, depression, insomnia presents today with anxiety and sleep disturbance gradually worsening for 6 weeks. She states she is having difficulty falling asleep, multiple nighttime awakenings, and poor sleep quality. Reports waking up in the middle of the night gasping for air and has to go outside to catch her breath.  Previous medications: Amitriptyline x 6 years, recently tapered off; Cymbalta Using Albuterol 2 puffs, 5-6 times per day  Past Medical History:  Diagnosis Date  . Asthma   . Diabetes mellitus without complication ([place])   . Hypertension    Past Surgical History:  Procedure Laterality Date  . Gerster     Social History  Substance Use Topics  . Smoking status: Current Every Day Smoker  . Smokeless tobacco: Never Used  . Alcohol use No   family history includes Cancer in her maternal grandmother, maternal uncle, mother, and paternal grandmother; Diabetes in her father, paternal grandfather, paternal grandmother, paternal uncle, and paternal uncle; Heart attack in her father, maternal aunt, and mother.  ROS: Review of Systems  Constitutional: Positive for diaphoresis and malaise/fatigue.  Respiratory: Positive for shortness of breath. Negative for cough, hemoptysis and sputum production.   Cardiovascular: Positive for PND. Negative for chest pain and palpitations.  Gastrointestinal: Positive for nausea.  Musculoskeletal: Positive for myalgias.  Neurological: Positive for tremors, sensory change (neuropathy) and headaches.  Psychiatric/Behavioral: Positive for depression. Negative for substance abuse. The patient is nervous/anxious and has insomnia.      Medications: Current Outpatient  Prescriptions  Medication Sig Dispense Refill  . albuterol (PROAIR HFA) 108 (90 Base) MCG/ACT inhaler Inhale 2 puffs into the lungs every 6 (six) hours as needed for wheezing or shortness of breath. 1 Inhaler 0  . AMBULATORY NON FORMULARY MEDICATION Blood sugar testing strips and lancets for TrueTrack glucometer.  Use to check blood sugar up to two times a day.  Dx: Type 2 Diabetes 100 Units 4  . AMBULATORY NON FORMULARY MEDICATION Please provide needles for Tresiba pens 100 each 3  . amLODipine (NORVASC) 5 MG tablet Take 1 tablet (5 mg total) by mouth daily. 90 tablet 0  . aspirin 81 MG tablet Take 81 mg by mouth daily.    [name] atenolol (TENORMIN) 50 MG tablet TAKE 1 TABLET BY MOUTH ONCE DAILY, DUE FOR FOLLOW UP VISIT 60 tablet 0  . Flaxseed, Linseed, (FLAX SEED OIL) 1000 MG CAPS Take 1,000 mg by mouth 2 (two) times daily.    . furosemide (LASIX) 20 MG tablet Take 1 tablet (20 mg total) by mouth as needed. 90 tablet 1  . glipiZIDE (GLIPIZIDE XL) 10 MG 24 hr tablet Take 1 tablet (10 mg total) by mouth daily with breakfast. 90 tablet 0  . hydrochlorothiazide (HYDRODIURIL) 25 MG tablet Take one tablet by mouth once daily 30 tablet 1  . Insulin Degludec (TRESIBA FLEXTOUCH) 100 UNIT/ML SOPN Inject 10 Units into the skin at bedtime. Increase by  2 units every 3 days until fasting glucose is 120 or under.  Please add needles if needed. 2 pen 5  . lisinopril (PRINIVIL,ZESTRIL) 5 MG tablet Take 1 tablet (5 mg total) by mouth daily. 90 tablet 0  . metFORMIN (GLUCOPHAGE) 1000 MG tablet Take one tablet twice daily with meals 60 tablet 0  . nystatin cream (MYCOSTATIN) Apply 1 application topically 2 (two) times daily. 80 g 0  . Omega-3 Fatty Acids (FISH OIL) 1000 MG CAPS Take 1,000 mg by mouth 2 (two) times daily.    [name] omeprazole (PRILOSEC) 40 MG capsule Take 1 capsule (40 mg total) by mouth daily. 90 capsule 1  . pioglitazone (ACTOS) 30 MG tablet Take 1 tablet (30 mg total) by mouth daily. 90 tablet 3  .  Probiotic Product (PROBIOTIC DAILY PO) Take by mouth daily.    . simvastatin (ZOCOR) 20 MG tablet take 1 tablet by mouth once daily 90 tablet 0  . sitaGLIPtin (JANUVIA) 100 MG tablet Take 1 tablet (100 mg total) by mouth daily. 90 tablet 1   No current facility-administered medications for this visit.    Allergies  Allergen Reactions  . Gabapentin     Nauseated/feeling sick  . Invokana [Canagliflozin]     Vaginitis   . Lyrica [Pregabalin]     Pain in feet increased and spasm       Objective:  BP 116/75   Pulse 87   Wt 265 lb (120.2 kg)   BMI 44.10 kg/m  Gen:  alert, not ill-appearing, no distress, appropriate for age, morbidly obese female HEENT: head normocephalic without obvious abnormality, conjunctiva and cornea clear, trachea midline Pulm: Normal work of breathing, normal phonation, clear to auscultation bilaterally, no wheezes, rales or rhonchi CV: Normal rate, regular rhythm, s1 and s2 distinct, no murmurs, clicks or rubs  Neuro: alert and oriented x 3, no tremor MSK: extremities atraumatic, normal gait and station Skin: intact, no rashes on exposed skin, no jaundice, no cyanosis Psych: well-groomed, cooperative, good eye contact, depressed mood, affect mood-congruent, tearful, speech is articulate, and thought processes clear and goal-directed  Depression screen [facility] 2/9 10/22/2017  Decreased Interest 3  Down, Depressed, Hopeless 2  PHQ - 2 Score 5  Altered sleeping 3  Tired, decreased energy 3  Change in appetite 2  Feeling bad or failure about yourself  1  Trouble concentrating 2  Moving slowly or fidgety/restless 3  Suicidal thoughts 0  PHQ-9 Score 19   GAD 7 : Generalized Anxiety Score 10/22/2017  Nervous, Anxious, on Edge 3  Control/stop worrying 3  Worry too much - different things 3  Trouble relaxing 3  Restless 3  Easily annoyed or irritable 1  Afraid - awful might happen 3  Total GAD 7 Score 19  Anxiety Difficulty Extremely difficult       No results found for this or any previous visit (from the past 29 hour(s)). No results found.    Assessment and Plan: 56 y.o. female with   1. GAD (generalized anxiety disorder) - GAD7 score 19, severe - patient reports history of substance abuse and does not want Benzodiazepines. Hesitant to start an SSRI/SNRI with Doxepin. Recommend close follow-up with PCP in 2 weeks to discuss re-starting Cymbalta  2. History of substance abuse   3. Moderate episode of recurrent major depressive disorder (HCC) - PHQ9 score 19, no acute safety issues - patient does not want to re-start Amitriptyline due to weight gain - doxepin (SINEQUAN) 25 MG capsule; Take  1 capsule (25 mg total) by mouth at bedtime.  Dispense: 30 capsule; Refill: 3  4. Insomnia due to other mental disorder - trial Doxepin - sleep hygiene  - doxepin (SINEQUAN) 25 MG capsule; Take 1 capsule (25 mg total) by mouth at bedtime.  Dispense: 30 capsule; Refill: 3   Patient education and anticipatory guidance given Patient agrees with treatment plan Follow-up in 2 weeks or sooner as needed if symptoms worsen or fail to improve  Darlyne Russian PA-C

## 2017-10-22 NOTE — Patient Instructions (Signed)
-   start Doxepin 1 capsule at bedtime - YouTube guided meditation and relaxation exercises - sleep hygiene  - close follow-up in 1 week  Sleep Hygiene . Limiting daytime naps to 30 minutes . Napping does not make up for inadequate nighttime sleep. However, a short nap of 20-30 minutes can help to improve mood, alertness and performance.  . Avoiding stimulants such as  caffeine and nicotine close to bedtime.  And when it comes to alcohol, moderation is key 4. While alcohol is well-known to help you fall asleep faster, too much close to bedtime can disrupt sleep in the second half of the night as the body begins to process the alcohol.    . Exercising to promote good quality sleep.  As little as 10 minutes of aerobic exercise, such as walking or cycling, can drastically improve nighttime sleep quality.  For the best night's sleep, most people should avoid strenuous workouts close to bedtime. However, the effect of intense nighttime exercise on sleep differs from person to person, so find out what works best for you.   . Steering clear of food that can be disruptive right before sleep.   Heavy or rich foods, fatty or fried meals, spicy dishes, citrus fruits, and carbonated drinks can trigger indigestion for some people. When this occurs close to bedtime, it can lead to painful heartburn that disrupts sleep. . Ensuring adequate exposure to natural light.  This is particularly important for individuals who may not venture outside frequently. Exposure to sunlight during the day, as well as darkness at night, helps to maintain a healthy sleep-wake cycle . [name] Establishing a regular relaxing bedtime routine.  A regular nightly routine helps the body recognize that it is bedtime. This could include taking warm shower or bath, reading a book, or light stretches. When possible, try to avoid emotionally upsetting conversations and activities before attempting to sleep. . Making sure that the sleep environment is  pleasant.  Mattress and pillows should be comfortable. The bedroom should be cool - between 60 and 67 degrees - for optimal sleep. Bright light from lamps, cell phone and TV screens can make it difficult to fall asleep4, so turn those light off or adjust them when possible. Consider using blackout curtains, eye shades, ear plugs, "white noise" machines, humidifiers, fans and other devices that can make the bedroom more relaxing.

## 2017-10-23 ENCOUNTER — Other Ambulatory Visit: Payer: Self-pay | Admitting: Physician Assistant

## 2017-10-23 DIAGNOSIS — I10 Essential (primary) hypertension: Secondary | ICD-10-CM

## 2017-10-27 DIAGNOSIS — F5105 Insomnia due to other mental disorder: Secondary | ICD-10-CM

## 2017-10-27 DIAGNOSIS — F1911 Other psychoactive substance abuse, in remission: Secondary | ICD-10-CM

## 2017-10-27 DIAGNOSIS — F99 Mental disorder, not otherwise specified: Secondary | ICD-10-CM

## 2017-10-27 DIAGNOSIS — F411 Generalized anxiety disorder: Secondary | ICD-10-CM

## 2017-10-27 HISTORY — DX: Other psychoactive substance abuse, in remission: F19.11

## 2017-10-27 HISTORY — DX: Mental disorder, not otherwise specified: F51.05

## 2017-10-27 HISTORY — DX: Generalized anxiety disorder: F41.1

## 2017-11-06 ENCOUNTER — Encounter: Payer: Self-pay | Admitting: Physician Assistant

## 2017-11-06 ENCOUNTER — Ambulatory Visit (INDEPENDENT_AMBULATORY_CARE_PROVIDER_SITE_OTHER): Payer: Self-pay | Admitting: Physician Assistant

## 2017-11-06 VITALS — BP 142/73 | HR 92 | Ht 65.0 in | Wt 264.0 lb

## 2017-11-06 DIAGNOSIS — I10 Essential (primary) hypertension: Secondary | ICD-10-CM

## 2017-11-06 DIAGNOSIS — Z794 Long term (current) use of insulin: Secondary | ICD-10-CM

## 2017-11-06 DIAGNOSIS — F5105 Insomnia due to other mental disorder: Secondary | ICD-10-CM

## 2017-11-06 DIAGNOSIS — E119 Type 2 diabetes mellitus without complications: Secondary | ICD-10-CM

## 2017-11-06 DIAGNOSIS — R600 Localized edema: Secondary | ICD-10-CM

## 2017-11-06 DIAGNOSIS — F99 Mental disorder, not otherwise specified: Secondary | ICD-10-CM

## 2017-11-06 LAB — POCT GLYCOSYLATED HEMOGLOBIN (HGB A1C): Hemoglobin A1C: 6.9

## 2017-11-06 MED ORDER — DOXEPIN HCL 50 MG PO CAPS: 50.0000 mg | ORAL_CAPSULE | Freq: Every day | ORAL | 5 refills | Status: DC

## 2017-11-06 MED ORDER — LIRAGLUTIDE 18 MG/3ML ~~LOC~~ SOPN: PEN_INJECTOR | SUBCUTANEOUS | 3 refills | Status: DC

## 2017-11-06 NOTE — Progress Notes (Signed)
Subjective:    Patient ID: Kathleen Le, female    DOB: [birth year], 56 y.o.   MRN: [id number]  HPI  Pt is a 56 yo pleasant female who presents to the clinic for follow up.   DM- doing well. Lost 13lbs. Per patient sugars in am running 100 to 115. On tresbia 16 units at bedtime and victoza 1.8mg  with orals. No open wounds. No hypoglycemic events.   She was recently started on doxepin for sleep. It is helping. She feels very claustraphobic at night and this helps to calm her down. She gets about 5 hours a sleep a night. She wonders if she could increase the medication.  She does admit she recently came out amitripyline that she had been on for years and wonders if that could have causes her acute sleep symptoms.   .. Active Ambulatory Problems    Diagnosis Date Noted  . Type II diabetes mellitus, uncontrolled ([place]) 02/03/2014  . Unspecified asthma(493.90) 02/03/2014  . Essential hypertension, benign 02/03/2014  . Neuropathy 02/05/2014  . IT band syndrome 02/05/2014  . Sinus tachycardia 02/05/2014  . Memory changes 02/05/2014  . Word finding difficulty 02/05/2014  . Cervical cancer ([place]) 02/05/2014  . Depression 05/19/2014  . Anxiety 05/19/2014  . Low back pain with radiation 05/19/2014  . DDD (degenerative disc disease), lumbar 05/19/2014  . PTSD (post-traumatic stress disorder) 09/26/2014  . Foot callus 10/03/2014  . Gastroesophageal reflux disease with esophagitis 01/10/2015  . Cerumen impaction 01/24/2015  . Hyperlipidemia 01/24/2015  . Pulmonary emphysema ([place]) 07/26/2012  . Morbid obesity ([place]) 08/31/2017  . Insomnia due to other mental disorder 10/27/2017  . History of substance abuse 10/27/2017  . GAD (generalized anxiety disorder) 10/27/2017  . Essential hypertension 11/10/2017  . Lower leg edema 11/10/2017   Resolved Ambulatory Problems    Diagnosis Date Noted  . Bruised rib 09/26/2014  . Bruised ribs 09/26/2014  . Wound of right leg 09/26/2014   Past Medical  History:  Diagnosis Date  . Asthma   . Diabetes mellitus without complication ([place])   . History of alcohol abuse   . History of marijuana use   . Hypertension   . Substance abuse ([place])       Review of Systems  All other systems reviewed and are negative.      Objective:   Physical Exam  Constitutional: She is oriented to person, place, and time. She appears well-developed and well-nourished.  Obese.   HENT:  Head: Normocephalic and atraumatic.  Cardiovascular: Normal rate, regular rhythm and normal heart sounds.  Pulmonary/Chest: Effort normal and breath sounds normal.  Neurological: She is alert and oriented to person, place, and time.  Psychiatric: She has a normal mood and affect. Her behavior is normal.          Assessment & Plan:  [name] KitchenZyrah was seen today for follow-up.  Diagnoses and all orders for this visit:  Diabetes mellitus, type II, insulin dependent (HCC) -     liraglutide (VICTOZA) 18 MG/3ML SOPN; Started 0.6 mg injected subcutaneously daily for one week, then increase to 1.2 mg injected subcutaneously daily.  Please include needles. -     POCT HgB A1C -     simvastatin (ZOCOR) 20 MG tablet; Take 1 tablet (20 mg total) daily by mouth. -     sitaGLIPtin (JANUVIA) 100 MG tablet; Take 1 tablet (100 mg total) daily by mouth. -     metFORMIN (GLUCOPHAGE) 1000 MG tablet; Take 1 tablet (1,000 mg total)  2 (two) times daily with a meal by mouth. -     lisinopril (PRINIVIL,ZESTRIL) 5 MG tablet; Take 1 tablet (5 mg total) daily by mouth. -     glipiZIDE (GLIPIZIDE XL) 10 MG 24 hr tablet; Take 1 tablet (10 mg total) daily with breakfast by mouth. -     pioglitazone (ACTOS) 30 MG tablet; Take 1 tablet (30 mg total) daily by mouth.  Insomnia due to other mental disorder -     doxepin (SINEQUAN) 50 MG capsule; Take 1 capsule (50 mg total) at bedtime by mouth.  Essential hypertension -     hydrochlorothiazide (HYDRODIURIL) 25 MG tablet; Take one tablet by mouth once  daily -     amLODipine (NORVASC) 5 MG tablet; Take 1 tablet (5 mg total) daily by mouth.  Lower leg edema -     furosemide (LASIX) 20 MG tablet; Take 1 tablet (20 mg total) as needed by mouth.     .. Results for orders placed or performed in visit on 11/06/17  POCT HgB A1C  Result Value Ref Range   Hemoglobin A1C 6.9    a1c under 7. On statin.  BP controlled on ACE.  On ASA. Continue on medications as listed above.  Follow up in 3 months.   Increased doxepin since helping but still not sleeping like she wants to.

## 2017-11-10 DIAGNOSIS — R600 Localized edema: Secondary | ICD-10-CM

## 2017-11-10 DIAGNOSIS — I10 Essential (primary) hypertension: Secondary | ICD-10-CM | POA: Insufficient documentation

## 2017-11-10 HISTORY — DX: Localized edema: R60.0

## 2017-11-10 MED ORDER — AMLODIPINE BESYLATE 5 MG PO TABS: 5.0000 mg | ORAL_TABLET | Freq: Every day | ORAL | 1 refills | Status: DC

## 2017-11-10 MED ORDER — METFORMIN HCL 1000 MG PO TABS: 1000.0000 mg | ORAL_TABLET | Freq: Two times a day (BID) | ORAL | 1 refills | Status: DC

## 2017-11-10 MED ORDER — SITAGLIPTIN PHOSPHATE 100 MG PO TABS: 100.0000 mg | ORAL_TABLET | Freq: Every day | ORAL | 1 refills | Status: DC

## 2017-11-10 MED ORDER — GLIPIZIDE ER 10 MG PO TB24: 10.0000 mg | ORAL_TABLET | Freq: Every day | ORAL | 1 refills | Status: DC

## 2017-11-10 MED ORDER — PIOGLITAZONE HCL 30 MG PO TABS: 30.0000 mg | ORAL_TABLET | Freq: Every day | ORAL | 1 refills | Status: DC

## 2017-11-10 MED ORDER — HYDROCHLOROTHIAZIDE 25 MG PO TABS: ORAL_TABLET | ORAL | 1 refills | Status: DC

## 2017-11-10 MED ORDER — SIMVASTATIN 20 MG PO TABS: 20.0000 mg | ORAL_TABLET | Freq: Every day | ORAL | 3 refills | Status: DC

## 2017-11-10 MED ORDER — FUROSEMIDE 20 MG PO TABS: 20.0000 mg | ORAL_TABLET | ORAL | 1 refills | Status: DC | PRN

## 2017-11-10 MED ORDER — LISINOPRIL 5 MG PO TABS: 5.0000 mg | ORAL_TABLET | Freq: Every day | ORAL | 1 refills | Status: DC

## 2017-11-24 ENCOUNTER — Ambulatory Visit: Payer: Medicaid Other | Admitting: Physician Assistant

## 2017-12-03 ENCOUNTER — Telehealth: Payer: Self-pay

## 2017-12-03 DIAGNOSIS — E119 Type 2 diabetes mellitus without complications: Secondary | ICD-10-CM

## 2017-12-03 DIAGNOSIS — I10 Essential (primary) hypertension: Secondary | ICD-10-CM

## 2017-12-03 DIAGNOSIS — Z794 Long term (current) use of insulin: Principal | ICD-10-CM

## 2017-12-03 NOTE — Telephone Encounter (Signed)
Patient would like to donate blood. She wants to know if it would be ok to give blood with her current medical condition. Please advise.

## 2017-12-04 MED ORDER — AMLODIPINE BESYLATE 5 MG PO TABS: 5.0000 mg | ORAL_TABLET | Freq: Every day | ORAL | 1 refills | Status: DC

## 2017-12-04 MED ORDER — GLIPIZIDE ER 10 MG PO TB24: 10.0000 mg | ORAL_TABLET | Freq: Every day | ORAL | 1 refills | Status: DC

## 2017-12-04 MED ORDER — SIMVASTATIN 20 MG PO TABS: 20.0000 mg | ORAL_TABLET | Freq: Every day | ORAL | 3 refills | Status: DC

## 2017-12-04 NOTE — Telephone Encounter (Signed)
Yes she should be fine.

## 2017-12-04 NOTE — Telephone Encounter (Signed)
Patient advised.

## 2017-12-08 ENCOUNTER — Other Ambulatory Visit: Payer: Self-pay | Admitting: *Deleted

## 2017-12-08 DIAGNOSIS — E119 Type 2 diabetes mellitus without complications: Secondary | ICD-10-CM

## 2017-12-08 DIAGNOSIS — Z794 Long term (current) use of insulin: Principal | ICD-10-CM

## 2017-12-08 MED ORDER — GLIPIZIDE ER 10 MG PO TB24: 10.0000 mg | ORAL_TABLET | Freq: Every day | ORAL | 1 refills | Status: DC

## 2018-01-14 ENCOUNTER — Encounter: Payer: Self-pay | Admitting: Family Medicine

## 2018-01-14 ENCOUNTER — Ambulatory Visit (INDEPENDENT_AMBULATORY_CARE_PROVIDER_SITE_OTHER): Payer: Medicaid Other | Admitting: Family Medicine

## 2018-01-14 VITALS — BP 163/82 | HR 100 | Temp 98.1°F | Ht 65.0 in | Wt 261.0 lb

## 2018-01-14 DIAGNOSIS — J441 Chronic obstructive pulmonary disease with (acute) exacerbation: Secondary | ICD-10-CM

## 2018-01-14 MED ORDER — AZITHROMYCIN 250 MG PO TABS: 250.0000 mg | ORAL_TABLET | Freq: Every day | ORAL | 0 refills | Status: DC

## 2018-01-14 MED ORDER — PREDNISONE 50 MG PO TABS: 50.0000 mg | ORAL_TABLET | Freq: Every day | ORAL | 0 refills | Status: DC

## 2018-01-14 MED ORDER — GUAIFENESIN-CODEINE 100-10 MG/5ML PO SOLN: 5.0000 mL | Freq: Four times a day (QID) | ORAL | 0 refills | Status: DC | PRN

## 2018-01-14 NOTE — Patient Instructions (Signed)
Thank you for coming in today. Take prednisone and azithromycin for 5 days.  Take codeine cough medicine as needed.  Call or go to the emergency room if you get worse, have trouble breathing, have chest pains, or palpitations.  Work on quitting smoking.  Use albuterol as needed.  Call or go to the emergency room if you get worse, have trouble breathing, have chest pains, or palpitations.    Chronic Obstructive Pulmonary Disease Exacerbation Chronic obstructive pulmonary disease (COPD) is a common lung problem. In COPD, the flow of air from the lungs is limited. COPD exacerbations are times that breathing gets worse and you need extra treatment. Without treatment they can be life threatening. If they happen often, your lungs can become more damaged. If your COPD gets worse, your doctor may treat you with:  Medicines.  Oxygen.  Different ways to clear your airway, such as using a mask.  Follow these instructions at home:  Do not smoke.  Avoid tobacco smoke and other things that bother your lungs.  If given, take your antibiotic medicine as told. Finish the medicine even if you start to feel better.  Only take medicines as told by your doctor.  Drink enough fluids to keep your pee (urine) clear or pale yellow (unless your doctor has told you not to).  Use a cool mist machine (vaporizer).  If you use oxygen or a machine that turns liquid medicine into a mist (nebulizer), continue to use them as told.  Keep up with shots (vaccinations) as told by your doctor.  Exercise regularly.  Eat healthy foods.  Keep all doctor visits as told. Get help right away if:  You are very short of breath and it gets worse.  You have trouble talking.  You have bad chest pain.  You have blood in your spit (sputum).  You have a fever.  You keep throwing up (vomiting).  You feel weak, or you pass out (faint).  You feel confused.  You keep getting worse. This information is not intended  to replace advice given to you by your health care provider. Make sure you discuss any questions you have with your health care provider. Document Released: 12/04/2011 Document Revised: 05/22/2016 Document Reviewed: 08/19/2013 Elsevier Interactive Patient Education  2017 [employer].

## 2018-01-14 NOTE — Progress Notes (Signed)
Kathleen Le is a 57 y.o. female who presents to [facility] today for cough congestion wheezing shortness of breath.  Symptoms present for 2 weeks.  Symptoms are consistent with previous episodes of bronchitis.  Patient notes that she continues to smoke.  She denies any vomiting or diarrhea.  She is tried some over-the-counter medications which have not helped much.  She notes that she does not have health insurance.  Nur has been using her albuterol inhaler which does help.   Past Medical History:  Diagnosis Date  . Asthma   . Diabetes mellitus without complication ([place])   . History of alcohol abuse   . History of marijuana use   . Hypertension   . Substance abuse [facility])    Past Surgical History:  Procedure Laterality Date  . DNC     Social History   Tobacco Use  . Smoking status: Current Every Day Smoker  . Smokeless tobacco: Never Used  Substance Use Topics  . Alcohol use: No   family history includes Cancer in her maternal grandmother, maternal uncle, mother, and paternal grandmother; Diabetes in her father, paternal grandfather, paternal grandmother, paternal uncle, and paternal uncle; Heart attack in her father, maternal aunt, and mother.  ROS as above:  Medications: Current Outpatient Medications  Medication Sig Dispense Refill  . albuterol (PROAIR HFA) 108 (90 Base) MCG/ACT inhaler Inhale 2 puffs into the lungs every 6 (six) hours as needed for wheezing or shortness of breath. 1 Inhaler 0  . AMBULATORY NON FORMULARY MEDICATION Blood sugar testing strips and lancets for TrueTrack glucometer.  Use to check blood sugar up to two times a day.  Dx: Type 2 Diabetes 100 Units 4  . AMBULATORY NON FORMULARY MEDICATION Please provide needles for Tresiba pens 100 each 3  . amLODipine (NORVASC) 5 MG tablet Take 1 tablet (5 mg total) by mouth daily. 90 tablet 1    . aspirin 81 MG tablet Take 81 mg by mouth daily.    [name] atenolol (TENORMIN) 50 MG tablet Take 1 tablet (50 mg total) by mouth daily. 30 tablet 3  . doxepin (SINEQUAN) 50 MG capsule Take 1 capsule (50 mg total) at bedtime by mouth. 30 capsule 5  . Flaxseed, Linseed, (FLAX SEED OIL) 1000 MG CAPS Take 1,000 mg by mouth 2 (two) times daily.    . furosemide (LASIX) 20 MG tablet Take 1 tablet (20 mg total) as needed by mouth. 90 tablet 1  . glipiZIDE (GLIPIZIDE XL) 10 MG 24 hr tablet Take 1 tablet (10 mg total) by mouth daily with breakfast. 90 tablet 1  . hydrochlorothiazide (HYDRODIURIL) 25 MG tablet Take one tablet by mouth once daily 90 tablet 1  . Insulin Degludec (TRESIBA FLEXTOUCH) 100 UNIT/ML SOPN Inject 10 Units into the skin at bedtime. Increase by 2 units every 3 days until fasting glucose is 120 or under.  Please add needles if needed. 2 pen 5  . liraglutide (VICTOZA) 18 MG/3ML SOPN Started 0.6 mg injected subcutaneously daily for one week, then increase to 1.2 mg injected subcutaneously daily.  Please include needles. 9 mL 3  . lisinopril (PRINIVIL,ZESTRIL) 5 MG tablet Take 1 tablet (5 mg total) daily by mouth. 90 tablet 1  . metFORMIN (GLUCOPHAGE) 1000 MG tablet Take 1 tablet (1,000 mg total) 2 (two) times daily with a meal by mouth. 180 tablet 1  . Omega-3 Fatty Acids (FISH OIL) 1000 MG CAPS Take 1,000 mg  by mouth 2 (two) times daily.    [name] omeprazole (PRILOSEC) 40 MG capsule Take 1 capsule (40 mg total) by mouth daily. 90 capsule 1  . pioglitazone (ACTOS) 30 MG tablet Take 1 tablet (30 mg total) daily by mouth. 90 tablet 1  . Probiotic Product (PROBIOTIC DAILY PO) Take by mouth daily.    . simvastatin (ZOCOR) 20 MG tablet Take 1 tablet (20 mg total) by mouth daily. 90 tablet 3  . sitaGLIPtin (JANUVIA) 100 MG tablet Take 1 tablet (100 mg total) daily by mouth. 90 tablet 1  . azithromycin (ZITHROMAX) 250 MG tablet Take 1 tablet (250 mg total) by mouth daily. Take first 2 tablets together,  then 1 every day until finished. 6 tablet 0  . guaiFENesin-codeine 100-10 MG/5ML syrup Take 5 mLs by mouth every 6 (six) hours as needed for cough. 120 mL 0  . predniSONE (DELTASONE) 50 MG tablet Take 1 tablet (50 mg total) by mouth daily. 5 tablet 0   No current facility-administered medications for this visit.    Allergies  Allergen Reactions  . Gabapentin     Nauseated/feeling sick  . Invokana [Canagliflozin]     Vaginitis   . Lyrica [Pregabalin]     Pain in feet increased and spasm    Health Maintenance Health Maintenance  Topic Date Due  . Hepatitis C Screening  May 08, 1961  . HIV Screening  02/06/1978  . MAMMOGRAM  03/01/2016  . OPHTHALMOLOGY EXAM  02/01/2017  . INFLUENZA VACCINE  08/29/2018 (Originally 07/29/2017)  . PAP SMEAR  01/24/2018  . HEMOGLOBIN A1C  02/06/2018  . FOOT EXAM  08/26/2018  . PNEUMOCOCCAL POLYSACCHARIDE VACCINE (2) 02/03/2019  . TETANUS/TDAP  02/04/2024  . COLONOSCOPY  03/08/2024     Exam:  BP (!) 163/82   Pulse 100   Temp 98.1 F (36.7 C)   Ht 5\' 5"  (1.651 m)   Wt 261 lb (118.4 kg)   SpO2 95%   BMI 43.43 kg/m  Gen: Well NAD HEENT: EOMI,  MMM clear nasal discharge normal tympanic membranes normal posterior pharynx.  No significant cervical lymphadenopathy. Lungs: Normal work of breathing.  Wheezing and prolonged expiratory phase present bilaterally Heart: RRR no MRG Abd: NABS, Soft. Nondistended, Nontender Exts: Brisk capillary refill, warm and well perfused.    No results found for this or any previous visit (from the past 72 hour(s)). No results found.    Assessment and Plan: 57 y.o. female with bronchitis versus COPD exacerbation.  Plan to treat with prednisone and albuterol azithromycin and codeine cough syrup.  Recheck if needed.  Recommend smoking cessation.   No orders of the defined types were placed in this encounter.  Meds ordered this encounter  Medications  . predniSONE (DELTASONE) 50 MG tablet    Sig: Take 1 tablet  (50 mg total) by mouth daily.    Dispense:  5 tablet    Refill:  0  . azithromycin (ZITHROMAX) 250 MG tablet    Sig: Take 1 tablet (250 mg total) by mouth daily. Take first 2 tablets together, then 1 every day until finished.    Dispense:  6 tablet    Refill:  0  . guaiFENesin-codeine 100-10 MG/5ML syrup    Sig: Take 5 mLs by mouth every 6 (six) hours as needed for cough.    Dispense:  120 mL    Refill:  0     Discussed warning signs or symptoms. Please see discharge instructions. Patient expresses understanding.  I spent 25  minutes with this patient, greater than 50% was face-to-face time counseling regarding ddx and treatment plan.

## 2018-01-29 ENCOUNTER — Other Ambulatory Visit: Payer: Self-pay

## 2018-01-29 DIAGNOSIS — I10 Essential (primary) hypertension: Secondary | ICD-10-CM

## 2018-01-29 MED ORDER — ATENOLOL 50 MG PO TABS: 50.0000 mg | ORAL_TABLET | Freq: Every day | ORAL | 1 refills | Status: DC

## 2018-02-04 ENCOUNTER — Other Ambulatory Visit: Payer: Self-pay | Admitting: *Deleted

## 2018-02-08 ENCOUNTER — Ambulatory Visit: Payer: Medicaid Other | Admitting: Physician Assistant

## 2018-03-08 ENCOUNTER — Encounter: Payer: Self-pay | Admitting: Physician Assistant

## 2018-03-08 ENCOUNTER — Ambulatory Visit (INDEPENDENT_AMBULATORY_CARE_PROVIDER_SITE_OTHER): Payer: Medicaid Other | Admitting: Physician Assistant

## 2018-03-08 VITALS — BP 140/70 | HR 100 | Ht 65.0 in | Wt 262.0 lb

## 2018-03-08 DIAGNOSIS — Z87891 Personal history of nicotine dependence: Secondary | ICD-10-CM

## 2018-03-08 DIAGNOSIS — K2100 Gastro-esophageal reflux disease with esophagitis, without bleeding: Secondary | ICD-10-CM

## 2018-03-08 DIAGNOSIS — F411 Generalized anxiety disorder: Secondary | ICD-10-CM

## 2018-03-08 DIAGNOSIS — I10 Essential (primary) hypertension: Secondary | ICD-10-CM

## 2018-03-08 DIAGNOSIS — J439 Emphysema, unspecified: Secondary | ICD-10-CM

## 2018-03-08 DIAGNOSIS — E1165 Type 2 diabetes mellitus with hyperglycemia: Secondary | ICD-10-CM

## 2018-03-08 LAB — POCT GLYCOSYLATED HEMOGLOBIN (HGB A1C): HEMOGLOBIN A1C: 7.1

## 2018-03-08 MED ORDER — ALBUTEROL SULFATE HFA 108 (90 BASE) MCG/ACT IN AERS: 2.0000 | INHALATION_SPRAY | Freq: Four times a day (QID) | RESPIRATORY_TRACT | 0 refills | Status: DC | PRN

## 2018-03-08 MED ORDER — PHENTERMINE HCL 37.5 MG PO TABS: 37.5000 mg | ORAL_TABLET | Freq: Every day | ORAL | 0 refills | Status: DC

## 2018-03-08 NOTE — Patient Instructions (Addendum)
Novo nordisk assistance plan.   Coping with Quitting Smoking Quitting smoking is a physical and mental challenge. You will face cravings, withdrawal symptoms, and temptation. Before quitting, work with your health care provider to make a plan that can help you cope. Preparation can help you quit and keep you from giving in. How can I cope with cravings? Cravings usually last for 5-10 minutes. If you get through it, the craving will pass. Consider taking the following actions to help you cope with cravings:  Keep your mouth busy: ? Chew sugar-free gum. ? Suck on hard candies or a straw. ? Brush your teeth.  Keep your hands and body busy: ? Immediately change to a different activity when you feel a craving. ? Squeeze or play with a ball. ? Do an activity or a hobby, like making bead jewelry, practicing needlepoint, or working with wood. ? Mix up your normal routine. ? Take a short exercise break. Go for a quick walk or run up and down stairs. ? Spend time in public places where smoking is not allowed.  Focus on doing something kind or helpful for someone else.  Call a friend or family member to talk during a craving.  Join a support group.  Call a quit line, such as 1-800-QUIT-NOW.  Talk with your health care provider about medicines that might help you cope with cravings and make quitting easier for you.  How can I deal with withdrawal symptoms? Your body may experience negative effects as it tries to get used to not having nicotine in the system. These effects are called withdrawal symptoms. They may include:  Feeling hungrier than normal.  Trouble concentrating.  Irritability.  Trouble sleeping.  Feeling depressed.  Restlessness and agitation.  Craving a cigarette.  To manage withdrawal symptoms:  Avoid places, people, and activities that trigger your cravings.  Remember why you want to quit.  Get plenty of sleep.  Avoid coffee and other caffeinated drinks.  These may worsen some of your symptoms.  How can I handle social situations? Social situations can be difficult when you are quitting smoking, especially in the first few weeks. To manage this, you can:  Avoid parties, bars, and other social situations where people might be smoking.  Avoid alcohol.  Leave right away if you have the urge to smoke.  Explain to your family and friends that you are quitting smoking. Ask for understanding and support.  Plan activities with friends or family where smoking is not an option.  What are some ways I can cope with stress? Wanting to smoke may cause stress, and stress can make you want to smoke. Find ways to manage your stress. Relaxation techniques can help. For example:  Breathe slowly and deeply, in through your nose and out through your mouth.  Listen to soothing, relaxing music.  Talk with a family member or friend about your stress.  Light a candle.  Soak in a bath or take a shower.  Think about a peaceful place.  What are some ways I can prevent weight gain? Be aware that many people gain weight after they quit smoking. However, not everyone does. To keep from gaining weight, have a plan in place before you quit and stick to the plan after you quit. Your plan should include:  Having healthy snacks. When you have a craving, it may help to: ? Eat plain popcorn, crunchy carrots, celery, or other cut vegetables. ? Chew sugar-free gum.  Changing how you eat: ? Eat  small portion sizes at meals. ? Eat 4-6 small meals throughout the day instead of 1-2 large meals a day. ? Be mindful when you eat. Do not watch television or do other things that might distract you as you eat.  Exercising regularly: ? Make time to exercise each day. If you do not have time for a long workout, do short bouts of exercise for 5-10 minutes several times a day. ? Do some form of strengthening exercise, like weight lifting, and some form of aerobic exercise,  like running or swimming.  Drinking plenty of water or other low-calorie or no-calorie drinks. Drink 6-8 glasses of water daily, or as much as instructed by your health care provider.  Summary  Quitting smoking is a physical and mental challenge. You will face cravings, withdrawal symptoms, and temptation to smoke again. Preparation can help you as you go through these challenges.  You can cope with cravings by keeping your mouth busy (such as by chewing gum), keeping your body and hands busy, and making calls to family, friends, or a helpline for people who want to quit smoking.  You can cope with withdrawal symptoms by avoiding places where people smoke, avoiding drinks with caffeine, and getting plenty of rest.  Ask your health care provider about the different ways to prevent weight gain, avoid stress, and handle social situations. This information is not intended to replace advice given to you by your health care provider. Make sure you discuss any questions you have with your health care provider. Document Released: 12/12/2016 Document Revised: 12/12/2016 Document Reviewed: 12/12/2016 Elsevier Interactive Patient Education  [employer].

## 2018-03-08 NOTE — Progress Notes (Signed)
Subjective:     Patient ID: Kathleen Le, female   DOB: 1961/08/14, 57 y.o.   MRN: [id number]  HPI   Kathleen Le is a 57 year old female with PMH of Asthma, HTN, DM presents to the office to recheck her blood sugar levels and to discuss about changes in her health coverage which may affects some of her diabetes medications.   Patient states that she quit smoking two weeks ago. She states that it was hard to overcome craving symptoms but denies using any smoking cessation interventions/meds.Patient stats feeling less wheezy/SOB, significant reduction in use of rescue inhaler ( 2 times in last 2 weeks vs. 3-4 times per day).  She also feels that her energy level and exercise tolerance is improving significantly.   Patient admits consuming diet with lots of sweets and bread and struggle to get away from it. Patient started walkling on treadmill 30 minutes two times a day for last 3 weeks. Overall, patient feels big difference in her energy level after stop smoking and seems motivated to maintain lifestyle changes.   Patient reports occasional night sweats but relates those to her monopause state but denies fever or chills. Patient still feels SOB/wheezing on exertion but it is much better that it has been in the past, no chest pain or palpitation, no increase in sputum production, no hemoptysis. Patient reports sleeping better. Patient feels occasional cramping at lower abdomen which relieve after BM. Denies nausea, vomiting, constipation, diarrhea, or irregular BM. Patient reports chronic numbnes and tingling but denies weakness.   Patient is concern that cost of some of her diabetes medications including tresiba and victoza will not covered by her new insurance plan and need to change to other agents.She is also concern that albuterol inhaler may not cover as well and need med refill before current plan expires in couple of months.   .. Active Ambulatory Problems    Diagnosis Date Noted  . Type II  diabetes mellitus, uncontrolled ([place]) 02/03/2014  . Unspecified asthma(493.90) 02/03/2014  . Essential hypertension, benign 02/03/2014  . Neuropathy 02/05/2014  . IT band syndrome 02/05/2014  . Sinus tachycardia 02/05/2014  . Memory changes 02/05/2014  . Word finding difficulty 02/05/2014  . Cervical cancer ([place]) 02/05/2014  . Depression 05/19/2014  . Anxiety 05/19/2014  . Low back pain with radiation 05/19/2014  . DDD (degenerative disc disease), lumbar 05/19/2014  . PTSD (post-traumatic stress disorder) 09/26/2014  . Foot callus 10/03/2014  . Gastroesophageal reflux disease with esophagitis 01/10/2015  . Cerumen impaction 01/24/2015  . Hyperlipidemia 01/24/2015  . Pulmonary emphysema ([place]) 07/26/2012  . Morbid obesity ([place]) 08/31/2017  . Insomnia due to other mental disorder 10/27/2017  . History of substance abuse 10/27/2017  . GAD (generalized anxiety disorder) 10/27/2017  . Essential hypertension 11/10/2017  . Lower leg edema 11/10/2017  . Former smoker 03/09/2018   Resolved Ambulatory Problems    Diagnosis Date Noted  . Bruised rib 09/26/2014  . Bruised ribs 09/26/2014  . Wound of right leg 09/26/2014   Past Medical History:  Diagnosis Date  . Asthma   . Diabetes mellitus without complication ([place])   . History of alcohol abuse   . History of marijuana use   . Hypertension   . Substance abuse ([place])     Review of Systems   As stated in HPI.     Objective:   Physical Exam  Constitutional:  Morbidly obese female in NAD. Pleasant and cooperative during examination.  HENT:  Head: Normocephalic and  atraumatic.  Nose: Nose normal.  Eyes: Conjunctivae are normal. Pupils are equal, round, and reactive to light. Right eye exhibits no discharge. Left eye exhibits no discharge. No scleral icterus.  Neck: Normal range of motion. Neck supple. No tracheal deviation present. No thyromegaly present.  Cardiovascular: Normal rate, regular rhythm, normal heart sounds and  intact distal pulses. Exam reveals no gallop and no friction rub.  No murmur heard. Pulmonary/Chest: Effort normal and breath sounds normal. No respiratory distress. She has no wheezes. She has no rales.  Musculoskeletal: Normal range of motion. She exhibits no edema, tenderness or deformity.  Posterior tibialis and dorsalis pedis pulse +2, equal bilaterally. No corn, calices, skin intact. No sensation of monofilament at 3 out of 6 points during foot exam.  Lymphadenopathy:    She has no cervical adenopathy.            Assessment:     Kathleen Le was seen today for diabetes.  Diagnoses and all orders for this visit:  Uncontrolled type 2 diabetes mellitus with hyperglycemia ([place]) -     POCT HgB A1C  Morbid obesity ([facility]) -     phentermine (ADIPEX-P) 37.5 MG tablet; Take 1 tablet (37.5 mg total) by mouth daily before breakfast.  Essential hypertension, benign  Pulmonary emphysema, unspecified emphysema type ([facility]) -     albuterol (PROAIR HFA) 108 (90 Base) MCG/ACT inhaler; Inhale 2 puffs into the lungs every 6 (six) hours as needed for wheezing or shortness of breath.  Gastroesophageal reflux disease with esophagitis  GAD (generalized anxiety disorder)  Former smoker  Other orders -     Discontinue: albuterol (PROAIR HFA) 108 (90 Base) MCG/ACT inhaler; Inhale 2 puffs into the lungs every 6 (six) hours as needed for wheezing or shortness of breath.      Plan:     Option for other blood sugar control medicines are discussed. Patient was also encourage to explore patient assistant programs through drug manufacturer web site.  Patient was educated on how to overcome craving while trying to quit smoking and offered additional help if needed.   Patient's blood pressure was elevated during office visit. Rechecked at the end of visit (140/72). Patient did not take her blood pressure medication this morning. Will follow up if remain high. No intervention needed at this time.    Benefits and goal to keep A1C<7 was discussed with patient. Patient was encouraged to continue lifestyle modifications. May not need insulin if A1C<7. Benefits of weight reduction was discussed in detailed. Based on her insurance coverage, patient may not qualify for many pharmacological agents for weight loss. However, option of phentermine was discussed along with potential side effects and risk of regaining weight. Surgical option for weight loss was also explored. Patient was continued to use smart phone apps to achieve weight loss goal. Encourage to reduce carb and refined sugar in her diet.  New prescription for Albuterol inhaler was sent to the pharmacy. Great job on smoking cessation.  Patient to follow up in 3 month.   Kathleen Kitchen.Spent 30 minutes with patient and greater than 50 percent of visit spent counseling patient regarding treatment plan.

## 2018-03-09 ENCOUNTER — Encounter: Payer: Self-pay | Admitting: Physician Assistant

## 2018-03-09 DIAGNOSIS — Z87891 Personal history of nicotine dependence: Secondary | ICD-10-CM | POA: Insufficient documentation

## 2018-03-11 ENCOUNTER — Telehealth: Payer: Self-pay

## 2018-03-11 NOTE — Telephone Encounter (Signed)
OK to decrease to 12 units and reassure her that her body will adjust to the new numbers.

## 2018-03-11 NOTE — Telephone Encounter (Addendum)
Kathleen Le reports her fasting blood sugars - 71, 89, 71, 92, 97, 98. I advised the blood sugars are within normal limits. She states [name] knows she can't handle her blood sugar less than 100 mg/dl. She is taking 14 units daily of [place]. She has changed her diet and is working on losing weight. Please advise.

## 2018-03-12 NOTE — Telephone Encounter (Signed)
Pt advised of recommendation. 

## 2018-03-15 ENCOUNTER — Other Ambulatory Visit: Payer: Self-pay | Admitting: Physician Assistant

## 2018-03-15 DIAGNOSIS — Z794 Long term (current) use of insulin: Principal | ICD-10-CM

## 2018-03-15 DIAGNOSIS — E119 Type 2 diabetes mellitus without complications: Secondary | ICD-10-CM

## 2018-03-15 MED ORDER — PIOGLITAZONE HCL 30 MG PO TABS: 30.0000 mg | ORAL_TABLET | Freq: Every day | ORAL | 3 refills | Status: DC

## 2018-03-15 NOTE — Progress Notes (Signed)
Printed for prescription coverage.

## 2018-04-08 ENCOUNTER — Ambulatory Visit (INDEPENDENT_AMBULATORY_CARE_PROVIDER_SITE_OTHER): Payer: Self-pay | Admitting: Physician Assistant

## 2018-04-08 VITALS — BP 128/66 | HR 88 | Wt 252.0 lb

## 2018-04-08 DIAGNOSIS — R635 Abnormal weight gain: Secondary | ICD-10-CM

## 2018-04-08 MED ORDER — PHENTERMINE HCL 37.5 MG PO TABS: 37.5000 mg | ORAL_TABLET | Freq: Every day | ORAL | 0 refills | Status: DC

## 2018-04-08 NOTE — Progress Notes (Signed)
   Subjective:    Patient ID: Kathleen Le, female    DOB: 1961-08-15, 57 y.o.   MRN: [id number]  HPI  Kathleen Le is here for blood pressure and weight check. Diet and exercise is going well. Denies trouble sleeping or palpitations.   Review of Systems     Objective:   Physical Exam        Assessment & Plan:  Abnormal weight gain - Patient has lost weight. A refill on phentermine sent to pharmacy. Patient advised to schedule a follow up in 4 weeks.   Agree with above plan. Iran Planas PA-C

## 2018-04-29 ENCOUNTER — Encounter: Payer: Self-pay | Admitting: Physician Assistant

## 2018-05-10 ENCOUNTER — Ambulatory Visit (INDEPENDENT_AMBULATORY_CARE_PROVIDER_SITE_OTHER): Payer: Self-pay | Admitting: Physician Assistant

## 2018-05-10 DIAGNOSIS — E119 Type 2 diabetes mellitus without complications: Secondary | ICD-10-CM

## 2018-05-10 DIAGNOSIS — R635 Abnormal weight gain: Secondary | ICD-10-CM

## 2018-05-10 DIAGNOSIS — Z794 Long term (current) use of insulin: Secondary | ICD-10-CM

## 2018-05-10 MED ORDER — INSULIN DEGLUDEC 100 UNIT/ML ~~LOC~~ SOPN: 10.0000 [IU] | PEN_INJECTOR | Freq: Every day | SUBCUTANEOUS | 5 refills | Status: DC

## 2018-05-10 MED ORDER — LIRAGLUTIDE 18 MG/3ML ~~LOC~~ SOPN: 1.2000 mg | PEN_INJECTOR | Freq: Every day | SUBCUTANEOUS | 0 refills | Status: DC

## 2018-05-10 MED ORDER — PHENTERMINE HCL 37.5 MG PO TABS: 37.5000 mg | ORAL_TABLET | Freq: Every day | ORAL | 0 refills | Status: DC

## 2018-05-10 NOTE — Progress Notes (Signed)
Patient is here for blood pressure and weight check. Patient reports she is trying to lose weight to get off her insulin. Denies any trouble sleeping, palpitations, or any other medication problems. Patient has not lost weight but remained the same. A refill for Phentermine will be sent to Provider for reveiw.   Pt questions of more Victoza needs to be sent in. She has gone from 1.8mg  down to 1.2mg , but doesn't have enough to last her through the month. Pt also states she has gone down on her insulin from 20 units to 10 units. She does have enough insulin to last through the month. Patient advised I would route this to PCP and call her with further instruction. Verbalized understanding, no further questions.  Will given one more month to lose weight on phentermine.  Refilled victoza and tresbia.  Follow up in 1 month.  Iran Planas PA-C

## 2018-05-13 ENCOUNTER — Other Ambulatory Visit: Payer: Self-pay | Admitting: Physician Assistant

## 2018-05-13 DIAGNOSIS — F5105 Insomnia due to other mental disorder: Secondary | ICD-10-CM

## 2018-05-13 DIAGNOSIS — F99 Mental disorder, not otherwise specified: Principal | ICD-10-CM

## 2018-05-25 ENCOUNTER — Other Ambulatory Visit: Payer: Self-pay | Admitting: Physician Assistant

## 2018-05-25 DIAGNOSIS — Z794 Long term (current) use of insulin: Principal | ICD-10-CM

## 2018-05-25 DIAGNOSIS — E119 Type 2 diabetes mellitus without complications: Secondary | ICD-10-CM

## 2018-05-25 DIAGNOSIS — I10 Essential (primary) hypertension: Secondary | ICD-10-CM

## 2018-06-02 ENCOUNTER — Other Ambulatory Visit: Payer: Self-pay | Admitting: *Deleted

## 2018-06-02 DIAGNOSIS — I10 Essential (primary) hypertension: Secondary | ICD-10-CM

## 2018-06-02 MED ORDER — AMLODIPINE BESYLATE 5 MG PO TABS: 5.0000 mg | ORAL_TABLET | Freq: Every day | ORAL | 1 refills | Status: DC

## 2018-06-08 ENCOUNTER — Ambulatory Visit (INDEPENDENT_AMBULATORY_CARE_PROVIDER_SITE_OTHER): Payer: Medicaid Other | Admitting: Physician Assistant

## 2018-06-08 ENCOUNTER — Encounter: Payer: Self-pay | Admitting: Physician Assistant

## 2018-06-08 VITALS — BP 130/74 | HR 93 | Ht 65.0 in | Wt 251.0 lb

## 2018-06-08 DIAGNOSIS — F5101 Primary insomnia: Secondary | ICD-10-CM

## 2018-06-08 DIAGNOSIS — R232 Flushing: Secondary | ICD-10-CM | POA: Insufficient documentation

## 2018-06-08 DIAGNOSIS — F40240 Claustrophobia: Secondary | ICD-10-CM

## 2018-06-08 DIAGNOSIS — E119 Type 2 diabetes mellitus without complications: Secondary | ICD-10-CM

## 2018-06-08 DIAGNOSIS — Z794 Long term (current) use of insulin: Secondary | ICD-10-CM

## 2018-06-08 DIAGNOSIS — R635 Abnormal weight gain: Secondary | ICD-10-CM | POA: Insufficient documentation

## 2018-06-08 DIAGNOSIS — I10 Essential (primary) hypertension: Secondary | ICD-10-CM

## 2018-06-08 DIAGNOSIS — F17200 Nicotine dependence, unspecified, uncomplicated: Secondary | ICD-10-CM | POA: Insufficient documentation

## 2018-06-08 HISTORY — DX: Abnormal weight gain: R63.5

## 2018-06-08 HISTORY — DX: Flushing: R23.2

## 2018-06-08 HISTORY — DX: Claustrophobia: F40.240

## 2018-06-08 HISTORY — DX: Nicotine dependence, unspecified, uncomplicated: F17.200

## 2018-06-08 HISTORY — DX: Type 2 diabetes mellitus without complications: E11.9

## 2018-06-08 HISTORY — DX: Primary insomnia: F51.01

## 2018-06-08 LAB — POCT GLYCOSYLATED HEMOGLOBIN (HGB A1C): Hemoglobin A1C: 6.4 % — AB (ref 4.0–5.6)

## 2018-06-08 MED ORDER — PHENTERMINE HCL 37.5 MG PO TABS: 37.5000 mg | ORAL_TABLET | Freq: Every day | ORAL | 0 refills | Status: DC

## 2018-06-08 MED ORDER — GLIPIZIDE ER 10 MG PO TB24: 10.0000 mg | ORAL_TABLET | Freq: Every day | ORAL | 3 refills | Status: DC

## 2018-06-08 MED ORDER — PIOGLITAZONE HCL 30 MG PO TABS: 30.0000 mg | ORAL_TABLET | Freq: Every day | ORAL | 3 refills | Status: DC

## 2018-06-08 MED ORDER — BUPROPION HCL ER (SR) 150 MG PO TB12: 150.0000 mg | ORAL_TABLET | Freq: Two times a day (BID) | ORAL | 2 refills | Status: DC

## 2018-06-08 MED ORDER — ATENOLOL 50 MG PO TABS: 50.0000 mg | ORAL_TABLET | Freq: Every day | ORAL | 3 refills | Status: DC

## 2018-06-08 MED ORDER — SIMVASTATIN 20 MG PO TABS: 20.0000 mg | ORAL_TABLET | Freq: Every day | ORAL | 3 refills | Status: DC

## 2018-06-08 MED ORDER — AMLODIPINE BESYLATE 5 MG PO TABS: 5.0000 mg | ORAL_TABLET | Freq: Every day | ORAL | 1 refills | Status: DC

## 2018-06-08 MED ORDER — HYDROCHLOROTHIAZIDE 25 MG PO TABS: ORAL_TABLET | ORAL | 3 refills | Status: DC

## 2018-06-08 MED ORDER — LISINOPRIL 5 MG PO TABS: 5.0000 mg | ORAL_TABLET | Freq: Every day | ORAL | 3 refills | Status: DC

## 2018-06-08 MED ORDER — METFORMIN HCL 1000 MG PO TABS: 1000.0000 mg | ORAL_TABLET | Freq: Two times a day (BID) | ORAL | 3 refills | Status: DC

## 2018-06-08 MED ORDER — SITAGLIPTIN PHOSPHATE 100 MG PO TABS: 100.0000 mg | ORAL_TABLET | Freq: Every day | ORAL | 1 refills | Status: DC

## 2018-06-08 MED ORDER — DOXEPIN HCL 25 MG PO CAPS: ORAL_CAPSULE | ORAL | 5 refills | Status: DC

## 2018-06-08 NOTE — Progress Notes (Signed)
Subjective:    Patient ID: Kathleen Le, female    DOB: [birth year], 57 y.o.   MRN: [id number]  HPI Patient is a 57 yo female with a pmxh of asthma, HTN, and T2DM presenting to clinic to recheck blood sugar and discuss medications. Her health insurance coverage is changing and she is trying to reduce her need for insulin. A1c today is 6.3 which is down from 7.1 3 months ago. She states she has been exercising more and reduced her intake of sugar and carbs. She has been using phentermine as well for 3 months. It worked really well for 1 month with appetite reduction but she believes it is no longer working. She had an exam last month and foot exam 3 months ago (March). She still experiences symptoms of neuropathy which she has tried neurontin for in the past. She is not using anything for it currently but states the symptoms are manageable. She is taking her Glipizide as prescribed. She is taking a half Januvia in am and half in pm as prescribed. She has reduced her Tresiba dose - she started with 20 units at night and has reduced this to 8 units. She has reduced her Victoza dose as well, from 1.8 to 1.12 mgs.  She states she is still having difficulty sleeping. Taking doxepin nightly and falling asleep without difficulty but waking up during the night hot and sweaty. She thinks this may be related to perimenopause symptoms and having her dogs in the bed with her which also make her hot. The doxepin has eliminated panic and claustrophobia during the night.  She had some success with smoking cessation, only smoking on the weekends. She experienced improvement in [facility] and wheezing with this reduction and needed her inhaler less. Due to life stress she is back up to a half pack per day and is needing to use her inhaler daily. She has used Nicoderm in the past but was advised by a previous provider to discontinue used due to some symptoms in her legs.   Review of Systems  All other systems reviewed and are  negative.      Objective:   Physical Exam  Constitutional: She is oriented to person, place, and time. She appears well-developed and well-nourished. No distress.  HENT:  Head: Normocephalic and atraumatic.  Cardiovascular: Normal rate, regular rhythm and normal heart sounds.  Pulmonary/Chest: Effort normal and breath sounds normal. No stridor. No respiratory distress. She has no wheezes.  Neurological: She is alert and oriented to person, place, and time.  Skin: She is not diaphoretic.  Psychiatric: She has a normal mood and affect.    .. Past Medical History:  Diagnosis Date  . Asthma   . Diabetes mellitus without complication ([place])   . History of alcohol abuse   . History of marijuana use   . Hypertension   . Substance abuse ([place])    .[name] Family History  Problem Relation Age of Onset  . Cancer Paternal Grandmother   . Diabetes Paternal Grandmother   . Diabetes Paternal Grandfather   . Heart attack Maternal Aunt   . Cancer Maternal Uncle   . Cancer Maternal Grandmother   . Heart attack Mother   . Cancer Mother   . Diabetes Father   . Heart attack Father   . Diabetes Paternal Uncle   . Diabetes Paternal Uncle          Assessment & Plan:  .Kathleen Le was seen today for diabetes and hypertension.  Diagnoses  and all orders for this visit:  Diabetes mellitus, type II, insulin dependent (HCC) -     POCT HgB A1C -     glipiZIDE (GLIPIZIDE XL) 10 MG 24 hr tablet; Take 1 tablet (10 mg total) by mouth daily with breakfast. -     lisinopril (PRINIVIL,ZESTRIL) 5 MG tablet; Take 1 tablet (5 mg total) by mouth daily. -     metFORMIN (GLUCOPHAGE) 1000 MG tablet; Take 1 tablet (1,000 mg total) by mouth 2 (two) times daily with a meal. -     pioglitazone (ACTOS) 30 MG tablet; Take 1 tablet (30 mg total) by mouth daily. -     simvastatin (ZOCOR) 20 MG tablet; Take 1 tablet (20 mg total) by mouth daily. -     sitaGLIPtin (JANUVIA) 100 MG tablet; Take 1 tablet (100 mg total) by mouth  daily.  Morbid obesity (HCC) -     phentermine (ADIPEX-P) 37.5 MG tablet; Take 1 tablet (37.5 mg total) by mouth daily before breakfast.  Abnormal weight gain -     phentermine (ADIPEX-P) 37.5 MG tablet; Take 1 tablet (37.5 mg total) by mouth daily before breakfast.  Essential hypertension, benign -     amLODipine (NORVASC) 5 MG tablet; Take 1 tablet (5 mg total) by mouth daily. -     hydrochlorothiazide (HYDRODIURIL) 25 MG tablet; TAKE 1 TABLET BY MOUTH ONCE DAILY -     atenolol (TENORMIN) 50 MG tablet; Take 1 tablet (50 mg total) by mouth daily.  Current smoker -     buPROPion (WELLBUTRIN SR) 150 MG 12 hr tablet; Take 1 tablet (150 mg total) by mouth 2 (two) times daily.  Claustrophobia -     doxepin (SINEQUAN) 25 MG capsule; Take 1 to 2 tablet at bedtime as needed for sleep.  Primary insomnia -     doxepin (SINEQUAN) 25 MG capsule; Take 1 to 2 tablet at bedtime as needed for sleep.   .. Results for orders placed or performed in visit on 06/08/18  POCT HgB A1C  Result Value Ref Range   Hemoglobin A1C 6.4 (A) 4.0 - 5.6 %   HbA1c, POC (prediabetic range)  5.7 - 6.4 %   HbA1c, POC (controlled diabetic range)  0.0 - 7.0 %   Sugars are actually doing really well. I would like for her to stay on all orals and victoza. She can start to taper off Tresbia.  On STATIN.  On ACE. BP controlled.  Eye exam done but need to get copy scanned in.  Discussed diet and exercise.  Continue phentermine 1/2 tablet for next 2 months.  Follow up in 3 months.   For sleep continue doxepin since helping with clautraphobia. She may start OTC estroven for hot flashes. Likely menopausal related. She is not a candidate for HRT currently due to DM, HTN, smoker and age.   Discussed smoking cessation. Started wellbutrin. Perhaps could help with mood as well. Cut back weekly. Follow up in 3 months. Pt is very motivated to quit.   [name].Spent 30 minutes with patient and greater than 50 percent of visit spent  counseling patient regarding treatment plan.

## 2018-06-08 NOTE — Progress Notes (Deleted)
Less than pack

## 2018-06-16 ENCOUNTER — Telehealth: Payer: Self-pay | Admitting: Physician Assistant

## 2018-06-16 NOTE — Telephone Encounter (Signed)
She can obviously it is not the best for her sugars but if only a week or so should be fine.

## 2018-06-16 NOTE — Telephone Encounter (Signed)
Patient calls this morning and is waiting on getting the Victoza through the mail program. Asked if we had any samples.  We do not have any samples and advised the patient of this and she wanted to let you know she will stop the Victoza when she runs out of the pen she has until she receives Victoza in the mail. KG LPN

## 2018-08-04 ENCOUNTER — Other Ambulatory Visit: Payer: Self-pay | Admitting: Physician Assistant

## 2018-08-04 DIAGNOSIS — R635 Abnormal weight gain: Secondary | ICD-10-CM

## 2018-08-05 NOTE — Telephone Encounter (Signed)
Needs appointment for refill controlled substance for weight loss

## 2018-08-22 ENCOUNTER — Other Ambulatory Visit: Payer: Self-pay | Admitting: Physician Assistant

## 2018-08-22 DIAGNOSIS — E119 Type 2 diabetes mellitus without complications: Secondary | ICD-10-CM

## 2018-08-22 DIAGNOSIS — Z794 Long term (current) use of insulin: Principal | ICD-10-CM

## 2018-08-31 ENCOUNTER — Other Ambulatory Visit: Payer: Self-pay | Admitting: Physician Assistant

## 2018-08-31 DIAGNOSIS — J439 Emphysema, unspecified: Secondary | ICD-10-CM

## 2018-09-08 ENCOUNTER — Ambulatory Visit (INDEPENDENT_AMBULATORY_CARE_PROVIDER_SITE_OTHER): Payer: Self-pay | Admitting: Physician Assistant

## 2018-09-08 ENCOUNTER — Encounter: Payer: Self-pay | Admitting: Physician Assistant

## 2018-09-08 VITALS — BP 138/61 | HR 87 | Ht 65.0 in | Wt 257.0 lb

## 2018-09-08 DIAGNOSIS — F430 Acute stress reaction: Secondary | ICD-10-CM

## 2018-09-08 DIAGNOSIS — E1165 Type 2 diabetes mellitus with hyperglycemia: Secondary | ICD-10-CM

## 2018-09-08 DIAGNOSIS — Z79899 Other long term (current) drug therapy: Secondary | ICD-10-CM

## 2018-09-08 LAB — COMPLETE METABOLIC PANEL WITH GFR
AG Ratio: 1.5 (calc) (ref 1.0–2.5)
ALT: 11 U/L (ref 6–29)
AST: 12 U/L (ref 10–35)
Albumin: 3.9 g/dL (ref 3.6–5.1)
Alkaline phosphatase (APISO): 77 U/L (ref 33–130)
BUN: 22 mg/dL (ref 7–25)
CO2: 32 mmol/L (ref 20–32)
CREATININE: 0.85 mg/dL (ref 0.50–1.05)
Calcium: 9.5 mg/dL (ref 8.6–10.4)
Chloride: 101 mmol/L (ref 98–110)
GFR, EST AFRICAN AMERICAN: 89 mL/min/{1.73_m2} (ref >=60)
GFR, Est Non African American: 77 mL/min/{1.73_m2} (ref >=60)
Globulin: 2.6 g/dL (calc) (ref 1.9–3.7)
Glucose, Bld: 108 mg/dL — ABNORMAL HIGH (ref 65–99)
Potassium: 4.3 mmol/L (ref 3.5–5.3)
Sodium: 140 mmol/L (ref 135–146)
TOTAL PROTEIN: 6.5 g/dL (ref 6.1–8.1)
Total Bilirubin: 0.2 mg/dL (ref 0.2–1.2)

## 2018-09-08 LAB — POCT GLYCOSYLATED HEMOGLOBIN (HGB A1C): Hemoglobin A1C: 6.6 % — AB (ref 4.0–5.6)

## 2018-09-08 NOTE — Progress Notes (Signed)
Subjective:    Patient ID: Kathleen Le, female    DOB: 31-May-1961, 57 y.o.   MRN: [id number]  HPI  Pt is a 57 yo obese female with T2DM, HTN, Emphysema, HLD who presents to the clinic for 3 month follow up.   Pt admits she has had some medications non-complaince due to cost. She thinks she has it worked out now. She did gain some weight. Her mother had a stroke and she is spending a lot of time looking after her. She admits to not exercising. She is not checking her blood sugars. She denies any hypoglycemic events or open sores or wounds.   Denies any CP, palpitations, worsening SOB, cough.   .. Active Ambulatory Problems    Diagnosis Date Noted  . Type II diabetes mellitus, uncontrolled ([place]) 02/03/2014  . Unspecified asthma(493.90) 02/03/2014  . Essential hypertension, benign 02/03/2014  . Neuropathy 02/05/2014  . IT band syndrome 02/05/2014  . Sinus tachycardia 02/05/2014  . Memory changes 02/05/2014  . Word finding difficulty 02/05/2014  . Cervical cancer ([place]) 02/05/2014  . Depression 05/19/2014  . Anxiety 05/19/2014  . Low back pain with radiation 05/19/2014  . DDD (degenerative disc disease), lumbar 05/19/2014  . PTSD (post-traumatic stress disorder) 09/26/2014  . Foot callus 10/03/2014  . Gastroesophageal reflux disease with esophagitis 01/10/2015  . Cerumen impaction 01/24/2015  . Hyperlipidemia 01/24/2015  . Pulmonary emphysema ([place]) 07/26/2012  . Morbid obesity ([place]) 08/31/2017  . Insomnia due to other mental disorder 10/27/2017  . History of substance abuse 10/27/2017  . GAD (generalized anxiety disorder) 10/27/2017  . Essential hypertension 11/10/2017  . Lower leg edema 11/10/2017  . Current smoker 06/08/2018  . Diabetes mellitus, type II, insulin dependent ([place]) 06/08/2018  . Claustrophobia 06/08/2018  . Primary insomnia 06/08/2018  . Hot flashes 06/08/2018  . Abnormal weight gain 06/08/2018  . Acute stress reaction 09/12/2018   Resolved Ambulatory  Problems    Diagnosis Date Noted  . Bruised rib 09/26/2014  . Bruised ribs 09/26/2014  . Wound of right leg 09/26/2014  . Former smoker 03/09/2018   Past Medical History:  Diagnosis Date  . Asthma   . Diabetes mellitus without complication ([place])   . History of alcohol abuse   . History of marijuana use   . Hypertension   . Substance abuse ([place])      Review of Systems  All other systems reviewed and are negative.      Objective:   Physical Exam  Constitutional: She is oriented to person, place, and time. She appears well-developed and well-nourished.  Obese.   HENT:  Head: Normocephalic and atraumatic.  Cardiovascular: Normal rate and regular rhythm.  Pulmonary/Chest: Effort normal and breath sounds normal.  Neurological: She is alert and oriented to person, place, and time.          Assessment & Plan:  [name] KitchenDiagnoses and all orders for this visit:  Uncontrolled type 2 diabetes mellitus with hyperglycemia (HCC) -     POCT glycosylated hemoglobin (Hb A1C)  Acute stress reaction  Medication management -     COMPLETE METABOLIC PANEL WITH GFR   .[name] Results for orders placed or performed in visit on 09/08/18  COMPLETE METABOLIC PANEL WITH GFR  Result Value Ref Range   Glucose, Bld 108 (H) 65 - 99 mg/dL   BUN 22 7 - 25 mg/dL   Creat 0.85 0.50 - 1.05 mg/dL   GFR, Est Non African American 77 > OR = 60 mL/min/1.81m2   GFR,  Est African American 89 > OR = 60 mL/min/1.14m2   BUN/Creatinine Ratio NOT APPLICABLE 6 - 22 (calc)   Sodium 140 135 - 146 mmol/L   Potassium 4.3 3.5 - 5.3 mmol/L   Chloride 101 98 - 110 mmol/L   CO2 32 20 - 32 mmol/L   Calcium 9.5 8.6 - 10.4 mg/dL   Total Protein 6.5 6.1 - 8.1 g/dL   Albumin 3.9 3.6 - 5.1 g/dL   Globulin 2.6 1.9 - 3.7 g/dL (calc)   AG Ratio 1.5 1.0 - 2.5 (calc)   Total Bilirubin 0.2 0.2 - 1.2 mg/dL   Alkaline phosphatase (APISO) 77 33 - 130 U/L   AST 12 10 - 35 U/L   ALT 11 6 - 29 U/L  POCT glycosylated hemoglobin (Hb  A1C)  Result Value Ref Range   Hemoglobin A1C 6.6 (A) 4.0 - 5.6 %   HbA1c POC (<> result, manual entry)     HbA1c, POC (prediabetic range)     HbA1c, POC (controlled diabetic range)     A!C still looks good.  On a STATIN.  On a ACE.  Continue to make healthy lifestyle changes to lose weight and keep sugars down.  CMP ordered today.  Does not need refills.  Pt aware she needs eye esam. She does not have insurance but she states  She will get it done.  Pt declines flu shot.

## 2018-09-09 NOTE — Progress Notes (Signed)
Call pt: kidney, liver looks great.

## 2018-09-12 ENCOUNTER — Encounter: Payer: Self-pay | Admitting: Physician Assistant

## 2018-09-12 DIAGNOSIS — F430 Acute stress reaction: Secondary | ICD-10-CM | POA: Insufficient documentation

## 2018-09-12 HISTORY — DX: Acute stress reaction: F43.0

## 2018-10-15 ENCOUNTER — Telehealth: Payer: Self-pay

## 2018-10-15 MED ORDER — FLUCONAZOLE 150 MG PO TABS: 150.0000 mg | ORAL_TABLET | Freq: Once | ORAL | 0 refills | Status: AC

## 2018-10-15 NOTE — Telephone Encounter (Signed)
Diflucan sent in

## 2018-10-15 NOTE — Telephone Encounter (Signed)
Kathleen Le called today, she is one of [name]'s patients. She was calling because she is coming into the office on Monday for a stitch removal, and apparently [name] put her on antibiotics. The antibiotics typically cause her to get a yeast infection, so she was wondering if there was any medication to help prevent her from having a yeast infection? Thanks so much!

## 2018-10-15 NOTE — Telephone Encounter (Signed)
Patient has been notified. Thanks

## 2018-10-18 ENCOUNTER — Ambulatory Visit (INDEPENDENT_AMBULATORY_CARE_PROVIDER_SITE_OTHER): Payer: Medicaid Other | Admitting: Physician Assistant

## 2018-10-18 ENCOUNTER — Encounter: Payer: Self-pay | Admitting: Physician Assistant

## 2018-10-18 VITALS — BP 130/68 | HR 84 | Ht 65.0 in | Wt 257.0 lb

## 2018-10-18 DIAGNOSIS — Z4802 Encounter for removal of sutures: Secondary | ICD-10-CM

## 2018-10-18 DIAGNOSIS — W540XXD Bitten by dog, subsequent encounter: Secondary | ICD-10-CM

## 2018-10-18 MED ORDER — FLUCONAZOLE 150 MG PO TABS: 150.0000 mg | ORAL_TABLET | Freq: Once | ORAL | 0 refills | Status: AC

## 2018-10-18 NOTE — Progress Notes (Signed)
Subjective:    Patient ID: Kathleen Le, female    DOB: September 28, 1961, 57 y.o.   MRN: [id number]  HPI  Kathleen Le is a 57 y.o. female who obtained a laceration on 10/06/2018 which was 12 days ago, which required closure with 11 total simple interrupted stiches in the ED to the right arm. Mechanism of injury: dog bite. She denies pain, redness from the wound. She does have some tenderness on the wound just above right elbow. Her last tetanus was 02/03/14. She was also treated with antibiotic in ED. She called earlier with symptoms of yeast infection after antibiotic. It improved but still feels like she has some residual symptoms. She request one more pill.   .. Active Ambulatory Problems    Diagnosis Date Noted  . Type II diabetes mellitus, uncontrolled ([place]) 02/03/2014  . Unspecified asthma(493.90) 02/03/2014  . Essential hypertension, benign 02/03/2014  . Neuropathy 02/05/2014  . IT band syndrome 02/05/2014  . Sinus tachycardia 02/05/2014  . Memory changes 02/05/2014  . Word finding difficulty 02/05/2014  . Cervical cancer ([place]) 02/05/2014  . Depression 05/19/2014  . Anxiety 05/19/2014  . Low back pain with radiation 05/19/2014  . DDD (degenerative disc disease), lumbar 05/19/2014  . PTSD (post-traumatic stress disorder) 09/26/2014  . Foot callus 10/03/2014  . Gastroesophageal reflux disease with esophagitis 01/10/2015  . Cerumen impaction 01/24/2015  . Hyperlipidemia 01/24/2015  . Pulmonary emphysema ([place]) 07/26/2012  . Morbid obesity ([place]) 08/31/2017  . Insomnia due to other mental disorder 10/27/2017  . History of substance abuse ([place]) 10/27/2017  . GAD (generalized anxiety disorder) 10/27/2017  . Essential hypertension 11/10/2017  . Lower leg edema 11/10/2017  . Current smoker 06/08/2018  . Diabetes mellitus, type II, insulin dependent ([place]) 06/08/2018  . Claustrophobia 06/08/2018  . Primary insomnia 06/08/2018  . Hot flashes 06/08/2018  . Abnormal weight gain 06/08/2018   . Acute stress reaction 09/12/2018   Resolved Ambulatory Problems    Diagnosis Date Noted  . Bruised rib 09/26/2014  . Bruised ribs 09/26/2014  . Wound of right leg 09/26/2014  . Former smoker 03/09/2018   Past Medical History:  Diagnosis Date  . Asthma   . Diabetes mellitus without complication ([place])   . History of alcohol abuse   . History of marijuana use   . Hypertension   . Substance abuse ([place])       Review of Systems See HPI.     Objective:   Physical Exam  Constitutional: She is oriented to person, place, and time. She appears well-developed and well-nourished.  HENT:  Head: Normocephalic and atraumatic.  Cardiovascular: Normal rate and regular rhythm.  Pulmonary/Chest: Effort normal and breath sounds normal.  Neurological: She is alert and oriented to person, place, and time.  Skin: Skin is warm.  Psychiatric: She has a normal mood and affect. Her behavior is normal.     BP 130/68   Pulse 84   Ht 5\' 5"  (1.651 m)   Wt 257 lb (116.6 kg)   BMI 42.77 kg/m  Injury exam:  4 separate laceration areas with total 11 sutures removed without evidence of infection      Assessment & Plan:  [name] KitchenDiagnoses and all orders for this visit:  Dog bite, subsequent encounter  Other orders -     fluconazole (DIFLUCAN) 150 MG tablet; Take 1 tablet (150 mg total) by mouth once for 1 dose.   Wound appears to be healing well. Discussed wound care. All 11 sutures removed. Covered with coban  and antibiotic ointment for next 2 to 3 days.if wounds start to show signs of infection follow up.  Another tablet of diflucan given for yeast infection about abx use.                  [name]

## 2018-10-18 NOTE — Patient Instructions (Signed)
Suture Removal, Care After Refer to this sheet in the next few weeks. These instructions provide you with information on caring for yourself after your procedure. Your health care provider may also give you more specific instructions. Your treatment has been planned according to current medical practices, but problems sometimes occur. Call your health care provider if you have any problems or questions after your procedure. What can I expect after the procedure? After your stitches (sutures) are removed, it is typical to have the following:  Some discomfort and swelling in the wound area.  Slight redness in the area.  Follow these instructions at home:  If you have skin adhesive strips over the wound area, do not take the strips off. They will fall off on their own in a few days. If the strips remain in place after 14 days, you may remove them.  Change any bandages (dressings) at least once a day or as directed by your health care provider. If the bandage sticks, soak it off with warm, soapy water.  Apply cream or ointment only as directed by your health care provider. If using cream or ointment, wash the area with soap and water 2 times a day to remove all the cream or ointment. Rinse off the soap and pat the area dry with a clean towel.  Keep the wound area dry and clean. If the bandage becomes wet or dirty, or if it develops a bad smell, change it as soon as possible.  Continue to protect the wound from injury.  Use sunscreen when out in the sun. New scars become sunburned easily. Contact a health care provider if:  You have increasing redness, swelling, or pain in the wound.  You see pus coming from the wound.  You have a fever.  You notice a bad smell coming from the wound or dressing.  Your wound breaks open (edges not staying together). This information is not intended to replace advice given to you by your health care provider. Make sure you discuss any questions you have  with your health care provider. Document Released: 09/09/2001 Document Revised: 05/22/2016 Document Reviewed: 07/27/2013 Elsevier Interactive Patient Education  2017 Elsevier Inc.  

## 2018-11-24 ENCOUNTER — Other Ambulatory Visit: Payer: Self-pay | Admitting: Physician Assistant

## 2018-11-24 DIAGNOSIS — E119 Type 2 diabetes mellitus without complications: Secondary | ICD-10-CM

## 2018-11-24 DIAGNOSIS — Z794 Long term (current) use of insulin: Principal | ICD-10-CM

## 2018-11-29 ENCOUNTER — Other Ambulatory Visit: Payer: Self-pay

## 2018-11-29 DIAGNOSIS — E119 Type 2 diabetes mellitus without complications: Secondary | ICD-10-CM

## 2018-11-29 DIAGNOSIS — Z794 Long term (current) use of insulin: Principal | ICD-10-CM

## 2018-11-29 NOTE — Telephone Encounter (Signed)
Kathleen Le called today saying that she needs a refill on her Simvastatin. Just wanted to ask you first to make sure it is okay to refill. Thanks!

## 2018-11-30 MED ORDER — SIMVASTATIN 20 MG PO TABS: 20.0000 mg | ORAL_TABLET | Freq: Every day | ORAL | 3 refills | Status: DC

## 2018-12-08 ENCOUNTER — Ambulatory Visit: Payer: Medicaid Other | Admitting: Physician Assistant

## 2018-12-15 ENCOUNTER — Ambulatory Visit: Payer: Self-pay | Admitting: Physician Assistant

## 2019-01-11 ENCOUNTER — Other Ambulatory Visit: Payer: Self-pay | Admitting: Physician Assistant

## 2019-01-11 DIAGNOSIS — F5101 Primary insomnia: Secondary | ICD-10-CM

## 2019-01-11 DIAGNOSIS — F40240 Claustrophobia: Secondary | ICD-10-CM

## 2019-01-12 ENCOUNTER — Telehealth: Payer: Self-pay

## 2019-01-12 MED ORDER — ALBUTEROL SULFATE HFA 108 (90 BASE) MCG/ACT IN AERS: 2.0000 | INHALATION_SPRAY | Freq: Four times a day (QID) | RESPIRATORY_TRACT | 2 refills | Status: DC | PRN

## 2019-01-12 NOTE — Telephone Encounter (Signed)
Ok to order proair inhaler with 2 refills.

## 2019-01-12 NOTE — Telephone Encounter (Signed)
Inhaler has been ordered. Thanks!

## 2019-01-12 NOTE — Telephone Encounter (Signed)
Kathleen Le called today in need of another refill on her inhaler. Patient states she albuterol inhaler does not seem to work as well as the [employer] inhaler that was called in for her. Patient states she was originally due for refills on the inhaler in March, but her dog got hold of the inhaler, and bit a hole through the device causing the inhaler to become empty. Patient would like a refill if at all possible, and patient states she is going to follow-up with [place] next week at her appointment. Just wanted to make sure it is okay to order. Thanks!

## 2019-01-19 ENCOUNTER — Ambulatory Visit (INDEPENDENT_AMBULATORY_CARE_PROVIDER_SITE_OTHER): Payer: Self-pay | Admitting: Physician Assistant

## 2019-01-19 ENCOUNTER — Encounter: Payer: Self-pay | Admitting: Physician Assistant

## 2019-01-19 VITALS — BP 140/78 | HR 84 | Temp 98.0°F | Ht 65.0 in | Wt 261.0 lb

## 2019-01-19 DIAGNOSIS — Z794 Long term (current) use of insulin: Secondary | ICD-10-CM

## 2019-01-19 DIAGNOSIS — E1165 Type 2 diabetes mellitus with hyperglycemia: Secondary | ICD-10-CM

## 2019-01-19 DIAGNOSIS — J209 Acute bronchitis, unspecified: Secondary | ICD-10-CM

## 2019-01-19 DIAGNOSIS — F17200 Nicotine dependence, unspecified, uncomplicated: Secondary | ICD-10-CM

## 2019-01-19 DIAGNOSIS — F40240 Claustrophobia: Secondary | ICD-10-CM

## 2019-01-19 DIAGNOSIS — E119 Type 2 diabetes mellitus without complications: Secondary | ICD-10-CM

## 2019-01-19 DIAGNOSIS — F5101 Primary insomnia: Secondary | ICD-10-CM

## 2019-01-19 LAB — POCT GLYCOSYLATED HEMOGLOBIN (HGB A1C): Hemoglobin A1C: 7 % — AB (ref 4.0–5.6)

## 2019-01-19 MED ORDER — DOXEPIN HCL 75 MG PO CAPS: 75.0000 mg | ORAL_CAPSULE | Freq: Every day | ORAL | 1 refills | Status: DC

## 2019-01-19 MED ORDER — PREDNISONE 50 MG PO TABS: ORAL_TABLET | ORAL | 0 refills | Status: DC

## 2019-01-19 MED ORDER — GLIPIZIDE ER 10 MG PO TB24: ORAL_TABLET | ORAL | 1 refills | Status: DC

## 2019-01-19 MED ORDER — BUPROPION HCL ER (SR) 150 MG PO TB12: 150.0000 mg | ORAL_TABLET | Freq: Two times a day (BID) | ORAL | 1 refills | Status: DC

## 2019-01-19 MED ORDER — LIRAGLUTIDE 18 MG/3ML ~~LOC~~ SOPN: 1.2000 mg | PEN_INJECTOR | Freq: Every day | SUBCUTANEOUS | 1 refills | Status: DC

## 2019-01-19 MED ORDER — SITAGLIPTIN PHOSPHATE 100 MG PO TABS: 100.0000 mg | ORAL_TABLET | Freq: Every day | ORAL | 1 refills | Status: DC

## 2019-01-19 NOTE — Progress Notes (Signed)
Subjective:    Patient ID: Kathleen Le, female    DOB: 03/24/1961, 58 y.o.   MRN: [id number]  HPI Pt is a 58 yo female with T2DM, HTN, insomnia who presents to the clinic for 3 month follow up.   DM- not checking sugars. She is taking her medications daily. She admits that her diet has not been very good over the holidays. No open sores or wounds. No hypoglycemia.   Pt feels like doxepin is not keeping her asleep like it had been. She denies any of the claustrophobic feelings.   She is having some productive cough, SOB, chest tightness. She continues to smoke. No fever, chills, sinus pressure, ear pain.   .. Active Ambulatory Problems    Diagnosis Date Noted  . Type II diabetes mellitus, uncontrolled ([place]) 02/03/2014  . Unspecified asthma(493.90) 02/03/2014  . Essential hypertension, benign 02/03/2014  . Neuropathy 02/05/2014  . IT band syndrome 02/05/2014  . Sinus tachycardia 02/05/2014  . Memory changes 02/05/2014  . Word finding difficulty 02/05/2014  . Cervical cancer ([place]) 02/05/2014  . Depression 05/19/2014  . Anxiety 05/19/2014  . Low back pain with radiation 05/19/2014  . DDD (degenerative disc disease), lumbar 05/19/2014  . PTSD (post-traumatic stress disorder) 09/26/2014  . Foot callus 10/03/2014  . Gastroesophageal reflux disease with esophagitis 01/10/2015  . Cerumen impaction 01/24/2015  . Hyperlipidemia 01/24/2015  . Pulmonary emphysema ([place]) 07/26/2012  . Morbid obesity ([place]) 08/31/2017  . Insomnia due to other mental disorder 10/27/2017  . History of substance abuse ([place]) 10/27/2017  . GAD (generalized anxiety disorder) 10/27/2017  . Essential hypertension 11/10/2017  . Lower leg edema 11/10/2017  . Current smoker 06/08/2018  . Diabetes mellitus, type II, insulin dependent ([place]) 06/08/2018  . Claustrophobia 06/08/2018  . Primary insomnia 06/08/2018  . Hot flashes 06/08/2018  . Abnormal weight gain 06/08/2018  . Acute stress reaction 09/12/2018    Resolved Ambulatory Problems    Diagnosis Date Noted  . Bruised rib 09/26/2014  . Bruised ribs 09/26/2014  . Wound of right leg 09/26/2014  . Former smoker 03/09/2018   Past Medical History:  Diagnosis Date  . Asthma   . Diabetes mellitus without complication ([place])   . History of alcohol abuse   . History of marijuana use   . Hypertension   . Substance abuse ([place])        Review of Systems  All other systems reviewed and are negative.      Objective:   Physical Exam Vitals signs reviewed.  Constitutional:      Appearance: Normal appearance. She is obese.  HENT:     Head: Normocephalic and atraumatic.  Cardiovascular:     Rate and Rhythm: Normal rate and regular rhythm.     Pulses: Normal pulses.  Pulmonary:     Effort: Pulmonary effort is normal.     Comments: Coarse breath sounds.  Neurological:     Mental Status: She is alert.  Psychiatric:        Behavior: Behavior normal.           Assessment & Plan:  [name] KitchenShonta was seen today for follow-up.  Diagnoses and all orders for this visit:  Uncontrolled type 2 diabetes mellitus with hyperglycemia ([place]) -     POCT glycosylated hemoglobin (Hb A1C)  Diabetes mellitus, type II, insulin dependent (HCC) -     glipiZIDE (GLUCOTROL XL) 10 MG 24 hr tablet; TAKE 1 TABLET BY MOUTH ONCE DAILY WITH BREAKFAST -  liraglutide (VICTOZA) 18 MG/3ML SOPN; Inject 0.2 mLs (1.2 mg total) into the skin daily. Please include needles. -     sitaGLIPtin (JANUVIA) 100 MG tablet; Take 1 tablet (100 mg total) by mouth daily.  Current smoker -     buPROPion (WELLBUTRIN SR) 150 MG 12 hr tablet; Take 1 tablet (150 mg total) by mouth 2 (two) times daily.  Claustrophobia -     doxepin (SINEQUAN) 75 MG capsule; Take 1 capsule (75 mg total) by mouth at bedtime.  Primary insomnia -     doxepin (SINEQUAN) 75 MG capsule; Take 1 capsule (75 mg total) by mouth at bedtime.  Acute bronchitis, unspecified organism -     predniSONE  (DELTASONE) 50 MG tablet; One tab PO daily for 5 days.    Lab Results  Component Value Date   HGBA1C 7.0 (A) 01/19/2019   A!C increased a little.  For now no medication changes.  Continue to watch sugars and carbs.    .. Diabetic Foot Exam - Simple   Simple Foot Form Visual Inspection No deformities, no ulcerations, no other skin breakdown bilaterally:  Yes Sensation Testing Intact to touch and monofilament testing bilaterally:  Yes Pulse Check Posterior Tibialis and Dorsalis pulse intact bilaterally:  Yes Comments    Discussed smoking cessation. She will continue wellbutrin but not helping that much with cravings. Self pay and cannot afford chantix. Pt likely has some mild COPD. Likely represents a minor flare/actue bronchitis. Prednisone burst given.    Per patient has eye exam done.   Follow up in 3 months.

## 2019-01-21 ENCOUNTER — Encounter: Payer: Self-pay | Admitting: Physician Assistant

## 2019-02-10 ENCOUNTER — Telehealth: Payer: Self-pay

## 2019-02-10 ENCOUNTER — Other Ambulatory Visit: Payer: Self-pay

## 2019-02-10 DIAGNOSIS — F5101 Primary insomnia: Secondary | ICD-10-CM

## 2019-02-10 DIAGNOSIS — F40240 Claustrophobia: Secondary | ICD-10-CM

## 2019-02-10 MED ORDER — DOXEPIN HCL 25 MG PO CAPS: ORAL_CAPSULE | ORAL | 0 refills | Status: DC

## 2019-02-10 NOTE — Telephone Encounter (Signed)
Patient called today requesting a refill on the Doxepin 25 mg. Patient states whenever she came into the office for her last visit with PCP, she was having more issues with sleeping. Patient's doxepin was then increased to 75 mg. Patient was wanting a refill on the 25 mg doxepin as she has been sleeping much better, and does not feel as though she needs the 75 mg. I have changed patient's medication and have sent out another refill of doxepin 25 mg. Patient has been notified. No further questions or concerns at this time.

## 2019-02-11 ENCOUNTER — Telehealth: Payer: Self-pay

## 2019-02-11 NOTE — Telephone Encounter (Signed)
Routing to [place] to resend..this is not our pt.Kathleen Le, Kathleen Le, CMA

## 2019-02-11 NOTE — Telephone Encounter (Signed)
[facility] called and state they can't except the fax because the [employer] Sheet has a different fax number than the fax it was faxed from. They request you fax it back from the fax with the [phone].

## 2019-03-10 ENCOUNTER — Other Ambulatory Visit: Payer: Self-pay | Admitting: Physician Assistant

## 2019-03-10 DIAGNOSIS — F40240 Claustrophobia: Secondary | ICD-10-CM

## 2019-03-10 DIAGNOSIS — F5101 Primary insomnia: Secondary | ICD-10-CM

## 2019-03-11 ENCOUNTER — Other Ambulatory Visit: Payer: Self-pay | Admitting: Physician Assistant

## 2019-04-12 ENCOUNTER — Other Ambulatory Visit: Payer: Self-pay | Admitting: Physician Assistant

## 2019-04-12 DIAGNOSIS — F5101 Primary insomnia: Secondary | ICD-10-CM

## 2019-04-12 DIAGNOSIS — F40240 Claustrophobia: Secondary | ICD-10-CM

## 2019-04-12 NOTE — Telephone Encounter (Signed)
Last RX written 03/10/19 for the 25 mg RX #60 no RF  Pt also has 75 mg dose on profile that was last sent 01/19/19 for #90 with 1 RF  Please advise on pended RF request for 25 mg dose  Last OV was 01/19/19, would you like for me to set pt up for virtual visit?

## 2019-04-12 NOTE — Telephone Encounter (Signed)
scheduled

## 2019-04-12 NOTE — Telephone Encounter (Signed)
Yes please for virtual visit to get this straight.

## 2019-04-13 ENCOUNTER — Telehealth (INDEPENDENT_AMBULATORY_CARE_PROVIDER_SITE_OTHER): Payer: Self-pay | Admitting: Physician Assistant

## 2019-04-13 VITALS — Ht 65.0 in | Wt 252.0 lb

## 2019-04-13 DIAGNOSIS — F5101 Primary insomnia: Secondary | ICD-10-CM

## 2019-04-13 DIAGNOSIS — F40240 Claustrophobia: Secondary | ICD-10-CM

## 2019-04-13 DIAGNOSIS — E1165 Type 2 diabetes mellitus with hyperglycemia: Secondary | ICD-10-CM

## 2019-04-13 DIAGNOSIS — R059 Cough, unspecified: Secondary | ICD-10-CM

## 2019-04-13 DIAGNOSIS — J309 Allergic rhinitis, unspecified: Secondary | ICD-10-CM

## 2019-04-13 DIAGNOSIS — R05 Cough: Secondary | ICD-10-CM

## 2019-04-13 MED ORDER — DOXEPIN HCL 25 MG PO CAPS: 50.0000 mg | ORAL_CAPSULE | Freq: Every evening | ORAL | 5 refills | Status: DC | PRN

## 2019-04-13 NOTE — Progress Notes (Deleted)
States she is taking Doxepin 25 mg - 2 tablets at bedtime for sleep. Helping some, but she thinking sleeping issues is more worry from taking care of her mother with Alzheimer's disease and being scared she is going to get up.

## 2019-04-15 ENCOUNTER — Encounter: Payer: Self-pay | Admitting: Physician Assistant

## 2019-04-15 NOTE — Progress Notes (Signed)
Patient ID: Kathleen Le, female   DOB: 04-30-1961, 58 y.o.   MRN: [id number] .Marland KitchenVirtual Visit via Video Note  I connected with Kathleen Le on 04/15/19 at  3:20 PM EDT by a video enabled telemedicine application and verified that I am speaking with the correct person using two identifiers.   I discussed the limitations of evaluation and management by telemedicine and the availability of in person appointments. The patient expressed understanding and agreed to proceed.  History of Present Illness: Pt is a 58 yo female with insomnia, T2DM, HTN, emphysema, GERD who presents to the clinic for medication refills.   She is doing well on doxepin. No concerns. She continues to have some trouble sleeping but usually because she is worried about her mother in law with alzheimer waking up and getting hurt.   She does have a little cough and sinus pressure and nasal congestion. Going on for last few days. No fever, chills, body aches. She does have allergies. She has done nothing to make better. Cough seems to be worse at night.   .. Active Ambulatory Problems    Diagnosis Date Noted  . Type II diabetes mellitus, uncontrolled ([place]) 02/03/2014  . Unspecified asthma(493.90) 02/03/2014  . Essential hypertension, benign 02/03/2014  . Neuropathy 02/05/2014  . IT band syndrome 02/05/2014  . Sinus tachycardia 02/05/2014  . Memory changes 02/05/2014  . Word finding difficulty 02/05/2014  . Cervical cancer ([place]) 02/05/2014  . Depression 05/19/2014  . Anxiety 05/19/2014  . Low back pain with radiation 05/19/2014  . DDD (degenerative disc disease), lumbar 05/19/2014  . PTSD (post-traumatic stress disorder) 09/26/2014  . Foot callus 10/03/2014  . Gastroesophageal reflux disease with esophagitis 01/10/2015  . Cerumen impaction 01/24/2015  . Hyperlipidemia 01/24/2015  . Pulmonary emphysema ([place]) 07/26/2012  . Morbid obesity ([place]) 08/31/2017  . Insomnia due to other mental disorder 10/27/2017  . History of  substance abuse ([place]) 10/27/2017  . GAD (generalized anxiety disorder) 10/27/2017  . Essential hypertension 11/10/2017  . Lower leg edema 11/10/2017  . Current smoker 06/08/2018  . Diabetes mellitus, type II, insulin dependent ([place]) 06/08/2018  . Claustrophobia 06/08/2018  . Primary insomnia 06/08/2018  . Hot flashes 06/08/2018  . Abnormal weight gain 06/08/2018  . Acute stress reaction 09/12/2018   Resolved Ambulatory Problems    Diagnosis Date Noted  . Bruised rib 09/26/2014  . Bruised ribs 09/26/2014  . Wound of right leg 09/26/2014  . Former smoker 03/09/2018   Past Medical History:  Diagnosis Date  . Asthma   . Diabetes mellitus without complication ([place])   . History of alcohol abuse   . History of marijuana use   . Hypertension   . Substance abuse ([place])    Reviewed med, allergy, problem list.     Observations/Objective: No acute distress.  No labored breathing.   No vitals obtained.   Assessment and Plan: [name] KitchenTersea was seen today for medication problem.  Diagnoses and all orders for this visit:  Claustrophobia -     doxepin (SINEQUAN) 25 MG capsule; Take 2 capsules (50 mg total) by mouth at bedtime as needed.  Primary insomnia -     doxepin (SINEQUAN) 25 MG capsule; Take 2 capsules (50 mg total) by mouth at bedtime as needed.  Allergic sinusitis  Cough  Type 2 diabetes mellitus with hyperglycemia, without long-term current use of insulin (HCC) -     Hemoglobin A1c   Pt will be getting insurance soon. Hold on A!C until insurance has kicked in.  Refilled doxepin.   Discussed sinusitis seems allergic. Start flonase daily. Consider adding mucinex as well. If not improving by Friday could consider abx for sinusitis.    Follow Up Instructions:    I discussed the assessment and treatment plan with the patient. The patient was provided an opportunity to ask questions and all were answered. The patient agreed with the plan and demonstrated an  understanding of the instructions.   The patient was advised to call back or seek an in-person evaluation if the symptoms worsen or if the condition fails to improve as anticipated.  I provided 15 minutes of non-face-to-face time during this encounter.   Iran Planas, PA-C

## 2019-04-20 ENCOUNTER — Ambulatory Visit: Payer: Self-pay | Admitting: Physician Assistant

## 2019-05-31 ENCOUNTER — Other Ambulatory Visit: Payer: Self-pay | Admitting: Physician Assistant

## 2019-06-01 ENCOUNTER — Other Ambulatory Visit: Payer: Self-pay | Admitting: Physician Assistant

## 2019-06-01 ENCOUNTER — Telehealth: Payer: Self-pay | Admitting: Neurology

## 2019-06-01 ENCOUNTER — Encounter: Payer: Self-pay | Admitting: Physician Assistant

## 2019-06-01 MED ORDER — ALBUTEROL SULFATE HFA 108 (90 BASE) MCG/ACT IN AERS: 2.0000 | INHALATION_SPRAY | Freq: Four times a day (QID) | RESPIRATORY_TRACT | 0 refills | Status: DC | PRN

## 2019-06-01 NOTE — Telephone Encounter (Signed)
Patient made aware Victoza RX has been delivered here and she can pick up at the office. She expressed appreciation. Will come today or tomorrow.

## 2019-06-15 ENCOUNTER — Encounter: Payer: Self-pay | Admitting: Physician Assistant

## 2019-06-25 ENCOUNTER — Other Ambulatory Visit: Payer: Self-pay | Admitting: Physician Assistant

## 2019-06-25 DIAGNOSIS — E119 Type 2 diabetes mellitus without complications: Secondary | ICD-10-CM

## 2019-06-25 DIAGNOSIS — Z794 Long term (current) use of insulin: Secondary | ICD-10-CM

## 2019-06-25 DIAGNOSIS — I10 Essential (primary) hypertension: Secondary | ICD-10-CM

## 2019-07-29 ENCOUNTER — Other Ambulatory Visit: Payer: Self-pay | Admitting: Physician Assistant

## 2019-07-29 LAB — HEMOGLOBIN A1C
Hgb A1c MFr Bld: 8 % of total Hgb — ABNORMAL HIGH (ref <5.7)
Mean Plasma Glucose: 183 (calc)
eAG (mmol/L): 10.1 (calc)

## 2019-07-29 MED ORDER — INSULIN NPH (HUMAN) (ISOPHANE) 100 UNIT/ML ~~LOC~~ SUSP: 10.0000 [IU] | Freq: Every day | SUBCUTANEOUS | 11 refills | Status: DC

## 2019-07-29 NOTE — Progress Notes (Signed)
relio

## 2019-07-29 NOTE — Progress Notes (Signed)
I sent [facility] cheap insulin novolin N to start at bedtime 10 units. Increase by 2 units every 5 days until fasting glucose 90-130 or reach 40 units. Let me know if you are having trouble with this.

## 2019-07-29 NOTE — Progress Notes (Signed)
A!C back up again to 8.0. have you been taking all your medication or having problems getting anything.

## 2019-08-02 ENCOUNTER — Encounter: Payer: Self-pay | Admitting: Physician Assistant

## 2019-08-05 ENCOUNTER — Other Ambulatory Visit: Payer: Self-pay | Admitting: Physician Assistant

## 2019-08-05 DIAGNOSIS — I10 Essential (primary) hypertension: Secondary | ICD-10-CM

## 2019-08-15 ENCOUNTER — Telehealth: Payer: Self-pay | Admitting: Neurology

## 2019-08-15 NOTE — Telephone Encounter (Signed)
Left message on machine for patient to call back.  Prescription lifeline forms complete for Victoza. It states needs her 1040 forms with it. Awaiting call back to see if patient would like [place] to fax what we have or if she wants to pick up paperwork. Awaiting call back.

## 2019-08-15 NOTE — Telephone Encounter (Signed)
Patient called back and requested we fax what we have. I faxed forms to both numbers on paperwork [phone] and [phone]) with confirmation received. Copy to scanning.

## 2019-08-16 NOTE — Telephone Encounter (Signed)
Patient came in the office to pick up her samples and she states that she was missing the pen needles that were on the back of the form. I spoke with the patient and she is aware I will fax the order to get her some needles. She did not have any other questions. Received confirmation and advised patient we would call her once the supplies came in the office.

## 2019-08-16 NOTE — Telephone Encounter (Signed)
Recived 4 boxes of Victoza for this patient form patient assistance. Patient has been notifed and will pick up today.  Lot Number: [id number] NDC: [id number] Expire: 06/27/2021

## 2019-08-23 ENCOUNTER — Other Ambulatory Visit: Payer: Self-pay | Admitting: Physician Assistant

## 2019-08-23 DIAGNOSIS — I10 Essential (primary) hypertension: Secondary | ICD-10-CM

## 2019-08-24 ENCOUNTER — Other Ambulatory Visit: Payer: Self-pay | Admitting: Physician Assistant

## 2019-08-25 ENCOUNTER — Encounter: Payer: Self-pay | Admitting: Physician Assistant

## 2019-08-25 ENCOUNTER — Other Ambulatory Visit: Payer: Self-pay | Admitting: Physician Assistant

## 2019-08-25 DIAGNOSIS — E119 Type 2 diabetes mellitus without complications: Secondary | ICD-10-CM

## 2019-08-25 DIAGNOSIS — Z794 Long term (current) use of insulin: Secondary | ICD-10-CM

## 2019-08-25 MED ORDER — ALBUTEROL SULFATE HFA 108 (90 BASE) MCG/ACT IN AERS: 2.0000 | INHALATION_SPRAY | Freq: Four times a day (QID) | RESPIRATORY_TRACT | 0 refills | Status: DC | PRN

## 2019-08-25 NOTE — Telephone Encounter (Signed)
Needs appointment

## 2019-09-21 ENCOUNTER — Other Ambulatory Visit: Payer: Self-pay | Admitting: Physician Assistant

## 2019-09-21 DIAGNOSIS — I10 Essential (primary) hypertension: Secondary | ICD-10-CM

## 2019-09-26 ENCOUNTER — Other Ambulatory Visit: Payer: Self-pay | Admitting: Physician Assistant

## 2019-09-26 DIAGNOSIS — Z794 Long term (current) use of insulin: Secondary | ICD-10-CM

## 2019-09-26 DIAGNOSIS — E119 Type 2 diabetes mellitus without complications: Secondary | ICD-10-CM

## 2019-10-03 ENCOUNTER — Other Ambulatory Visit: Payer: Self-pay | Admitting: Physician Assistant

## 2019-10-03 DIAGNOSIS — Z794 Long term (current) use of insulin: Secondary | ICD-10-CM

## 2019-10-03 DIAGNOSIS — E119 Type 2 diabetes mellitus without complications: Secondary | ICD-10-CM

## 2019-10-21 ENCOUNTER — Telehealth: Payer: Self-pay | Admitting: Neurology

## 2019-10-21 NOTE — Telephone Encounter (Signed)
Prescription lifeline forms completed and mailed. Copy to scan.

## 2019-10-23 ENCOUNTER — Other Ambulatory Visit: Payer: Self-pay | Admitting: Physician Assistant

## 2019-10-24 ENCOUNTER — Other Ambulatory Visit: Payer: Self-pay | Admitting: Physician Assistant

## 2019-10-24 ENCOUNTER — Encounter: Payer: Self-pay | Admitting: Physician Assistant

## 2019-10-24 DIAGNOSIS — J439 Emphysema, unspecified: Secondary | ICD-10-CM

## 2019-10-26 MED ORDER — BREO ELLIPTA 100-25 MCG/INH IN AEPB: 1.0000 | INHALATION_SPRAY | Freq: Every day | RESPIRATORY_TRACT | 11 refills | Status: DC

## 2019-10-28 MED ORDER — BUDESONIDE-FORMOTEROL FUMARATE 160-4.5 MCG/ACT IN AERO: 2.0000 | INHALATION_SPRAY | Freq: Two times a day (BID) | RESPIRATORY_TRACT | 0 refills | Status: DC

## 2019-10-28 NOTE — Addendum Note (Signed)
Addended by: Towana Badger on: 10/28/2019 03:21 PM   Modules accepted: Orders

## 2019-10-28 NOTE — Telephone Encounter (Signed)
Pt advised via MyChart. Sample up front for pt to pick up. Med list updated.

## 2019-10-28 NOTE — Telephone Encounter (Signed)
160- 2 puffs twice a day.

## 2019-10-28 NOTE — Telephone Encounter (Signed)
Can we give her symbicort to try for a month just to see how she benefits from it?

## 2019-11-01 ENCOUNTER — Other Ambulatory Visit: Payer: Self-pay | Admitting: Physician Assistant

## 2019-11-01 DIAGNOSIS — I10 Essential (primary) hypertension: Secondary | ICD-10-CM

## 2019-11-03 ENCOUNTER — Other Ambulatory Visit: Payer: Self-pay | Admitting: Physician Assistant

## 2019-11-03 DIAGNOSIS — I10 Essential (primary) hypertension: Secondary | ICD-10-CM

## 2019-11-07 ENCOUNTER — Encounter: Payer: Self-pay | Admitting: Physician Assistant

## 2019-11-10 ENCOUNTER — Other Ambulatory Visit: Payer: Self-pay | Admitting: Physician Assistant

## 2019-11-10 DIAGNOSIS — F40240 Claustrophobia: Secondary | ICD-10-CM

## 2019-11-10 DIAGNOSIS — F5101 Primary insomnia: Secondary | ICD-10-CM

## 2019-11-14 ENCOUNTER — Encounter: Payer: Self-pay | Admitting: Physician Assistant

## 2019-11-14 DIAGNOSIS — J439 Emphysema, unspecified: Secondary | ICD-10-CM

## 2019-11-15 MED ORDER — BUDESONIDE-FORMOTEROL FUMARATE 160-4.5 MCG/ACT IN AERO: 2.0000 | INHALATION_SPRAY | Freq: Two times a day (BID) | RESPIRATORY_TRACT | 5 refills | Status: DC

## 2019-11-15 NOTE — Telephone Encounter (Signed)
Get another symbicort sample for her.

## 2019-11-15 NOTE — Telephone Encounter (Signed)
We only have one box of 80 left and we are not able to get anymore of it since its generic. Please advise.

## 2019-11-18 ENCOUNTER — Encounter: Payer: Self-pay | Admitting: Physician Assistant

## 2019-11-18 ENCOUNTER — Other Ambulatory Visit: Payer: Self-pay | Admitting: Physician Assistant

## 2019-11-18 DIAGNOSIS — I10 Essential (primary) hypertension: Secondary | ICD-10-CM

## 2019-11-21 ENCOUNTER — Ambulatory Visit (INDEPENDENT_AMBULATORY_CARE_PROVIDER_SITE_OTHER): Payer: No Typology Code available for payment source | Admitting: Physician Assistant

## 2019-11-21 ENCOUNTER — Other Ambulatory Visit: Payer: Self-pay

## 2019-11-21 VITALS — BP 160/90 | HR 92 | Ht 65.0 in | Wt 250.0 lb

## 2019-11-21 DIAGNOSIS — Z1159 Encounter for screening for other viral diseases: Secondary | ICD-10-CM

## 2019-11-21 DIAGNOSIS — E119 Type 2 diabetes mellitus without complications: Secondary | ICD-10-CM

## 2019-11-21 DIAGNOSIS — F40240 Claustrophobia: Secondary | ICD-10-CM | POA: Diagnosis not present

## 2019-11-21 DIAGNOSIS — I10 Essential (primary) hypertension: Secondary | ICD-10-CM | POA: Diagnosis not present

## 2019-11-21 DIAGNOSIS — F5101 Primary insomnia: Secondary | ICD-10-CM | POA: Diagnosis not present

## 2019-11-21 DIAGNOSIS — E1165 Type 2 diabetes mellitus with hyperglycemia: Secondary | ICD-10-CM

## 2019-11-21 DIAGNOSIS — Z794 Long term (current) use of insulin: Secondary | ICD-10-CM

## 2019-11-21 LAB — POCT GLYCOSYLATED HEMOGLOBIN (HGB A1C): Hemoglobin A1C: 6 % — AB (ref 4.0–5.6)

## 2019-11-21 MED ORDER — GLIPIZIDE ER 10 MG PO TB24: ORAL_TABLET | ORAL | 0 refills | Status: DC

## 2019-11-21 MED ORDER — VICTOZA 18 MG/3ML ~~LOC~~ SOPN: 1.2000 mg | PEN_INJECTOR | Freq: Every day | SUBCUTANEOUS | 3 refills | Status: DC

## 2019-11-21 MED ORDER — SIMVASTATIN 20 MG PO TABS: 20.0000 mg | ORAL_TABLET | Freq: Every day | ORAL | 3 refills | Status: DC

## 2019-11-21 MED ORDER — PIOGLITAZONE HCL 30 MG PO TABS: 30.0000 mg | ORAL_TABLET | Freq: Every day | ORAL | 3 refills | Status: DC

## 2019-11-21 MED ORDER — ATENOLOL 50 MG PO TABS: 50.0000 mg | ORAL_TABLET | Freq: Every day | ORAL | 3 refills | Status: DC

## 2019-11-21 MED ORDER — AMLODIPINE BESYLATE 5 MG PO TABS: 5.0000 mg | ORAL_TABLET | Freq: Every day | ORAL | 3 refills | Status: DC

## 2019-11-21 MED ORDER — LISINOPRIL 5 MG PO TABS: 5.0000 mg | ORAL_TABLET | Freq: Every day | ORAL | 3 refills | Status: DC

## 2019-11-21 MED ORDER — DOXEPIN HCL 25 MG PO CAPS: 50.0000 mg | ORAL_CAPSULE | Freq: Every evening | ORAL | 3 refills | Status: DC | PRN

## 2019-11-21 MED ORDER — HYDROCHLOROTHIAZIDE 25 MG PO TABS: ORAL_TABLET | ORAL | 3 refills | Status: DC

## 2019-11-21 MED ORDER — SITAGLIPTIN PHOSPHATE 100 MG PO TABS: 100.0000 mg | ORAL_TABLET | Freq: Every day | ORAL | 3 refills | Status: DC

## 2019-11-21 MED ORDER — TRIAMCINOLONE ACETONIDE 0.1 % EX CREA: 1.0000 "application " | TOPICAL_CREAM | Freq: Two times a day (BID) | CUTANEOUS | 0 refills | Status: DC

## 2019-11-21 MED ORDER — METFORMIN HCL 1000 MG PO TABS: 1000.0000 mg | ORAL_TABLET | Freq: Two times a day (BID) | ORAL | 3 refills | Status: DC

## 2019-11-21 NOTE — Progress Notes (Signed)
Subjective:    Patient ID: Kathleen Le, female    DOB: 1961-09-17, 58 y.o.   MRN: [id number]  HPI  Pt is a 58 yo female with HTN, T2DM, insomnia who presents to the clinic for 3 month follow up.   She is doing really good. She denies any problems or concerns. She is compliant with medications. She is taking 14 units at bedtime. At times glucose is 67 in am. She denies any hypoglycemic symptoms. No open sores or wounds. No CP, palpitations, SOb, vision changes. She is not exercising or eating right.    .. Active Ambulatory Problems    Diagnosis Date Noted  . Type II diabetes mellitus, uncontrolled ([place]) 02/03/2014  . Unspecified asthma(493.90) 02/03/2014  . Essential hypertension, benign 02/03/2014  . Neuropathy 02/05/2014  . IT band syndrome 02/05/2014  . Sinus tachycardia 02/05/2014  . Memory changes 02/05/2014  . Word finding difficulty 02/05/2014  . Cervical cancer ([place]) 02/05/2014  . Depression 05/19/2014  . Anxiety 05/19/2014  . Low back pain with radiation 05/19/2014  . DDD (degenerative disc disease), lumbar 05/19/2014  . PTSD (post-traumatic stress disorder) 09/26/2014  . Foot callus 10/03/2014  . Gastroesophageal reflux disease with esophagitis 01/10/2015  . Cerumen impaction 01/24/2015  . Hyperlipidemia 01/24/2015  . Pulmonary emphysema ([place]) 07/26/2012  . Morbid obesity ([place]) 08/31/2017  . Insomnia due to other mental disorder 10/27/2017  . History of substance abuse ([place]) 10/27/2017  . GAD (generalized anxiety disorder) 10/27/2017  . Essential hypertension 11/10/2017  . Lower leg edema 11/10/2017  . Current smoker 06/08/2018  . Diabetes mellitus, type II, insulin dependent ([place]) 06/08/2018  . Claustrophobia 06/08/2018  . Primary insomnia 06/08/2018  . Hot flashes 06/08/2018  . Abnormal weight gain 06/08/2018  . Acute stress reaction 09/12/2018   Resolved Ambulatory Problems    Diagnosis Date Noted  . Bruised rib 09/26/2014  . Bruised ribs 09/26/2014  .  Wound of right leg 09/26/2014  . Former smoker 03/09/2018   Past Medical History:  Diagnosis Date  . Asthma   . Diabetes mellitus without complication ([place])   . History of alcohol abuse   . History of marijuana use   . Hypertension   . Substance abuse ([place])      Review of Systems  All other systems reviewed and are negative.       Objective:   Physical Exam Vitals signs reviewed.  Constitutional:      Appearance: Normal appearance. She is obese.  HENT:     Head: Normocephalic.  Neck:     Musculoskeletal: Normal range of motion.  Cardiovascular:     Rate and Rhythm: Normal rate and regular rhythm.     Pulses: Normal pulses.  Pulmonary:     Effort: Pulmonary effort is normal.     Breath sounds: Normal breath sounds.  Neurological:     General: No focal deficit present.     Mental Status: She is alert and oriented to person, place, and time.  Psychiatric:        Mood and Affect: Mood normal.           Assessment & Plan:  [name] KitchenTajae was seen today for diabetes.  Diagnoses and all orders for this visit:  Diabetes mellitus, type II, insulin dependent (HCC) -     glipiZIDE (GLUCOTROL XL) 10 MG 24 hr tablet; TAKE 1 TABLET BY MOUTH ONCE DAILY WITH BREAKFAST. -     liraglutide (VICTOZA) 18 MG/3ML SOPN; Inject 0.2 mLs (1.2 mg total)  into the skin daily. Please include needles. -     lisinopril (ZESTRIL) 5 MG tablet; Take 1 tablet (5 mg total) by mouth daily. -     metFORMIN (GLUCOPHAGE) 1000 MG tablet; Take 1 tablet (1,000 mg total) by mouth 2 (two) times daily with a meal. -     pioglitazone (ACTOS) 30 MG tablet; Take 1 tablet (30 mg total) by mouth daily. -     Discontinue: simvastatin (ZOCOR) 20 MG tablet; Take 1 tablet (20 mg total) by mouth daily. -     sitaGLIPtin (JANUVIA) 100 MG tablet; Take 1 tablet (100 mg total) by mouth daily. -     Lipid Panel w/reflex Direct LDL  Type 2 diabetes mellitus with hyperglycemia, without long-term current use of insulin (HCC) -      POCT glycosylated hemoglobin (Hb A1C) -     COMPLETE METABOLIC PANEL WITH GFR  Essential hypertension, benign -     amLODipine (NORVASC) 5 MG tablet; Take 1 tablet (5 mg total) by mouth daily. -     atenolol (TENORMIN) 50 MG tablet; Take 1 tablet (50 mg total) by mouth daily. -     hydrochlorothiazide (HYDRODIURIL) 25 MG tablet; TAKE 1 TABLET BY MOUTH ONCE DAILY. -     COMPLETE METABOLIC PANEL WITH GFR  Claustrophobia -     doxepin (SINEQUAN) 25 MG capsule; Take 2 capsules (50 mg total) by mouth at bedtime as needed.  Primary insomnia -     doxepin (SINEQUAN) 25 MG capsule; Take 2 capsules (50 mg total) by mouth at bedtime as needed.  Need for hepatitis C screening test -     Hepatitis C Antibody  Other orders -     triamcinolone cream (KENALOG) 0.1 %; Apply 1 application topically 2 (two) times daily.   .. Results for orders placed or performed in visit on 11/21/19  Lipid Panel w/reflex Direct LDL  Result Value Ref Range   Cholesterol 167 <200 mg/dL   HDL 60 > OR = 50 mg/dL   Triglycerides 115 <150 mg/dL   LDL Cholesterol (Calc) 86 mg/dL (calc)   Total CHOL/HDL Ratio 2.8 <5.0 (calc)   Non-HDL Cholesterol (Calc) 107 <130 mg/dL (calc)  COMPLETE METABOLIC PANEL WITH GFR  Result Value Ref Range   Glucose, Bld 93 65 - 99 mg/dL   BUN 22 7 - 25 mg/dL   Creat 0.89 0.50 - 1.05 mg/dL   GFR, Est Non African American 72 > OR = 60 mL/min/1.23m2   GFR, Est African American 84 > OR = 60 mL/min/1.77m2   BUN/Creatinine Ratio NOT APPLICABLE 6 - 22 (calc)   Sodium 142 135 - 146 mmol/L   Potassium 4.5 3.5 - 5.3 mmol/L   Chloride 104 98 - 110 mmol/L   CO2 27 20 - 32 mmol/L   Calcium 9.4 8.6 - 10.4 mg/dL   Total Protein 6.6 6.1 - 8.1 g/dL   Albumin 4.0 3.6 - 5.1 g/dL   Globulin 2.6 1.9 - 3.7 g/dL (calc)   AG Ratio 1.5 1.0 - 2.5 (calc)   Total Bilirubin 0.3 0.2 - 1.2 mg/dL   Alkaline phosphatase (APISO) 78 37 - 153 U/L   AST 11 10 - 35 U/L   ALT 13 6 - 29 U/L  Hepatitis C Antibody   Result Value Ref Range   Hepatitis C Ab NON-REACTIVE NON-REACTI   SIGNAL TO CUT-OFF 0.01 <1.00  POCT glycosylated hemoglobin (Hb A1C)  Result Value Ref Range   Hemoglobin A1C 6.0 (A)  4.0 - 5.6 %   HbA1c POC (<> result, manual entry)     HbA1c, POC (prediabetic range)     HbA1c, POC (controlled diabetic range)     A1C looks great.  Stay on same medications.  Consider decreasing insulin at night if fasting sugars are under 90.  BP not controlled. On Medications. Per patient smoked before she came in. Continue to monitor and send me readings from home.  On statin.  Declined flu shot.  Follow up in 3 months.   Fasting labs ordered.

## 2019-11-22 ENCOUNTER — Other Ambulatory Visit: Payer: Self-pay | Admitting: Neurology

## 2019-11-22 LAB — COMPLETE METABOLIC PANEL WITH GFR
AG Ratio: 1.5 (calc) (ref 1.0–2.5)
ALT: 13 U/L (ref 6–29)
AST: 11 U/L (ref 10–35)
Albumin: 4 g/dL (ref 3.6–5.1)
Alkaline phosphatase (APISO): 78 U/L (ref 37–153)
BUN: 22 mg/dL (ref 7–25)
CO2: 27 mmol/L (ref 20–32)
Calcium: 9.4 mg/dL (ref 8.6–10.4)
Chloride: 104 mmol/L (ref 98–110)
Creat: 0.89 mg/dL (ref 0.50–1.05)
GFR, Est African American: 84 mL/min/{1.73_m2} (ref >=60)
GFR, Est Non African American: 72 mL/min/{1.73_m2} (ref >=60)
Globulin: 2.6 g/dL (calc) (ref 1.9–3.7)
Glucose, Bld: 93 mg/dL (ref 65–99)
Potassium: 4.5 mmol/L (ref 3.5–5.3)
Sodium: 142 mmol/L (ref 135–146)
Total Bilirubin: 0.3 mg/dL (ref 0.2–1.2)
Total Protein: 6.6 g/dL (ref 6.1–8.1)

## 2019-11-22 LAB — LIPID PANEL W/REFLEX DIRECT LDL
Cholesterol: 167 mg/dL (ref <200)
HDL: 60 mg/dL (ref >=50)
LDL Cholesterol (Calc): 86 mg/dL (calc)
Non-HDL Cholesterol (Calc): 107 mg/dL (calc) (ref <130)
Total CHOL/HDL Ratio: 2.8 (calc) (ref <5.0)
Triglycerides: 115 mg/dL (ref <150)

## 2019-11-22 LAB — HEPATITIS C ANTIBODY
Hepatitis C Ab: NONREACTIVE
SIGNAL TO CUT-OFF: 0.01 (ref <1.00)

## 2019-11-22 MED ORDER — SIMVASTATIN 40 MG PO TABS: 40.0000 mg | ORAL_TABLET | Freq: Every day | ORAL | 3 refills | Status: DC

## 2019-11-22 NOTE — Progress Notes (Signed)
Kathleen Le,   Labs do look really good. LDL goal for diabetes is under 70. You are not quite there yet. I had already sent the 20mg  of zocor. Did you pick up? We could just increase that to 40mg  and you would probably get to goal!

## 2019-11-22 NOTE — Progress Notes (Signed)
Can we call the pharmacy and cancel 20mg  order and send 40mg  #90 3 refills.

## 2019-11-27 ENCOUNTER — Encounter: Payer: Self-pay | Admitting: Physician Assistant

## 2020-01-25 ENCOUNTER — Other Ambulatory Visit: Payer: Self-pay | Admitting: Physician Assistant

## 2020-01-27 ENCOUNTER — Telehealth: Payer: Self-pay | Admitting: Physician Assistant

## 2020-01-27 MED ORDER — ALBUTEROL SULFATE HFA 108 (90 BASE) MCG/ACT IN AERS: 2.0000 | INHALATION_SPRAY | Freq: Four times a day (QID) | RESPIRATORY_TRACT | 12 refills | Status: DC | PRN

## 2020-01-27 MED ORDER — ALBUTEROL SULFATE HFA 108 (90 BASE) MCG/ACT IN AERS: 2.0000 | INHALATION_SPRAY | Freq: Four times a day (QID) | RESPIRATORY_TRACT | 0 refills | Status: DC | PRN

## 2020-01-27 NOTE — Telephone Encounter (Signed)
Forms have been faxed and placed in [facility] box. Waiting on a scan.

## 2020-01-27 NOTE — Telephone Encounter (Signed)
Received a fax that patient was needing a new prescription sent to [facility]. I have printed the prescription and will you fax once jade signs.

## 2020-01-27 NOTE — Telephone Encounter (Signed)
Signed.

## 2020-02-06 ENCOUNTER — Telehealth: Payer: Self-pay | Admitting: Neurology

## 2020-02-06 NOTE — Telephone Encounter (Signed)
[facility] paperwork completed and faxed with records to [phone] with confirmation received.

## 2020-02-23 NOTE — Telephone Encounter (Signed)
Received paperwork again, looks like patient needs to fill out her portion of paperwork. Left message on machine for patient letting her know our portion was completed and sent on 02/06/2020, but we do have a portion of paperwork she needs to complete. She is to call back and let us know if they also sent this to her or if she needs to pick up her portion of paperwork to complete. Awaiting call back.

## 2020-02-23 NOTE — Telephone Encounter (Signed)
Patient called back and left vm that she would pick up forms tomorrow.   Forms that need complete and copy of forms already sent left at the front desk for patient pick up.

## 2020-02-28 ENCOUNTER — Other Ambulatory Visit: Payer: Self-pay | Admitting: Physician Assistant

## 2020-02-28 DIAGNOSIS — E119 Type 2 diabetes mellitus without complications: Secondary | ICD-10-CM

## 2020-04-05 ENCOUNTER — Encounter: Payer: Self-pay | Admitting: Physician Assistant

## 2020-04-13 ENCOUNTER — Telehealth (INDEPENDENT_AMBULATORY_CARE_PROVIDER_SITE_OTHER): Payer: No Typology Code available for payment source | Admitting: Nurse Practitioner

## 2020-04-13 ENCOUNTER — Encounter: Payer: Self-pay | Admitting: Nurse Practitioner

## 2020-04-13 VITALS — Temp 98.2°F

## 2020-04-13 DIAGNOSIS — R0981 Nasal congestion: Secondary | ICD-10-CM

## 2020-04-13 DIAGNOSIS — J441 Chronic obstructive pulmonary disease with (acute) exacerbation: Secondary | ICD-10-CM

## 2020-04-13 DIAGNOSIS — R05 Cough: Secondary | ICD-10-CM | POA: Diagnosis not present

## 2020-04-13 DIAGNOSIS — R059 Cough, unspecified: Secondary | ICD-10-CM

## 2020-04-13 MED ORDER — GUAIFENESIN-CODEINE 100-10 MG/5ML PO SOLN: 5.0000 mL | Freq: Four times a day (QID) | ORAL | 0 refills | Status: DC | PRN

## 2020-04-13 MED ORDER — AZITHROMYCIN 250 MG PO TABS: ORAL_TABLET | ORAL | 0 refills | Status: DC

## 2020-04-13 MED ORDER — PREDNISONE 20 MG PO TABS: 20.0000 mg | ORAL_TABLET | Freq: Two times a day (BID) | ORAL | 0 refills | Status: AC

## 2020-04-13 NOTE — Progress Notes (Signed)
Virtual Visit via MyChart Note  I connected with  Kathleen Le on 04/13/20 at 11:20 AM EDT by the video enabled telemedicine application, MyChart, and verified that I am speaking with the correct person using two identifiers.   I introduced myself as a Designer, jewellery with the practice. We discussed the limitations of evaluation and management by telemedicine and the availability of in person appointments. The patient expressed understanding and agreed to proceed.  The patient is: At home I am: In the office  Subjective:    CC: Cough  HPI: Kathleen Le is a 59 y.o. y/o female presenting via [place] [facility] today for COPD exacerbation with productive cough, shortness of breath with exertion, sinus headaches, sinus pain and pressure, runny nose and difficulty sleeping for the past 5 days.  She has been using saline nasal spray and over-the-counter medications without significant improvement of symptoms.  She does report an exacerbation about this time every year.  She has also been using her albuterol inhaler which is helping.  She denies fever, chills, sore throat, nausea, vomiting, diarrhea, loss of taste, loss of smell, or body aches.  Past medical history, Surgical history, Family history not pertinant except as noted below, Social history, Allergies, and medications have been entered into the medical record, reviewed, and corrections made.   Review of Systems:  See HPI for pertinent positive and negatives.  Objective:    General: Speaking clearly in complete sentences without any shortness of breath.  Alert and oriented x3.  Normal judgment. No apparent acute distress.   Impression and Recommendations:    1. COPD exacerbation (HCC) Symptoms and presentation consistent with COPD exacerbation possibly brought on by viral upper respiratory infection.  Given the patient's underlying COPD diagnosis will utilize azithromycin for 5 days in conjunction with prednisone burst.  Patient also  provided with prescription cough medication to help decrease nighttime coughing and allow the patient to rest better. Patient instructed to continue to utilize albuterol inhaler as needed and to monitor worsening symptoms of respiratory distress. If symptoms persist or worsen patient instructed to contact the office. - predniSONE (DELTASONE) 20 MG tablet; Take 1 tablet (20 mg total) by mouth 2 (two) times daily with a meal for 5 days. Take with breakfast and lunch to prevent interference with sleep.  Dispense: 10 tablet; Refill: 0 - azithromycin (ZITHROMAX) 250 MG tablet; Take 2 tabs (500 mg) together on the first day, then 1 tab (250 mg) daily until prescription complete.  Dispense: 10 tablet; Refill: 0 - guaiFENesin-codeine 100-10 MG/5ML syrup; Take 5 mLs by mouth every 6 (six) hours as needed for cough.  Dispense: 118 mL; Refill: 0  2. Cough Cough associated with COPD exacerbation in the setting of allergies-sinus exacerbation.  Prescriptions provided. Patient instructed to contact the office if symptoms worsen or fail to improve. - predniSONE (DELTASONE) 20 MG tablet; Take 1 tablet (20 mg total) by mouth 2 (two) times daily with a meal for 5 days. Take with breakfast and lunch to prevent interference with sleep.  Dispense: 10 tablet; Refill: 0 - azithromycin (ZITHROMAX) 250 MG tablet; Take 2 tabs (500 mg) together on the first day, then 1 tab (250 mg) daily until prescription complete.  Dispense: 10 tablet; Refill: 0 - guaiFENesin-codeine 100-10 MG/5ML syrup; Take 5 mLs by mouth every 6 (six) hours as needed for cough.  Dispense: 118 mL; Refill: 0  3. Sinus congestion Sinus congestion and headache associated with upper respiratory symptoms in the setting of COPD and current smoker.  Given the length of time of presentation it is likely viral in origin. Azithromycin provided for COPD exacerbation.  Patient instructed to contact the office if symptoms persist or worsen when treatment is complete.   At that time may consider Augmentin for more effective coverage of sinusitis. - azithromycin (ZITHROMAX) 250 MG tablet; Take 2 tabs (500 mg) together on the first day, then 1 tab (250 mg) daily until prescription complete.  Dispense: 10 tablet; Refill: 0      I discussed the assessment and treatment plan with the patient. The patient was provided an opportunity to ask questions and all were answered. The patient agreed with the plan and demonstrated an understanding of the instructions.   The patient was advised to call back or seek an in-person evaluation if the symptoms worsen or if the condition fails to improve as anticipated.  I provided 11 minutes of non-face-to-face interaction with this [place] visit.   Orma Render, NP

## 2020-04-17 ENCOUNTER — Encounter: Payer: Self-pay | Admitting: Physician Assistant

## 2020-04-19 ENCOUNTER — Encounter: Payer: Self-pay | Admitting: Physician Assistant

## 2020-04-19 NOTE — Telephone Encounter (Signed)
Prescription lifeline form completed and faxed to [phone] with confirmation received. Form also mailed in envelope attached. For Pioglitazone.

## 2020-04-19 NOTE — Telephone Encounter (Signed)
Routing to covering provider.  °

## 2020-04-20 ENCOUNTER — Encounter: Payer: Self-pay | Admitting: Physician Assistant

## 2020-04-22 ENCOUNTER — Encounter: Payer: Self-pay | Admitting: Nurse Practitioner

## 2020-04-23 ENCOUNTER — Encounter: Payer: Self-pay | Admitting: Nurse Practitioner

## 2020-04-24 ENCOUNTER — Ambulatory Visit (INDEPENDENT_AMBULATORY_CARE_PROVIDER_SITE_OTHER): Payer: Self-pay | Admitting: Nurse Practitioner

## 2020-04-24 ENCOUNTER — Encounter: Payer: Self-pay | Admitting: Nurse Practitioner

## 2020-04-24 ENCOUNTER — Other Ambulatory Visit: Payer: Self-pay

## 2020-04-24 VITALS — BP 139/58 | HR 86 | Ht 65.0 in | Wt 252.0 lb

## 2020-04-24 DIAGNOSIS — E1165 Type 2 diabetes mellitus with hyperglycemia: Secondary | ICD-10-CM

## 2020-04-24 DIAGNOSIS — J449 Chronic obstructive pulmonary disease, unspecified: Secondary | ICD-10-CM

## 2020-04-24 LAB — POCT GLYCOSYLATED HEMOGLOBIN (HGB A1C): Hemoglobin A1C: 6.8 % — AB (ref 4.0–5.6)

## 2020-04-24 NOTE — Patient Instructions (Addendum)
Tips for Eating Away From Home If You Have Diabetes Controlling your blood sugar (glucose) levels can be challenging when you do not prepare your own meals. The following tips can help you manage your diabetes when you eat away from home. If you have questions or if you need help, work with your health care provider or diet and nutrition specialist (dietitian). Planning ahead Plan ahead if you know you will be eating away from home:  Try to eat your meals and snacks at about the same time each day. If you know your meal is going to be later than normal, make sure you have a small snack. Being very hungry can cause you to make unhealthy food choices.  Make a list of restaurants near you that offer healthy choices. If a restaurant has a carry-out menu, take the menu home and plan what you will order ahead of time.  Look up the restaurant you want to eat at online. Many chain and fast-food restaurants list nutritional information online. Use this information to choose the healthiest options and to calculate how many carbohydrates will be in your meal.  Use a carbohydrate-counting book or mobile app to look up the carbohydrate content and serving size of the foods you want to eat. Free foods A "free food" is any food or drink that has less than 5 grams of carbohydrates and less than 20 calories per serving. These food are high in fiber and nutrients and low in calories, carbohydrates, and fats. Free foods include:  Non-starchy vegetables, such as carrots, broccoli, celery, lettuce, or green beans.  Non-sugar drinks, such as water, unsweetened coffee, or unsweetened tea.  Low-calorie salad dressings.  Sugar-free gelatin. Starting meals with a salad full of vegetables is a healthy choice that includes a lot of free foods. Avoid high-calorie salad toppings like bacon, cheese, and high-fat dressings. Ask for your salad dressing to be served on the side so that you dip your fork in the dressing and then  in the salad. This allows you to control how much dressing you eat and still get the flavor with every bite. Choices to control carbohydrates   Ask your server to take away the bread basket or chips from your table.  Choose light yogurt or [place] yogurt instead of non-fat sweetened yogurt.  Order fresh fruit. A salad bar often offers fresh fruit choices. Avoid canned fruit because it is usually packed in sugar or syrup.  Order a salad, and ask for dressing on the side.  Ask for substitutes. For example, if your meal comes with french fries, ask for a side salad or steamed veggies instead. If a meal comes with fried chicken, ask for grilled chicken instead. Beverages  Choose drinks that are low in calories and sugar, such as: ? Water. ? Unsweetened tea or coffee. ? Lowfat milk.  Avoid the following drinks: ? Alcoholic beverages. ? Regular (not diet) sodas. Other tips  If you take insulin, wait to take your insulin once your food arrives to your table. This will ensure that your insulin and your food are timed correctly.  Become familiar with serving sizes and learn to recognize how many servings are in a portion. Restaurant portions are typically two to three times larger than what you really need.  Ask your server for a to-go box at the beginning of the meal. When your food comes, leave the amount you should have on your plate, and put the rest in the to-go box so that you  are not tempted to eat too much.  Consider splitting an entree with someone and ordering a side salad.  Avoid buffets. They are typically too tempting and result in overeating. Where to find more information  American Diabetes Association: www.diabetes.org  American Association of Diabetes Educators: www.diabeteseducator.org Summary  Plan ahead when eating away from home.  Try to eat your meals and snacks at about the same time each day. If you know your meal is going to be later than normal, make sure you  have a small snack. Being very hungry can cause you to make unhealthy food choices.  Ask for substitutes. For example, if your meal comes with french fries, ask for a side salad or steamed veggies instead. If a meal comes with fried chicken, ask for grilled chicken instead.  Ask for a to-go box when you order your meal. Divide your meal before you start eating. This information is not intended to replace advice given to you by your health care provider. Make sure you discuss any questions you have with your health care provider. Document Revised: 03/25/2017 Document Reviewed: 03/25/2017 Elsevier Patient Education  2020 Elsevier Inc.  

## 2020-04-24 NOTE — Progress Notes (Signed)
Established Patient Office Visit  Subjective:  Patient ID: Kathleen Le, female    DOB: 10/25/1961  Age: 59 y.o. MRN: [id number]  CC:  Chief Complaint  Patient presents with  . Diabetes    HPI [name] presents for follow-up with diabetes. She has been under a tremendous amount of stress lately with helping to care for her disabled mother who lives at [facility]. She is traveling back and forth 3-4 times per week and has been eating a lot more fast food and not cooking. She also reports that she has recently tapered off of her liraglutide (Victoza) due to cost issues. She reports her fasting blood sugars are typically good. She is usually in the 110-120 range. Her highest blood sugars are typically occurring in the afternoon. She reports numbers in the 200's. She states that her breakfast typically consists of biscuits and other foods high in carbohydrates.   She is utilizing NPH at bedtime and is currently up to 16units per day. She is also taking 2000mg  of Metformin, 10mg  of glipizide, 30mg  of pioglitazone, and 100mg  of sitagliptin daily for diabetes control.   She is also have some lingering shortness of breath after a recent upper respiratory infection/COPD exacerbation. She feels that her symptoms are better with albuterol. She has used an ICS in the past, and reports that she felt "terrific" while using this, but this is cost prohibitive at this time. She is interested in a sample if we have any available. She does report increased allergy symptoms and exacerbations this time of year. In the past she has utilized Zyrtec with success, however it does make her a little sleepy.    Past Medical History:  Diagnosis Date  . Asthma   . Diabetes mellitus without complication ([place])   . History of alcohol abuse   . History of marijuana use   . Hypertension   . Substance abuse [facility])     Past Surgical History:  Procedure Laterality Date  . DNC      Family History  Problem Relation Age  of Onset  . Cancer Paternal Grandmother   . Diabetes Paternal Grandmother   . Diabetes Paternal Grandfather   . Heart attack Maternal Aunt   . Cancer Maternal Uncle   . Cancer Maternal Grandmother   . Heart attack Mother   . Cancer Mother   . Stroke Mother   . Diabetes Father   . Heart attack Father   . Diabetes Paternal Uncle   . Diabetes Paternal Uncle     Social History   Socioeconomic History  . Marital status: Married    Spouse name: Not on file  . Number of children: Not on file  . Years of education: Not on file  . Highest education level: Not on file  Occupational History  . Not on file  Tobacco Use  . Smoking status: Former Research scientist (life sciences)  . Smokeless tobacco: Never Used  Substance and Sexual Activity  . Alcohol use: No  . Drug use: No  . Sexual activity: Yes  Other Topics Concern  . Not on file  Social History Narrative  . Not on file   Social Determinants of Health   Financial Resource Strain:   . Difficulty of Paying Living Expenses:   Food Insecurity:   . Worried About Charity fundraiser in the Last Year:   . Arboriculturist in the Last Year:   Transportation Needs:   . Film/video editor (Medical):   [name]  Lack of Transportation (Non-Medical):   Physical Activity:   . Days of Exercise per Week:   . Minutes of Exercise per Session:   Stress:   . Feeling of Stress :   Social Connections:   . Frequency of Communication with Friends and Family:   . Frequency of Social Gatherings with Friends and Family:   . Attends Religious Services:   . Active Member of Clubs or Organizations:   . Attends Archivist Meetings:   [name] Marital Status:   Intimate Partner Violence:   . Fear of Current or Ex-Partner:   . Emotionally Abused:   [name] Physically Abused:   . Sexually Abused:     Outpatient Medications Prior to Visit  Medication Sig Dispense Refill  . albuterol (VENTOLIN HFA) 108 (90 Base) MCG/ACT inhaler Inhale 2 puffs into the lungs every 6 (six)  hours as needed for wheezing or shortness of breath. 18 g 12  . albuterol (VENTOLIN HFA) 108 (90 Base) MCG/ACT inhaler Inhale 2 puffs into the lungs every 6 (six) hours as needed. 18 g 0  . AMBULATORY NON FORMULARY MEDICATION Blood sugar testing strips and lancets for TrueTrack glucometer.  Use to check blood sugar up to two times a day.  Dx: Type 2 Diabetes 100 Units 4  . AMBULATORY NON FORMULARY MEDICATION Please provide needles for Tresiba pens 100 each 3  . amLODipine (NORVASC) 5 MG tablet Take 1 tablet (5 mg total) by mouth daily. 90 tablet 3  . aspirin 81 MG tablet Take 81 mg by mouth daily.    [name] atenolol (TENORMIN) 50 MG tablet Take 1 tablet (50 mg total) by mouth daily. 90 tablet 3  . doxepin (SINEQUAN) 25 MG capsule Take 2 capsules (50 mg total) by mouth at bedtime as needed. 180 capsule 3  . esomeprazole (NEXIUM) 20 MG capsule Take 20 mg by mouth daily at 12 noon.    . Flaxseed, Linseed, (FLAX SEED OIL) 1000 MG CAPS Take 1,000 mg by mouth 2 (two) times daily.    [name] glipiZIDE (GLUCOTROL XL) 10 MG 24 hr tablet TAKE 1 TABLET BY MOUTH ONCE DAILY WITH BREAKFAST/ NEEDS APPT 90 tablet 0  . guaiFENesin-codeine 100-10 MG/5ML syrup Take 5 mLs by mouth every 6 (six) hours as needed for cough. 118 mL 0  . hydrochlorothiazide (HYDRODIURIL) 25 MG tablet TAKE 1 TABLET BY MOUTH ONCE DAILY. 90 tablet 3  . insulin NPH Human (NOVOLIN N) 100 UNIT/ML injection Inject 0.1 mLs (10 Units total) into the skin at bedtime. Increase by 2 units every 5 days until fasting glucose is 90-130 or reach 40 units. 10 mL 11  . lisinopril (ZESTRIL) 5 MG tablet Take 1 tablet (5 mg total) by mouth daily. 90 tablet 3  . metFORMIN (GLUCOPHAGE) 1000 MG tablet Take 1 tablet (1,000 mg total) by mouth 2 (two) times daily with a meal. 180 tablet 3  . Omega-3 Fatty Acids (FISH OIL) 1000 MG CAPS Take 1,000 mg by mouth 2 (two) times daily.    . pioglitazone (ACTOS) 30 MG tablet Take 1 tablet (30 mg total) by mouth daily. 90 tablet 3  .  Probiotic Product (PROBIOTIC DAILY PO) Take by mouth daily.    . simvastatin (ZOCOR) 40 MG tablet Take 1 tablet (40 mg total) by mouth at bedtime. 90 tablet 3  . sitaGLIPtin (JANUVIA) 100 MG tablet Take 1 tablet (100 mg total) by mouth daily. 90 tablet 3  . triamcinolone cream (KENALOG) 0.1 % Apply 1 application topically  2 (two) times daily. 80 g 0  . liraglutide (VICTOZA) 18 MG/3ML SOPN Inject 0.2 mLs (1.2 mg total) into the skin daily. Please include needles. (Patient not taking: Reported on 04/24/2020) 9 mL 3  . azithromycin (ZITHROMAX) 250 MG tablet Take 2 tabs (500 mg) together on the first day, then 1 tab (250 mg) daily until prescription complete. 10 tablet 0  . Melatonin 5 MG CAPS Take by mouth at bedtime.     No facility-administered medications prior to visit.    Allergies  Allergen Reactions  . Gabapentin     Nauseated/feeling sick  . Invokana [Canagliflozin]     Vaginitis   . Lyrica [Pregabalin]     Pain in feet increased and spasm    ROS Review of Systems  Constitutional: Positive for fatigue. Negative for appetite change, chills, fever and unexpected weight change.  HENT: Positive for congestion and postnasal drip. Negative for sinus pressure and sinus pain.   Respiratory: Positive for cough and shortness of breath. Negative for chest tightness and wheezing.   Cardiovascular: Negative for chest pain, palpitations and leg swelling.  Gastrointestinal: Negative for diarrhea, nausea and vomiting.  Genitourinary: Negative for dysuria and urgency.  Allergic/Immunologic: Positive for environmental allergies.  Neurological: Negative for tremors, weakness and headaches.  Psychiatric/Behavioral: Positive for dysphoric mood. Negative for decreased concentration. The patient is nervous/anxious.       Objective:    Physical Exam  Constitutional: She is oriented to person, place, and time. She appears well-developed and well-nourished.  HENT:  Head: Normocephalic.  Eyes:  Pupils are equal, round, and reactive to light. Conjunctivae and EOM are normal.  Fundoscopic exam:      The right eye shows no AV nicking, no hemorrhage and no papilledema. The right eye shows red reflex.       The left eye shows no AV nicking, no hemorrhage and no papilledema. The left eye shows red reflex.  Neck: No JVD present. Carotid bruit is not present.  Cardiovascular: Normal rate, regular rhythm, normal heart sounds, intact distal pulses and normal pulses.  Pulmonary/Chest: Effort normal and breath sounds normal. No accessory muscle usage. No respiratory distress. She has no wheezes. She has no rales.  Abdominal: Soft. Bowel sounds are normal.  Musculoskeletal:        General: No edema. Normal range of motion.     Cervical back: Normal range of motion and neck supple.  Lymphadenopathy:    She has no cervical adenopathy.  Neurological: She is alert and oriented to person, place, and time. She has normal reflexes.  Skin: Skin is warm and dry.  Psychiatric: She has a normal mood and affect. Her behavior is normal. Judgment and thought content normal.  Nursing note and vitals reviewed.   BP (!) 139/58   Pulse 86   Ht 5\' 5"  (1.651 m)   Wt 252 lb (114.3 kg)   SpO2 97%   BMI 41.93 kg/m  Wt Readings from Last 3 Encounters:  04/24/20 252 lb (114.3 kg)  11/21/19 250 lb (113.4 kg)  04/13/19 252 lb (114.3 kg)     Health Maintenance Due  Topic Date Due  . HEMOGLOBIN A1C  02/21/2020    There are no preventive care reminders to display for this patient.  Lab Results  Component Value Date   TSH 1.697 02/28/2014   Lab Results  Component Value Date   WBC 6.3 02/28/2014   HGB 13.7 02/28/2014   HCT 40.9 02/28/2014   MCV 89.5 02/28/2014  PLT 329 02/28/2014   Lab Results  Component Value Date   NA 142 11/21/2019   K 4.5 11/21/2019   CO2 27 11/21/2019   GLUCOSE 93 11/21/2019   BUN 22 11/21/2019   CREATININE 0.89 11/21/2019   BILITOT 0.3 11/21/2019   ALKPHOS 85  08/26/2017   AST 11 11/21/2019   ALT 13 11/21/2019   PROT 6.6 11/21/2019   ALBUMIN 3.8 08/26/2017   CALCIUM 9.4 11/21/2019   Lab Results  Component Value Date   CHOL 167 11/21/2019   Lab Results  Component Value Date   HDL 60 11/21/2019   Lab Results  Component Value Date   LDLCALC 86 11/21/2019   Lab Results  Component Value Date   TRIG 115 11/21/2019   Lab Results  Component Value Date   CHOLHDL 2.8 11/21/2019   Lab Results  Component Value Date   HGBA1C 6.0 (A) 11/21/2019      Assessment & Plan:   1. Type 2 diabetes mellitus with hyperglycemia, without long-term current use of insulin ([place]) Ongoing type 2 diabetes with blood sugar spikes in the afternoon but fairly well controlled fasting blood glucose in the morning. The peers of the patient's blood sugar spikes are due to dietary issues most specifically with her Tadeusz Stahl morning meal.  We did discuss reducing the carbohydrates and avoiding biscuits and other bread products to help reduce these afternoon blood sugars.  We also discussed the option of a midmorning snack to help with satiety including celery with peanut butter which is one of her favorite snacks. Her hemoglobin A1c was slightly more elevated today than it was last however with the elimination of the liraglutide is not surprising. It would be best if we could restart her on the liraglutide and she was encouraged to notify us if any financial assistance returns for this medication. No medication changes today.  Plan is to continue to monitor fasting blood sugars and increase her bedtime NPH by 1 unit/day or 3 units every 3 days for fasting blood sugar levels greater than 140. We also discussed stress reduction to improve dietary choices. Patient to follow-up in approximately 3 months for hemoglobin A1c and medication management. - POCT glycosylated hemoglobin (Hb A1C)  2. Chronic obstructive pulmonary disease, unspecified COPD type ([place]) Patient recently  finished antibiotic and steroid burst for COPD exacerbation.  She still is experiencing some intermittent shortness of breath.  Her lungs is better today. We did discuss the option of an additional steroid burst to see if this would help eliminate some of her symptoms however due to her elevated blood sugar levels and side effects of the steroid she would like to avoid any steroids at this time. I do feel that inhaled corticosteroid would greatly benefit her and help her symptoms on a daily basis.  We will look into see if he can obtain some samples for her. Due to her seasonal allergy symptoms as well I suggested that she begin to take a daily antihistamine such as Zyrtec at bedtime to see if this helps with some of her symptoms and prevents the fatigue she has experienced in the past.  Encourage patient follow-up if symptoms worsen or fail to improve.   Orma Render, NP

## 2020-04-30 ENCOUNTER — Encounter: Payer: Self-pay | Admitting: Nurse Practitioner

## 2020-05-01 ENCOUNTER — Encounter: Payer: Self-pay | Admitting: Nurse Practitioner

## 2020-05-01 ENCOUNTER — Telehealth: Payer: Self-pay

## 2020-05-01 ENCOUNTER — Other Ambulatory Visit: Payer: Self-pay | Admitting: Nurse Practitioner

## 2020-05-01 MED ORDER — FLUCONAZOLE 150 MG PO TABS: 150.0000 mg | ORAL_TABLET | Freq: Every day | ORAL | 1 refills | Status: DC

## 2020-05-01 NOTE — Telephone Encounter (Signed)
Prescription sent to [facility] on file [address]).

## 2020-05-01 NOTE — Telephone Encounter (Signed)
Pt states she was told a Rx for Diflucan was going to be sent to her pharmacy but it has not been sent in yet. I do not know how you want to send the Rx in regarding dose and sig.

## 2020-05-02 ENCOUNTER — Encounter: Payer: Self-pay | Admitting: Nurse Practitioner

## 2020-05-02 ENCOUNTER — Other Ambulatory Visit: Payer: Self-pay | Admitting: Nurse Practitioner

## 2020-05-02 DIAGNOSIS — J439 Emphysema, unspecified: Secondary | ICD-10-CM

## 2020-05-02 MED ORDER — ADVAIR HFA 115-21 MCG/ACT IN AERO: 2.0000 | INHALATION_SPRAY | Freq: Two times a day (BID) | RESPIRATORY_TRACT | 2 refills | Status: DC

## 2020-05-02 NOTE — Telephone Encounter (Signed)
Pt aware Rx has been sent to the pharmacy. No further questions or concerns at this time.   

## 2020-05-29 ENCOUNTER — Other Ambulatory Visit: Payer: Self-pay | Admitting: Physician Assistant

## 2020-05-29 DIAGNOSIS — Z794 Long term (current) use of insulin: Secondary | ICD-10-CM

## 2020-07-17 ENCOUNTER — Ambulatory Visit: Payer: Self-pay | Admitting: Physician Assistant

## 2020-07-23 ENCOUNTER — Other Ambulatory Visit: Payer: Self-pay | Admitting: Neurology

## 2020-07-23 MED ORDER — INSULIN NPH (HUMAN) (ISOPHANE) 100 UNIT/ML ~~LOC~~ SUSP: 10.0000 [IU] | Freq: Every day | SUBCUTANEOUS | 11 refills | Status: DC

## 2020-07-24 ENCOUNTER — Other Ambulatory Visit: Payer: Self-pay | Admitting: Physician Assistant

## 2020-07-30 ENCOUNTER — Other Ambulatory Visit: Payer: Self-pay

## 2020-07-30 ENCOUNTER — Encounter: Payer: Self-pay | Admitting: Physician Assistant

## 2020-07-30 ENCOUNTER — Ambulatory Visit (INDEPENDENT_AMBULATORY_CARE_PROVIDER_SITE_OTHER): Payer: Self-pay | Admitting: Physician Assistant

## 2020-07-30 VITALS — BP 135/62 | HR 84 | Temp 97.9°F | Wt 259.2 lb

## 2020-07-30 DIAGNOSIS — E1165 Type 2 diabetes mellitus with hyperglycemia: Secondary | ICD-10-CM

## 2020-07-30 DIAGNOSIS — M5441 Lumbago with sciatica, right side: Secondary | ICD-10-CM

## 2020-07-30 DIAGNOSIS — G8929 Other chronic pain: Secondary | ICD-10-CM

## 2020-07-30 DIAGNOSIS — M4310 Spondylolisthesis, site unspecified: Secondary | ICD-10-CM

## 2020-07-30 DIAGNOSIS — M5136 Other intervertebral disc degeneration, lumbar region: Secondary | ICD-10-CM

## 2020-07-30 DIAGNOSIS — G629 Polyneuropathy, unspecified: Secondary | ICD-10-CM

## 2020-07-30 HISTORY — DX: Spondylolisthesis, site unspecified: M43.10

## 2020-07-30 LAB — POCT GLYCOSYLATED HEMOGLOBIN (HGB A1C): Hemoglobin A1C: 7 % — AB (ref 4.0–5.6)

## 2020-07-30 MED ORDER — DULOXETINE HCL 30 MG PO CPEP: 30.0000 mg | ORAL_CAPSULE | Freq: Every day | ORAL | 1 refills | Status: DC

## 2020-07-30 NOTE — Patient Instructions (Signed)
Tens unit Icy hot Stretches   Herniated Disk Rehab Ask your health care provider which exercises are safe for you. Do exercises exactly as told by your health care provider and adjust them as directed. It is normal to feel mild stretching, pulling, tightness, or discomfort as you do these exercises. Stop right away if you feel sudden pain or your pain gets worse. Do not begin these exercises until told by your health care provider. Stretching and range-of-motion exercises These exercises warm up your muscles and joints and improve the movement and flexibility of your back. These exercises also help to relieve pain, numbness, and tingling. Prone extension on elbows  1. Lie on your abdomen on a firm surface (prone position). 2. Prop yourself up on your elbows. 3. Use your arms to help lift your chest up until you feel a gentle stretch in your abdomen and your lower back. ? This will place some of your body weight on your elbows. If this is uncomfortable, try stacking pillows under your chest. ? Your hips should stay down, against the surface that you are lying on. Keep your hip and back muscles relaxed. 4. Hold this position for __________ seconds. 5. Slowly relax your upper body and return to the starting position. Repeat __________ times. Complete this exercise __________ times a day. Standing extension 1. Stand with your feet shoulder-width apart. 2. Place your hands on your lower back, with your palms on your back. 3. Gently arch your back (extension) and press forward with your hands. Keep your hips over your toes. Do not let your hips drift forward while arching your back. 4. Hold this position for __________ seconds. 5. Slowly return to the starting position. Repeat __________ times. Complete this exercise __________ times a day. Strengthening exercises These exercises build strength and endurance in your back. Endurance is the ability to use your muscles for a long time, even after  they get tired. Pelvic tilt 1. Lie on your back on a firm surface. Bend your knees and keep your feet flat. 2. Tense your abdominal muscles. Tip your pelvis up toward the ceiling and flatten your lower back into the floor. ? To help with this exercise, you may place a small towel under your lower back and try to push your back into the towel. 3. Hold this position for __________ seconds. 4. Let your muscles relax completely before you repeat this exercise. Repeat __________ times. Complete this exercise __________ times a day. Alternating arm and leg raises  1. Get on your hands and knees on a firm surface. If you are on a hard floor, you may want to use padding, such as an exercise mat, to cushion your knees. 2. Line up your arms and legs. Your hands should be below your shoulders, and your knees should be below your hips. 3. Lift your left leg behind you. At the same time, raise your right arm and straighten it in front of you. ? Do not lift your leg higher than your hip. ? Do not lift your arm higher than your shoulder. ? Keep your abdominal and back muscles tight. ? Keep your hips facing the ground. ? Do not arch your back. ? Keep your balance carefully, and do not hold your breath. 4. Hold this position for __________ seconds. 5. Slowly return to the starting position and repeat with your right leg and your left arm. Repeat __________ times. Complete this exercise __________ times a day. Bridge  1. Lie on your back on  a firm surface with your knees bent and your feet flat on the floor. 2. Tighten your abdominal and buttocks muscles and lift your bottom off the floor until your trunk and hips are level with your thighs. ? You should feel the muscles working in your buttocks and the back of your thighs. ? Do not arch your back. ? If this exercise is too easy, try doing it with your arms crossed over your chest. 3. Hold this position for __________ seconds. 4. Slowly lower your hips  to the starting position. 5. Let your muscles relax completely after each repetition. Repeat __________ times. Complete this exercise __________ times a day. This information is not intended to replace advice given to you by your health care provider. Make sure you discuss any questions you have with your health care provider. Document Revised: 04/07/2019 Document Reviewed: 10/13/2018 Elsevier Patient Education  [place].   Herniated Disk  A herniated disk happens when a disk in the spine bulges out too far. There is an oval disk between each pair of bones (vertebrae) in the backbone, or spine. The disks connect the bones, help the spine move, and keep the bones from rubbing against each other when you move. A herniated disk can happen anywhere in the back or neck area. It most often affects the lower back. What are the causes? This condition may be caused by:  Wear and tear as you age.  Sudden injury, such as a strain or sprain. What increases the risk? The following factors may make you more likely to develop this condition:  Aging. This is the main thing that raises your risk.  Being a man who is 61-44 years old.  Frequently doing activities that involve heavy lifting, bending, or twisting.  Not getting enough exercise.  Being overweight.  Using tobacco products. What are the signs or symptoms? Symptoms may vary depending on where your herniated disk is in your body.  A herniated disk in the lower back may cause sharp pain in: ? Part of the arm, leg, hip, or butt. ? The back of the lower leg (calf). ? The lower back, spreading down through the leg into the foot (sciatica).  A herniated disk in the neck may cause you to feel dizzy and feel that things around you are moving when they are not (vertigo). It may also cause pain or weakness in: ? The neck. ? The shoulder blades. ? The upper arm, lower arm, or fingers. You may also have muscle weakness. It may be  hard to:  Lift your leg or arm.  Stand on your toes.  Squeeze tightly with one of your hands. Other symptoms may include:  Loss of feeling (numbness) or tingling in the affected areas of the hands, arms, feet, or legs.  Being unable to control when you pee (urinate), or when you poop (have a bowel movement). This is rare but serious. You may have a very bad herniated disk in the lower back. How is this treated? This condition may be treated with:  A short period of rest. This is usually the first treatment. ? You may be on bed rest for up to 2 days, or you may be told to stay home and avoid being active. ? If you have a herniated disk in your lower back, limit how much you sit. Sitting puts more pressure on the disk.  Medicines. These may include: ? NSAIDs, such as ibuprofen, to help reduce pain and swelling. ? Muscle  relaxants so that the back muscles do not tighten all of a sudden (spasm). ? Prescription pain medicines, if you have very bad pain.  Ice or heat therapy.  Steroid shots (injections) in the area of the herniated disk. These can help reduce pain and swelling.  Physical therapy to strengthen your back muscles. In many cases, symptoms go away with treatment after days or weeks have passed. Your symptoms will most likely be gone after 3-4 months. If other treatments do not help, you may need surgery. Follow these instructions at home: Medicines  Take over-the-counter and prescription medicines only as told by your doctor.  Ask your doctor if the medicine prescribed to you: ? Requires you to avoid driving or using heavy machinery. ? Can cause trouble pooping (constipation). You may need to take these actions to prevent or treat trouble pooping:  Drink enough fluid to keep your pee (urine) pale yellow.  Take over-the-counter or prescription medicines.  Eat foods that are high in fiber. These include beans, whole grains, and fresh fruits and vegetables.  Limit  foods that are high in fat and processed sugars. These include fried or sweet foods. Managing pain, stiffness, and swelling      If told, put ice on the painful area. To do this: ? Put ice in a plastic bag. ? Place a towel between your skin and the bag. ? Leave the ice on for 20 minutes, 2-3 times a day.  If told, put heat on the painful area. Do this as often as told by your doctor. Use the heat source that your doctor recommends, such as a moist heat pack or a heating pad. ? Place a towel between your skin and the heat source. ? Leave the heat on for 20-30 minutes. ? Remove the heat if your skin turns bright red. This is very important if you are unable to feel pain, heat, or cold. You may have a greater risk of getting burned. Activity  Rest as told by your doctor.  After your rest period: ? Return to your normal activities as told by your doctor. Slowly start doing exercises as told. Ask your doctor what activities and exercises are safe for you. ? Use good posture. ? Avoid movements that cause pain. ? Do not lift anything that is heavier than 10 lb (4.5 kg), or the limit that you are told, until your doctor says that it is safe. ? Do not sit or stand for a long time without moving. ? Do not sit for a long time without getting up and moving around.  If exercises (physical therapy) were prescribed, do them as told by your doctor.  Try to strengthen your back and belly (abdomen) with exercises such as swimming or walking. General instructions  Do not use any products that contain nicotine or tobacco, such as cigarettes, e-cigarettes, and chewing tobacco. If you need help quitting, ask your doctor.  Do not wear high-heeled shoes.  Do not sleep on your belly.  If you are overweight, work with your doctor to lose weight safely.  Keep all follow-up visits as told by your doctor. This is important. How is this prevented?   Stay at a healthy weight.  Stay in shape. Do at  least 150 minutes of moderate-intensity exercise each week, such as fast walking or water aerobics.  When lifting objects: ? Keep your feet as far apart as your shoulders or farther apart. ? Tighten your belly muscles. ? Bend your knees and hips,  and keep your spine neutral. Lift using the strength of your legs, not your back. Do not lock your knees straight out. ? Always ask for help to lift heavy or awkward objects. Contact a doctor if:  You have back pain or neck pain that does not get better after 6 weeks.  You have very bad pain in your back, neck, legs, or arms.  You get any of these problems in any part of your body: ? Tingling. ? Weakness. ? Loss of feeling. Get help right away if:  You cannot move your arms or legs.  You cannot control when you pee or poop.  You feel dizzy.  You faint.  You have trouble breathing. Summary  A herniated disk happens when a disk in your backbone bulges out too far.  This condition may be caused by wear and tear as you age or a sudden injury.  Symptoms may vary depending on where your herniated disk is in your body.  Treatment may include rest, medicines, ice or heat therapy, steroid shots, and exercises.  You may need surgery if other treatments do not help. This information is not intended to replace advice given to you by your health care provider. Make sure you discuss any questions you have with your health care provider. Document Revised: 07/19/2019 Document Reviewed: 07/19/2019 Elsevier Patient Education  [place].

## 2020-07-30 NOTE — Progress Notes (Addendum)
Subjective:    Patient ID: Kathleen Le, female    DOB: 02-27-1961, 59 y.o.   MRN: [id number]  HPI  Pt is a 59 yo female with T2DM, chronic low back pain, lumbar DDD,  HTN who presents to the clinic for 3 month follow up.   Breathing doing well with flovent and proair as needed.   Pt has lumbar DDD and intermittent low back pain. At times pain radiates down right leg. Kathleen Le is helping her mother out and it is worsening her back pain. It is becoming too painful. Kathleen Le needs letter to give to family to let them know of her restrictions. Negative for any leg weakness, saddle anesthesia, or bladder or bowel dysfunction.    .. Active Ambulatory Problems    Diagnosis Date Noted  . Type II diabetes mellitus, uncontrolled ([place]) 02/03/2014  . Unspecified asthma(493.90) 02/03/2014  . Essential hypertension, benign 02/03/2014  . Neuropathy 02/05/2014  . IT band syndrome 02/05/2014  . Sinus tachycardia 02/05/2014  . Memory changes 02/05/2014  . Word finding difficulty 02/05/2014  . Cervical cancer ([place]) 02/05/2014  . Depression 05/19/2014  . Anxiety 05/19/2014  . Chronic bilateral low back pain with right-sided sciatica 05/19/2014  . DDD (degenerative disc disease), lumbar 05/19/2014  . PTSD (post-traumatic stress disorder) 09/26/2014  . Foot callus 10/03/2014  . Gastroesophageal reflux disease with esophagitis 01/10/2015  . Cerumen impaction 01/24/2015  . Hyperlipidemia 01/24/2015  . Pulmonary emphysema ([place]) 07/26/2012  . Morbid obesity ([place]) 08/31/2017  . Insomnia due to other mental disorder 10/27/2017  . History of substance abuse ([place]) 10/27/2017  . GAD (generalized anxiety disorder) 10/27/2017  . Lower leg edema 11/10/2017  . Current smoker 06/08/2018  . Diabetes mellitus, type II, insulin dependent ([place]) 06/08/2018  . Claustrophobia 06/08/2018  . Primary insomnia 06/08/2018  . Hot flashes 06/08/2018  . Abnormal weight gain 06/08/2018  . Acute stress reaction 09/12/2018  .  Anterolisthesis 07/30/2020   Resolved Ambulatory Problems    Diagnosis Date Noted  . Bruised rib 09/26/2014  . Bruised ribs 09/26/2014  . Wound of right leg 09/26/2014  . Essential hypertension 11/10/2017  . Former smoker 03/09/2018   Past Medical History:  Diagnosis Date  . Asthma   . Diabetes mellitus without complication ([place])   . History of alcohol abuse   . History of marijuana use   . Hypertension   . Substance abuse ([place])      Review of Systems  All other systems reviewed and are negative.      Objective:   Physical Exam Vitals reviewed.  Constitutional:      Appearance: Normal appearance.  HENT:     Head: Normocephalic.  Cardiovascular:     Rate and Rhythm: Normal rate and regular rhythm.     Pulses: Normal pulses.  Pulmonary:     Effort: Pulmonary effort is normal.     Breath sounds: Normal breath sounds.  Musculoskeletal:     Comments: Bilateral lower ext strength 5/5.  Negative straight leg raise.  No tenderness over lumbar spine to palpation.  Tightness and discomfort over lower spine to palpation.   Neurological:     General: No focal deficit present.     Mental Status: Kathleen Le is alert and oriented to person, place, and time.  Psychiatric:        Mood and Affect: Mood normal.           Assessment & Plan:  [name] KitchenJulliana was seen today for diabetes.  Diagnoses and all  orders for this visit:  Chronic bilateral low back pain with right-sided sciatica -     DG Lumbar Spine Complete -     DULoxetine (CYMBALTA) 30 MG capsule; Take 1 capsule (30 mg total) by mouth daily.  Uncontrolled type 2 diabetes mellitus with hyperglycemia (HCC) -     POCT glycosylated hemoglobin (Hb A1C)  Neuropathy -     DULoxetine (CYMBALTA) 30 MG capsule; Take 1 capsule (30 mg total) by mouth daily.  DDD (degenerative disc disease), lumbar -     DULoxetine (CYMBALTA) 30 MG capsule; Take 1 capsule (30 mg total) by mouth daily.  Anterolisthesis    Results for orders  placed or performed in visit on 07/30/20  POCT glycosylated hemoglobin (Hb A1C)  Result Value Ref Range   Hemoglobin A1C 7.0 (A) 4.0 - 5.6 %   HbA1c POC (<> result, manual entry)     HbA1c, POC (prediabetic range)     HbA1c, POC (controlled diabetic range)     Refilled medications.  Stay on same medications.  Encouraged diet changes.  On ACE. On STATin.  Needs eye exam.  Pneumonia vaccine done.   Discussed low back pain. Repeat lumbar xray. Discussed exercises, icy hot, tens unit. Continue celebrex. Added cymbalta. Consider appt with Dr. Darene Lamer. Pt should not lift mother and provide a lot of care that uses pushing, lifting, pulling.   Follow up in 3 months.

## 2020-08-01 ENCOUNTER — Encounter: Payer: Self-pay | Admitting: Physician Assistant

## 2020-08-07 ENCOUNTER — Encounter: Payer: Self-pay | Admitting: Physician Assistant

## 2020-08-14 ENCOUNTER — Other Ambulatory Visit: Payer: Self-pay | Admitting: Physician Assistant

## 2020-08-14 ENCOUNTER — Encounter: Payer: Self-pay | Admitting: Physician Assistant

## 2020-08-14 DIAGNOSIS — Z79899 Other long term (current) drug therapy: Secondary | ICD-10-CM

## 2020-08-14 DIAGNOSIS — E119 Type 2 diabetes mellitus without complications: Secondary | ICD-10-CM

## 2020-08-14 DIAGNOSIS — Z794 Long term (current) use of insulin: Secondary | ICD-10-CM

## 2020-08-14 MED ORDER — ALBUTEROL SULFATE (2.5 MG/3ML) 0.083% IN NEBU
2.5000 mg | INHALATION_SOLUTION | RESPIRATORY_TRACT | 1 refills | Status: DC | PRN
Start: 2020-08-14 — End: 2020-10-01

## 2020-08-15 ENCOUNTER — Encounter: Payer: Self-pay | Admitting: Physician Assistant

## 2020-08-16 ENCOUNTER — Encounter: Payer: Self-pay | Admitting: Physician Assistant

## 2020-08-24 ENCOUNTER — Other Ambulatory Visit: Payer: Self-pay | Admitting: Nurse Practitioner

## 2020-08-24 DIAGNOSIS — E119 Type 2 diabetes mellitus without complications: Secondary | ICD-10-CM

## 2020-09-28 ENCOUNTER — Other Ambulatory Visit: Payer: Self-pay | Admitting: Physician Assistant

## 2020-09-28 DIAGNOSIS — G8929 Other chronic pain: Secondary | ICD-10-CM

## 2020-09-28 DIAGNOSIS — G629 Polyneuropathy, unspecified: Secondary | ICD-10-CM

## 2020-09-28 DIAGNOSIS — M5136 Other intervertebral disc degeneration, lumbar region: Secondary | ICD-10-CM

## 2020-10-01 ENCOUNTER — Other Ambulatory Visit: Payer: Self-pay | Admitting: Physician Assistant

## 2020-10-01 ENCOUNTER — Encounter: Payer: Self-pay | Admitting: Physician Assistant

## 2020-10-01 MED ORDER — ALBUTEROL SULFATE (2.5 MG/3ML) 0.083% IN NEBU: INHALATION_SOLUTION | RESPIRATORY_TRACT | 5 refills | Status: DC

## 2020-10-30 ENCOUNTER — Encounter: Payer: Self-pay | Admitting: Physician Assistant

## 2020-10-30 ENCOUNTER — Ambulatory Visit (INDEPENDENT_AMBULATORY_CARE_PROVIDER_SITE_OTHER): Payer: PRIVATE HEALTH INSURANCE | Admitting: Physician Assistant

## 2020-10-30 VITALS — BP 142/70 | HR 72 | Ht 65.0 in | Wt 265.0 lb

## 2020-10-30 DIAGNOSIS — J432 Centrilobular emphysema: Secondary | ICD-10-CM

## 2020-10-30 DIAGNOSIS — I10 Essential (primary) hypertension: Secondary | ICD-10-CM

## 2020-10-30 DIAGNOSIS — E1165 Type 2 diabetes mellitus with hyperglycemia: Secondary | ICD-10-CM

## 2020-10-30 DIAGNOSIS — M5441 Lumbago with sciatica, right side: Secondary | ICD-10-CM | POA: Diagnosis not present

## 2020-10-30 DIAGNOSIS — G629 Polyneuropathy, unspecified: Secondary | ICD-10-CM

## 2020-10-30 DIAGNOSIS — M4310 Spondylolisthesis, site unspecified: Secondary | ICD-10-CM

## 2020-10-30 DIAGNOSIS — E119 Type 2 diabetes mellitus without complications: Secondary | ICD-10-CM

## 2020-10-30 DIAGNOSIS — G478 Other sleep disorders: Secondary | ICD-10-CM

## 2020-10-30 DIAGNOSIS — Z1231 Encounter for screening mammogram for malignant neoplasm of breast: Secondary | ICD-10-CM

## 2020-10-30 DIAGNOSIS — F17200 Nicotine dependence, unspecified, uncomplicated: Secondary | ICD-10-CM

## 2020-10-30 DIAGNOSIS — E785 Hyperlipidemia, unspecified: Secondary | ICD-10-CM

## 2020-10-30 DIAGNOSIS — Z794 Long term (current) use of insulin: Secondary | ICD-10-CM

## 2020-10-30 DIAGNOSIS — E1169 Type 2 diabetes mellitus with other specified complication: Secondary | ICD-10-CM

## 2020-10-30 DIAGNOSIS — R0683 Snoring: Secondary | ICD-10-CM

## 2020-10-30 DIAGNOSIS — M5136 Other intervertebral disc degeneration, lumbar region: Secondary | ICD-10-CM

## 2020-10-30 DIAGNOSIS — G8929 Other chronic pain: Secondary | ICD-10-CM

## 2020-10-30 LAB — POCT GLYCOSYLATED HEMOGLOBIN (HGB A1C): Hemoglobin A1C: 7.2 % — AB (ref 4.0–5.6)

## 2020-10-30 MED ORDER — MELOXICAM 15 MG PO TABS: 15.0000 mg | ORAL_TABLET | Freq: Every day | ORAL | 2 refills | Status: DC

## 2020-10-30 MED ORDER — TRELEGY ELLIPTA 100-62.5-25 MCG/INH IN AEPB: 1.0000 | INHALATION_SPRAY | Freq: Every day | RESPIRATORY_TRACT | 5 refills | Status: DC

## 2020-10-30 MED ORDER — DULOXETINE HCL 30 MG PO CPEP: 30.0000 mg | ORAL_CAPSULE | Freq: Two times a day (BID) | ORAL | 1 refills | Status: DC

## 2020-10-30 MED ORDER — OZEMPIC (0.25 OR 0.5 MG/DOSE) 2 MG/1.5ML ~~LOC~~ SOPN: 0.5000 mg | PEN_INJECTOR | SUBCUTANEOUS | 2 refills | Status: DC

## 2020-10-30 NOTE — Patient Instructions (Addendum)
Will get sleep study Will get lung cancer screening Get in with Dr. Darene Lamer for chronic right sided leg pain.  Stop glipizide and januvia.  Start trelegy.  Start ozempic weekly.  Restart doxepin.  Start mobic for right low back and leg pain. Follow up with Dr. Darene Lamer in 2 weeks.

## 2020-10-30 NOTE — Progress Notes (Signed)
Subjective:    Patient ID: Kathleen Le, female    DOB: September 16, 1961, 59 y.o.   MRN: [id number]  HPI  Pt is a 59 yo obese female with HTN, COPD, T2DM, chronic low back pain due to lumbar DDD and anterolithesis, neuropathy, insomnia, GAD, Depression who presents to the clinic after getting insurance hoping to get better control over health.   She has been cutting back on smoking. She is down from 1 carton a week to 1 pack of cigarettes a week. She has been smoking 1-2 packs a day for over 40 years. She has been eating more and now gaining weight.   She is checking her sugars in the morning and running 90-110. She is on novolin 16 units, januvia, glipizide, metformin, actos. This is what she has been able to afford. She is not exercising. No open sores or wounds. She has had a few low BS readings in the morning that have resolved quickly with food.   She had been taking doxepin for sleep. She stopped because she did not want to be on cymbalta and doxepin. cymbaltal was for neuropathy and chronic pain and mood. She does feel like it help. She is considering doxepin again for sleep. She is having some RLS symptoms now too.   Continues to have right sided all the way down pain. Prednisone helps and then comes right back. No saddle anesthesia, bladder or bowel dysfunction, leg weakness.    HTN- not checking BP at home. No CP, palpitations, headaches or vision changes.   Mood is good. No SI/HC. She does feel like cymbalta is helping with her mood.   .. 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 05/19/2014   DDD (degenerative disc disease), lumbar 05/19/2014   PTSD (post-traumatic stress disorder) 09/26/2014   Foot callus 10/03/2014   Gastroesophageal reflux disease with esophagitis 01/10/2015   Cerumen impaction 01/24/2015   Hyperlipidemia 01/24/2015   Pulmonary emphysema ([place]) 07/26/2012   Morbid obesity ([place]) 08/31/2017   Insomnia due to other mental disorder 10/27/2017   History of substance abuse ([place]) 10/27/2017   GAD (generalized anxiety disorder) 10/27/2017   Lower leg edema 11/10/2017   Current smoker 06/08/2018   Diabetes mellitus, type II, insulin dependent ([place]) 06/08/2018   Claustrophobia 06/08/2018   Primary insomnia 06/08/2018   Hot flashes 06/08/2018   Abnormal weight gain 06/08/2018   Acute stress reaction 09/12/2018   Anterolisthesis 07/30/2020   Resolved Ambulatory Problems    Diagnosis Date Noted   Bruised rib 09/26/2014   Bruised ribs 09/26/2014   Wound of right leg 09/26/2014   Essential hypertension 11/10/2017   Former smoker 03/09/2018   Past Medical History:  Diagnosis Date   Asthma    Diabetes mellitus without complication ([place])    History of alcohol abuse    History of marijuana use    Hypertension    Substance abuse ([place])          Review of Systems  All other systems reviewed and are negative.      Objective:   Physical Exam Vitals  reviewed.  Constitutional:      Appearance: Normal appearance. She is obese.  Neck:     Vascular: No carotid bruit.  Cardiovascular:     Rate and Rhythm: Normal rate and regular rhythm.     Pulses: Normal pulses.  Pulmonary:     Effort: Pulmonary effort is normal.  Abdominal:     Palpations: Abdomen is soft.  Musculoskeletal:     Right lower leg: No edema.     Left lower leg: No edema.     Comments: Limited ROM due to low back pain.   Neurological:     General: No focal deficit present.     Mental  Status: She is alert and oriented to person, place, and time.  Psychiatric:        Mood and Affect: Mood normal.        Behavior: Behavior normal.       .. Results for orders placed or performed in visit on 10/30/20  Lipid Panel w/reflex Direct LDL  Result Value Ref Range   Cholesterol 178 <200 mg/dL   HDL 61 > OR = 50 mg/dL   Triglycerides 143 <150 mg/dL   LDL Cholesterol (Calc) 93 mg/dL (calc)   Total CHOL/HDL Ratio 2.9 <5.0 (calc)   Non-HDL Cholesterol (Calc) 117 <130 mg/dL (calc)  COMPLETE METABOLIC PANEL WITH GFR  Result Value Ref Range   Glucose, Bld 136 (H) 65 - 99 mg/dL   BUN 19 7 - 25 mg/dL   Creat 0.82 0.50 - 1.05 mg/dL   GFR, Est Non African American 79 > OR = 60 mL/min/1.52m2   GFR, Est African American 92 > OR = 60 mL/min/1.26m2   BUN/Creatinine Ratio NOT APPLICABLE 6 - 22 (calc)   Sodium 140 135 - 146 mmol/L   Potassium 4.1 3.5 - 5.3 mmol/L   Chloride 101 98 - 110 mmol/L   CO2 30 20 - 32 mmol/L   Calcium 9.3 8.6 - 10.4 mg/dL   Total Protein 6.5 6.1 - 8.1 g/dL   Albumin 4.0 3.6 - 5.1 g/dL   Globulin 2.5 1.9 - 3.7 g/dL (calc)   AG Ratio 1.6 1.0 - 2.5 (calc)   Total Bilirubin 0.3 0.2 - 1.2 mg/dL   Alkaline phosphatase (APISO) 71 37 - 153 U/L   AST 13 10 - 35 U/L   ALT 13 6 - 29 U/L  TSH  Result Value Ref Range   TSH 1.25 0.40 - 4.50 mIU/L  CBC  Result Value Ref Range   WBC 5.7 3.8 - 10.8 Thousand/uL   RBC 4.51 3.80 - 5.10 Million/uL   Hemoglobin 13.0 11.7 - 15.5 g/dL   HCT 40.2 35 - 45 %   MCV 89.1 80.0 - 100.0 fL   MCH 28.8 27.0 - 33.0 pg   MCHC 32.3 32.0 - 36.0 g/dL   RDW 13.3 11.0 - 15.0 %   Platelets 274 140 - 400 Thousand/uL   MPV 9.7 7.5 - 12.5 fL  POCT glycosylated hemoglobin (Hb A1C)  Result Value Ref Range   Hemoglobin A1C 7.2 (A) 4.0 - 5.6 %   HbA1c POC (<> result, manual entry)     HbA1c, POC (prediabetic range)     HbA1c, POC (controlled diabetic range)     .[name] Depression screen [facility] 2/9 10/30/2020 04/24/2020 11/21/2019 09/08/2018  06/08/2018  Decreased Interest 1 1 1 1 1   Down, Depressed, Hopeless 1 0 1 1 1   PHQ - 2 Score 2 1 2 2 2   Altered sleeping  2 2 0 1 2  Tired, decreased energy 2 1 1 1 1   Change in appetite 1 1 1 1 1   Feeling bad or failure about yourself  1 1 0 0 0  Trouble concentrating 0 0 0 0 0  Moving slowly or fidgety/restless 1 1 1 1  0  Suicidal thoughts 0 0 0 0 0  PHQ-9 Score 9 7 5 6 6   Difficult doing work/chores Somewhat difficult Somewhat difficult Not difficult at all Not difficult at all Somewhat difficult   .[name] GAD 7 : Generalized Anxiety Score 10/30/2020 04/24/2020 11/21/2019 09/08/2018  Nervous, Anxious, on Edge 0 2 1 1   Control/stop worrying 1 1 3 1   Worry too much - different things 1 1 2 1   Trouble relaxing 1 1 2 1   Restless 1 0 1 1  Easily annoyed or irritable 1 1 1 1   Afraid - awful might happen 0 0 0 0  Total GAD 7 Score 5 6 10 6   Anxiety Difficulty - Somewhat difficult Not difficult at all Not difficult at all   '    Assessment & Plan:  [name] KitchenKashari was seen today for diabetes.  Diagnoses and all orders for this visit:  Uncontrolled type 2 diabetes mellitus with hyperglycemia ([place]) -     POCT glycosylated hemoglobin (Hb A1C) -     Semaglutide,0.25 or 0.5MG /DOS, (OZEMPIC, 0.25 OR 0.5 MG/DOSE,) 2 MG/1.5ML SOPN; Inject 0.5 mg into the skin once a week. -     COMPLETE METABOLIC PANEL WITH GFR -     CBC  Encounter for screening mammogram for malignant neoplasm of breast -     MM 3D SCREEN BREAST BILATERAL  Morbid obesity ([place]) -     TSH -     CBC  Chronic bilateral low back pain with right-sided sciatica -     meloxicam (MOBIC) 15 MG tablet; Take 1 tablet (15 mg total) by mouth daily. -     DULoxetine (CYMBALTA) 30 MG capsule; Take 1 capsule (30 mg total) by mouth 2 (two) times daily.  Essential hypertension, benign  Centrilobular emphysema ([place]) -     Fluticasone-Umeclidin-Vilant (TRELEGY ELLIPTA) 100-62.5-25 MCG/INH AEPB; Inhale 1 puff into the lungs daily. -      CBC  Diabetes mellitus, type II, insulin dependent ([facility]) -     CBC  Anterolisthesis -     meloxicam (MOBIC) 15 MG tablet; Take 1 tablet (15 mg total) by mouth daily.  DDD (degenerative disc disease), lumbar -     DULoxetine (CYMBALTA) 30 MG capsule; Take 1 capsule (30 mg total) by mouth 2 (two) times daily.  Hyperlipidemia associated with type 2 diabetes mellitus ([place]) -     Lipid Panel w/reflex Direct LDL -     CBC  Neuropathy -     DULoxetine (CYMBALTA) 30 MG capsule; Take 1 capsule (30 mg total) by mouth 2 (two) times daily.  Current smoker   Pt just got insurance so we are going to be getting up to date and helping get her healthy over the next few months.   BP not to goal. Will hold of BP adjustments at this time.   A1C 7.2 not terrible but not to goal.  Discussed DM diet.   I would like to get her on GLP-1 ozempic.  Sent to pharmacy.  Will stop [place] and glipizide once start ozempic.  Goal is to get off insulin.  On statin.   Suspect sleep apnea. Ordered a sleep study. Ok to  restart doxepin. Could help with RLS as well. If not follow up sooner.   COPD- congrats on working some smoking cessation. Need lung cancer screening. Having problems breathing. Start trelegy daily.   Needs to see Dr. Darene Lamer for chronic right sided back pain and radiation into legs. Xray done and shows DDD. Start mobic. Discussed exercises and conservative management. No red flags today.   Follow up in 3 months or sooner.   Spent 40 minutes with patient discussing health. This will take some time but stick with it and make the changes and keep the follow ups and hopefully we can get you healthier.

## 2020-10-31 ENCOUNTER — Other Ambulatory Visit: Payer: Self-pay | Admitting: Neurology

## 2020-10-31 ENCOUNTER — Other Ambulatory Visit: Payer: Self-pay | Admitting: Physician Assistant

## 2020-10-31 LAB — COMPLETE METABOLIC PANEL WITH GFR
AG Ratio: 1.6 (calc) (ref 1.0–2.5)
ALT: 13 U/L (ref 6–29)
AST: 13 U/L (ref 10–35)
Albumin: 4 g/dL (ref 3.6–5.1)
Alkaline phosphatase (APISO): 71 U/L (ref 37–153)
BUN: 19 mg/dL (ref 7–25)
CO2: 30 mmol/L (ref 20–32)
Calcium: 9.3 mg/dL (ref 8.6–10.4)
Chloride: 101 mmol/L (ref 98–110)
Creat: 0.82 mg/dL (ref 0.50–1.05)
GFR, Est African American: 92 mL/min/{1.73_m2} (ref >=60)
GFR, Est Non African American: 79 mL/min/{1.73_m2} (ref >=60)
Globulin: 2.5 g/dL (calc) (ref 1.9–3.7)
Glucose, Bld: 136 mg/dL — ABNORMAL HIGH (ref 65–99)
Potassium: 4.1 mmol/L (ref 3.5–5.3)
Sodium: 140 mmol/L (ref 135–146)
Total Bilirubin: 0.3 mg/dL (ref 0.2–1.2)
Total Protein: 6.5 g/dL (ref 6.1–8.1)

## 2020-10-31 LAB — CBC
HCT: 40.2 % (ref 35.0–45.0)
Hemoglobin: 13 g/dL (ref 11.7–15.5)
MCH: 28.8 pg (ref 27.0–33.0)
MCHC: 32.3 g/dL (ref 32.0–36.0)
MCV: 89.1 fL (ref 80.0–100.0)
MPV: 9.7 fL (ref 7.5–12.5)
Platelets: 274 10*3/uL (ref 140–400)
RBC: 4.51 10*6/uL (ref 3.80–5.10)
RDW: 13.3 % (ref 11.0–15.0)
WBC: 5.7 10*3/uL (ref 3.8–10.8)

## 2020-10-31 LAB — LIPID PANEL W/REFLEX DIRECT LDL
Cholesterol: 178 mg/dL (ref <200)
HDL: 61 mg/dL (ref >=50)
LDL Cholesterol (Calc): 93 mg/dL (calc)
Non-HDL Cholesterol (Calc): 117 mg/dL (calc) (ref <130)
Total CHOL/HDL Ratio: 2.9 (calc) (ref <5.0)
Triglycerides: 143 mg/dL (ref <150)

## 2020-10-31 LAB — TSH: TSH: 1.25 mIU/L (ref 0.40–4.50)

## 2020-10-31 MED ORDER — ROSUVASTATIN CALCIUM 20 MG PO TABS: 20.0000 mg | ORAL_TABLET | Freq: Every day | ORAL | 3 refills | Status: DC

## 2020-10-31 NOTE — Progress Notes (Signed)
Cason,   Glucose is up some on random check. DM plan stays the same.  Kidney and liver look great.  Thyroid looks great.  CBC looks great. No anemia.  LDL goal for DM is under 70. Would you be ok with switching you to a stronger statin like lipitor or crestor?

## 2020-10-31 NOTE — Progress Notes (Signed)
Done

## 2020-11-02 NOTE — Telephone Encounter (Signed)
Call pharmacy. Is there any GLP-1 that her insurance will cover. ozempic was 800 dollars with coupon??

## 2020-11-05 DIAGNOSIS — E785 Hyperlipidemia, unspecified: Secondary | ICD-10-CM | POA: Insufficient documentation

## 2020-11-05 DIAGNOSIS — E1169 Type 2 diabetes mellitus with other specified complication: Secondary | ICD-10-CM | POA: Insufficient documentation

## 2020-11-05 DIAGNOSIS — R0683 Snoring: Secondary | ICD-10-CM | POA: Insufficient documentation

## 2020-11-05 DIAGNOSIS — G478 Other sleep disorders: Secondary | ICD-10-CM

## 2020-11-05 HISTORY — DX: Snoring: R06.83

## 2020-11-05 HISTORY — DX: Type 2 diabetes mellitus with other specified complication: E11.69

## 2020-11-05 HISTORY — DX: Other sleep disorders: G47.8

## 2020-11-05 MED ORDER — TRULICITY 0.75 MG/0.5ML ~~LOC~~ SOAJ: 0.7500 mg | SUBCUTANEOUS | 2 refills | Status: DC

## 2020-11-05 NOTE — Telephone Encounter (Signed)
I called pharmacy, they can not run anything without a prescription so they can't tell me what would be covered.   I called her insurance company who state Ozempic is a covered drug, but brand names are going to be more expensive. They state that the way the formulary is broken down it does not go by conditions so without names of medications they can not look it up to tell me what would be the cheapest option. Please advise.

## 2020-11-06 NOTE — Telephone Encounter (Signed)
Can we call drug rep for trulicity or ozempic and see why commerical insurance is not covering these drugs.

## 2020-11-06 NOTE — Telephone Encounter (Signed)
I'm not sure that a drug rep would be able to help, it seems like insurance just isn't having good RX coverage. I personally called the insurance company to ask about diabetes medications and they told me that they can not look based on diagnosis and would have to give them every single RX name for them to look it up, so I don't want to put it back on the patient to contact her insurance. I am not sure what to do with this. Kathleen Le, please help.

## 2020-11-07 ENCOUNTER — Ambulatory Visit (INDEPENDENT_AMBULATORY_CARE_PROVIDER_SITE_OTHER): Payer: PRIVATE HEALTH INSURANCE

## 2020-11-07 ENCOUNTER — Other Ambulatory Visit: Payer: Self-pay

## 2020-11-07 DIAGNOSIS — Z1231 Encounter for screening mammogram for malignant neoplasm of breast: Secondary | ICD-10-CM

## 2020-11-07 IMAGING — MG DIGITAL SCREENING BILAT W/ TOMO W/ CAD
6 of 12 series · 6 of 36 positions shown · non-contrast
Comparison: Previous exam(s).

CLINICAL DATA: Screening.

EXAM:
DIGITAL SCREENING BILATERAL MAMMOGRAM WITH TOMO AND CAD

[R CC synth-2D]
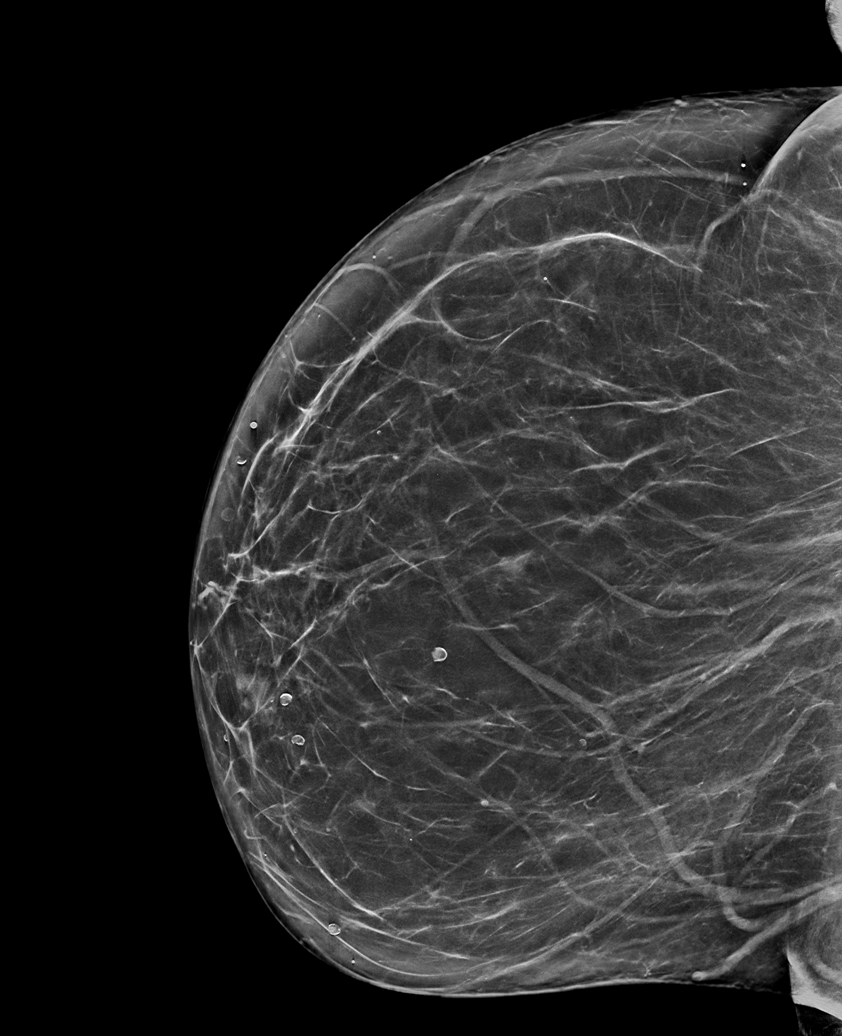

[R MLO synth-2D]
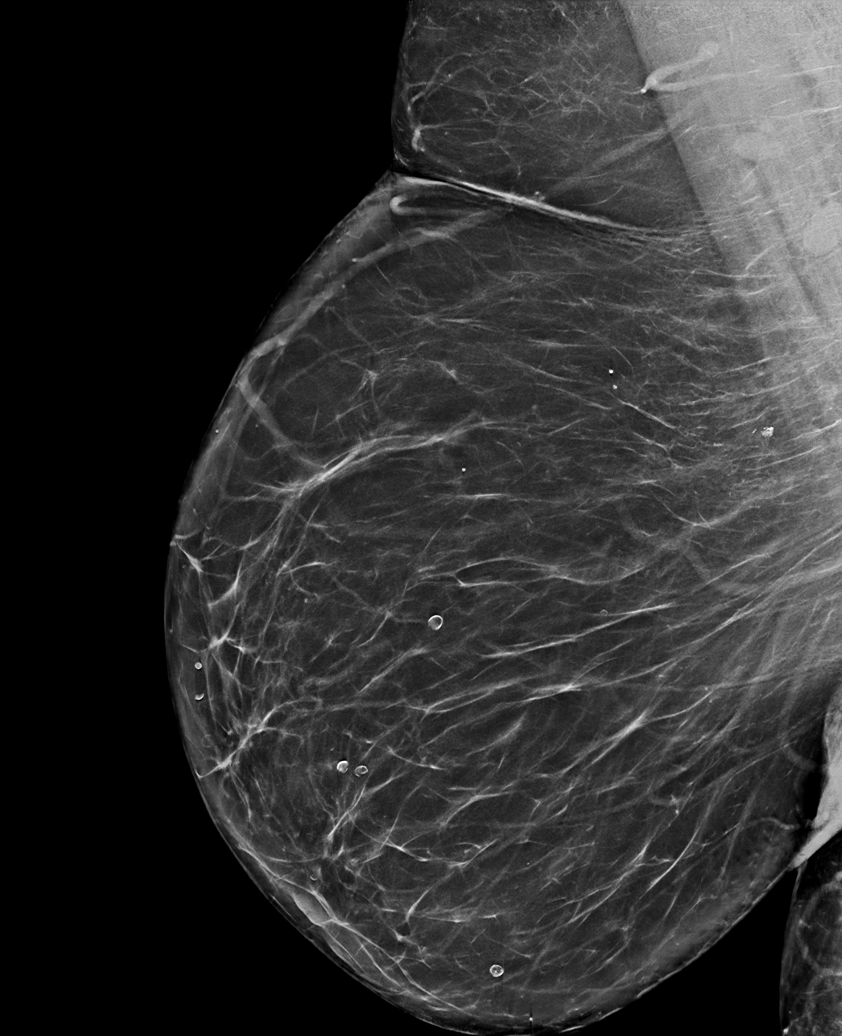

[R XCCL synth-2D]
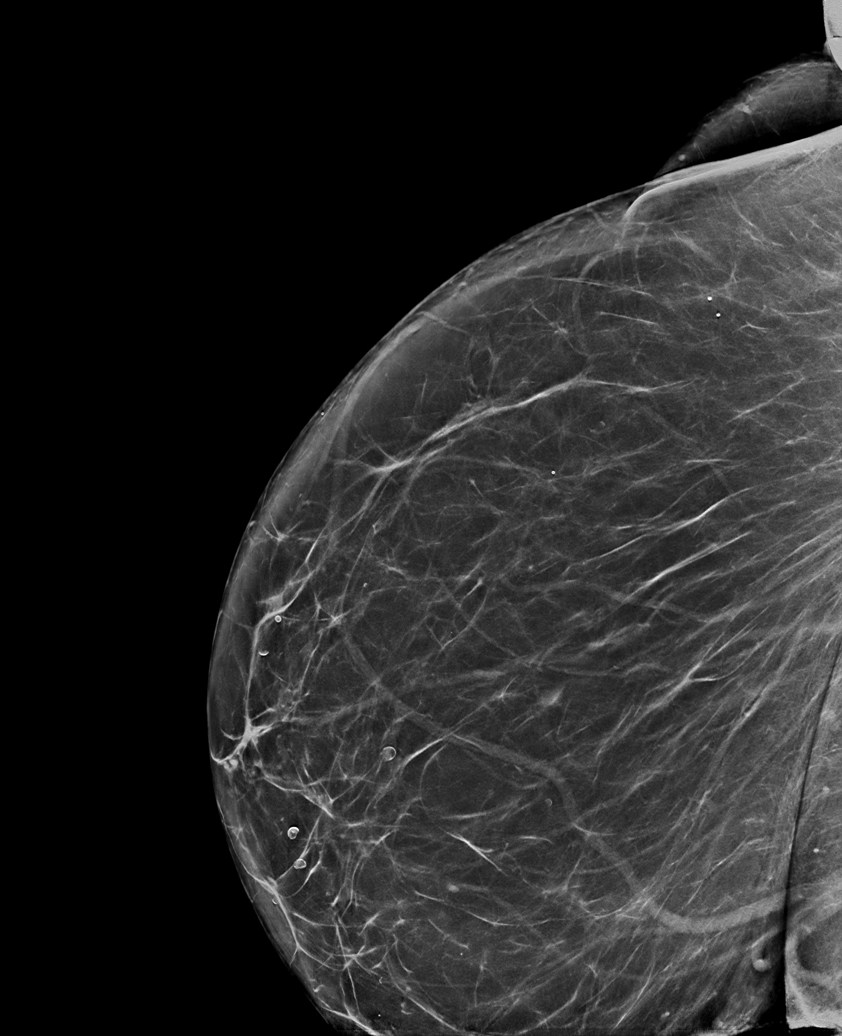

[L CC synth-2D]
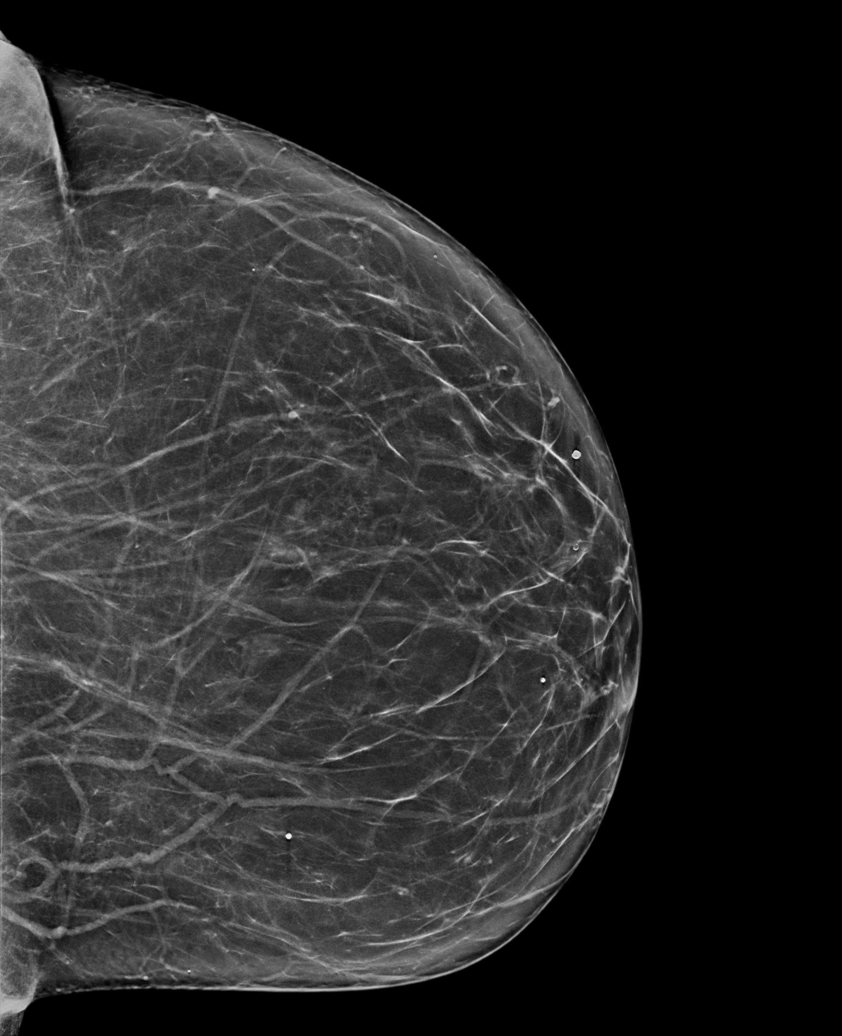

[L MLO synth-2D]
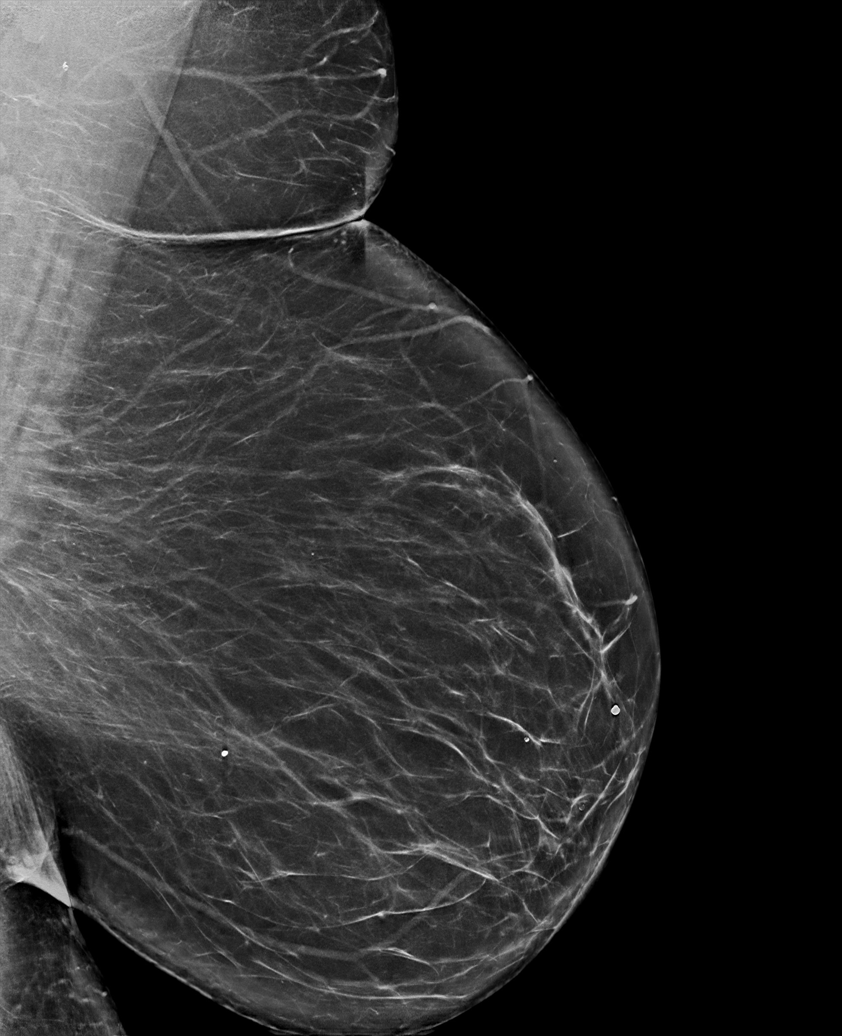

[L XCCL synth-2D]
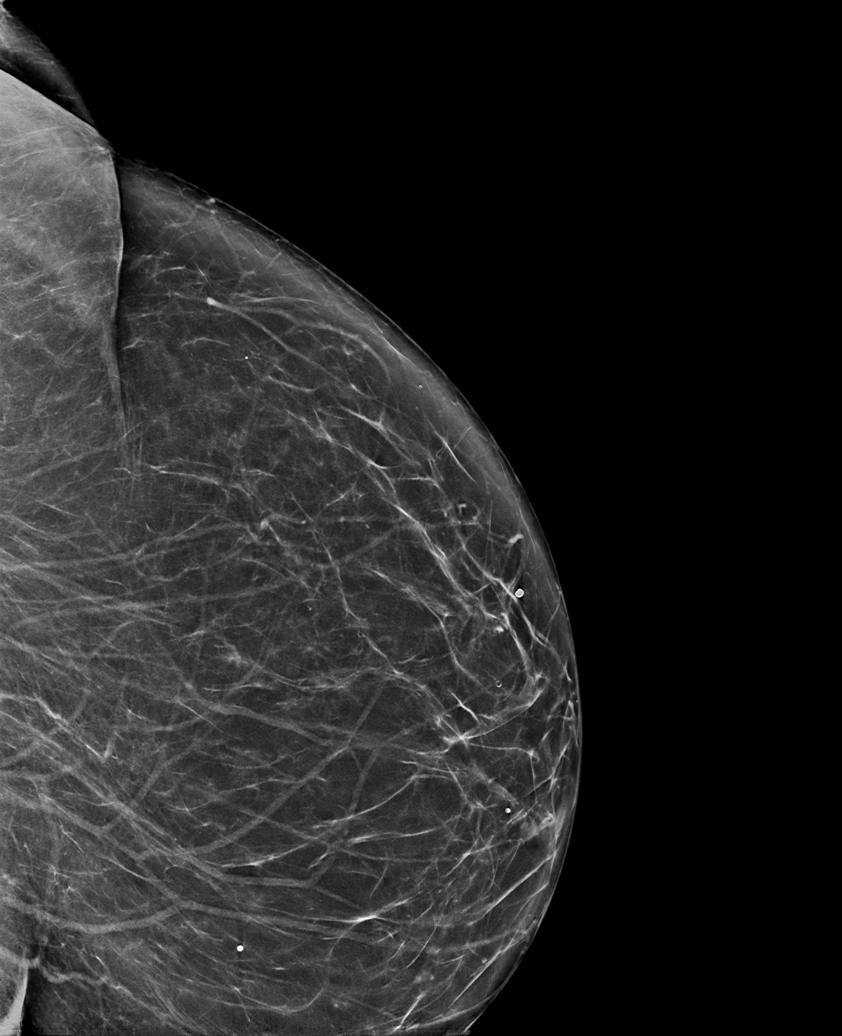

[6 of 36 positions shown; findings below may reference images not displayed]

ACR Breast Density Category b: There are scattered areas of
fibroglandular density.
FINDINGS: There are no findings suspicious for malignancy. Images were
processed with CAD.
IMPRESSION: No mammographic evidence of malignancy. A result letter of this
screening mammogram will be mailed directly to the patient.

RECOMMENDATION:
Screening mammogram in one year. (Code:[TQ])

BI-RADS CATEGORY  1: Negative.

## 2020-11-09 NOTE — Progress Notes (Signed)
Normal mammo. Follow up in one year.

## 2020-11-12 ENCOUNTER — Encounter: Payer: Self-pay | Admitting: Physician Assistant

## 2020-11-13 ENCOUNTER — Ambulatory Visit (INDEPENDENT_AMBULATORY_CARE_PROVIDER_SITE_OTHER): Payer: PRIVATE HEALTH INSURANCE | Admitting: Sports Medicine

## 2020-11-13 ENCOUNTER — Telehealth: Payer: Self-pay

## 2020-11-13 ENCOUNTER — Ambulatory Visit (INDEPENDENT_AMBULATORY_CARE_PROVIDER_SITE_OTHER): Payer: PRIVATE HEALTH INSURANCE

## 2020-11-13 ENCOUNTER — Other Ambulatory Visit: Payer: Self-pay

## 2020-11-13 DIAGNOSIS — M5136 Other intervertebral disc degeneration, lumbar region: Secondary | ICD-10-CM | POA: Diagnosis not present

## 2020-11-13 DIAGNOSIS — M4317 Spondylolisthesis, lumbosacral region: Secondary | ICD-10-CM | POA: Diagnosis not present

## 2020-11-13 DIAGNOSIS — G8929 Other chronic pain: Secondary | ICD-10-CM

## 2020-11-13 DIAGNOSIS — M5441 Lumbago with sciatica, right side: Secondary | ICD-10-CM

## 2020-11-13 DIAGNOSIS — M4316 Spondylolisthesis, lumbar region: Secondary | ICD-10-CM

## 2020-11-13 IMAGING — DX DG LUMBAR SPINE COMPLETE 4+V
5 series · 5 of 5 positions shown · non-contrast
Comparison: [DATE]

CLINICAL DATA: Chronic low back pain with L5-S1 spondylolisthesis.

EXAM:
LUMBAR SPINE - COMPLETE 4+ VIEW

[l-spine ap]
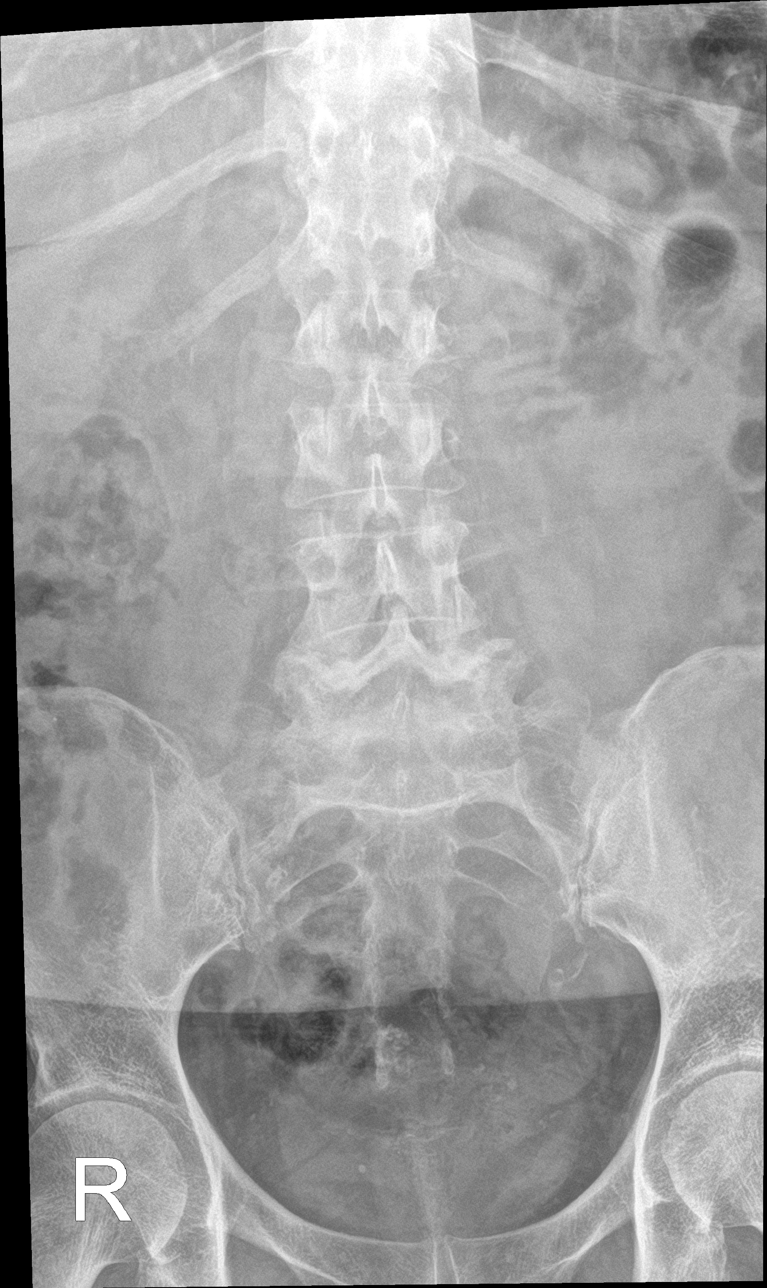

[l-spine obl (1 of 2)]
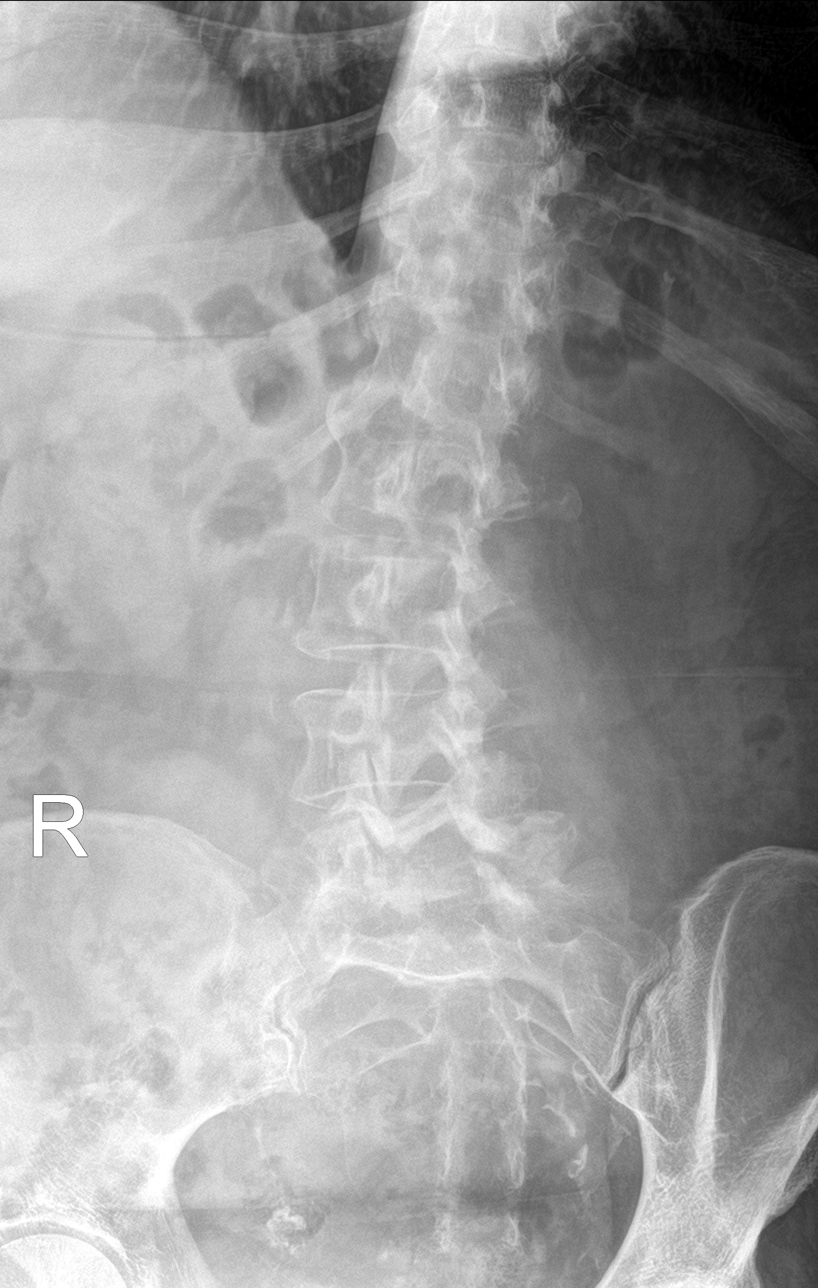

[l-spine obl (2 of 2)]
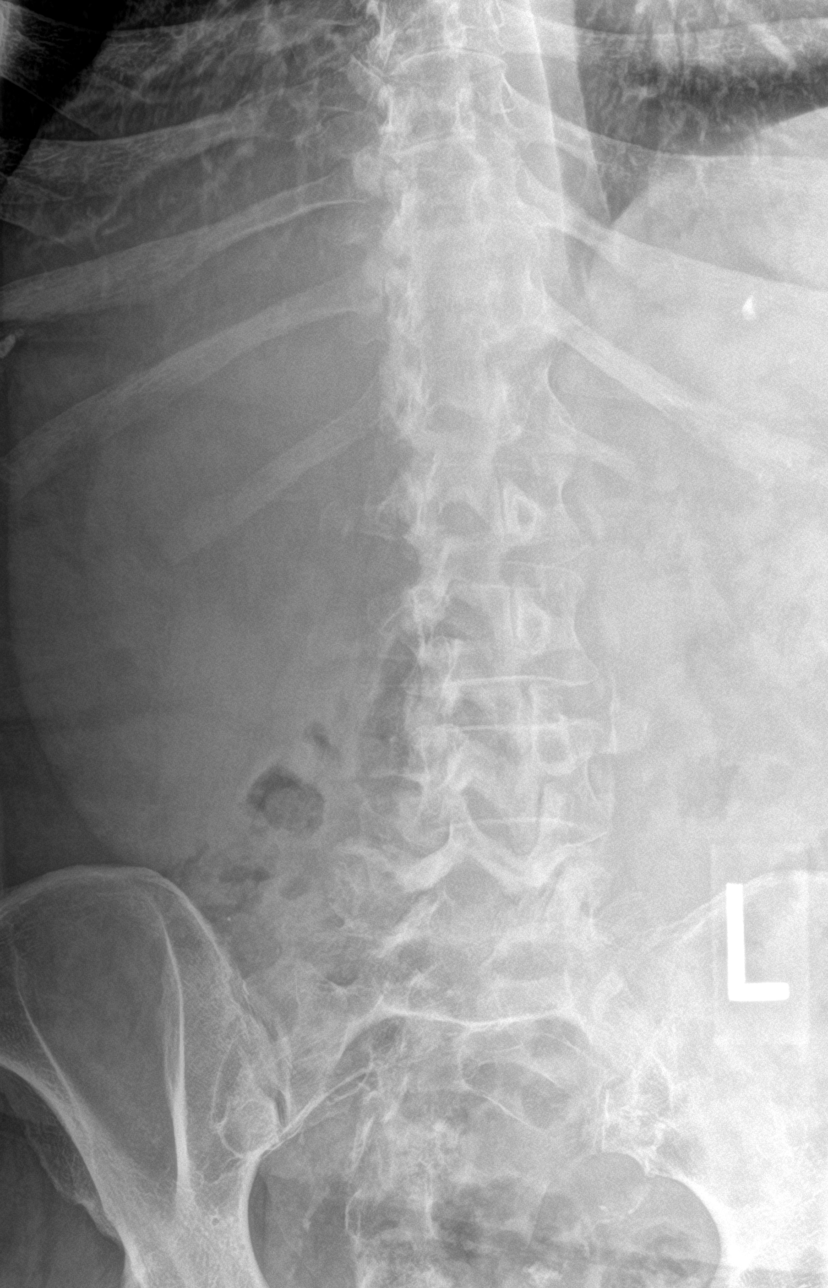

[l-spine lat]
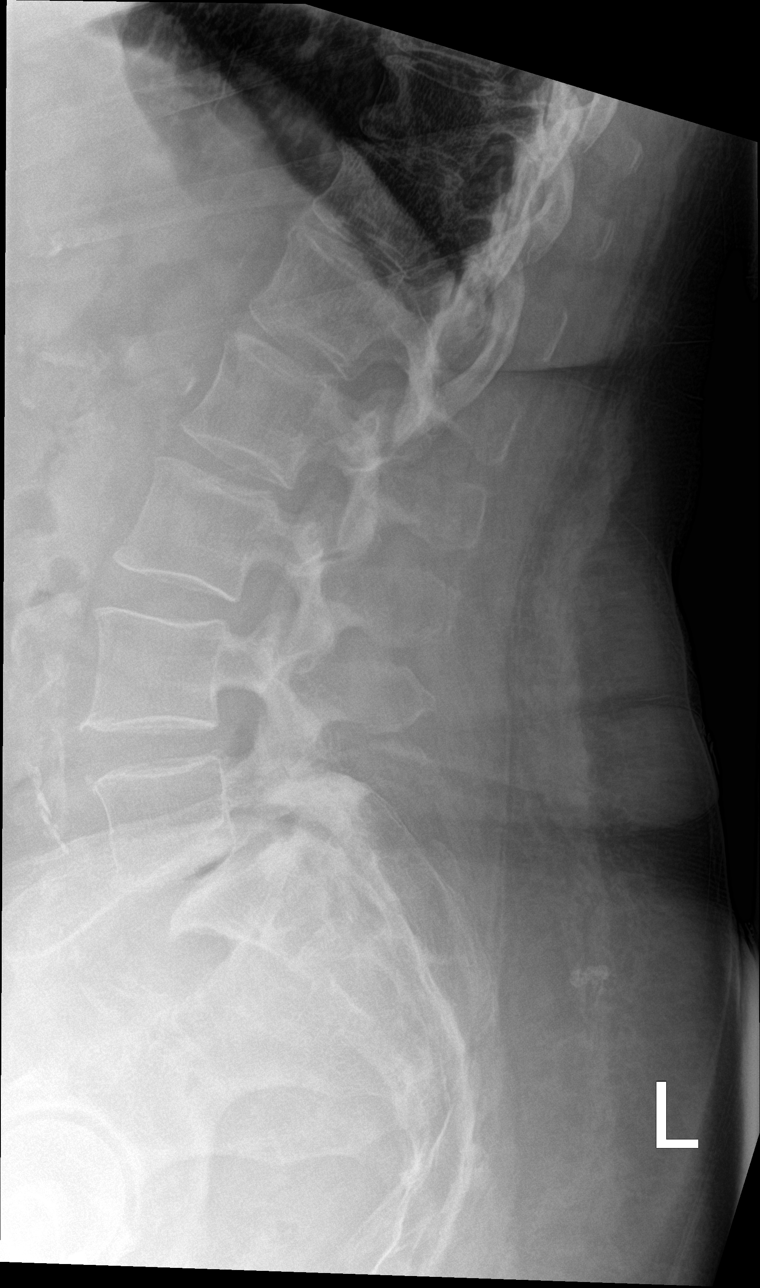

[l-spine spot]
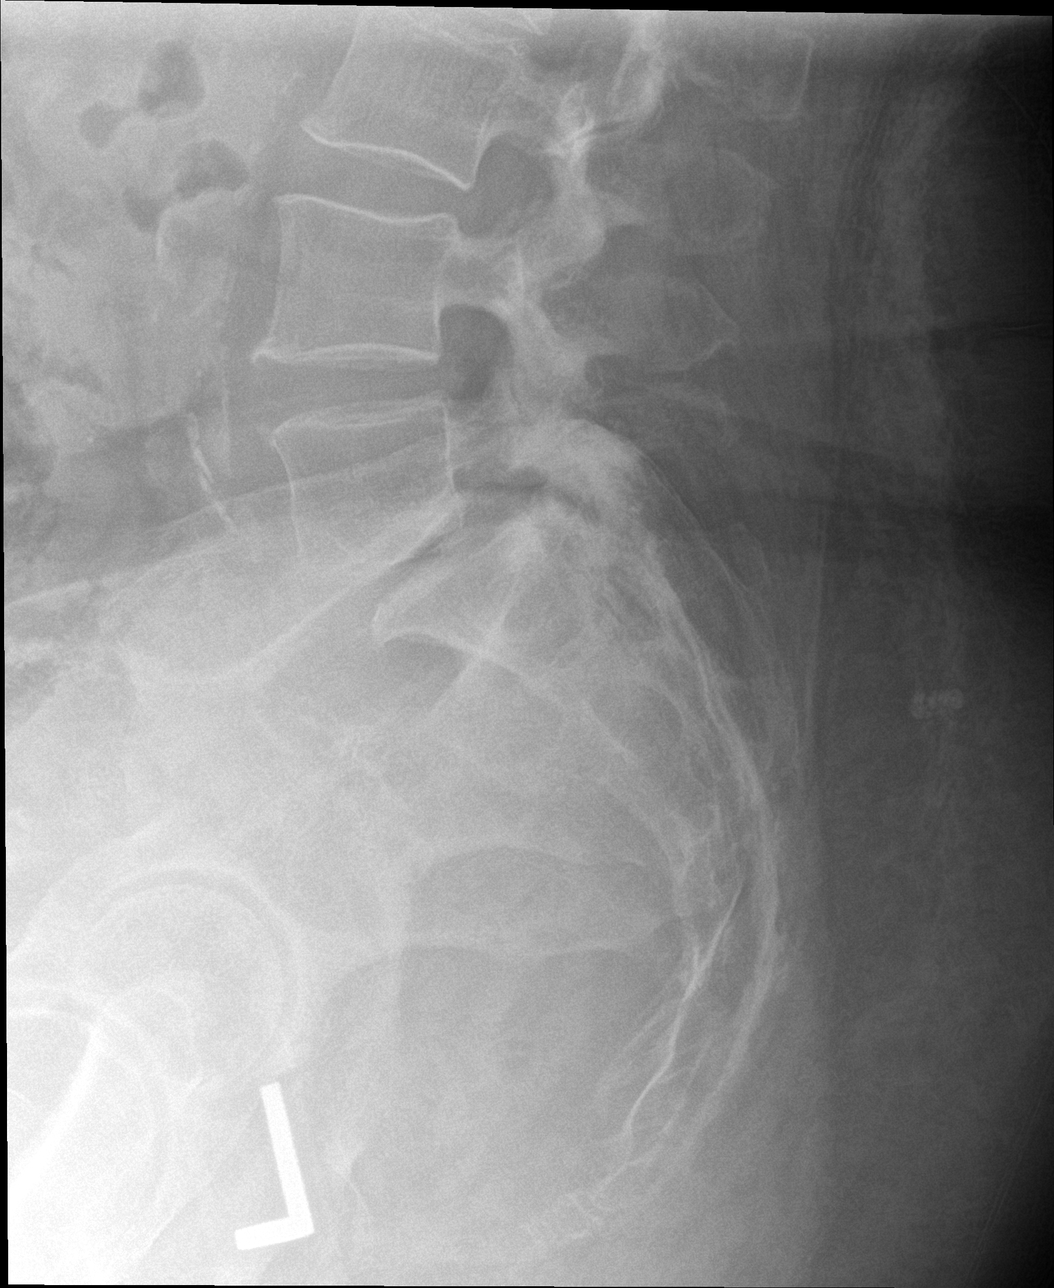

[5 of 5 positions shown; findings below may reference images not displayed]

FINDINGS: Mild curvature of the lumbar spine appears convex towards the right.
This is similar to previous exam anterolisthesis of L5 on S1
measures 1.3 cm. Previously this was measured at 9 mm. The vertebral
body heights are well preserved. Degenerative disc disease with
vacuum disc is noted at L5-S1. No pars defects identified.
IMPRESSION: 1. Mild progression of anterolisthesis of L5 on S1.
2. L5-S1 degenerative disc disease.

## 2020-11-13 MED ORDER — LIRAGLUTIDE 18 MG/3ML ~~LOC~~ SOPN: PEN_INJECTOR | SUBCUTANEOUS | 2 refills | Status: DC

## 2020-11-13 MED ORDER — LIRAGLUTIDE 18 MG/3ML ~~LOC~~ SOPN: PEN_INJECTOR | SUBCUTANEOUS | 1 refills | Status: DC

## 2020-11-13 MED ORDER — PREDNISONE 50 MG PO TABS: ORAL_TABLET | ORAL | 0 refills | Status: DC

## 2020-11-13 MED ORDER — TRIAZOLAM 0.25 MG PO TABS: ORAL_TABLET | ORAL | 0 refills | Status: DC

## 2020-11-13 NOTE — Telephone Encounter (Signed)
Pharmacy called and states the prescription needs to be changed to Start with .6mg  daily for one week then increase to 1.2mg  daily for one week then 1.8mg  daily injection into skin. Instead of Start with .6mg  daily for one week then increase to 1.8mg  daily for one week then 1.8mg  daily injection into skin.  Also instead of 3 ml it needs to be either 6 ml or 9 ml. The box comes with 2 pens or 3 pens. They can not break the box.

## 2020-11-13 NOTE — Assessment & Plan Note (Addendum)
This is a very pleasant 59 year old female with L5-S1 spondylolisthesis, grade 1 based on x-rays from 5 years ago. She has right lumbar radiculitis. She also has symptoms consistent with lumbar spinal stenosis, with pain better with flexion. She has done some chiropractic manipulation for greater than 6 weeks now with only minimal improvement, because of this I will consider her having failed greater than 6 weeks of physician directed conservative measures.  At this point we are going to proceed with updated x-rays, MRI with preprocedural sedation due to her severe claustrophobia, 5 days of prednisone to help her pain initially, and I like to see her back to go over MRI results, her interest is in an epidural injection.

## 2020-11-13 NOTE — Telephone Encounter (Signed)
Ok thank you! Sent.

## 2020-11-13 NOTE — Assessment & Plan Note (Signed)
Initially had a good response to Victoza, I did suggest she consider bariatric surgery. Her sister did it and has done extremely well with over 100 pound weight loss.  A gastric sleeve would likely save her hips, her back, her knees, and likely help her live longer. Just planting the seed today, she can discuss this with her PCP.

## 2020-11-13 NOTE — Progress Notes (Signed)
    Procedures performed today:    None.  Independent interpretation of notes and tests performed by another provider:   None.  Brief History, Exam, Impression, and Recommendations:    Chronic bilateral low back pain with right-sided sciatica This is a very pleasant 59 year old female with L5-S1 spondylolisthesis, grade 1 based on x-rays from 5 years ago. She has right lumbar radiculitis. She also has symptoms consistent with lumbar spinal stenosis, with pain better with flexion. She has done some chiropractic manipulation for greater than 6 weeks now with only minimal improvement, because of this I will consider her having failed greater than 6 weeks of physician directed conservative measures.  At this point we are going to proceed with updated x-rays, MRI with preprocedural sedation due to her severe claustrophobia, 5 days of prednisone to help her pain initially, and I like to see her back to go over MRI results, her interest is in an epidural injection.  Morbid obesity ([place]) Initially had a good response to Victoza, I did suggest she consider bariatric surgery. Her sister did it and has done extremely well with over 100 pound weight loss.  A gastric sleeve would likely save her hips, her back, her knees, and likely help her live longer. Just planting the seed today, she can discuss this with her PCP.    ___________________________________________ Gwen Her. Dianah Field, M.D., ABFM., CAQSM. Primary Care and [place] Instructor of [employer] [place] [employer] [facility] [employer]

## 2020-11-14 ENCOUNTER — Encounter: Payer: Self-pay | Admitting: Physician Assistant

## 2020-11-14 NOTE — Telephone Encounter (Signed)
Assistant has spoken with patient

## 2020-11-15 ENCOUNTER — Encounter: Payer: Self-pay | Admitting: Physician Assistant

## 2020-11-17 ENCOUNTER — Other Ambulatory Visit: Payer: Self-pay | Admitting: Physician Assistant

## 2020-11-17 DIAGNOSIS — E119 Type 2 diabetes mellitus without complications: Secondary | ICD-10-CM

## 2020-11-23 ENCOUNTER — Other Ambulatory Visit: Payer: Self-pay | Admitting: Physician Assistant

## 2020-11-23 DIAGNOSIS — I10 Essential (primary) hypertension: Secondary | ICD-10-CM

## 2020-11-23 DIAGNOSIS — E119 Type 2 diabetes mellitus without complications: Secondary | ICD-10-CM

## 2020-11-23 DIAGNOSIS — Z794 Long term (current) use of insulin: Secondary | ICD-10-CM

## 2020-11-27 ENCOUNTER — Encounter: Payer: Self-pay | Admitting: Physician Assistant

## 2020-11-28 ENCOUNTER — Other Ambulatory Visit: Payer: Self-pay | Admitting: Physician Assistant

## 2020-11-28 ENCOUNTER — Telehealth: Payer: Self-pay | Admitting: Physician Assistant

## 2020-11-28 DIAGNOSIS — F40240 Claustrophobia: Secondary | ICD-10-CM

## 2020-11-28 DIAGNOSIS — F5101 Primary insomnia: Secondary | ICD-10-CM

## 2020-11-28 NOTE — Telephone Encounter (Signed)
Done. I filled out this morning and Kathleen Le will fax.

## 2020-11-28 NOTE — Telephone Encounter (Signed)
Forms completed and mailed in envelope provided by patient to [facility].

## 2020-11-28 NOTE — Telephone Encounter (Signed)
Patient dropped off forms to be filled out, stated it was for a medication that she is taken. Kathleen Le it could be mailed after filled out. Paperwork left in provider box, AM

## 2020-11-30 ENCOUNTER — Other Ambulatory Visit: Payer: Self-pay | Admitting: Physician Assistant

## 2020-11-30 DIAGNOSIS — E119 Type 2 diabetes mellitus without complications: Secondary | ICD-10-CM

## 2020-12-01 ENCOUNTER — Other Ambulatory Visit: Payer: Self-pay

## 2020-12-01 ENCOUNTER — Ambulatory Visit (INDEPENDENT_AMBULATORY_CARE_PROVIDER_SITE_OTHER): Payer: PRIVATE HEALTH INSURANCE

## 2020-12-01 DIAGNOSIS — M5136 Other intervertebral disc degeneration, lumbar region: Secondary | ICD-10-CM

## 2020-12-01 DIAGNOSIS — M4317 Spondylolisthesis, lumbosacral region: Secondary | ICD-10-CM

## 2020-12-01 IMAGING — MR MR LUMBAR SPINE W/O CM
4 of 5 series · 26 of 48 positions shown · non-contrast
Comparison: Lumbar radiographs [DATE]

CLINICAL DATA: Low back pain with right radicular pain. History of
ovarian cancer.

EXAM:
MRI LUMBAR SPINE WITHOUT CONTRAST
TECHNIQUE: Multiplanar, multisequence MR imaging of the lumbar spine was
performed. No intravenous contrast was administered.

[Series 2: T2 · sagittal · 4.0mm · 0.81mm/px · 6 of 15 slices shown (1 of 2)]
[im 1/15]
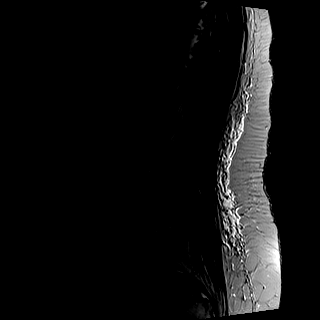
[im 3/15]
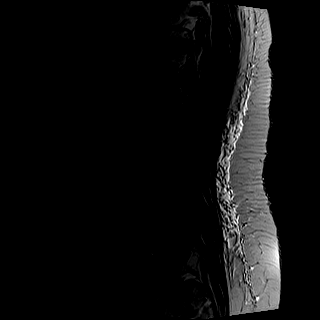
[im 6/15]
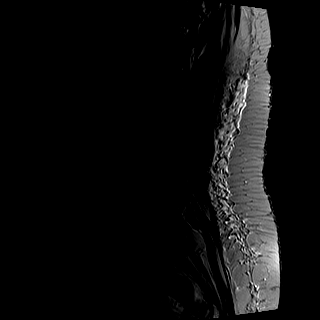
[im 9/15]
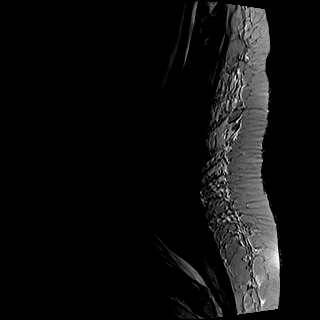
[im 12/15]
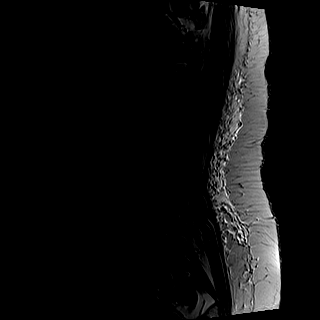
[im 15/15]
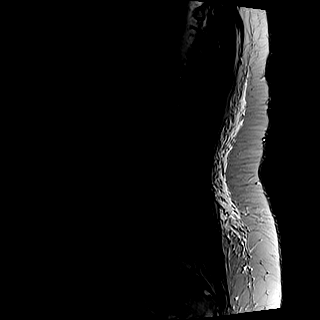

[Series 3: T1 · sagittal · 4.0mm · 0.41mm/px · 6 of 15 slices shown (1 of 2)]
[im 1/15]
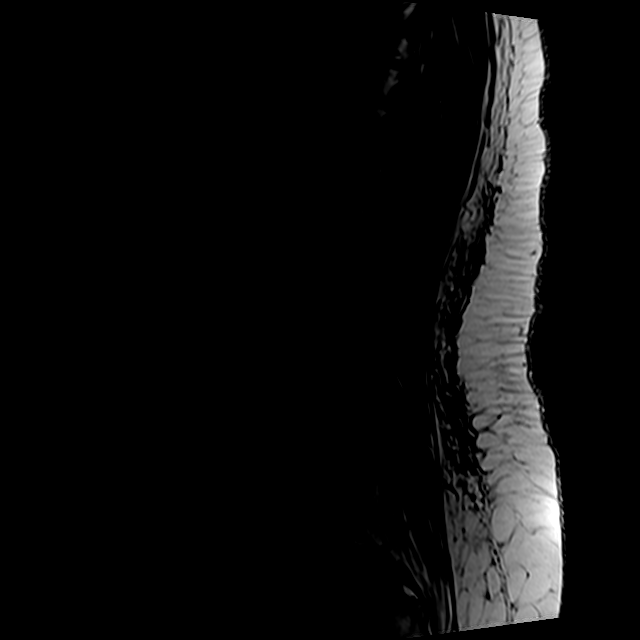
[im 3/15]
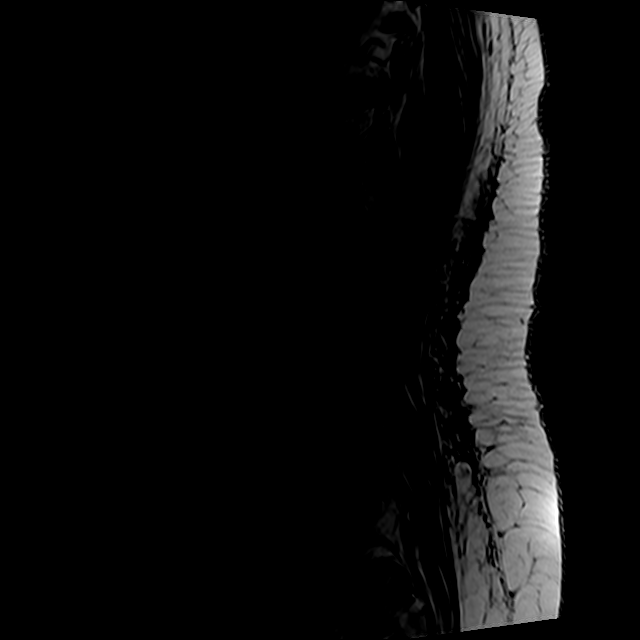
[im 6/15]
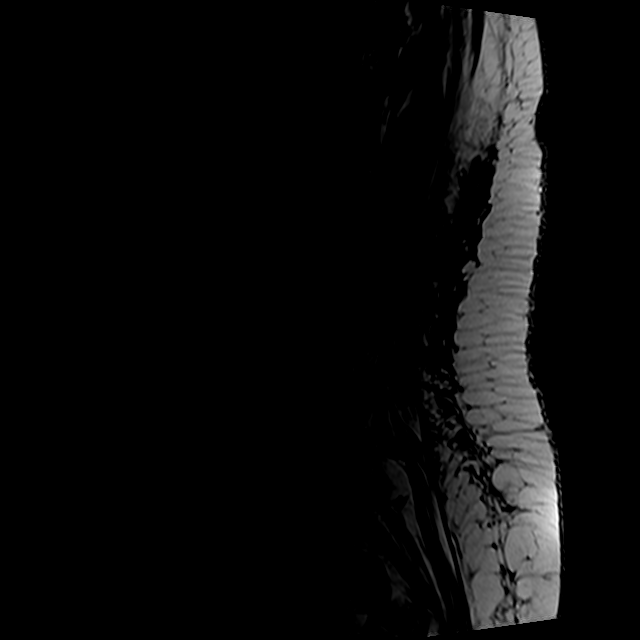
[im 9/15]
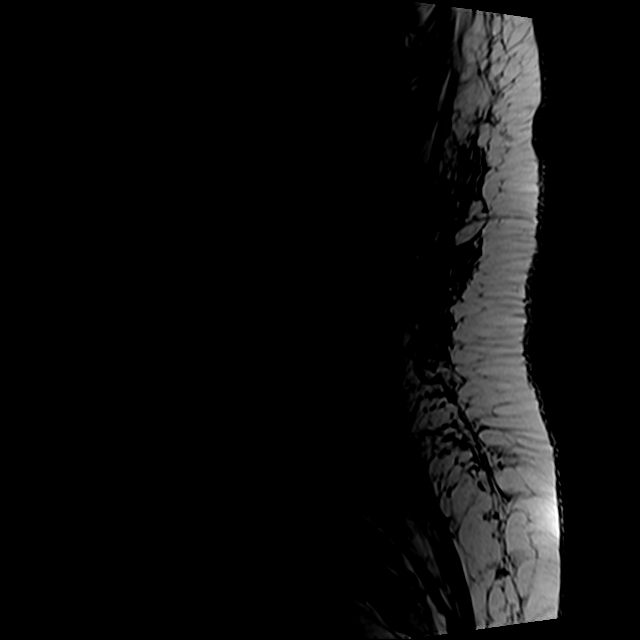
[im 12/15]
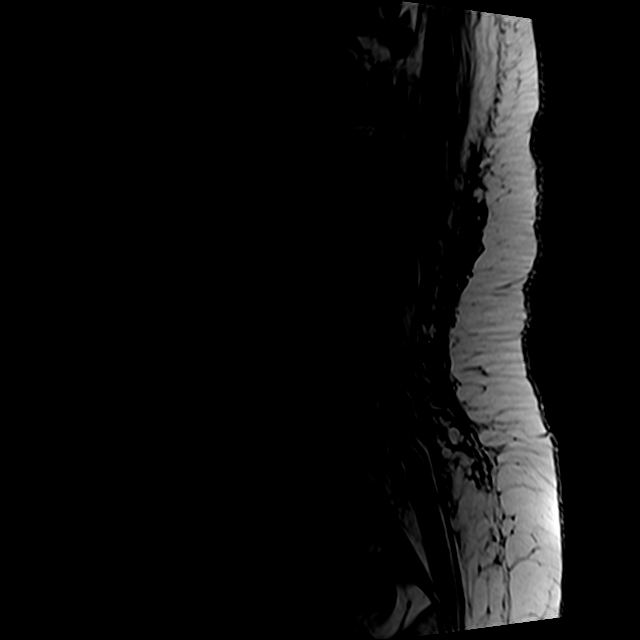
[im 15/15]
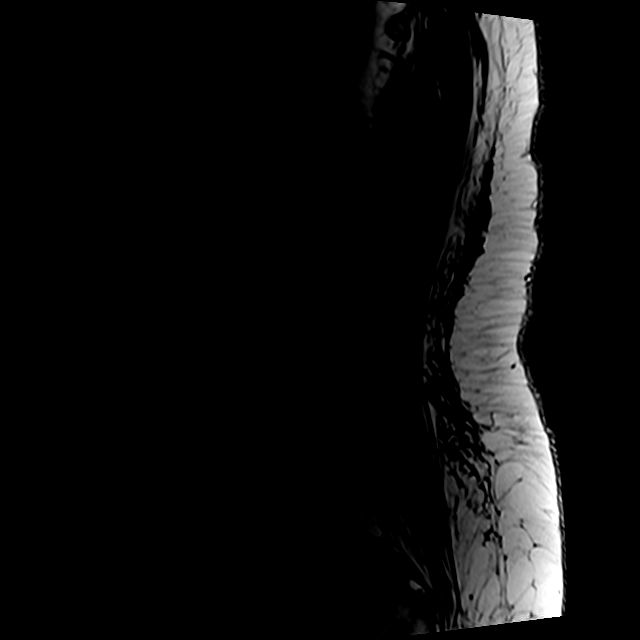

[Series 5: T2 · axial · 4.0mm · 0.78mm/px · z∈[-70,+156]mm · 9 of 41 slices shown (2 of 2)]
[im 1/41]
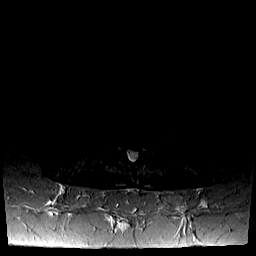
[im 6/41]
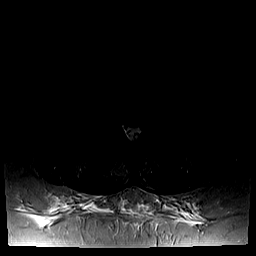
[im 12/41]
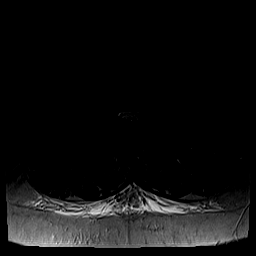
[im 18/41]
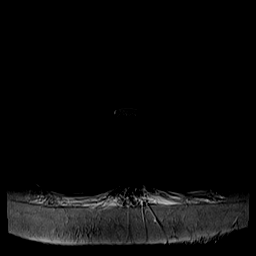
[im 21/41]
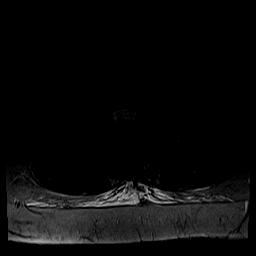
[im 23/41]
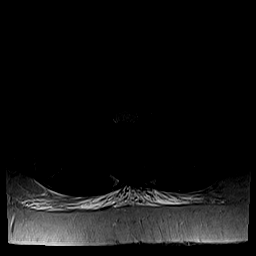
[im 29/41]
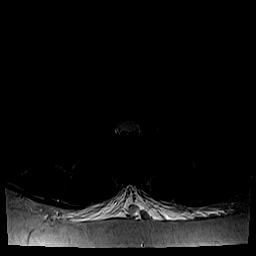
[im 35/41]
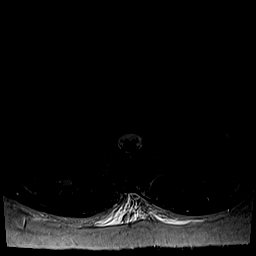
[im 41/41]
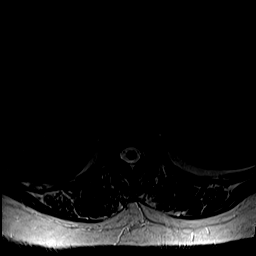

[Series 6: T1 · axial · 4.0mm · 0.39mm/px · z∈[-70,+126]mm · 5 of 41 slices shown (2 of 2)]
[im 1/41]
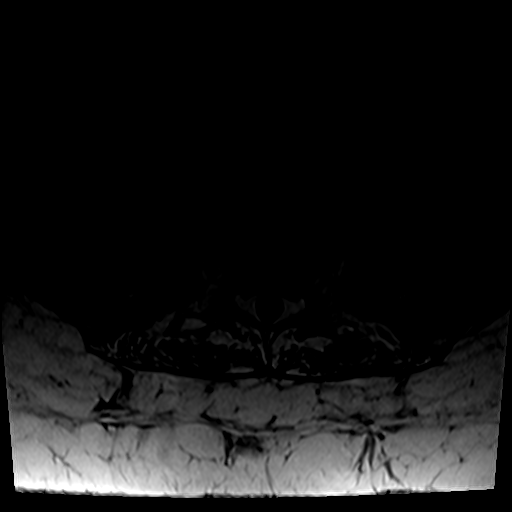
[im 6/41]
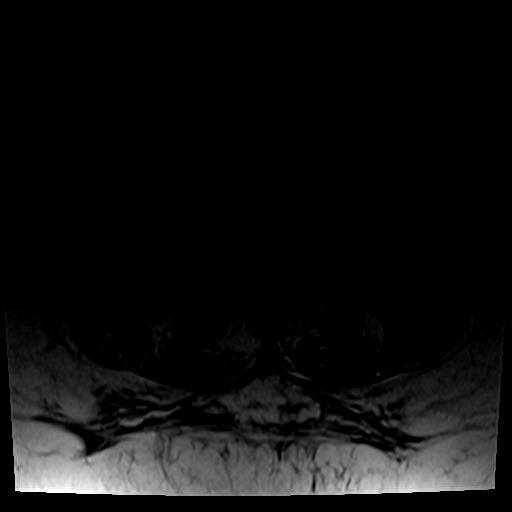
[im 12/41]
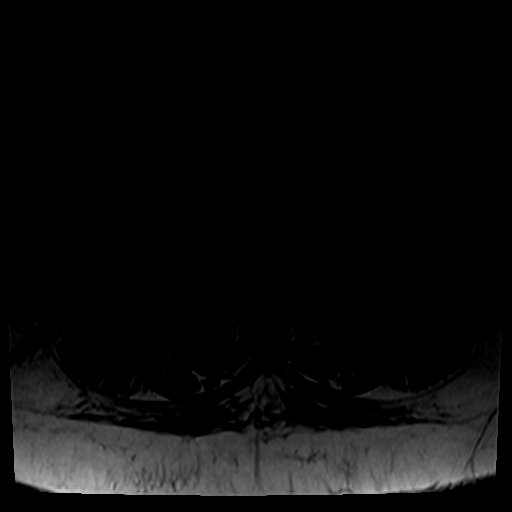
[im 21/41]
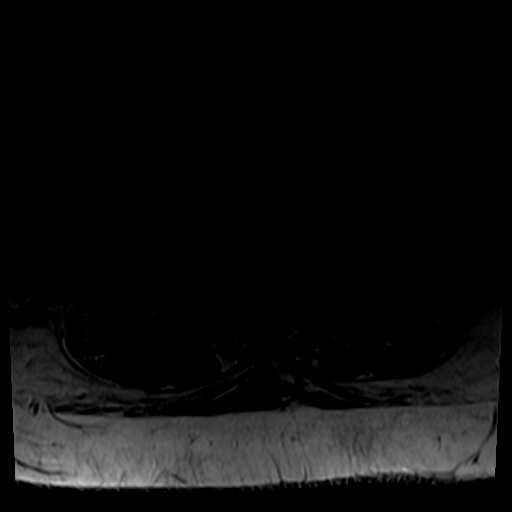
[im 35/41]
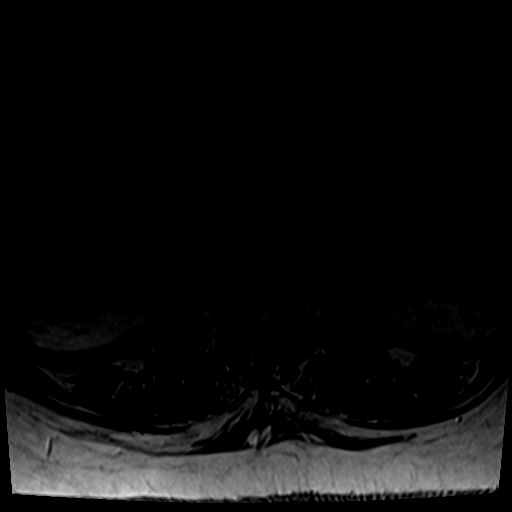

[26 of 48 positions shown; findings below may reference images not displayed]

FINDINGS: Segmentation:  Normal.  Lowest disc space L5-S1.

Alignment:  9 mm anterolisthesis L5-S1.  Remaining alignment normal.

Vertebrae: Negative for fracture or mass. Hemangioma T12 and L2
vertebral body.

Conus medullaris and cauda equina: Conus extends to the L2 level.
Conus and cauda equina appear normal.

Paraspinal and other soft tissues: Negative for paraspinous mass or
adenopathy.

Disc levels:

L1-2: Negative

L2-3: Negative

L3-4: Negative

L4-5: Mild facet degeneration. Mild disc bulging. Negative for
stenosis

L5-S1: 9 mm anterolisthesis with mild disc space narrowing and disc
bulging. Moderate to advanced facet degeneration bilaterally.
Moderate subarticular and foraminal stenosis bilaterally.
IMPRESSION: 9 mm anterolisthesis L5-S1 with bilateral facet degeneration.
Moderate subarticular and foraminal stenosis bilaterally.

## 2020-12-03 ENCOUNTER — Other Ambulatory Visit: Payer: Self-pay | Admitting: Physician Assistant

## 2020-12-03 DIAGNOSIS — G8929 Other chronic pain: Secondary | ICD-10-CM

## 2020-12-03 DIAGNOSIS — E119 Type 2 diabetes mellitus without complications: Secondary | ICD-10-CM

## 2020-12-03 DIAGNOSIS — Z794 Long term (current) use of insulin: Secondary | ICD-10-CM

## 2020-12-03 NOTE — Telephone Encounter (Signed)
Unsure of cause but I am going to go ahead and order the epidural with Dr. Francesco Runner, she can probably discuss this with him.

## 2020-12-05 ENCOUNTER — Encounter: Payer: Self-pay | Admitting: Physician Assistant

## 2020-12-06 ENCOUNTER — Encounter: Payer: Self-pay | Admitting: Physician Assistant

## 2020-12-06 NOTE — Telephone Encounter (Signed)
Looks like last message about Victoza says that you sent it in, no response from patient. What should she be taking? Very unclear due to her insurance issues.

## 2020-12-07 MED ORDER — GLIPIZIDE 10 MG PO TABS: 10.0000 mg | ORAL_TABLET | Freq: Two times a day (BID) | ORAL | 2 refills | Status: DC

## 2020-12-07 NOTE — Telephone Encounter (Signed)
Go back on glipizide for now. I will have to review her meds again because we cannot get insurance to pay for anything. Can she write out exactly what she is on and taking for me to review and make adjustments.

## 2020-12-10 ENCOUNTER — Encounter: Payer: Self-pay | Admitting: Physician Assistant

## 2020-12-17 ENCOUNTER — Other Ambulatory Visit: Payer: Self-pay | Admitting: Physician Assistant

## 2020-12-17 DIAGNOSIS — I10 Essential (primary) hypertension: Secondary | ICD-10-CM

## 2020-12-18 NOTE — Telephone Encounter (Signed)
FYI

## 2020-12-24 ENCOUNTER — Other Ambulatory Visit: Payer: Self-pay | Admitting: Physician Assistant

## 2020-12-24 DIAGNOSIS — Z794 Long term (current) use of insulin: Secondary | ICD-10-CM

## 2020-12-24 DIAGNOSIS — I10 Essential (primary) hypertension: Secondary | ICD-10-CM

## 2020-12-28 ENCOUNTER — Encounter: Payer: Self-pay | Admitting: Physician Assistant

## 2020-12-31 ENCOUNTER — Encounter: Payer: Self-pay | Admitting: Physician Assistant

## 2020-12-31 ENCOUNTER — Other Ambulatory Visit: Payer: Self-pay | Admitting: *Deleted

## 2020-12-31 DIAGNOSIS — I10 Essential (primary) hypertension: Secondary | ICD-10-CM

## 2020-12-31 DIAGNOSIS — Z794 Long term (current) use of insulin: Secondary | ICD-10-CM

## 2020-12-31 DIAGNOSIS — E119 Type 2 diabetes mellitus without complications: Secondary | ICD-10-CM

## 2020-12-31 MED ORDER — ATENOLOL 50 MG PO TABS: 50.0000 mg | ORAL_TABLET | Freq: Every day | ORAL | 0 refills | Status: DC

## 2020-12-31 MED ORDER — LISINOPRIL 5 MG PO TABS: 5.0000 mg | ORAL_TABLET | Freq: Every day | ORAL | 0 refills | Status: DC

## 2021-01-01 ENCOUNTER — Other Ambulatory Visit: Payer: Self-pay | Admitting: Physician Assistant

## 2021-01-01 DIAGNOSIS — Z794 Long term (current) use of insulin: Secondary | ICD-10-CM

## 2021-01-01 DIAGNOSIS — E119 Type 2 diabetes mellitus without complications: Secondary | ICD-10-CM

## 2021-01-03 NOTE — Telephone Encounter (Signed)
I refaxed referral from 3 of the 6 fax machines in our office to make sure they receive it this time. Patient can call tomorrow to check on it. - CF

## 2021-01-04 LAB — HM DIABETES EYE EXAM

## 2021-01-11 ENCOUNTER — Encounter: Payer: Self-pay | Admitting: Physician Assistant

## 2021-01-17 ENCOUNTER — Other Ambulatory Visit: Payer: Self-pay | Admitting: Physician Assistant

## 2021-01-17 DIAGNOSIS — Z794 Long term (current) use of insulin: Secondary | ICD-10-CM

## 2021-01-17 DIAGNOSIS — M4310 Spondylolisthesis, site unspecified: Secondary | ICD-10-CM

## 2021-01-17 DIAGNOSIS — G8929 Other chronic pain: Secondary | ICD-10-CM

## 2021-01-17 DIAGNOSIS — I10 Essential (primary) hypertension: Secondary | ICD-10-CM

## 2021-01-24 ENCOUNTER — Other Ambulatory Visit: Payer: Self-pay | Admitting: Physician Assistant

## 2021-01-24 DIAGNOSIS — I10 Essential (primary) hypertension: Secondary | ICD-10-CM

## 2021-01-30 ENCOUNTER — Other Ambulatory Visit: Payer: Self-pay | Admitting: Physician Assistant

## 2021-01-30 DIAGNOSIS — E119 Type 2 diabetes mellitus without complications: Secondary | ICD-10-CM

## 2021-01-30 DIAGNOSIS — Z794 Long term (current) use of insulin: Secondary | ICD-10-CM

## 2021-02-05 ENCOUNTER — Other Ambulatory Visit: Payer: Self-pay | Admitting: Physician Assistant

## 2021-02-07 MED ORDER — LISINOPRIL 5 MG PO TABS: 5.0000 mg | ORAL_TABLET | Freq: Every day | ORAL | 1 refills | Status: DC

## 2021-02-13 ENCOUNTER — Ambulatory Visit (INDEPENDENT_AMBULATORY_CARE_PROVIDER_SITE_OTHER): Payer: PRIVATE HEALTH INSURANCE | Admitting: Physician Assistant

## 2021-02-13 ENCOUNTER — Other Ambulatory Visit: Payer: Self-pay

## 2021-02-13 VITALS — BP 146/61 | HR 78 | Ht 65.0 in | Wt 270.0 lb

## 2021-02-13 DIAGNOSIS — E119 Type 2 diabetes mellitus without complications: Secondary | ICD-10-CM

## 2021-02-13 DIAGNOSIS — E1169 Type 2 diabetes mellitus with other specified complication: Secondary | ICD-10-CM | POA: Diagnosis not present

## 2021-02-13 DIAGNOSIS — E785 Hyperlipidemia, unspecified: Secondary | ICD-10-CM

## 2021-02-13 DIAGNOSIS — M5441 Lumbago with sciatica, right side: Secondary | ICD-10-CM | POA: Diagnosis not present

## 2021-02-13 DIAGNOSIS — Z794 Long term (current) use of insulin: Secondary | ICD-10-CM | POA: Diagnosis not present

## 2021-02-13 DIAGNOSIS — J432 Centrilobular emphysema: Secondary | ICD-10-CM | POA: Diagnosis not present

## 2021-02-13 DIAGNOSIS — G8929 Other chronic pain: Secondary | ICD-10-CM

## 2021-02-13 LAB — POCT GLYCOSYLATED HEMOGLOBIN (HGB A1C): Hemoglobin A1C: 7.5 % — AB (ref 4.0–5.6)

## 2021-02-13 MED ORDER — SITAGLIPTIN PHOSPHATE 100 MG PO TABS: 100.0000 mg | ORAL_TABLET | Freq: Every day | ORAL | 3 refills | Status: DC

## 2021-02-13 MED ORDER — GLIPIZIDE 10 MG PO TABS: 10.0000 mg | ORAL_TABLET | Freq: Two times a day (BID) | ORAL | 1 refills | Status: DC

## 2021-02-13 MED ORDER — METFORMIN HCL 1000 MG PO TABS: 1000.0000 mg | ORAL_TABLET | Freq: Two times a day (BID) | ORAL | 1 refills | Status: DC

## 2021-02-13 MED ORDER — TRELEGY ELLIPTA 200-62.5-25 MCG/INH IN AEPB: 1.0000 | INHALATION_SPRAY | Freq: Every day | RESPIRATORY_TRACT | 11 refills | Status: DC

## 2021-02-13 NOTE — Progress Notes (Signed)
Subjective:    Patient ID: Kathleen Le, female    DOB: [birth year], 60 y.o.   MRN: [id number]  HPI  Patient is a 60 year old obese female with type 2 diabetes, hypertension, hyperlipidemia, COPD and current smoker, chronic lower back pain who presents to the clinic for follow-up and medication refills  Patient is taking 10 units of Novolin at bedtime with Metformin, glipizide, Actos, Januvia.  She is checking her fasting sugars at times and they are around 1 10-1 30.  She denies any hypoglycemic events.  She denies any open sores or wounds.  She denies any chest pain, palpitations, headache, vision changes, dizziness.  She also has hypertension and compliant with her medications.  She also has chronic back pain.  She is on meloxicam and Cymbalta which minimally helped.  She is trying to become more mobile so she can lose weight.  She is seen sports medicine for this issue.  COPD-patient continues to smoke.  She is on Trelegy daily.  She has productive cough intermittently.    .. Active Ambulatory Problems    Diagnosis Date Noted  . Type II diabetes mellitus, uncontrolled ([place]) 02/03/2014  . Unspecified asthma(493.90) 02/03/2014  . Essential hypertension, benign 02/03/2014  . Neuropathy 02/05/2014  . IT band syndrome 02/05/2014  . Sinus tachycardia 02/05/2014  . Memory changes 02/05/2014  . Word finding difficulty 02/05/2014  . Cervical cancer ([place]) 02/05/2014  . Depression 05/19/2014  . Anxiety 05/19/2014  . Chronic bilateral low back pain with right-sided sciatica 05/19/2014  . DDD (degenerative disc disease), lumbar 05/19/2014  . PTSD (post-traumatic stress disorder) 09/26/2014  . Foot callus 10/03/2014  . Gastroesophageal reflux disease with esophagitis 01/10/2015  . Cerumen impaction 01/24/2015  . Hyperlipidemia 01/24/2015  . Pulmonary emphysema ([place]) 07/26/2012  . Morbid obesity ([place]) 08/31/2017  . Insomnia due to other mental disorder 10/27/2017  . History of substance  abuse ([place]) 10/27/2017  . GAD (generalized anxiety disorder) 10/27/2017  . Lower leg edema 11/10/2017  . Current smoker 06/08/2018  . Diabetes mellitus, type II, insulin dependent ([place]) 06/08/2018  . Claustrophobia 06/08/2018  . Primary insomnia 06/08/2018  . Hot flashes 06/08/2018  . Abnormal weight gain 06/08/2018  . Acute stress reaction 09/12/2018  . Anterolisthesis 07/30/2020  . Hyperlipidemia associated with type 2 diabetes mellitus ([place]) 11/05/2020  . Non-restorative sleep 11/05/2020  . Snoring 11/05/2020  . Chronic obstructive pulmonary disease ([place]) 02/26/2021   Resolved Ambulatory Problems    Diagnosis Date Noted  . Bruised rib 09/26/2014  . Bruised ribs 09/26/2014  . Wound of right leg 09/26/2014  . Essential hypertension 11/10/2017  . Former smoker 03/09/2018   Past Medical History:  Diagnosis Date  . Asthma   . Diabetes mellitus without complication ([place])   . History of alcohol abuse   . History of marijuana use   . Hypertension   . Substance abuse ([place])      Review of Systems  All other systems reviewed and are negative.  See HPI.     Objective:   Physical Exam Vitals reviewed.  Constitutional:      Appearance: Normal appearance. She is obese.  HENT:     Head: Normocephalic.  Cardiovascular:     Rate and Rhythm: Normal rate and regular rhythm.     Pulses: Normal pulses.     Heart sounds: Normal heart sounds.  Pulmonary:     Effort: Pulmonary effort is normal.     Breath sounds: Normal breath sounds.  Musculoskeletal:  Right lower leg: No edema.     Left lower leg: No edema.  Neurological:     General: No focal deficit present.     Mental Status: She is alert and oriented to person, place, and time.  Psychiatric:        Mood and Affect: Mood normal.          .. Lab Results  Component Value Date   HGBA1C 7.5 (A) 02/13/2021    Assessment & Plan:  [name] KitchenGlynda was seen today for diabetes.  Diagnoses and all orders for this  visit:  Diabetes mellitus, type II, insulin dependent ([place]) -     POCT glycosylated hemoglobin (Hb A1C) -     sitaGLIPtin (JANUVIA) 100 MG tablet; Take 1 tablet (100 mg total) by mouth daily. -     glipiZIDE (GLUCOTROL) 10 MG tablet; Take 1 tablet (10 mg total) by mouth 2 (two) times daily before a meal. -     metFORMIN (GLUCOPHAGE) 1000 MG tablet; Take 1 tablet (1,000 mg total) by mouth 2 (two) times daily with a meal.  Hyperlipidemia associated with type 2 diabetes mellitus ([place])  Chronic bilateral low back pain with right-sided sciatica  Centrilobular emphysema ([place]) -     Fluticasone-Umeclidin-Vilant (TRELEGY ELLIPTA) 200-62.5-25 MCG/INH AEPB; Inhale 1 puff into the lungs daily.   A1c is not to goal. Increase glipizide to twice a day. Discussed patient must titrate nighttime insulin until fasting sugars are 120 or below.  Goal is right around 100. Unfortunately patient's insurance will not cover GLP-1's right now.  If in the future they do I would like to replace Januvia with a GLP-1. BP not to goal. Added ACE.  On STATIN.  Foot exam up-to-date. Eye exam up-to-date Declined Covid vaccine Declined flu shot Pneumonia shot up-to-date Follow-up in 3 months.  COPD continue trilogy use rescue albuterol inhaler as needed.  Strongly consider smoking cessation. Cutting back.   Low back pain continue management with sports medicine.  Continue Cymbalta and meloxicam.

## 2021-02-22 ENCOUNTER — Other Ambulatory Visit: Payer: Self-pay | Admitting: Physician Assistant

## 2021-02-22 DIAGNOSIS — G8929 Other chronic pain: Secondary | ICD-10-CM

## 2021-02-22 DIAGNOSIS — M4310 Spondylolisthesis, site unspecified: Secondary | ICD-10-CM

## 2021-02-26 ENCOUNTER — Encounter: Payer: Self-pay | Admitting: Physician Assistant

## 2021-02-26 DIAGNOSIS — J449 Chronic obstructive pulmonary disease, unspecified: Secondary | ICD-10-CM

## 2021-02-26 HISTORY — DX: Chronic obstructive pulmonary disease, unspecified: J44.9

## 2021-03-12 ENCOUNTER — Encounter: Payer: Self-pay | Admitting: Physician Assistant

## 2021-03-19 ENCOUNTER — Encounter: Payer: Self-pay | Admitting: Physician Assistant

## 2021-03-19 DIAGNOSIS — Z794 Long term (current) use of insulin: Secondary | ICD-10-CM

## 2021-03-19 DIAGNOSIS — E119 Type 2 diabetes mellitus without complications: Secondary | ICD-10-CM

## 2021-03-20 MED ORDER — "INSULIN SYRINGE 31G X 5/16"" 0.3 ML MISC": 99 refills | Status: DC

## 2021-03-25 ENCOUNTER — Encounter: Payer: Self-pay | Admitting: Physician Assistant

## 2021-03-25 MED ORDER — ALBUTEROL SULFATE HFA 108 (90 BASE) MCG/ACT IN AERS: 2.0000 | INHALATION_SPRAY | Freq: Four times a day (QID) | RESPIRATORY_TRACT | 2 refills | Status: DC | PRN

## 2021-03-27 ENCOUNTER — Telehealth: Payer: Self-pay

## 2021-03-27 NOTE — Telephone Encounter (Signed)
I did not see in my inbox and just cleaned it out. JJ did you see it?

## 2021-03-27 NOTE — Telephone Encounter (Signed)
Pt dropped off paperwork that needs to be filled out by the doctor. This paperwork is for a Rx. Pt wanted to be alerted in mychart and and a phone call when the paperwork is finished. Paperwork left in the message box- tvt

## 2021-03-28 NOTE — Telephone Encounter (Signed)
I do have form. Completed and faxed to [facility] at [phone] with confirmation received.

## 2021-04-09 ENCOUNTER — Encounter: Payer: Self-pay | Admitting: Physician Assistant

## 2021-04-09 NOTE — Telephone Encounter (Signed)
Looks like this is probably the epidural ordered by Dr. Darene Lamer?

## 2021-04-09 NOTE — Telephone Encounter (Signed)
Is there anything you can submit to insurance for epidural injection?

## 2021-04-09 NOTE — Telephone Encounter (Signed)
That order was sent to Dr. Francesco Runner, because he is doing the injection he has to get prior authorization for it.  She should really be contacting Dr. Isabelle Course office.  [facility] here in [place].

## 2021-05-20 ENCOUNTER — Encounter: Payer: Self-pay | Admitting: Physician Assistant

## 2021-05-20 DIAGNOSIS — J432 Centrilobular emphysema: Secondary | ICD-10-CM

## 2021-05-21 MED ORDER — TRELEGY ELLIPTA 200-62.5-25 MCG/INH IN AEPB: 1.0000 | INHALATION_SPRAY | Freq: Every day | RESPIRATORY_TRACT | 3 refills | Status: DC

## 2021-05-31 ENCOUNTER — Other Ambulatory Visit: Payer: Self-pay | Admitting: Physician Assistant

## 2021-05-31 DIAGNOSIS — M4310 Spondylolisthesis, site unspecified: Secondary | ICD-10-CM

## 2021-05-31 DIAGNOSIS — G8929 Other chronic pain: Secondary | ICD-10-CM

## 2021-06-14 ENCOUNTER — Other Ambulatory Visit: Payer: Self-pay | Admitting: Physician Assistant

## 2021-06-14 DIAGNOSIS — I10 Essential (primary) hypertension: Secondary | ICD-10-CM

## 2021-06-22 ENCOUNTER — Other Ambulatory Visit: Payer: Self-pay | Admitting: Physician Assistant

## 2021-06-22 DIAGNOSIS — I10 Essential (primary) hypertension: Secondary | ICD-10-CM

## 2021-06-25 ENCOUNTER — Encounter: Payer: Self-pay | Admitting: Physician Assistant

## 2021-06-25 DIAGNOSIS — I10 Essential (primary) hypertension: Secondary | ICD-10-CM

## 2021-06-25 MED ORDER — AMLODIPINE BESYLATE 5 MG PO TABS: 5.0000 mg | ORAL_TABLET | Freq: Every day | ORAL | 1 refills | Status: DC

## 2021-06-30 ENCOUNTER — Other Ambulatory Visit: Payer: Self-pay | Admitting: Physician Assistant

## 2021-06-30 DIAGNOSIS — Z794 Long term (current) use of insulin: Secondary | ICD-10-CM

## 2021-06-30 MED ORDER — INSULIN SYRINGE 31G X 5/16" 0.3 ML MISC
99 refills | Status: DC
Start: 2021-06-30 — End: 2024-06-07

## 2021-06-30 MED ORDER — INSULIN NPH (HUMAN) (ISOPHANE) 100 UNIT/ML ~~LOC~~ SUSP: 40.0000 [IU] | Freq: Every day | SUBCUTANEOUS | 0 refills | Status: DC

## 2021-06-30 NOTE — Progress Notes (Signed)
[facility] on call nurse called and stated patient was out of town and left insulin and needed insulin and needles. 10 days were sent to the pharmacy.

## 2021-07-03 ENCOUNTER — Other Ambulatory Visit: Payer: Self-pay | Admitting: Physician Assistant

## 2021-07-03 DIAGNOSIS — G8929 Other chronic pain: Secondary | ICD-10-CM

## 2021-07-03 DIAGNOSIS — M4310 Spondylolisthesis, site unspecified: Secondary | ICD-10-CM

## 2021-07-29 ENCOUNTER — Other Ambulatory Visit: Payer: Self-pay | Admitting: Physician Assistant

## 2021-07-29 DIAGNOSIS — M4310 Spondylolisthesis, site unspecified: Secondary | ICD-10-CM

## 2021-07-29 DIAGNOSIS — G8929 Other chronic pain: Secondary | ICD-10-CM

## 2021-07-31 ENCOUNTER — Encounter: Payer: Self-pay | Admitting: Physician Assistant

## 2021-07-31 MED ORDER — MELOXICAM 15 MG PO TABS: 15.0000 mg | ORAL_TABLET | Freq: Every day | ORAL | 0 refills | Status: DC

## 2021-07-31 MED ORDER — INSULIN NPH (HUMAN) (ISOPHANE) 100 UNIT/ML ~~LOC~~ SUSP: 40.0000 [IU] | Freq: Every day | SUBCUTANEOUS | 0 refills | Status: DC

## 2021-08-16 ENCOUNTER — Encounter: Payer: Self-pay | Admitting: Physician Assistant

## 2021-08-16 ENCOUNTER — Ambulatory Visit (INDEPENDENT_AMBULATORY_CARE_PROVIDER_SITE_OTHER): Payer: PRIVATE HEALTH INSURANCE | Admitting: Physician Assistant

## 2021-08-16 ENCOUNTER — Other Ambulatory Visit: Payer: Self-pay

## 2021-08-16 VITALS — BP 139/59 | HR 82 | Ht 66.0 in | Wt 270.0 lb

## 2021-08-16 DIAGNOSIS — J432 Centrilobular emphysema: Secondary | ICD-10-CM | POA: Diagnosis not present

## 2021-08-16 DIAGNOSIS — Z6841 Body Mass Index (BMI) 40.0 and over, adult: Secondary | ICD-10-CM

## 2021-08-16 DIAGNOSIS — I10 Essential (primary) hypertension: Secondary | ICD-10-CM | POA: Diagnosis not present

## 2021-08-16 DIAGNOSIS — E119 Type 2 diabetes mellitus without complications: Secondary | ICD-10-CM

## 2021-08-16 DIAGNOSIS — E1165 Type 2 diabetes mellitus with hyperglycemia: Secondary | ICD-10-CM | POA: Diagnosis not present

## 2021-08-16 DIAGNOSIS — G8929 Other chronic pain: Secondary | ICD-10-CM

## 2021-08-16 DIAGNOSIS — K2100 Gastro-esophageal reflux disease with esophagitis, without bleeding: Secondary | ICD-10-CM

## 2021-08-16 DIAGNOSIS — F17200 Nicotine dependence, unspecified, uncomplicated: Secondary | ICD-10-CM

## 2021-08-16 DIAGNOSIS — M5441 Lumbago with sciatica, right side: Secondary | ICD-10-CM

## 2021-08-16 LAB — POCT GLYCOSYLATED HEMOGLOBIN (HGB A1C): Hemoglobin A1C: 7.7 % — AB (ref 4.0–5.6)

## 2021-08-16 MED ORDER — UMECLIDINIUM-VILANTEROL 62.5-25 MCG/INH IN AEPB: 1.0000 | INHALATION_SPRAY | Freq: Every day | RESPIRATORY_TRACT | 11 refills | Status: DC

## 2021-08-16 MED ORDER — TRAMADOL HCL 50 MG PO TABS: 50.0000 mg | ORAL_TABLET | Freq: Four times a day (QID) | ORAL | 0 refills | Status: AC | PRN

## 2021-08-16 MED ORDER — DAPAGLIFLOZIN PROPANEDIOL 10 MG PO TABS: 10.0000 mg | ORAL_TABLET | Freq: Every day | ORAL | 3 refills | Status: DC

## 2021-08-16 NOTE — Progress Notes (Addendum)
Subjective:    Patient ID: Kathleen Le, female    DOB: 14-Dec-1961, 60 y.o.   MRN: [id number]  HPI Pt is a 60 yo obese female with T2DM, HTN, HLD, COPD, GERD who presents to the clinic for medication refill and 3 month follow up.   Pt has lots of problems getting medications covered. She is currently not taking trelegy due to cost.  She continues to smoke and breathing is getting worse.   She is not checking her sugars. She needs more strips and lancets. She is taking Januvia, Actos, metformin, Novolin 17 units at bedtime, and glipizide. She denies any hypoglycemic events. Denies any CP, palpitations, headaches or vision changes.   She continues to snore and not get good rest but declines sleep study.   She continues to have chronic low back pain. He injections helped for about 1-2 months but getting worse. She has been told they can do nerve ablation but it cost a lot of money and insurance will not pay for it. She wants to be more active but pain keeps her limited.   .. Active Ambulatory Problems    Diagnosis Date Noted   Type II diabetes mellitus, uncontrolled ([place]) 02/03/2014   Unspecified asthma(493.90) 02/03/2014   Essential hypertension, benign 02/03/2014   Neuropathy 02/05/2014   IT band syndrome 02/05/2014   Sinus tachycardia 02/05/2014   Memory changes 02/05/2014   Word finding difficulty 02/05/2014   Cervical cancer ([place]) 02/05/2014   Depression 05/19/2014   Anxiety 05/19/2014   Chronic bilateral low back pain with right-sided sciatica 05/19/2014   DDD (degenerative disc disease), lumbar 05/19/2014   PTSD (post-traumatic stress disorder) 09/26/2014   Foot callus 10/03/2014   Gastroesophageal reflux disease with esophagitis 01/10/2015   Cerumen impaction 01/24/2015   Hyperlipidemia 01/24/2015   Pulmonary emphysema ([place]) 07/26/2012   Class 3 severe obesity due to excess calories with serious comorbidity and body mass index (BMI) of 40.0 to 44.9 in adult ([place])  08/31/2017   Insomnia due to other mental disorder 10/27/2017   History of substance abuse ([place]) 10/27/2017   GAD (generalized anxiety disorder) 10/27/2017   Lower leg edema 11/10/2017   Current smoker 06/08/2018   Diabetes mellitus, type II, insulin dependent ([place]) 06/08/2018   Claustrophobia 06/08/2018   Primary insomnia 06/08/2018   Hot flashes 06/08/2018   Abnormal weight gain 06/08/2018   Acute stress reaction 09/12/2018   Anterolisthesis 07/30/2020   Hyperlipidemia associated with type 2 diabetes mellitus ([place]) 11/05/2020   Non-restorative sleep 11/05/2020   Snoring 11/05/2020   Chronic obstructive pulmonary disease ([place]) 02/26/2021   Resolved Ambulatory Problems    Diagnosis Date Noted   Bruised rib 09/26/2014   Bruised ribs 09/26/2014   Wound of right leg 09/26/2014   Essential hypertension 11/10/2017   Former smoker 03/09/2018   Past Medical History:  Diagnosis Date   Asthma    Diabetes mellitus without complication ([place])    History of alcohol abuse    History of marijuana use    Hypertension    Substance abuse ([place])     Review of Systems   See HPI.  Objective:   Physical Exam Vitals reviewed.  Constitutional:      Appearance: Normal appearance. She is obese.  HENT:     Head: Normocephalic.  Cardiovascular:     Rate and Rhythm: Normal rate and regular rhythm.     Pulses: Normal pulses.  Pulmonary:     Effort: Pulmonary effort is normal.  Breath sounds: Wheezing present.     Comments: Coarse breath sounds.  Abdominal:     General: Bowel sounds are normal. There is no distension.     Palpations: Abdomen is soft. There is no mass.     Tenderness: There is no abdominal tenderness. There is no right CVA tenderness, left CVA tenderness, guarding or rebound.  Musculoskeletal:        General: Normal range of motion.     Right lower leg: No edema.     Left lower leg: No edema.  Neurological:     General: No focal deficit present.     Mental Status:  She is alert and oriented to person, place, and time.  Psychiatric:        Mood and Affect: Mood normal.      .. Results for orders placed or performed in visit on 08/16/21  POCT glycosylated hemoglobin (Hb A1C)  Result Value Ref Range   Hemoglobin A1C 7.7 (A) 4.0 - 5.6 %   HbA1c POC (<> result, manual entry)     HbA1c, POC (prediabetic range)     HbA1c, POC (controlled diabetic range)         Assessment & Plan:  Kathleen Le was seen today for diabetes.  Diagnoses and all orders for this visit:  Uncontrolled type 2 diabetes mellitus with hyperglycemia ([place]) -     POCT glycosylated hemoglobin (Hb A1C) -     dapagliflozin propanediol (FARXIGA) 10 MG TABS tablet; Take 1 tablet (10 mg total) by mouth daily. -     glucose blood test strip; Use up to 4 times per day as directed with glucometer. Disp: 100. Refill x99 please dispense brand per insurance coverage/patient preference  Class 3 severe obesity due to excess calories with serious comorbidity and body mass index (BMI) of 40.0 to 44.9 in adult [facility])  Essential hypertension, benign  Centrilobular emphysema ([facility]) -     umeclidinium-vilanterol (ANORO ELLIPTA) 62.5-25 MCG/INH AEPB; Inhale 1 puff into the lungs daily.  Gastroesophageal reflux disease with esophagitis, unspecified whether hemorrhage  Chronic bilateral low back pain with right-sided sciatica -     traMADol (ULTRAM) 50 MG tablet; Take 1 tablet (50 mg total) by mouth every 6 (six) hours as needed.  Current smoker -     Ambulatory Referral Lung Cancer Screening [facility]  A1C not to goal.  Need to start checking sugars. Strips sent to pharmacy.  Need to increase novolin to 2 units every 5 days until fasting glucose is under 130 or get to 40 units.  Added farxiga.  Continue other medications.  Insurance will not pay for GLP-1.  Declines covid vaccine.  Pneumonia vaccine UTD.  Needs flu shot.   Continued to stress smoking cessation.  Will see if anoro is  approved.  Order to see if qualify for lung cancer screening.   Kathleen Le [name] reviewed during this encounter. Continues to have chronic low back pain.  Trial of tramadol.  No hx of seizures.  If this can get her moving a little could be worth it in moderation.   Follow up in 3 months.

## 2021-08-16 NOTE — Patient Instructions (Addendum)
Check sugars in the morning.  Goal is 90 to 130.  Novolin:  Increase by 2 units every 5 days until to go or 40 units.

## 2021-08-19 ENCOUNTER — Encounter: Payer: Self-pay | Admitting: Physician Assistant

## 2021-08-20 ENCOUNTER — Other Ambulatory Visit: Payer: Self-pay | Admitting: Physician Assistant

## 2021-08-20 MED ORDER — GLUCOSE BLOOD VI STRP: ORAL_STRIP | 99 refills | Status: DC

## 2021-08-20 MED ORDER — LANCETS 30G MISC: 99 refills | Status: DC

## 2021-08-20 MED ORDER — ALBUTEROL SULFATE HFA 108 (90 BASE) MCG/ACT IN AERS: 2.0000 | INHALATION_SPRAY | Freq: Four times a day (QID) | RESPIRATORY_TRACT | 2 refills | Status: DC | PRN

## 2021-08-22 ENCOUNTER — Other Ambulatory Visit: Payer: Self-pay | Admitting: Physician Assistant

## 2021-08-22 DIAGNOSIS — I10 Essential (primary) hypertension: Secondary | ICD-10-CM

## 2021-08-27 ENCOUNTER — Other Ambulatory Visit: Payer: Self-pay | Admitting: Physician Assistant

## 2021-08-28 ENCOUNTER — Other Ambulatory Visit: Payer: Self-pay | Admitting: Physician Assistant

## 2021-08-28 DIAGNOSIS — G8929 Other chronic pain: Secondary | ICD-10-CM

## 2021-08-28 DIAGNOSIS — Z794 Long term (current) use of insulin: Secondary | ICD-10-CM

## 2021-08-28 DIAGNOSIS — M4310 Spondylolisthesis, site unspecified: Secondary | ICD-10-CM

## 2021-08-28 DIAGNOSIS — E119 Type 2 diabetes mellitus without complications: Secondary | ICD-10-CM

## 2021-09-11 ENCOUNTER — Other Ambulatory Visit: Payer: Self-pay | Admitting: Physician Assistant

## 2021-09-11 DIAGNOSIS — I10 Essential (primary) hypertension: Secondary | ICD-10-CM

## 2021-09-18 ENCOUNTER — Other Ambulatory Visit: Payer: Self-pay | Admitting: Physician Assistant

## 2021-09-18 DIAGNOSIS — M5136 Other intervertebral disc degeneration, lumbar region: Secondary | ICD-10-CM

## 2021-09-18 DIAGNOSIS — G8929 Other chronic pain: Secondary | ICD-10-CM

## 2021-09-18 DIAGNOSIS — F5101 Primary insomnia: Secondary | ICD-10-CM

## 2021-09-18 DIAGNOSIS — F40240 Claustrophobia: Secondary | ICD-10-CM

## 2021-09-18 DIAGNOSIS — M5441 Lumbago with sciatica, right side: Secondary | ICD-10-CM

## 2021-09-18 DIAGNOSIS — G629 Polyneuropathy, unspecified: Secondary | ICD-10-CM

## 2021-09-24 ENCOUNTER — Encounter: Payer: Self-pay | Admitting: Physician Assistant

## 2021-09-26 ENCOUNTER — Other Ambulatory Visit: Payer: Self-pay | Admitting: Physician Assistant

## 2021-09-26 DIAGNOSIS — Z794 Long term (current) use of insulin: Secondary | ICD-10-CM

## 2021-09-26 DIAGNOSIS — E119 Type 2 diabetes mellitus without complications: Secondary | ICD-10-CM

## 2021-10-20 ENCOUNTER — Other Ambulatory Visit: Payer: Self-pay | Admitting: Physician Assistant

## 2021-10-20 DIAGNOSIS — Z794 Long term (current) use of insulin: Secondary | ICD-10-CM

## 2021-10-20 DIAGNOSIS — E119 Type 2 diabetes mellitus without complications: Secondary | ICD-10-CM

## 2021-10-29 ENCOUNTER — Other Ambulatory Visit: Payer: Self-pay | Admitting: Physician Assistant

## 2021-10-29 ENCOUNTER — Encounter: Payer: Self-pay | Admitting: Physician Assistant

## 2021-10-29 DIAGNOSIS — Z794 Long term (current) use of insulin: Secondary | ICD-10-CM

## 2021-10-29 DIAGNOSIS — E119 Type 2 diabetes mellitus without complications: Secondary | ICD-10-CM

## 2021-11-01 ENCOUNTER — Encounter: Payer: Self-pay | Admitting: Physician Assistant

## 2021-11-15 ENCOUNTER — Other Ambulatory Visit: Payer: Self-pay

## 2021-11-15 ENCOUNTER — Encounter: Payer: Self-pay | Admitting: Physician Assistant

## 2021-11-15 ENCOUNTER — Ambulatory Visit (INDEPENDENT_AMBULATORY_CARE_PROVIDER_SITE_OTHER): Payer: 59 | Admitting: Physician Assistant

## 2021-11-15 VITALS — BP 146/59 | HR 80 | Temp 98.7°F | Wt 273.0 lb

## 2021-11-15 DIAGNOSIS — J432 Centrilobular emphysema: Secondary | ICD-10-CM | POA: Diagnosis not present

## 2021-11-15 DIAGNOSIS — R0602 Shortness of breath: Secondary | ICD-10-CM | POA: Diagnosis not present

## 2021-11-15 DIAGNOSIS — E1165 Type 2 diabetes mellitus with hyperglycemia: Secondary | ICD-10-CM

## 2021-11-15 DIAGNOSIS — J441 Chronic obstructive pulmonary disease with (acute) exacerbation: Secondary | ICD-10-CM

## 2021-11-15 DIAGNOSIS — Z13220 Encounter for screening for lipoid disorders: Secondary | ICD-10-CM

## 2021-11-15 DIAGNOSIS — Z716 Tobacco abuse counseling: Secondary | ICD-10-CM

## 2021-11-15 HISTORY — DX: Tobacco abuse counseling: Z71.6

## 2021-11-15 LAB — POCT GLYCOSYLATED HEMOGLOBIN (HGB A1C): Hemoglobin A1C: 7 % — AB (ref 4.0–5.6)

## 2021-11-15 MED ORDER — NICOTINE 21 MG/24HR TD PT24: 21.0000 mg | MEDICATED_PATCH | Freq: Every day | TRANSDERMAL | 2 refills | Status: DC

## 2021-11-15 MED ORDER — BUDESONIDE-FORMOTEROL FUMARATE 160-4.5 MCG/ACT IN AERO
2.0000 | INHALATION_SPRAY | Freq: Two times a day (BID) | RESPIRATORY_TRACT | 3 refills | Status: DC
Start: 2021-11-15 — End: 2022-05-08

## 2021-11-15 MED ORDER — PREDNISONE 20 MG PO TABS: ORAL_TABLET | ORAL | 0 refills | Status: DC

## 2021-11-15 MED ORDER — AZITHROMYCIN 250 MG PO TABS: ORAL_TABLET | ORAL | 0 refills | Status: DC

## 2021-11-15 NOTE — Patient Instructions (Signed)
Managing the Challenge of Quitting Smoking Quitting smoking is a physical and mental challenge. You will face cravings, withdrawal symptoms, and temptation. Before quitting, work with your health care provider to make a plan that can help you manage quitting. Preparation can help you quit and keep you from giving in. How to manage lifestyle changes Managing stress Stress can make you want to smoke, and wanting to smoke may cause stress. It is important to find ways to manage your stress. You might try some of the following: Practice relaxation techniques. Breathe slowly and deeply, in through your nose and out through your mouth. Listen to music. Soak in a bath or take a shower. Imagine a peaceful place or vacation. Get some support. Talk with family or friends about your stress. Join a support group. Talk with a counselor or therapist. Get some physical activity. Go for a walk, run, or bike ride. Play a favorite sport. Practice yoga.  Medicines Talk with your health care provider about medicines that might help you deal with cravings and make quitting easier for you. Relationships Social situations can be difficult when you are quitting smoking. To manage this, you can: Avoid parties and other social situations where people might be smoking. Avoid alcohol. Leave right away if you have the urge to smoke. Explain to your family and friends that you are quitting smoking. Ask for support and let them know you might be a bit grumpy. Plan activities where smoking is not an option. General instructions Be aware that many people gain weight after they quit smoking. However, not everyone does. To keep from gaining weight, have a plan in place before you quit and stick to the plan after you quit. Your plan should include: Having healthy snacks. When you have a craving, it may help to: Eat popcorn, carrots, celery, or other cut vegetables. Chew sugar-free gum. Changing how you eat. Eat small  portion sizes at meals. Eat 4-6 small meals throughout the day instead of 1-2 large meals a day. Be mindful when you eat. Do not watch television or do other things that might distract you as you eat. Exercising regularly. Make time to exercise each day. If you do not have time for a long workout, do short bouts of exercise for 5-10 minutes several times a day. Do some form of strengthening exercise, such as weight lifting. Do some exercise that gets your heart beating and causes you to breathe deeply, such as walking fast, running, swimming, or biking. This is very important. Drinking plenty of water or other low-calorie or no-calorie drinks. Drink 6-8 glasses of water daily.  How to recognize withdrawal symptoms Your body and mind may experience discomfort as you try to get used to not having nicotine in your system. These effects are called withdrawal symptoms. They may include: Feeling hungrier than normal. Having trouble concentrating. Feeling irritable or restless. Having trouble sleeping. Feeling depressed. Craving a cigarette. To manage withdrawal symptoms: Avoid places, people, and activities that trigger your cravings. Remember why you want to quit. Get plenty of sleep. Avoid coffee and other caffeinated drinks. These may worsen some of your symptoms. These symptoms may surprise you. But be assured that they are normal to have when quitting smoking. How to manage cravings Come up with a plan for how to deal with your cravings. The plan should include the following: A definition of the specific situation you want to deal with. An alternative action you will take. A clear idea for how this action  will help. The name of someone who might help you with this. Cravings usually last for 5-10 minutes. Consider taking the following actions to help you with your plan to deal with cravings: Keep your mouth busy. Chew sugar-free gum. Suck on hard candies or a straw. Brush your  teeth. Keep your hands and body busy. Change to a different activity right away. Squeeze or play with a ball. Do an activity or a hobby, such as making bead jewelry, practicing needlepoint, or working with wood. Mix up your normal routine. Take a short exercise break. Go for a quick walk or run up and down stairs. Focus on doing something kind or helpful for someone else. Call a friend or family member to talk during a craving. Join a support group. Contact a quitline. Where to find support To get help or find a support group: Call the [facility]'s Smoking Quitline: 1-800-QUIT NOW (682) 655-7828) Visit the website of the Substance Abuse and [place]: ktimeonline.com Text QUIT to SmokefreeTXT: 676720 Where to find more information Visit these websites to find more information on quitting smoking: [place]: www.smokefree.gov American Lung Association: www.lung.org American Cancer Society: www.cancer.org Centers for Disease Control and Prevention: http://www.wolf.info/ American Heart Association: www.heart.org Contact a health care provider if: You want to change your plan for quitting. The medicines you are taking are not helping. Your eating feels out of control or you cannot sleep. Get help right away if: You feel depressed or become very anxious. Summary Quitting smoking is a physical and mental challenge. You will face cravings, withdrawal symptoms, and temptation to smoke again. Preparation can help you as you go through these challenges. Try different techniques to manage stress, handle social situations, and prevent weight gain. You can deal with cravings by keeping your mouth busy (such as by chewing gum), keeping your hands and body busy, calling family or friends, or contacting a quitline for people who want to quit smoking. You can deal with withdrawal symptoms by avoiding places where people smoke, getting plenty of rest, and  avoiding drinks with caffeine. This information is not intended to replace advice given to you by your health care provider. Make sure you discuss any questions you have with your health care provider. Document Revised: 08/23/2021 Document Reviewed: 10/04/2019 Elsevier Patient Education  2022 [place] [employer]. Chronic Obstructive Pulmonary Disease Exacerbation Chronic obstructive pulmonary disease (COPD) is a long-term (chronic) condition that affects the lungs. COPD is a general term that can be used to describe many different lung problems that cause lung inflammation and limit airflow, including chronic bronchitis and emphysema. COPD exacerbations are episodes when breathing symptoms flare up, become much worse, and require extra treatment. COPD exacerbations are usually caused by infections. Without treatment, COPD exacerbations can be severe and even life threatening. Frequent COPD exacerbations can cause further damage to the lungs. What are the causes? This condition may be caused by: Respiratory infections, including viral and bacterial infections. Exposure to smoke. Exposure to air pollution, chemical fumes, or dust. Things that can cause an allergic reaction (allergens). Not taking your usual COPD medicines as directed. Underlying medical problems, such as congestive heart failure or infections not involving the lungs. In many cases, the cause of this condition is not known. What increases the risk? The following factors may make you more likely to develop this condition: Smoking cigarettes. Being an older adult. Having frequent prior COPD exacerbations. What are the signs or symptoms? Symptoms of this condition include: Increased coughing.  Increased production of mucus from your lungs. Increased wheezing and shortness of breath. Rapid or labored breathing. Chest tightness. Less energy than usual. Sleep disruption from symptoms. Confusion Increased sleepiness. Often, these  symptoms happen or get worse even with the use of medicines. How is this diagnosed? This condition is diagnosed based on: Your medical history. A physical exam. You may also have tests, including: A chest X-ray. Blood tests. Lung (pulmonary) function tests. How is this treated? Treatment for this condition depends on the severity and cause of the symptoms. You may need to be admitted to a hospital for treatment. Some of the treatments commonly used to treat COPD exacerbations are: Antibiotic medicines. These may be used for severe exacerbations caused by a lung infection, such as pneumonia. Bronchodilators. These are inhaled medicines that expand the air passages and allow increased airflow. They may make your breathing more comfortable. Steroid medicines. These act to reduce inflammation in the airways. They may be given with an inhaler, taken by mouth, or given through an IV tube inserted into one of your veins. Supplemental oxygen therapy. Airway clearing techniques, such as noninvasive ventilation (NIV) and positive expiratory pressure (PEP). These provide respiratory support through a mask or other noninvasive device. An example of this would be using a continuous positive airway pressure (CPAP) machine to improve delivery of oxygen into your lungs. Follow these instructions at home: Medicines Take over-the-counter and prescription medicines only as told by your health care provider. It is important to use correct technique with inhaled medicines. If you were prescribed an antibiotic medicine or oral steroid, take it as told by your health care provider. Do not stop taking the medicine even if you start to feel better. Lifestyle Do not use any products that contain nicotine or tobacco. These products include cigarettes, chewing tobacco, and vaping devices, such as e-cigarettes. If you need help quitting, ask your health care provider. Eat a healthy diet. Exercise regularly. Get enough  sleep. Most adults need 7 or more hours per night. Avoid exposure to all substances that irritate the airway, especially tobacco smoke. Regularly wash your hands with soap and water for at least 20 seconds. If soap and water are not available, use hand sanitizer. This may help prevent you from getting infections. During flu season, avoid enclosed spaces that are crowded with people. General instructions Drink enough fluid to keep your urine pale yellow, unless you have a medical condition that requires fluid restriction. Use a cool mist vaporizer. This humidifies the air and makes it easier for you to clear your chest when you cough. If you have a home nebulizer and oxygen, continue to use them as told by your health care provider. Keep all follow-up visits. This is important. How is this prevented? Stay up-to-date on pneumococcal and flu (influenza) vaccines. A flu shot is recommended every year to help prevent exacerbations. Quitting smoking is very important in preventing COPD from getting worse and in preventing exacerbations from happening as often. Follow all instructions for pulmonary rehabilitation after a recent exacerbation. This can help prevent future exacerbations. Work with your health care provider to develop and follow an action plan. This tells you what steps to take when you experience certain symptoms. Contact a health care provider if: You have a worsening of your regular COPD symptoms. Get help right away if: You have worsening shortness of breath, even when resting. You have trouble talking. You have severe chest pain. You cough up blood. You have a fever. You have  weakness, vomit repeatedly, or faint. You feel confused. You are not able to sleep because of your symptoms. You have trouble doing daily activities. These symptoms may represent a serious problem that is an emergency. Do not wait to see if the symptoms will go away. Get medical help right away. Call your  local emergency services (911 in the [place] Do not drive yourself to the hospital. Summary COPD exacerbations are episodes when breathing symptoms become much worse and require extra treatment above your normal treatment. Exacerbations can be severe and even life threatening. Frequent COPD exacerbations can cause further damage to your lungs. COPD exacerbations are usually triggered by infections such as the flu, colds, and even pneumonia. Treatment for this condition depends on the severity and cause of the symptoms. You may need to be admitted to a hospital for treatment. Quitting smoking is very important to prevent COPD from getting worse and to prevent exacerbations from happening as often. This information is not intended to replace advice given to you by your health care provider. Make sure you discuss any questions you have with your health care provider. Document Revised: 10/23/2020 Document Reviewed: 10/23/2020 Elsevier Patient Education  2022 [employer].

## 2021-11-15 NOTE — Progress Notes (Signed)
Established Patient Office Visit  Subjective:  Patient ID: Kathleen Le, female    DOB: [birth year]  Age: 60 y.o. MRN: [id number]  CC:  Chief Complaint  Patient presents with   Diabetes    HPI Pt is a 60 y.o female with a history of COPD, HTN, T2DM, and GERD presenting to the clinic for 90-month follow-up. Pt reports worsening shortness of breath over the past month, with the inability to walk to the mailbox and back without trouble breathing. Pt reports having to sleep on multiple pillows every night in order to breathe. Reports persistent cough with mucous production. Continues to smoke 1 PPD. Pt states she is able to routinely check blood glucose due to new insurance coverage, avg AM glucose is 120 mg/dL. Reports chronic back pain has been alleviated with bike riding but is limited by her shortness of breath. Positive for neuropathy but denies chest pain, palpitations, peripheral edema, visual changes, or hypoglycemic episodes.   Past Medical History:  Diagnosis Date   Asthma    Diabetes mellitus without complication ([place])    History of alcohol abuse    History of marijuana use    Hypertension    Substance abuse ([place])     Past Surgical History:  Procedure Laterality Date   DNC      Family History  Problem Relation Age of Onset   Cancer Paternal Grandmother    Diabetes Paternal Grandmother    Diabetes Paternal Grandfather    Heart attack Maternal Aunt    Cancer Maternal Uncle    Cancer Maternal Grandmother    Heart attack Mother    Cancer Mother    Stroke Mother    Diabetes Father    Heart attack Father    Diabetes Paternal Uncle    Diabetes Paternal Uncle     Social History   Socioeconomic History   Marital status: Married    Spouse name: Not on file   Number of children: Not on file   Years of education: Not on file   Highest education level: Not on file  Occupational History   Not on file  Tobacco Use   Smoking status: Former   Smokeless tobacco: Never   Substance and Sexual Activity   Alcohol use: No   Drug use: No   Sexual activity: Yes  Other Topics Concern   Not on file  Social History Narrative   Not on file   Social Determinants of Health   Financial Resource Strain: Not on file  Food Insecurity: Not on file  Transportation Needs: Not on file  Physical Activity: Not on file  Stress: Not on file  Social Connections: Not on file  Intimate Partner Violence: Not on file    Outpatient Medications Prior to Visit  Medication Sig Dispense Refill   albuterol (PROVENTIL) (2.5 MG/3ML) 0.083% nebulizer solution USE 1 VIAL IN NEBULIZER EVERY 4 HOURS AS NEEDED FOR WHEEZING OR SHORTNESS OF BREATH. 270 mL 5   albuterol (VENTOLIN HFA) 108 (90 Base) MCG/ACT inhaler Inhale 2 puffs into the lungs every 6 (six) hours as needed. 18 g 2   AMBULATORY NON FORMULARY MEDICATION Blood sugar testing strips and lancets for TrueTrack glucometer.  Use to check blood sugar up to two times a day.  Dx: Type 2 Diabetes 100 Units 4   AMBULATORY NON FORMULARY MEDICATION Please provide needles for Tresiba pens 100 each 3   amLODipine (NORVASC) 5 MG tablet Take 1 tablet (5 mg total) by mouth daily. 90 tablet  1   aspirin 81 MG tablet Take 81 mg by mouth daily.     atenolol (TENORMIN) 50 MG tablet Take 1 tablet by mouth once daily 90 tablet 1   dapagliflozin propanediol (FARXIGA) 10 MG TABS tablet Take 1 tablet (10 mg total) by mouth daily. 90 tablet 3   doxepin (SINEQUAN) 25 MG capsule TAKE 2 CAPSULES BY MOUTH AT BEDTIME AS NEEDED 180 capsule 0   DULoxetine (CYMBALTA) 30 MG capsule Take 1 capsule by mouth twice daily 180 capsule 0   esomeprazole (NEXIUM) 20 MG capsule Take 20 mg by mouth daily at 12 noon.     Flaxseed, Linseed, (FLAX SEED OIL) 1000 MG CAPS Take 1,000 mg by mouth 2 (two) times daily.     glucose blood test strip Use up to 4 times per day as directed with glucometer. Disp: 100. Refill x99 please dispense brand per insurance coverage/patient  preference 300 strip 99   hydrochlorothiazide (HYDRODIURIL) 25 MG tablet Take 1 tablet by mouth once daily 90 tablet 0   insulin NPH Human (NOVOLIN N RELION) 100 UNIT/ML injection Inject 0.4 mLs (40 Units total) into the skin at bedtime. 10 mL 3   Insulin Syringe-Needle U-100 (INSULIN SYRINGE .3CC/31GX5/16") 31G X 5/16" 0.3 ML MISC Dx DM E11.9. Inject insulin daily at bedtime. 10 each prn   Lancets 30G MISC Check blood sugar up to 4 times a day. 100 each PRN   lisinopril (ZESTRIL) 5 MG tablet Take 1 tablet by mouth once daily 90 tablet 1   meloxicam (MOBIC) 15 MG tablet Take 1 tablet by mouth once daily 90 tablet 1   metFORMIN (GLUCOPHAGE) 1000 MG tablet TAKE 1 TABLET BY MOUTH TWICE DAILY WITH MEALS 180 tablet 0   Omega-3 Fatty Acids (FISH OIL) 1000 MG CAPS Take 1,000 mg by mouth 2 (two) times daily.     pioglitazone (ACTOS) 30 MG tablet Take 1 tablet (30 mg total) by mouth daily. 90 tablet 3   Probiotic Product (PROBIOTIC DAILY PO) Take by mouth daily.     rosuvastatin (CRESTOR) 20 MG tablet Take 1 tablet (20 mg total) by mouth daily. 90 tablet 3   sitaGLIPtin (JANUVIA) 100 MG tablet Take 1 tablet (100 mg total) by mouth daily. 90 tablet 3   glipiZIDE (GLUCOTROL) 10 MG tablet Take 1 tablet (10 mg total) by mouth 2 (two) times daily before a meal. 180 tablet 1   umeclidinium-vilanterol (ANORO ELLIPTA) 62.5-25 MCG/INH AEPB Inhale 1 puff into the lungs daily. 1 each 11   No facility-administered medications prior to visit.    Allergies  Allergen Reactions   Gabapentin Nausea And Vomiting    Nauseated/feeling sick   Invokana [Canagliflozin]     Vaginitis    Lyrica [Pregabalin] Other (See Comments)    Pain in feet increased and spasm    ROS Review of Systems    Objective:    Physical Exam Vitals reviewed.  Constitutional:      Appearance: Normal appearance. She is obese.  HENT:     Head: Normocephalic.  Neck:     Vascular: No carotid bruit.  Cardiovascular:     Rate and  Rhythm: Normal rate and regular rhythm.     Pulses: Normal pulses.     Heart sounds: Normal heart sounds.  Pulmonary:     Comments: Labored breathing/coarse breath sounds/wheezing throughout both lungs.  Lymphadenopathy:     Cervical: No cervical adenopathy.  Neurological:     General: No focal deficit present.  Mental Status: She is alert and oriented to person, place, and time.  Psychiatric:        Mood and Affect: Mood normal.    BP (!) 146/59 (BP Location: Right Arm, Patient Position: Sitting, Cuff Size: Large)   Pulse 80   Temp 98.7 F (37.1 C) (Oral)   Wt 273 lb (123.8 kg)   SpO2 94%   BMI 44.06 kg/m  Wt Readings from Last 3 Encounters:  11/15/21 273 lb (123.8 kg)  08/16/21 270 lb (122.5 kg)  02/13/21 270 lb (122.5 kg)     Health Maintenance Due  Topic Date Due   HIV Screening  Never done   Zoster Vaccines- Shingrix (1 of 2) Never done   PAP SMEAR-Modifier  01/24/2018   .[name] Results for orders placed or performed in visit on 11/15/21  POCT HgB A1C  Result Value Ref Range   Hemoglobin A1C 7.0 (A) 4.0 - 5.6 %   HbA1c POC (<> result, manual entry)     HbA1c, POC (prediabetic range)     HbA1c, POC (controlled diabetic range)          Assessment & Plan:   [name] KitchenTamala was seen today for diabetes.  Diagnoses and all orders for this visit:  COPD exacerbation ([place]) -     predniSONE (DELTASONE) 20 MG tablet; Take 3 tablets for 3 days, take 2 tablet for 3 days, take 1 tablet for 3 days, take 1/2 tablets for 4 days. -     azithromycin (ZITHROMAX Z-PAK) 250 MG tablet; Take 2 tablets (500 mg) on  Day 1,  followed by 1 tablet (250 mg) once daily on Days 2 through 5.  Uncontrolled type 2 diabetes mellitus with hyperglycemia ([facility]) -     POCT HgB A1C -     COMPLETE METABOLIC PANEL WITH GFR -     Lipid Panel w/reflex Direct LDL -     TSH -     CBC with Differential/Platelet  Shortness of breath  Screening for lipid disorders -     Lipid Panel w/reflex Direct  LDL  Encounter for smoking cessation counseling -     nicotine (NICODERM CQ) 21 mg/24hr patch; Place 1 patch (21 mg total) onto the skin daily.  Centrilobular emphysema ([facility]) -     budesonide-formoterol (SYMBICORT) 160-4.5 MCG/ACT inhaler; Inhale 2 puffs into the lungs 2 (two) times daily.  COPD exacerbation treated with zpak and prednisone.  Continue symbicort bid.  Albuterol as needed.  Discussed smoking cessation.  Sent patches discussed tapering down slowly. Goal is to be stopped smoking on 21mg  patch in 3 months.  Follow up as needed.   a1C improving.  Continue same medications.  On statin.  BP not to goal but coughing and SOB today.   Follow-up: Return in about 3 months (around 02/15/2022).    Iran Planas, PA-C

## 2021-11-15 NOTE — Progress Notes (Deleted)
Able to get strips now due to insurance coverage, blood glucose normally around 120 in the mornings.  Reports having increased trouble with breathing, trouble walking to mailbox and back without SOB. Has to sleep with multiple pillows. No SOB upon rest. Started riding bike which has helped with back pain but limited by SOB. Positive for neuropathy and chronic cough but denies chest pain, palpitations, or visual changes.

## 2021-11-17 ENCOUNTER — Other Ambulatory Visit: Payer: Self-pay | Admitting: Physician Assistant

## 2021-11-17 DIAGNOSIS — E119 Type 2 diabetes mellitus without complications: Secondary | ICD-10-CM

## 2021-11-17 DIAGNOSIS — Z794 Long term (current) use of insulin: Secondary | ICD-10-CM

## 2021-11-18 ENCOUNTER — Encounter: Payer: Self-pay | Admitting: Physician Assistant

## 2021-11-20 ENCOUNTER — Other Ambulatory Visit: Payer: Self-pay | Admitting: Physician Assistant

## 2021-11-20 ENCOUNTER — Encounter: Payer: Self-pay | Admitting: Physician Assistant

## 2021-12-02 ENCOUNTER — Encounter: Payer: Self-pay | Admitting: Physician Assistant

## 2021-12-06 ENCOUNTER — Other Ambulatory Visit: Payer: Self-pay | Admitting: Physician Assistant

## 2021-12-06 ENCOUNTER — Encounter: Payer: Self-pay | Admitting: Physician Assistant

## 2021-12-06 MED ORDER — NYSTATIN 100000 UNIT/ML MT SUSP: 5.0000 mL | Freq: Four times a day (QID) | OROMUCOSAL | 0 refills | Status: DC

## 2021-12-06 NOTE — Progress Notes (Signed)
Nystatin mouthwash sent.

## 2021-12-10 ENCOUNTER — Encounter: Payer: Self-pay | Admitting: Physician Assistant

## 2021-12-10 DIAGNOSIS — R053 Chronic cough: Secondary | ICD-10-CM

## 2021-12-10 DIAGNOSIS — R0602 Shortness of breath: Secondary | ICD-10-CM

## 2021-12-12 ENCOUNTER — Other Ambulatory Visit: Payer: Self-pay

## 2021-12-12 ENCOUNTER — Ambulatory Visit (INDEPENDENT_AMBULATORY_CARE_PROVIDER_SITE_OTHER): Payer: 59

## 2021-12-12 DIAGNOSIS — R053 Chronic cough: Secondary | ICD-10-CM | POA: Diagnosis not present

## 2021-12-12 DIAGNOSIS — R059 Cough, unspecified: Secondary | ICD-10-CM | POA: Diagnosis not present

## 2021-12-12 DIAGNOSIS — R0602 Shortness of breath: Secondary | ICD-10-CM

## 2021-12-12 IMAGING — DX DG CHEST 2V
2 series · 2 of 2 positions shown · non-contrast
Comparison: None.

CLINICAL DATA: Worsening cough and shortness of breath.

EXAM:
CHEST - 2 VIEW

[chest pa]
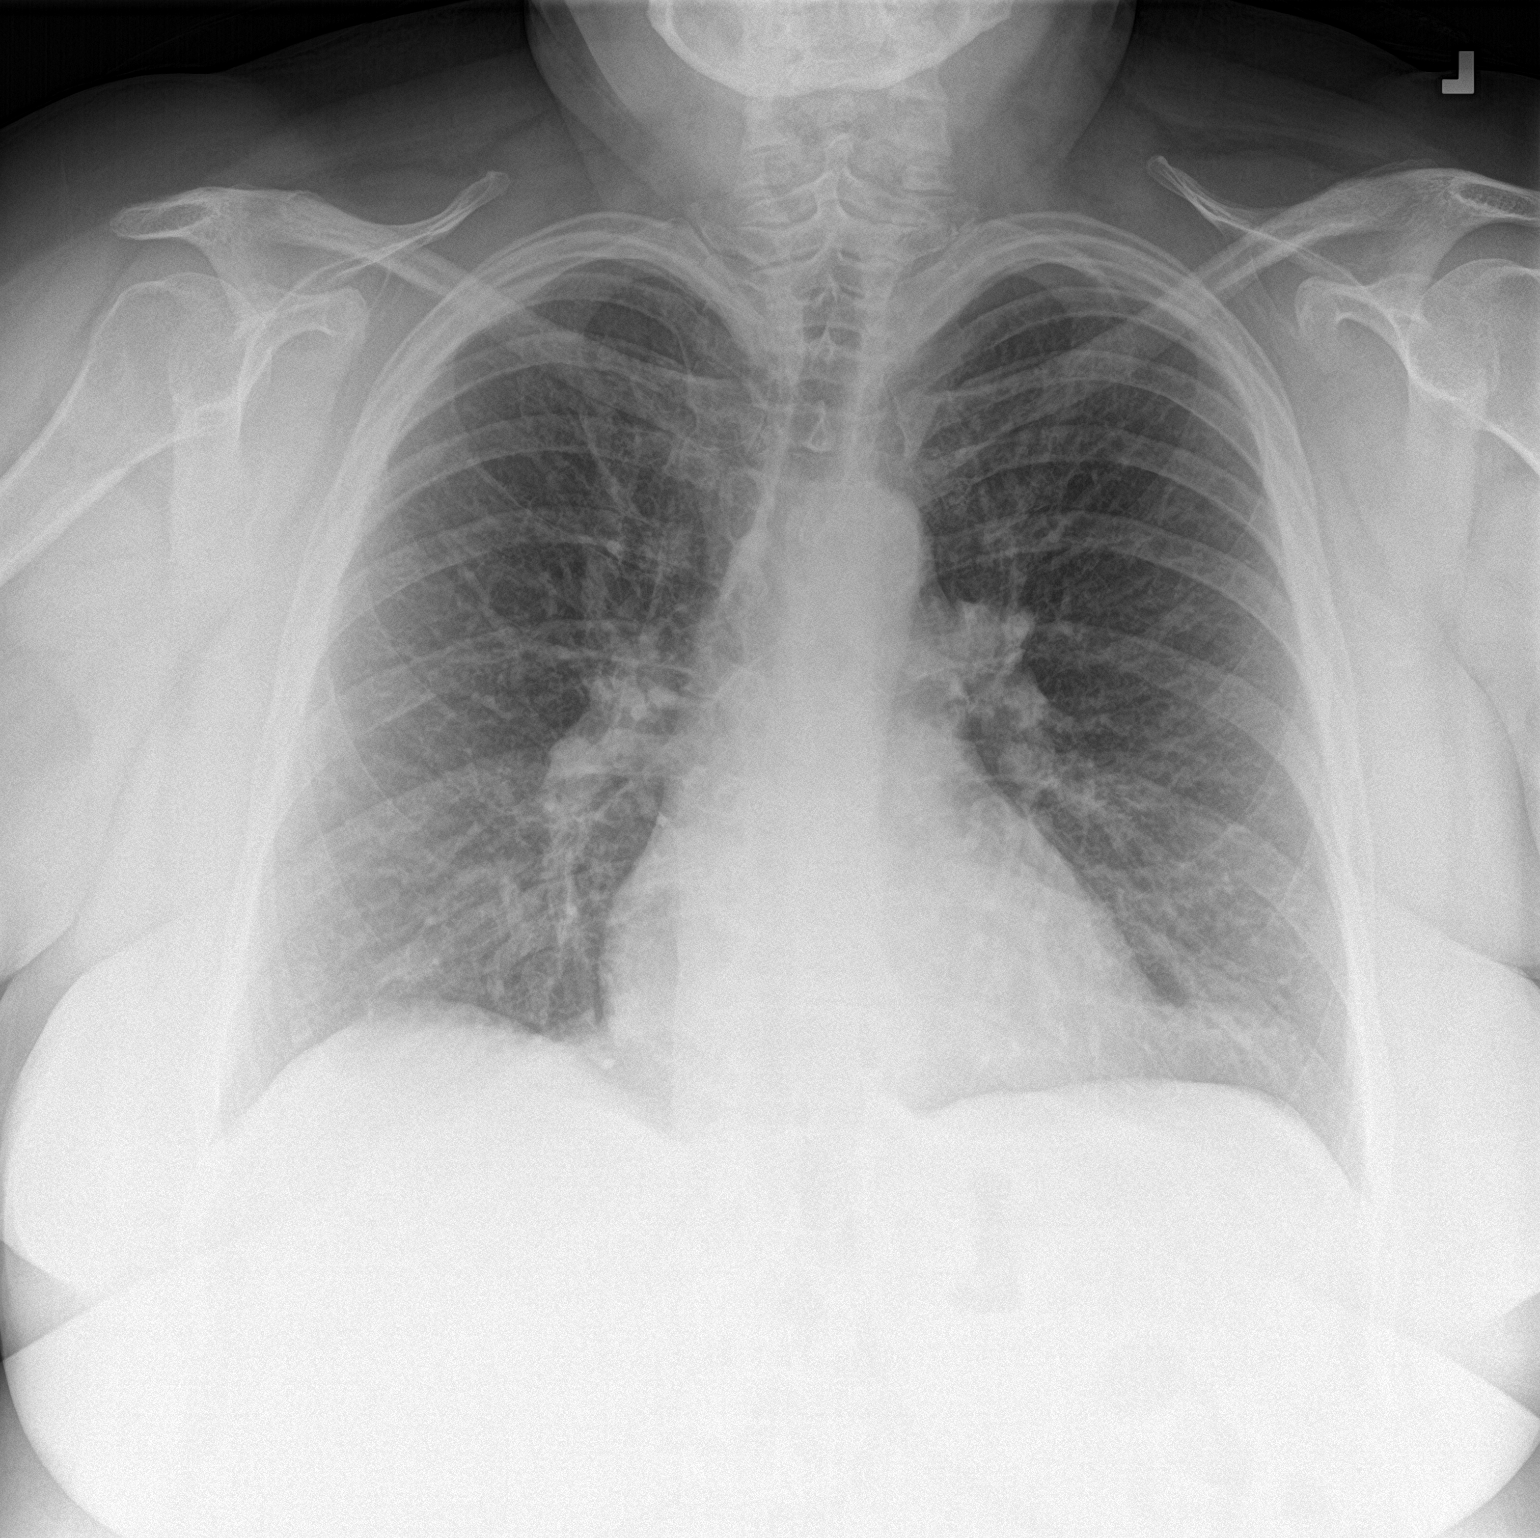

[chest lat]
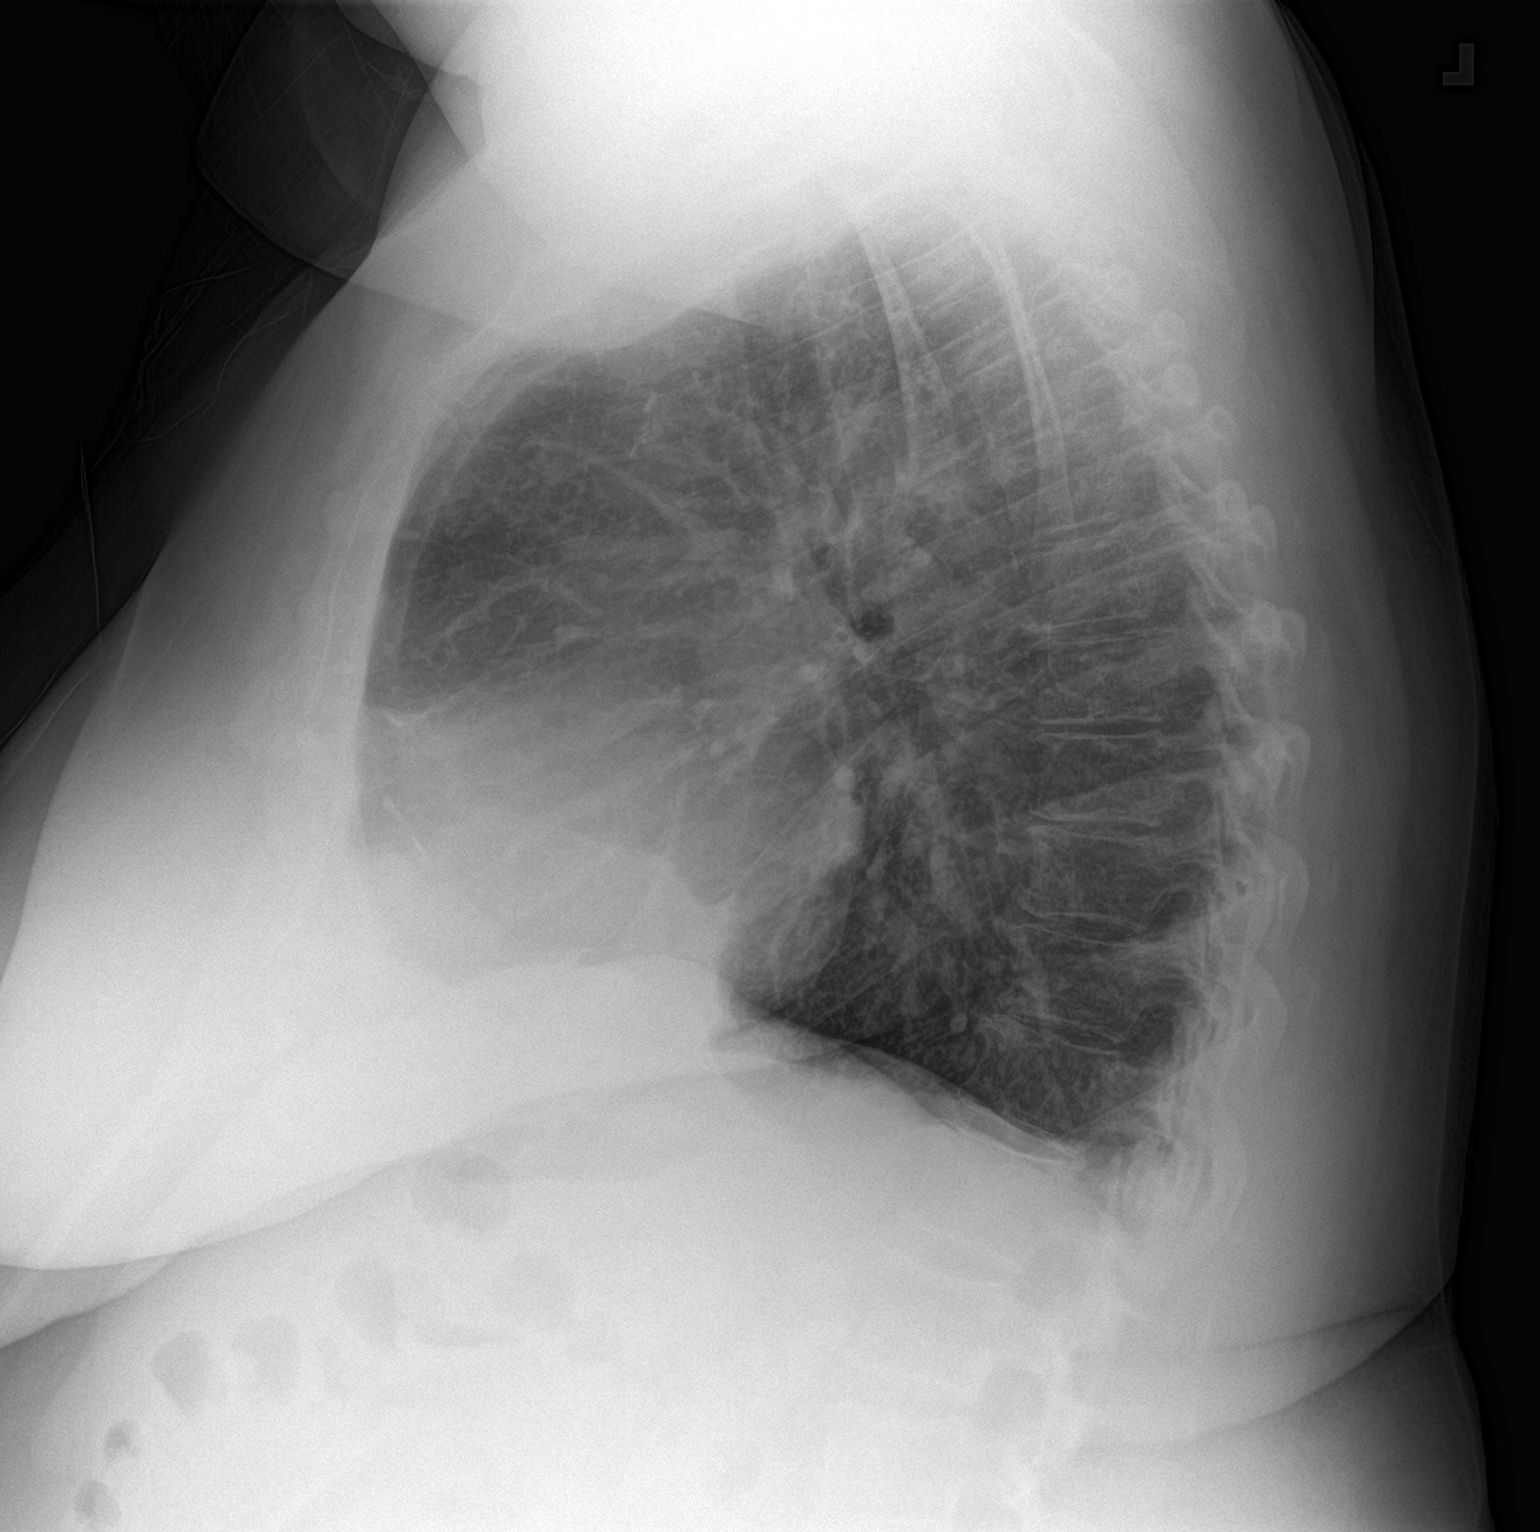

[2 of 2 positions shown; findings below may reference images not displayed]

FINDINGS: The cardiomediastinal silhouette is within normal limits. The lungs
are mildly hyperinflated. There is moderate premarked no thickening.
No focal airspace consolidation, overt pulmonary edema, sizable
pleural effusion, or pneumothorax is identified. No acute osseous
abnormality is seen.
IMPRESSION: Bronchitic changes/COPD.

## 2021-12-13 ENCOUNTER — Encounter: Payer: Self-pay | Admitting: Physician Assistant

## 2021-12-16 ENCOUNTER — Other Ambulatory Visit: Payer: Self-pay | Admitting: Physician Assistant

## 2021-12-16 ENCOUNTER — Telehealth: Payer: Self-pay

## 2021-12-16 ENCOUNTER — Encounter: Payer: Self-pay | Admitting: Physician Assistant

## 2021-12-16 DIAGNOSIS — I10 Essential (primary) hypertension: Secondary | ICD-10-CM

## 2021-12-16 DIAGNOSIS — J441 Chronic obstructive pulmonary disease with (acute) exacerbation: Secondary | ICD-10-CM

## 2021-12-16 MED ORDER — PREDNISONE 20 MG PO TABS: ORAL_TABLET | ORAL | 0 refills | Status: DC

## 2021-12-16 NOTE — Progress Notes (Signed)
No pneumonia. Bronchitic changes and known COPD. Sent another prednisone taper.

## 2021-12-16 NOTE — Telephone Encounter (Signed)
Medication: sitaGLIPtin (JANUVIA) 100 MG tablet Prior authorization submitted via CoverMyMeds on 12/16/2021 [place] submission pending

## 2021-12-16 NOTE — Telephone Encounter (Signed)
Iran Planas sent a MyChart message with results.  Charyl Bigger, CMA

## 2021-12-18 ENCOUNTER — Other Ambulatory Visit: Payer: Self-pay | Admitting: Physician Assistant

## 2021-12-18 DIAGNOSIS — I10 Essential (primary) hypertension: Secondary | ICD-10-CM

## 2021-12-18 NOTE — Telephone Encounter (Signed)
Medication: sitaGLIPtin (JANUVIA) 100 MG tablet Prior authorization determination received Medication has been approved Approval dates: 12/17/2021-12/17/2022  Patient aware via: [place] aware: Yes Provider aware via this encounter

## 2021-12-18 NOTE — Telephone Encounter (Signed)
Sent to pharmacy on 12/16/2021

## 2021-12-24 ENCOUNTER — Other Ambulatory Visit: Payer: Self-pay | Admitting: Physician Assistant

## 2021-12-24 DIAGNOSIS — F5101 Primary insomnia: Secondary | ICD-10-CM

## 2021-12-24 DIAGNOSIS — F40240 Claustrophobia: Secondary | ICD-10-CM

## 2021-12-25 ENCOUNTER — Emergency Department (INDEPENDENT_AMBULATORY_CARE_PROVIDER_SITE_OTHER): Payer: 59

## 2021-12-25 ENCOUNTER — Other Ambulatory Visit: Payer: Self-pay

## 2021-12-25 ENCOUNTER — Emergency Department (INDEPENDENT_AMBULATORY_CARE_PROVIDER_SITE_OTHER)
Admission: EM | Admit: 2021-12-25 | Discharge: 2021-12-25 | Disposition: A | Discharge status: Home / Self Care | Payer: 59 | Source: Home / Self Care | Attending: Emergency Medicine | Admitting: Emergency Medicine

## 2021-12-25 DIAGNOSIS — W228XXA Striking against or struck by other objects, initial encounter: Secondary | ICD-10-CM

## 2021-12-25 DIAGNOSIS — M7989 Other specified soft tissue disorders: Secondary | ICD-10-CM | POA: Diagnosis not present

## 2021-12-25 DIAGNOSIS — S4992XA Unspecified injury of left shoulder and upper arm, initial encounter: Secondary | ICD-10-CM | POA: Diagnosis not present

## 2021-12-25 DIAGNOSIS — M545 Low back pain, unspecified: Secondary | ICD-10-CM

## 2021-12-25 DIAGNOSIS — M4316 Spondylolisthesis, lumbar region: Secondary | ICD-10-CM | POA: Diagnosis not present

## 2021-12-25 DIAGNOSIS — M25522 Pain in left elbow: Secondary | ICD-10-CM

## 2021-12-25 DIAGNOSIS — M5137 Other intervertebral disc degeneration, lumbosacral region: Secondary | ICD-10-CM | POA: Diagnosis not present

## 2021-12-25 DIAGNOSIS — S5002XA Contusion of left elbow, initial encounter: Secondary | ICD-10-CM | POA: Diagnosis not present

## 2021-12-25 DIAGNOSIS — M79602 Pain in left arm: Secondary | ICD-10-CM

## 2021-12-25 DIAGNOSIS — M25422 Effusion, left elbow: Secondary | ICD-10-CM | POA: Diagnosis not present

## 2021-12-25 IMAGING — DX DG HUMERUS 2V *L*
4 series · 4 of 4 positions shown · non-contrast
Comparison: None.

CLINICAL DATA: Fall.  Left arm injury.

EXAM:
LEFT HUMERUS - 2+ VIEW

[humerus ap (1 of 2)]
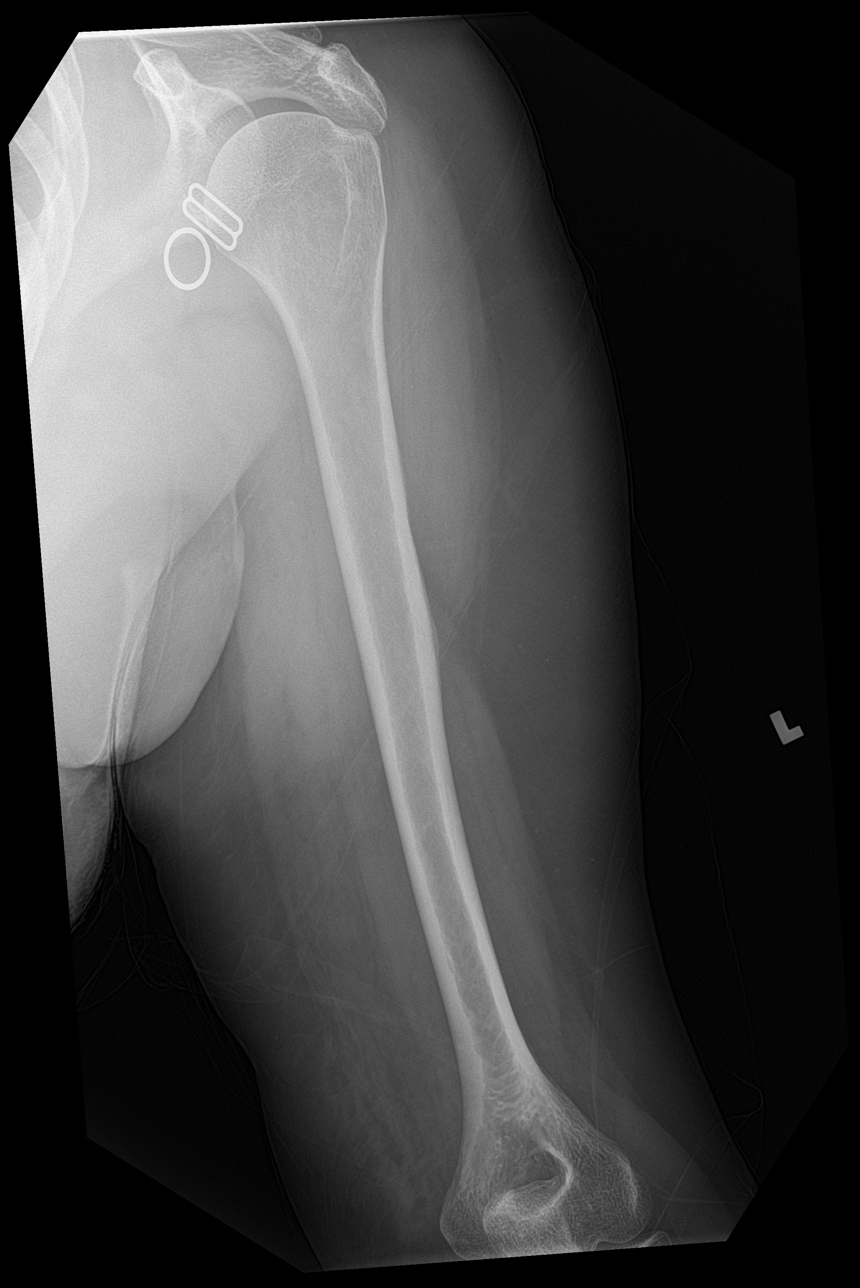

[humerus lat (1 of 2)]
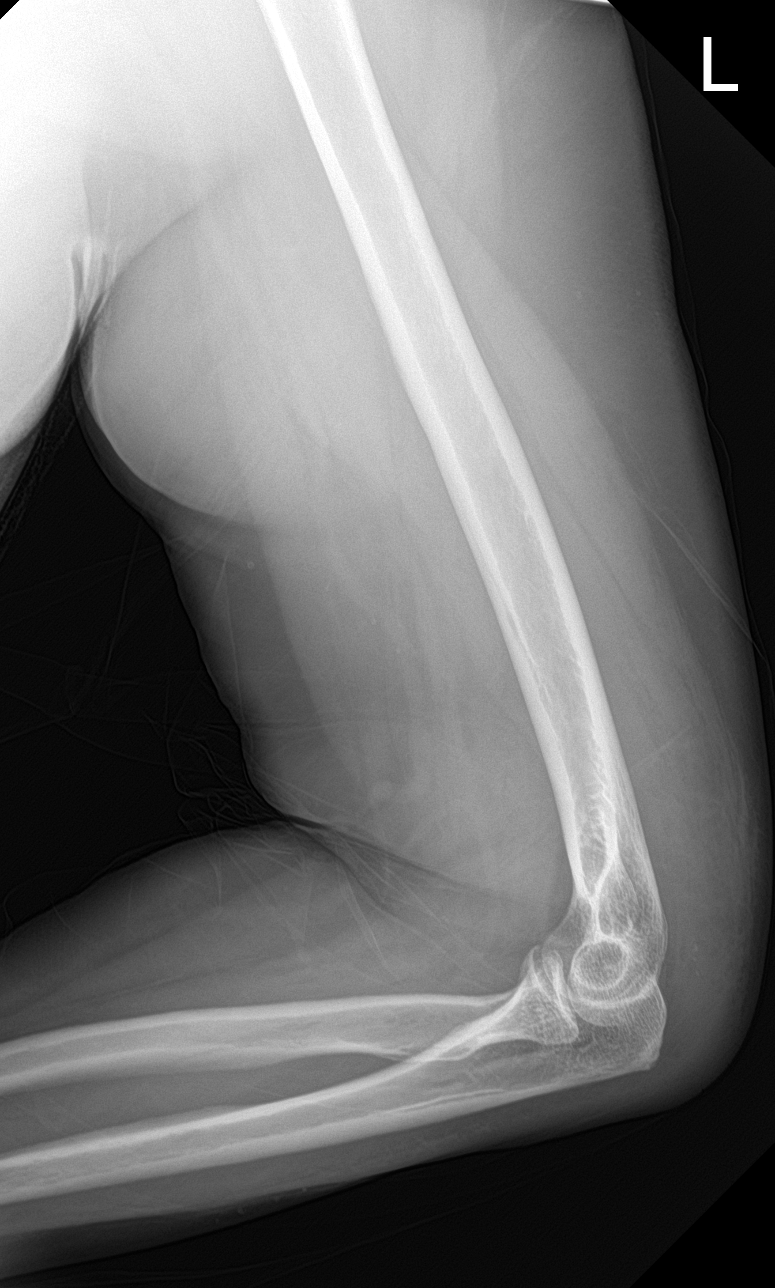

[humerus ap (2 of 2)]
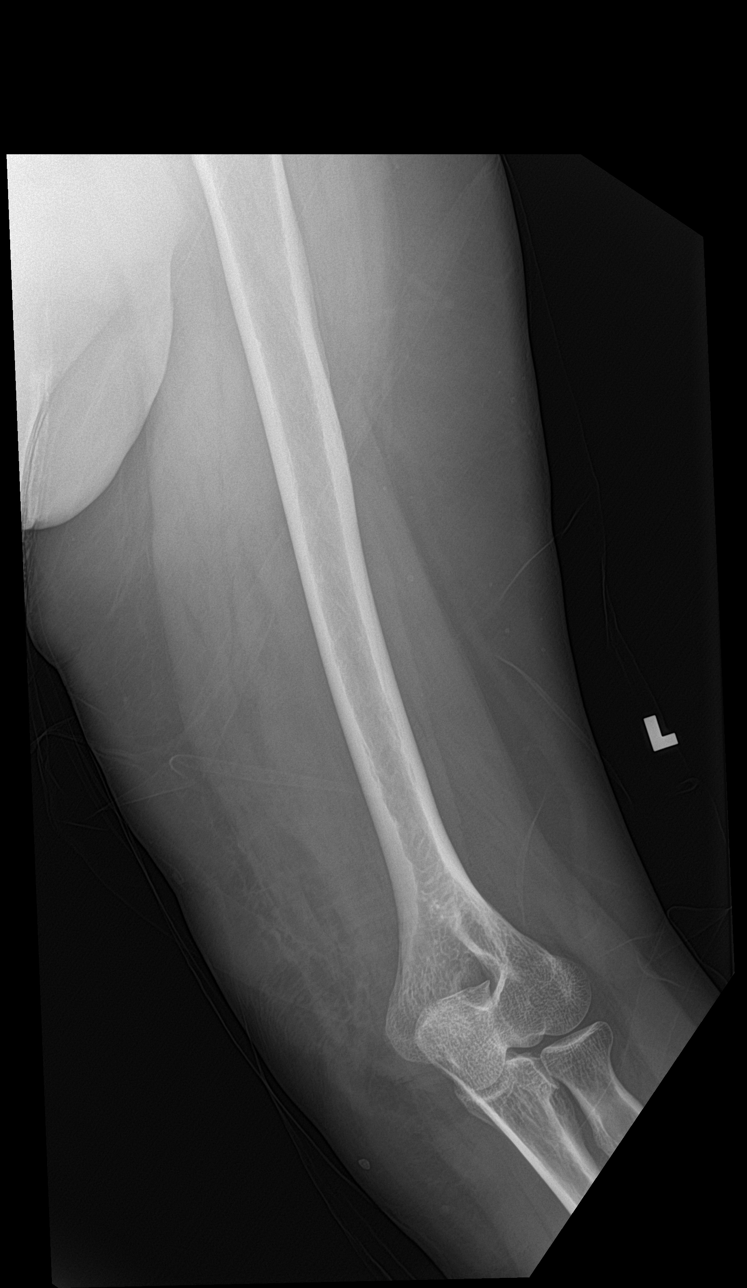

[humerus lat (2 of 2)]
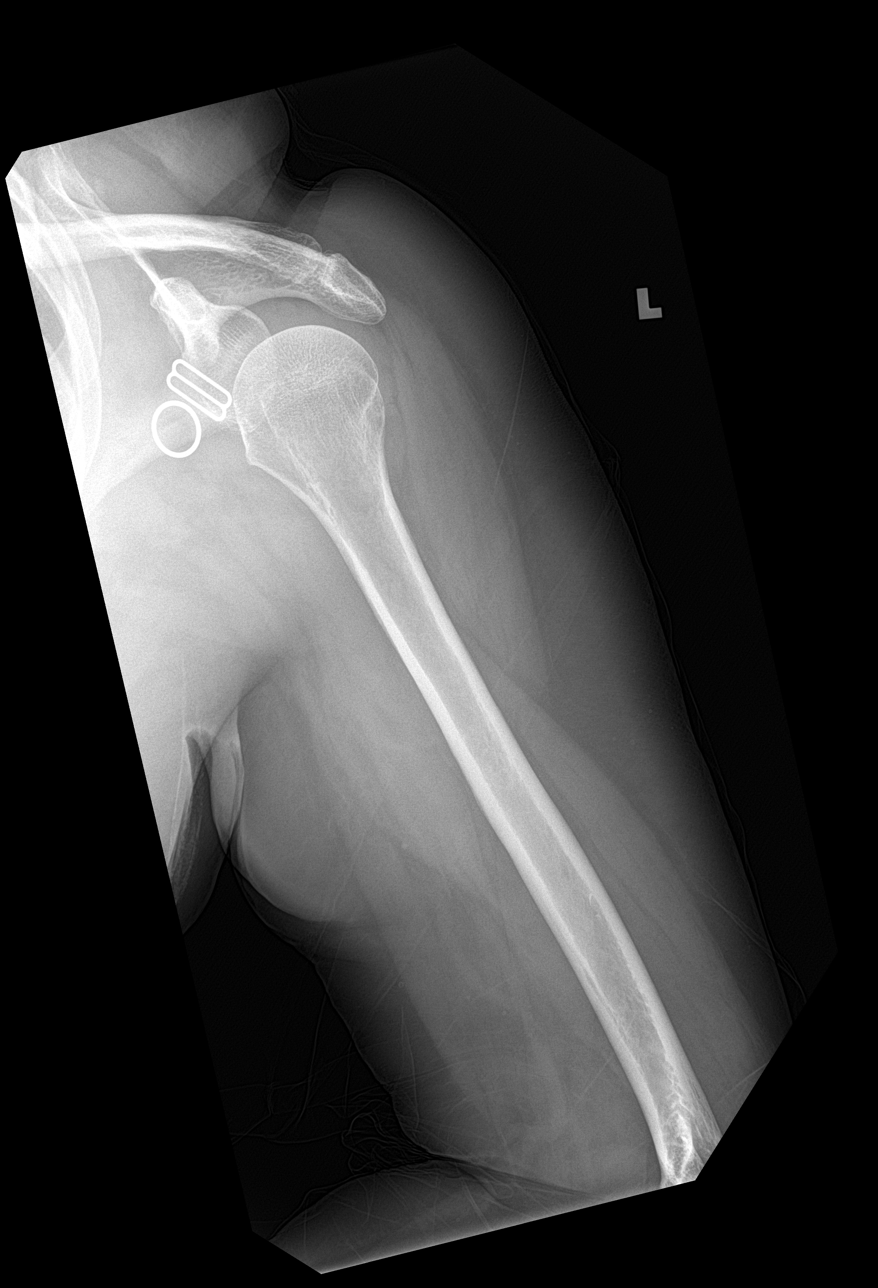

[4 of 4 positions shown; findings below may reference images not displayed]

FINDINGS: There is no evidence of fracture or other focal bone lesions. Soft
tissues are unremarkable.
IMPRESSION: Negative.

## 2021-12-25 IMAGING — DX DG ELBOW COMPLETE 3+V*L*
4 series · 4 of 4 positions shown · non-contrast
Comparison: Left humerus [DATE]

CLINICAL DATA: Bruising and effusion after fall trauma. Trip and
fall injury.

EXAM:
LEFT ELBOW - COMPLETE 3+ VIEW

[elbow ap]
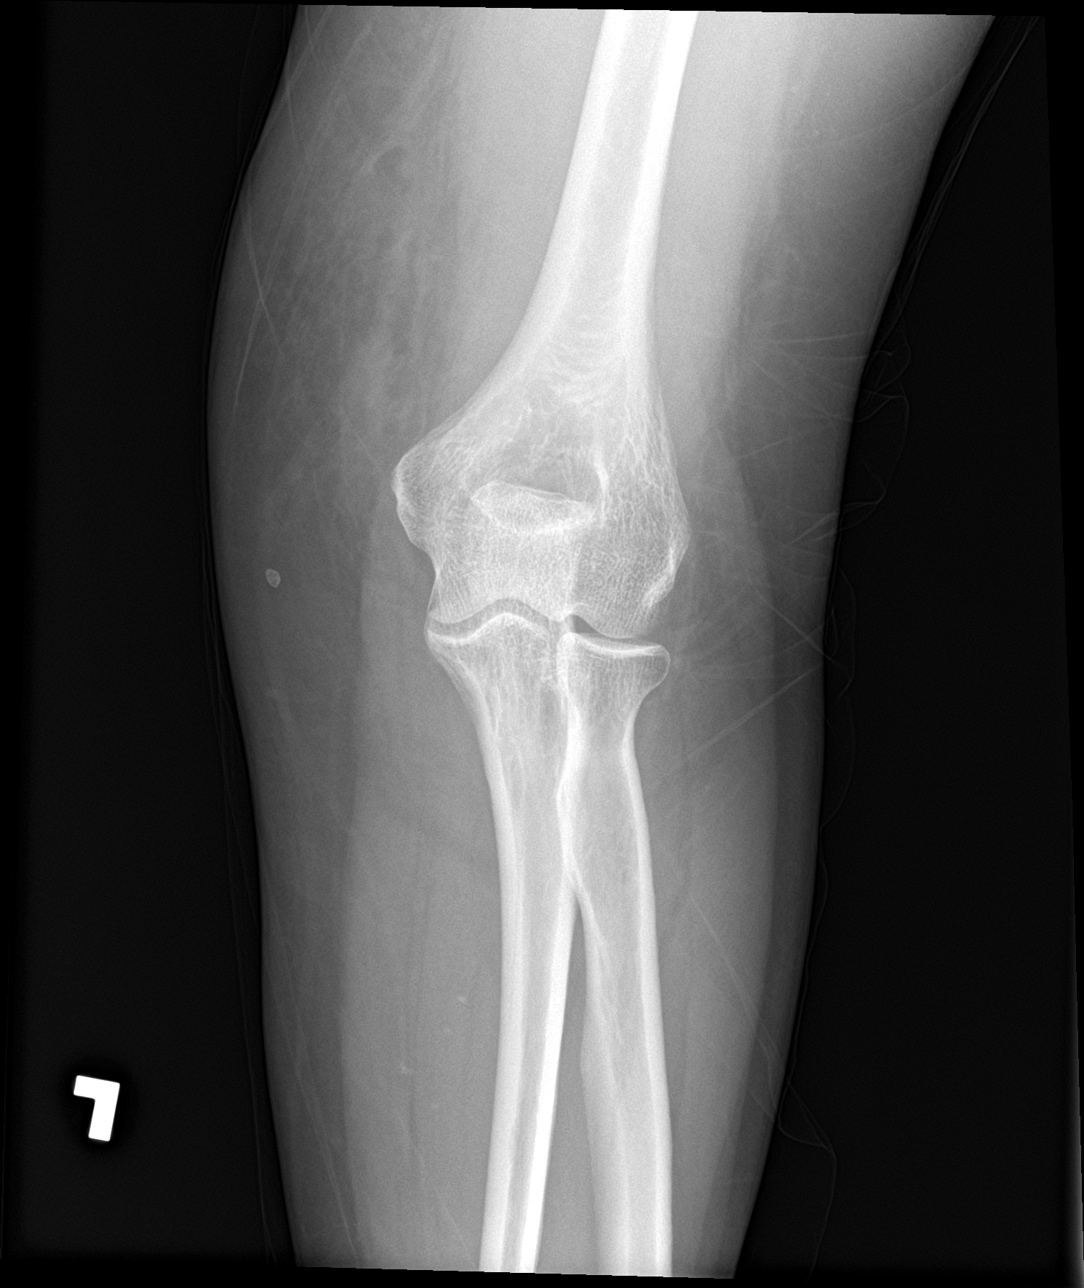

[elbow obl (1 of 2)]
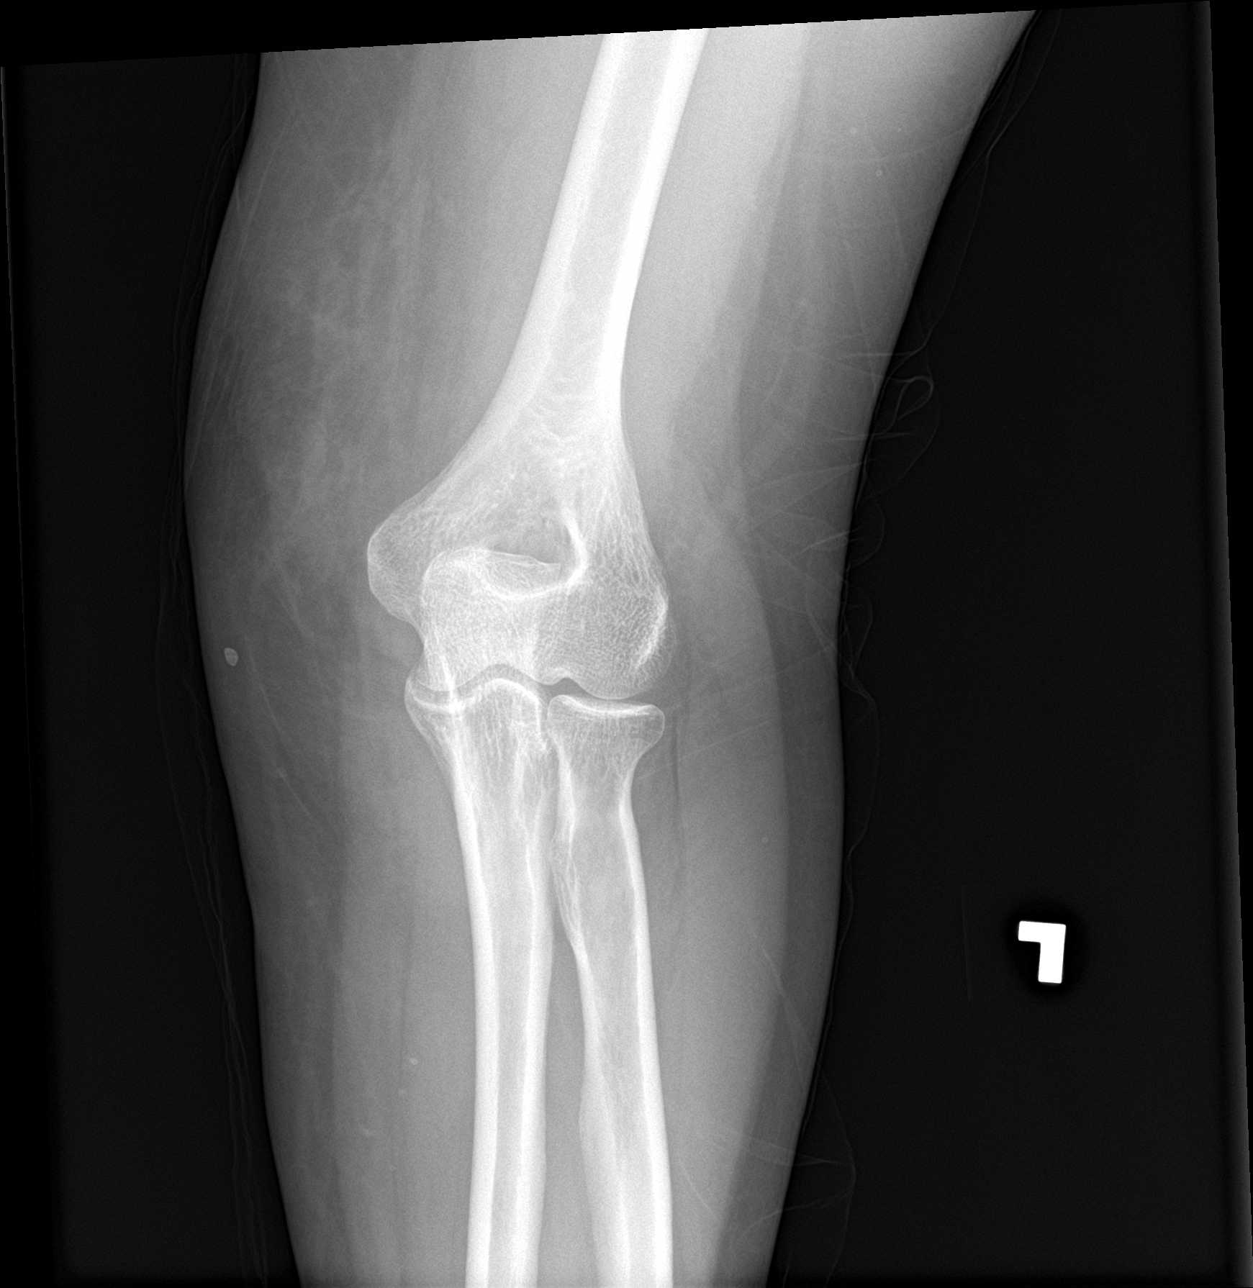

[elbow obl (2 of 2)]
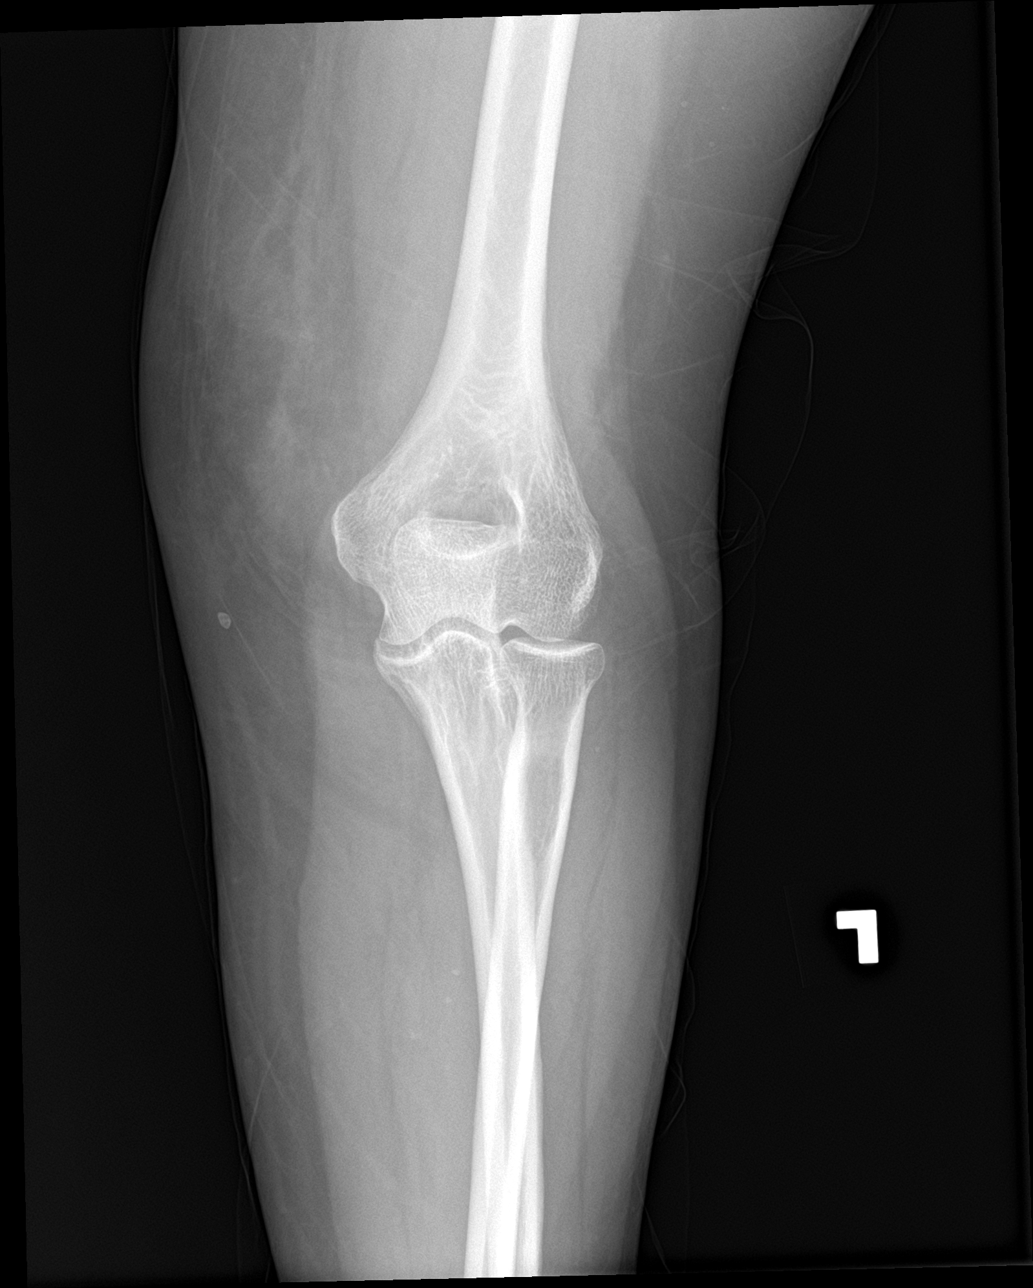

[elbow lat]
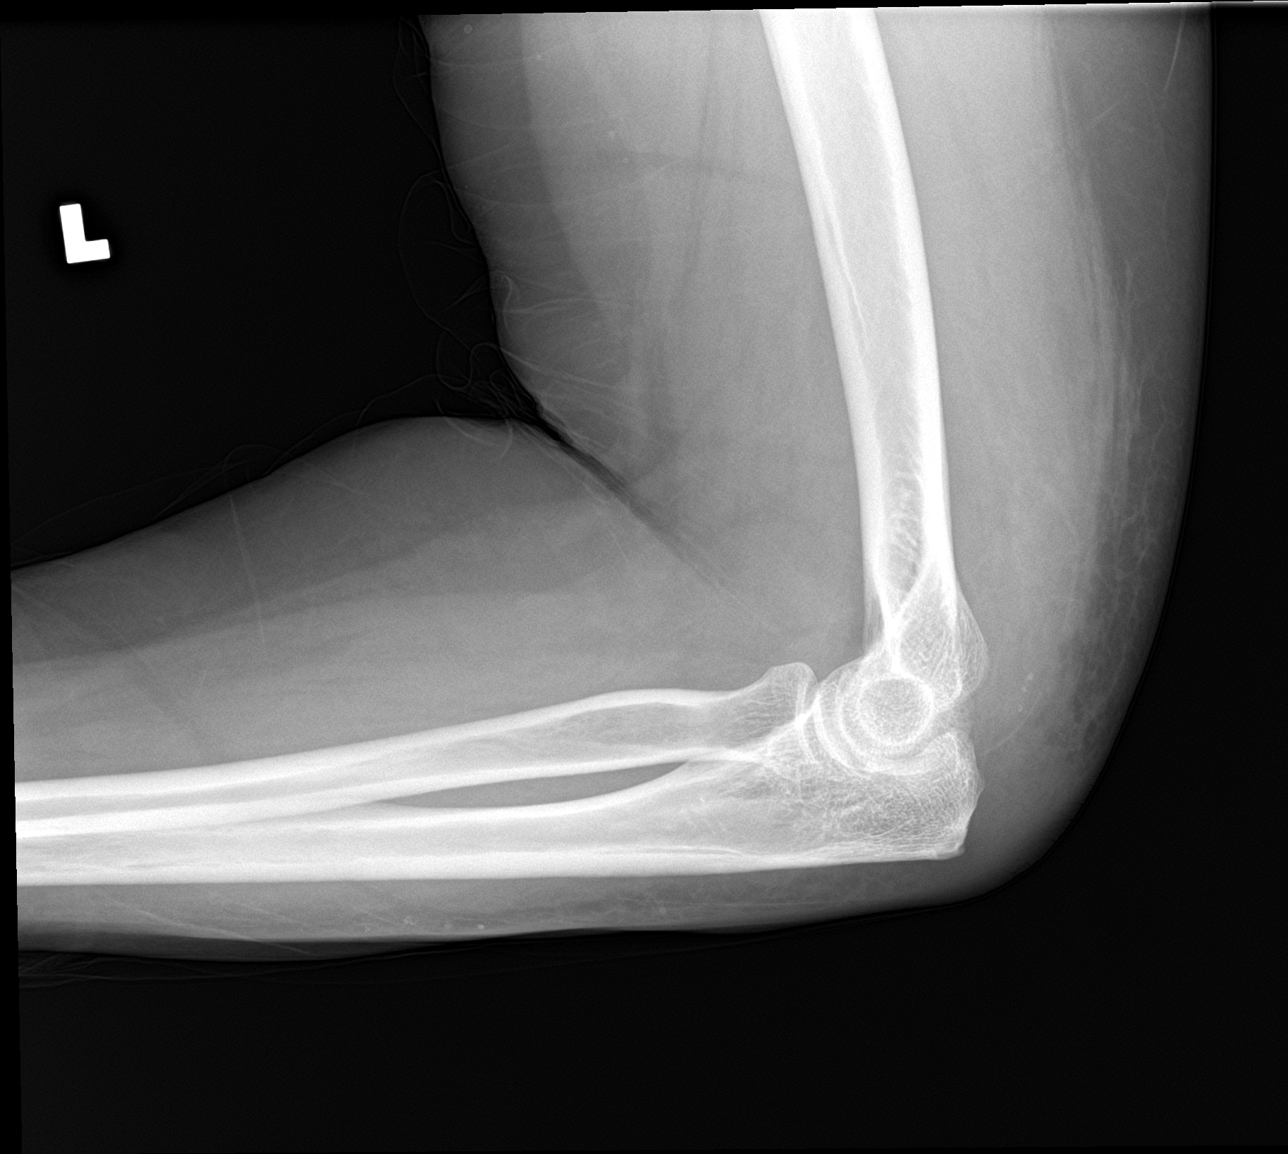

[4 of 4 positions shown; findings below may reference images not displayed]

FINDINGS: No evidence of acute fracture or dislocation of the left elbow. No
significant effusion. Soft tissue swelling over the olecranon region
may represent contusion or olecranon bursitis.
IMPRESSION: No acute fracture or dislocation. Soft tissue swelling over the
olecranon may represent contusion or olecranon bursitis.

## 2021-12-25 IMAGING — DX DG LUMBAR SPINE COMPLETE 4+V
5 series · 5 of 5 positions shown · non-contrast
Comparison: MRI [DATE], radiograph [DATE]

CLINICAL DATA: Fall with low back pain

EXAM:
LUMBAR SPINE - COMPLETE 4+ VIEW

[l-spine ap]
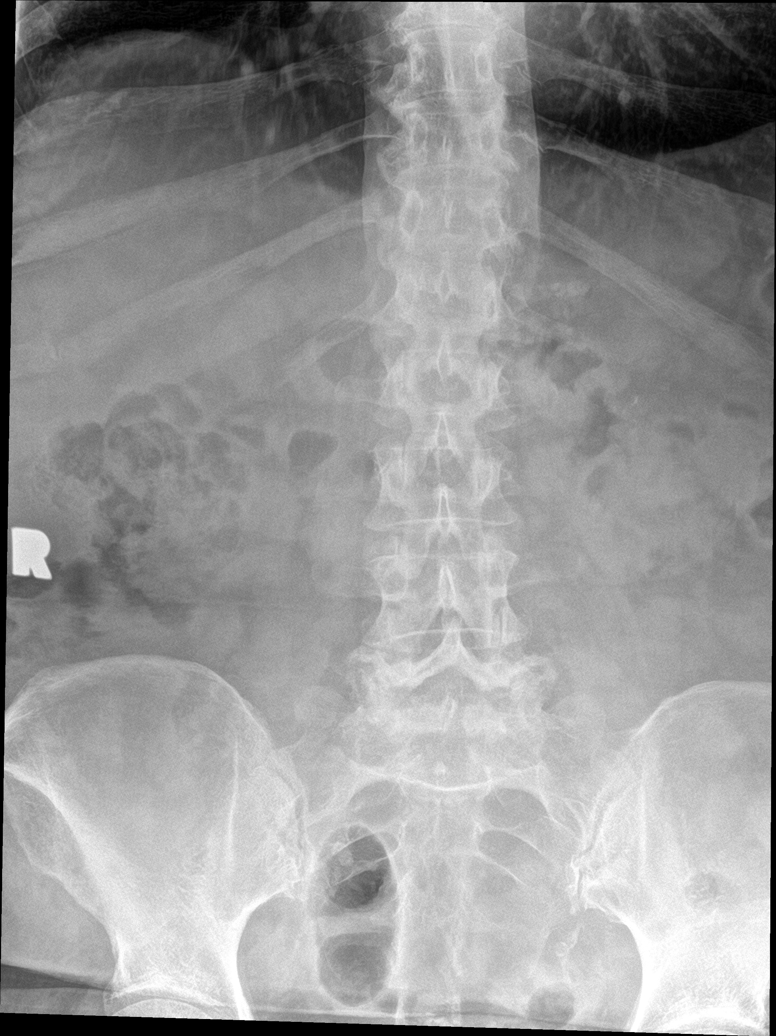

[l-spine obl (1 of 2)]
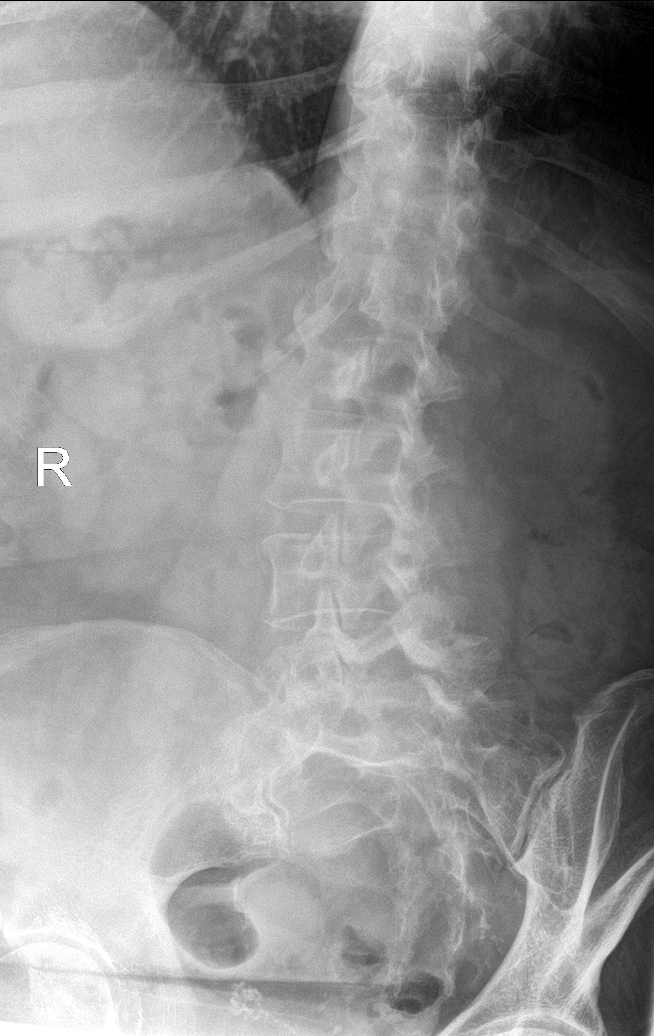

[l-spine obl (2 of 2)]
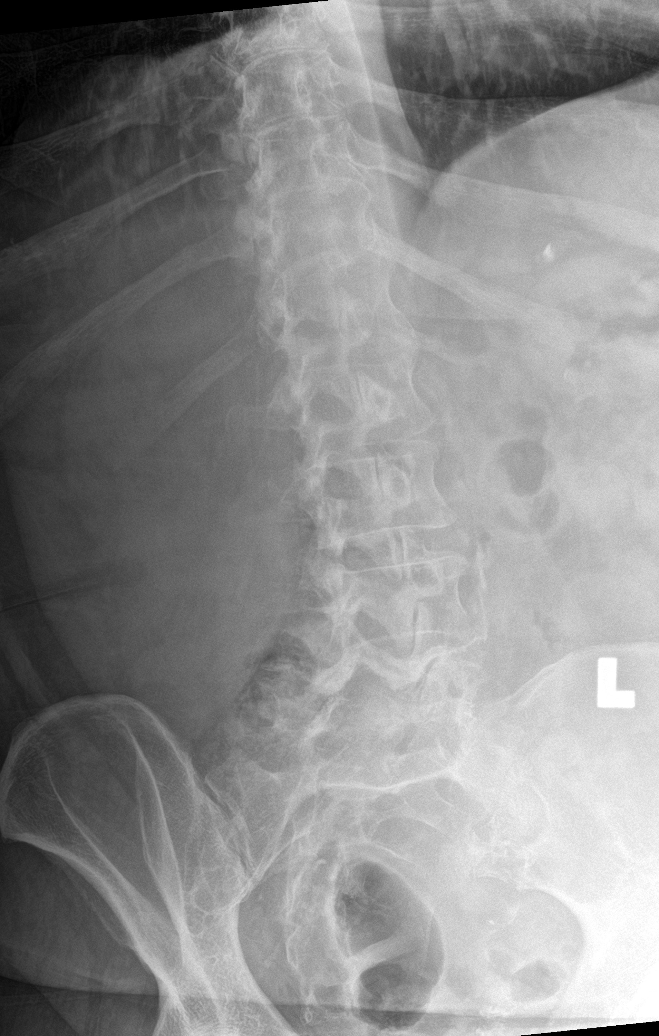

[l-spine lat]
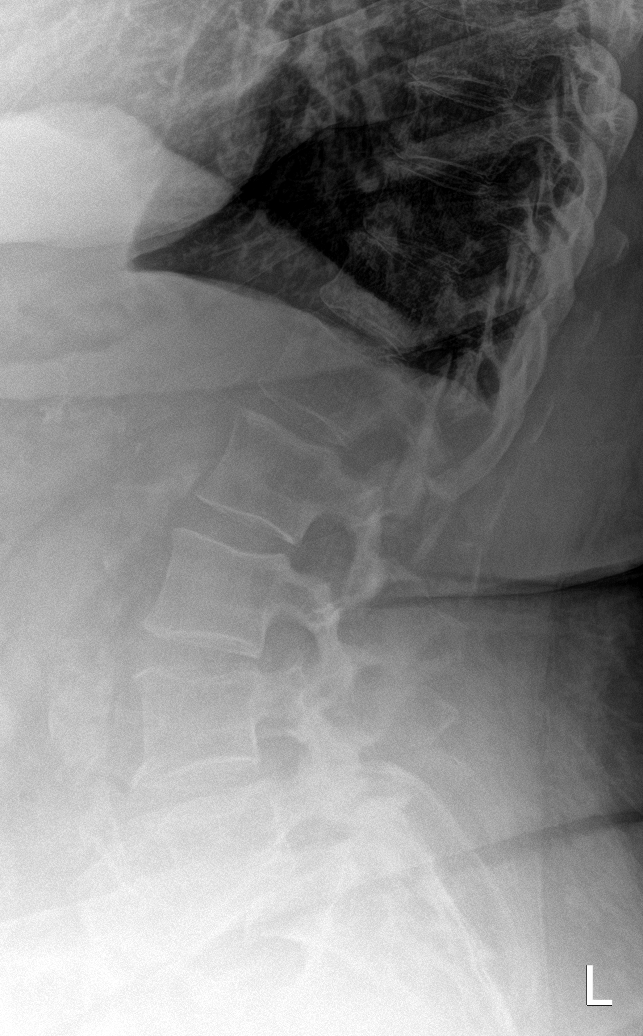

[l-spine spot]
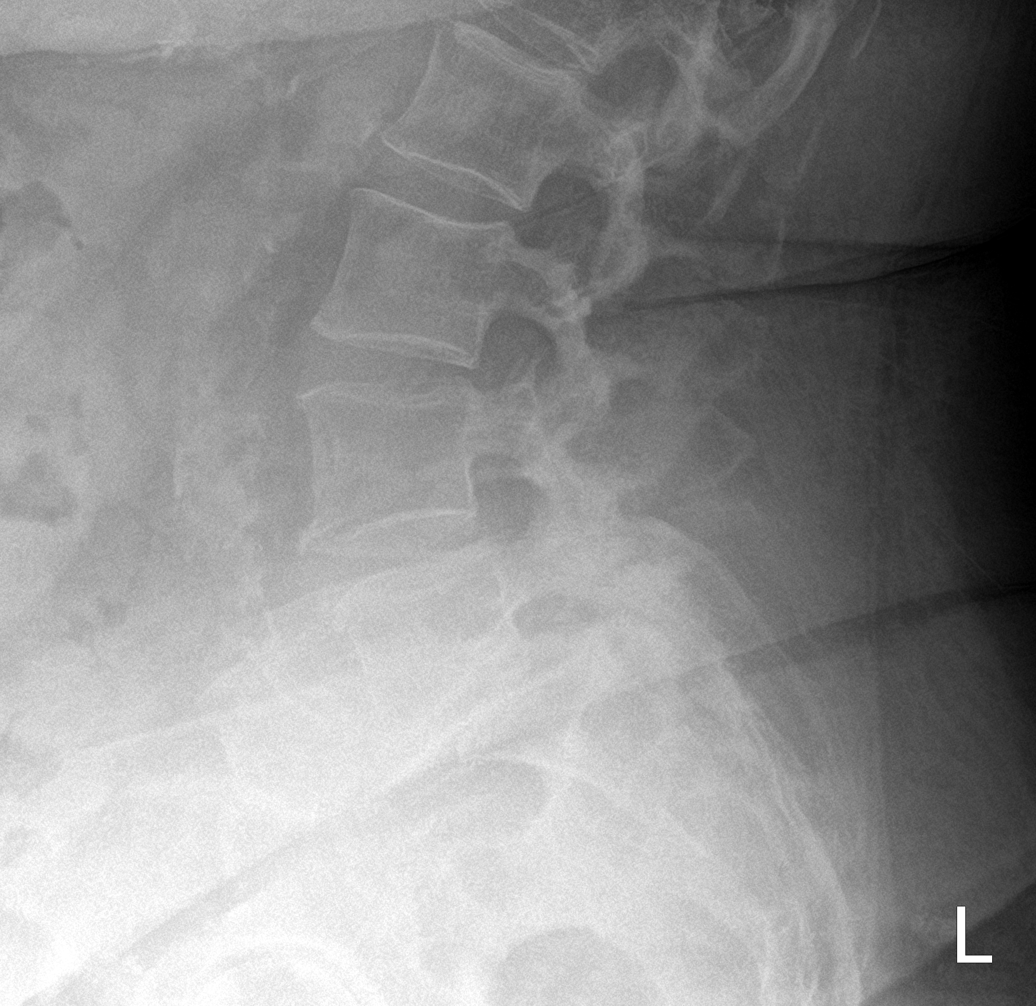

[5 of 5 positions shown; findings below may reference images not displayed]

FINDINGS: Lumbar numbering consistent with previous exams with lowest disc
space designated L5-S1. 17 mm anterolisthesis L5 on S1 which appears
slightly increased compared to prior. Questionable lucency in the
region of the pars at L5. Vertebral body heights are maintained.
Advanced degenerative changes at L5-S1.
IMPRESSION: Further increase in anterolisthesis L5 on S1 now measuring up to 17
mm, 13 mm previously. Questionable lucency in the region of the
pars. Given increase listhesis and history of recent trauma, suggest
CT for further evaluation.

## 2021-12-25 MED ORDER — ACETAMINOPHEN 500 MG PO TABS
1000.0000 mg | ORAL_TABLET | Freq: Once | ORAL | Status: AC
  Administered 2021-12-25: 19:00:00 1000 mg via ORAL

## 2021-12-25 MED ORDER — TIZANIDINE HCL 4 MG PO TABS: 4.0000 mg | ORAL_TABLET | Freq: Three times a day (TID) | ORAL | 0 refills | Status: DC | PRN

## 2021-12-25 MED ORDER — KETOROLAC TROMETHAMINE 30 MG/ML IJ SOLN
30.0000 mg | Freq: Once | INTRAMUSCULAR | Status: AC
  Administered 2021-12-25: 19:00:00 30 mg via INTRAMUSCULAR

## 2021-12-25 MED ORDER — IBUPROFEN 600 MG PO TABS: 600.0000 mg | ORAL_TABLET | Freq: Four times a day (QID) | ORAL | 0 refills | Status: DC | PRN

## 2021-12-25 NOTE — Discharge Instructions (Signed)
Ice, wear the sling as needed for comfort.  Your x-rays were negative for fracture today.  May take 600 mg of ibuprofen combined with 1000 mg of Tylenol together 3-4 times a day as needed for pain.  Zanaflex in case you start to have muscle spasms.  Please follow-up with orthopedics if you are still having elbow pain in 10 days.  Sometimes fractures do not show up on the first day.  Go immediately to the ER for loss of bladder or bowels, numbness in between your thighs or in your perineum, leg weakness, or for any other concerns.

## 2021-12-25 NOTE — ED Provider Notes (Signed)
HPI  SUBJECTIVE:  Kathleen Le is a right-handed 60 y.o. female who presents with left elbow pain after tripping and falling over a rug, falling directly onto her outstretched left arm/elbow.  It was not pressed up against her chest.  She denies falling onto her wrist.  She states that she cannot put weight onto her arm, and has limited range of motion.  No new or different numbness or tingling in her fingers.  She also reports some thoracic left-sided back pain.  Denies direct trauma to the thoracic area, low back pain.  She denies hitting her head, loss of consciousness.  She has not tried anything for this.  No alleviating factors.  Symptoms are worse with putting pressure on the left arm and abduction.  She has a past medical history of diabetes, hypertension, asthma, neuropathy, degenerative disc disease and lumbar spine.  No history of chronic kidney disease, osteoporosis.  ZDG:LOVFIEPP, Royetta Car, PA-C   Past Medical History:  Diagnosis Date   Asthma    Diabetes mellitus without complication ([place])    History of alcohol abuse    History of marijuana use    Hypertension    Substance abuse ([place])     Past Surgical History:  Procedure Laterality Date   DNC      Family History  Problem Relation Age of Onset   Cancer Paternal Grandmother    Diabetes Paternal Grandmother    Diabetes Paternal Grandfather    Heart attack Maternal Aunt    Cancer Maternal Uncle    Cancer Maternal Grandmother    Heart attack Mother    Cancer Mother    Stroke Mother    Diabetes Father    Heart attack Father    Diabetes Paternal Uncle    Diabetes Paternal Uncle     Social History   Tobacco Use   Smoking status: Every Day    Packs/day: 1.00    Types: Cigarettes   Smokeless tobacco: Never  Substance Use Topics   Alcohol use: No   Drug use: No     Current Facility-Administered Medications:    acetaminophen (TYLENOL) tablet 1,000 mg, 1,000 mg, Oral, Once, Melynda Ripple, MD   ketorolac  (TORADOL) 30 MG/ML injection 30 mg, 30 mg, Intramuscular, Once, Melynda Ripple, MD  Current Outpatient Medications:    albuterol (PROVENTIL) (2.5 MG/3ML) 0.083% nebulizer solution, USE 1 VIAL IN NEBULIZER EVERY 4 HOURS AS NEEDED FOR WHEEZING OR SHORTNESS OF BREATH., Disp: 270 mL, Rfl: 5   albuterol (VENTOLIN HFA) 108 (90 Base) MCG/ACT inhaler, Inhale 2 puffs into the lungs every 6 (six) hours as needed., Disp: 18 g, Rfl: 2   AMBULATORY NON FORMULARY MEDICATION, Blood sugar testing strips and lancets for TrueTrack glucometer.  Use to check blood sugar up to two times a day.  Dx: Type 2 Diabetes, Disp: 100 Units, Rfl: 4   AMBULATORY NON FORMULARY MEDICATION, Please provide needles for Tresiba pens, Disp: 100 each, Rfl: 3   amLODipine (NORVASC) 5 MG tablet, Take 1 tablet by mouth once daily, Disp: 90 tablet, Rfl: 0   aspirin 81 MG tablet, Take 81 mg by mouth daily., Disp: , Rfl:    atenolol (TENORMIN) 50 MG tablet, Take 1 tablet by mouth once daily, Disp: 90 tablet, Rfl: 1   azithromycin (ZITHROMAX Z-PAK) 250 MG tablet, Take 2 tablets (500 mg) on  Day 1,  followed by 1 tablet (250 mg) once daily on Days 2 through 5., Disp: 6 tablet, Rfl: 0   budesonide-formoterol (SYMBICORT)  160-4.5 MCG/ACT inhaler, Inhale 2 puffs into the lungs 2 (two) times daily., Disp: 1 each, Rfl: 3   dapagliflozin propanediol (FARXIGA) 10 MG TABS tablet, Take 1 tablet (10 mg total) by mouth daily., Disp: 90 tablet, Rfl: 3   doxepin (SINEQUAN) 25 MG capsule, TAKE 2 CAPSULES BY MOUTH AT BEDTIME AS NEEDED, Disp: 180 capsule, Rfl: 0   DULoxetine (CYMBALTA) 30 MG capsule, Take 1 capsule by mouth twice daily, Disp: 180 capsule, Rfl: 0   esomeprazole (NEXIUM) 20 MG capsule, Take 20 mg by mouth daily at 12 noon., Disp: , Rfl:    Flaxseed, Linseed, (FLAX SEED OIL) 1000 MG CAPS, Take 1,000 mg by mouth 2 (two) times daily., Disp: , Rfl:    glipiZIDE (GLUCOTROL) 10 MG tablet, TAKE 1 TABLET BY MOUTH TWICE DAILY BEFORE A MEAL, Disp: 180  tablet, Rfl: 0   glucose blood test strip, Use up to 4 times per day as directed with glucometer. Disp: 100. Refill x99 please dispense brand per insurance coverage/patient preference, Disp: 300 strip, Rfl: 99   hydrochlorothiazide (HYDRODIURIL) 25 MG tablet, Take 1 tablet by mouth once daily, Disp: 90 tablet, Rfl: 0   ibuprofen (ADVIL) 600 MG tablet, Take 1 tablet (600 mg total) by mouth every 6 (six) hours as needed., Disp: 30 tablet, Rfl: 0   insulin NPH Human (NOVOLIN N RELION) 100 UNIT/ML injection, Inject 0.4 mLs (40 Units total) into the skin at bedtime., Disp: 10 mL, Rfl: 3   Insulin Syringe-Needle U-100 (INSULIN SYRINGE .3CC/31GX5/16") 31G X 5/16" 0.3 ML MISC, Dx DM E11.9. Inject insulin daily at bedtime., Disp: 10 each, Rfl: prn   Lancets 30G MISC, Check blood sugar up to 4 times a day., Disp: 100 each, Rfl: PRN   lisinopril (ZESTRIL) 5 MG tablet, Take 1 tablet by mouth once daily, Disp: 90 tablet, Rfl: 1   metFORMIN (GLUCOPHAGE) 1000 MG tablet, TAKE 1 TABLET BY MOUTH TWICE DAILY WITH MEALS, Disp: 180 tablet, Rfl: 0   nicotine (NICODERM CQ) 21 mg/24hr patch, Place 1 patch (21 mg total) onto the skin daily., Disp: 28 patch, Rfl: 2   nystatin (MYCOSTATIN) 100000 UNIT/ML suspension, Take 5 mLs (500,000 Units total) by mouth 4 (four) times daily., Disp: 473 mL, Rfl: 0   Omega-3 Fatty Acids (FISH OIL) 1000 MG CAPS, Take 1,000 mg by mouth 2 (two) times daily., Disp: , Rfl:    pioglitazone (ACTOS) 30 MG tablet, Take 1 tablet (30 mg total) by mouth daily., Disp: 90 tablet, Rfl: 3   predniSONE (DELTASONE) 20 MG tablet, Take 3 tablets for 3 days, take 2 tablet for 3 days, take 1 tablet for 3 days, take 1/2 tablets for 4 days., Disp: 21 tablet, Rfl: 0   Probiotic Product (PROBIOTIC DAILY PO), Take by mouth daily., Disp: , Rfl:    rosuvastatin (CRESTOR) 20 MG tablet, Take 1 tablet by mouth once daily, Disp: 90 tablet, Rfl: 0   sitaGLIPtin (JANUVIA) 100 MG tablet, Take 1 tablet (100 mg total) by mouth  daily., Disp: 90 tablet, Rfl: 3   tiZANidine (ZANAFLEX) 4 MG tablet, Take 1 tablet (4 mg total) by mouth every 8 (eight) hours as needed for muscle spasms., Disp: 30 tablet, Rfl: 0  Allergies  Allergen Reactions   Gabapentin Nausea And Vomiting    Nauseated/feeling sick   Invokana [Canagliflozin]     Vaginitis    Lyrica [Pregabalin] Other (See Comments)    Pain in feet increased and spasm     ROS  As noted  in HPI.   Physical Exam  BP (!) 143/79 (BP Location: Right Arm)    Pulse 68    Temp 98.3 F (36.8 C) (Oral)    Resp 18    Ht 5\' 6"  (1.676 m)    Wt 117.9 kg    SpO2 95%    BMI 41.97 kg/m   Constitutional: Well developed, well nourished, no acute distress Eyes:  EOMI, conjunctiva normal bilaterally HENT: Normocephalic, atraumatic,mucus membranes moist Respiratory: Normal inspiratory effort, occasional wheezing.  Positive mild diffuse left posterior chest wall tenderness Cardiovascular: Normal rate, regular rhythm, no murmurs, rubs, gallops Back: No C-spine, T-spine, L-spine tenderness. no paralumbar tenderness, no muscle spasm. Bilateral lower extremities nontender , baseline ROM  intact.No pain with flex/extension hips bilaterally. Sensation intact to light touch bilaterally over both legs, DTR's symmetric and intact bilaterally KJ, Motor symmetric bilateral 5/5 hip flexion, quadriceps, hamstrings, EHL, foot dorsiflexion, foot plantarflexion, gait normal GI: nondistended skin: No rash, skin intact Musculoskeletal:  L shoulder with ROM normal , Drop test normal, clavicle NT, A/C joint NT, scapula NT , proximal humerus NT , Motor strength normal, Sensation intact LT over deltoid region, Positive tenderness along the bicep, positive empty can test, unable to perform liftoff test.  L Elbow ROM Decreased, Unable to fully flex, bruising over the medial/lateral epicondyle, olecranon. Tenderness entire joint, Supracondylar region tender, Radial head ender, Olecrenon process tender,  Medial epicondyle tender, Lateral epicondyle tender, Wrist NT, Hand NT, radial pulse intact, Sensation LT and Motor intact distally in distribution of radial, median, and ulnar nerve function.   Neurologic: Alert & oriented x 3, no focal neuro deficits Psychiatric: Speech and behavior appropriate   ED Course   Medications  acetaminophen (TYLENOL) tablet 1,000 mg (has no administration in time range)  ketorolac (TORADOL) 30 MG/ML injection 30 mg (has no administration in time range)    Orders Placed This Encounter  Procedures   DG Humerus Left    Standing Status:   Standing    Number of Occurrences:   1    Order Specific Question:   Reason for Exam (SYMPTOM  OR DIAGNOSIS REQUIRED)    Answer:   fall. left arm injury    Order Specific Question:   Is patient pregnant?    Answer:   No   DG Lumbar Spine Complete    Standing Status:   Standing    Number of Occurrences:   1    Order Specific Question:   Reason for Exam (SYMPTOM  OR DIAGNOSIS REQUIRED)    Answer:   fall. left arm injury    Order Specific Question:   Is patient pregnant?    Answer:   No   DG Elbow Complete Left    Standing Status:   Standing    Number of Occurrences:   1    Order Specific Question:   Reason for Exam (SYMPTOM  OR DIAGNOSIS REQUIRED)    Answer:   Direct trauma, diffuse bruising, rule out fracture, effusion    Order Specific Question:   Is patient pregnant?    Answer:   No   Apply Sling and Swathe    Left elbow sling for comfort    Standing Status:   Standing    Number of Occurrences:   1    Order Specific Question:   Laterality    Answer:   Left    No results found for this or any previous visit (from the past 24 hour(s)). DG Lumbar Spine Complete  Result Date: 12/25/2021 CLINICAL DATA:  Fall with low back pain EXAM: LUMBAR SPINE - COMPLETE 4+ VIEW COMPARISON:  MRI 12/01/2020, radiograph 11/13/2020 FINDINGS: Lumbar numbering consistent with previous exams with lowest disc space designated L5-S1.  17 mm anterolisthesis L5 on S1 which appears slightly increased compared to prior. Questionable lucency in the region of the pars at L5. Vertebral body heights are maintained. Advanced degenerative changes at L5-S1. IMPRESSION: Further increase in anterolisthesis L5 on S1 now measuring up to 17 mm, 13 mm previously. Questionable lucency in the region of the pars. Given increase listhesis and history of recent trauma, suggest CT for further evaluation. Electronically Signed   By: Donavan Foil M.D.   On: 12/25/2021 17:32   DG Elbow Complete Left  Result Date: 12/25/2021 CLINICAL DATA:  Bruising and effusion after fall trauma. Trip and fall injury. EXAM: LEFT ELBOW - COMPLETE 3+ VIEW COMPARISON:  Left humerus 12/25/2021 FINDINGS: No evidence of acute fracture or dislocation of the left elbow. No significant effusion. Soft tissue swelling over the olecranon region may represent contusion or olecranon bursitis. IMPRESSION: No acute fracture or dislocation. Soft tissue swelling over the olecranon may represent contusion or olecranon bursitis. Electronically Signed   By: Lucienne Capers M.D.   On: 12/25/2021 18:19   DG Humerus Left  Result Date: 12/25/2021 CLINICAL DATA:  Fall.  Left arm injury. EXAM: LEFT HUMERUS - 2+ VIEW COMPARISON:  None. FINDINGS: There is no evidence of fracture or other focal bone lesions. Soft tissues are unremarkable. IMPRESSION: Negative. Electronically Signed   By: Kerby Moors M.D.   On: 12/25/2021 17:25    ED Clinical Impression  1. Contusion of left elbow, initial encounter   2. Injury of left shoulder, initial encounter   3. Anterolisthesis of lumbar spine      ED Assessment/Plan  Primary concern is the left elbow.  She may have injured her rotator cuff.  Doubt rib fracture, spinal cord compression.  We discussed the worsening anterior listhesis, and recommendations for CT, but I think this can be done as an outpatient as there is no evidence of spinal cord  compression, nor does she complain of lumbar pain.   Reviewed imaging independently.   L-spine: Advanced degenerative changes at L5/S1.  Increased listhesis and questionable lucency in the regional of the L5 pars.  CT recommended.   Left arm: No fracture  Left elbow: No fracture, effusion, dislocation. see radiology report for full details.   Will give 30 mg of Toradol IM here and 1000 mg of Tylenol p.o. sending home with Tylenol/ibuprofen, Zanaflex, rest, ice, follow-up with orthopedics in 10 days if not any better for repeat imaging and evaluation.   she will need to follow-up with her doctor for outpatient lumbar CT. ER return precautions given   Discussed imaging, MDM, treatment plan, and plan for follow-up with patient. Discussed sn/sx that should prompt return to the ED. patient agrees with plan.   Meds ordered this encounter  Medications   acetaminophen (TYLENOL) tablet 1,000 mg   ketorolac (TORADOL) 30 MG/ML injection 30 mg   tiZANidine (ZANAFLEX) 4 MG tablet    Sig: Take 1 tablet (4 mg total) by mouth every 8 (eight) hours as needed for muscle spasms.    Dispense:  30 tablet    Refill:  0   ibuprofen (ADVIL) 600 MG tablet    Sig: Take 1 tablet (600 mg total) by mouth every 6 (six) hours as needed.    Dispense:  30 tablet  Refill:  0      *This clinic note was created using [employer]. Therefore, there may be occasional mistakes despite careful proofreading.  ?    Melynda Ripple, MD 12/27/21 [phone]

## 2021-12-25 NOTE — ED Triage Notes (Signed)
Pt states that she fell and has some left arm pain. Pt states that she has some back pain as well.  X1 day

## 2021-12-26 ENCOUNTER — Other Ambulatory Visit: Payer: Self-pay | Admitting: Physician Assistant

## 2021-12-27 ENCOUNTER — Encounter: Payer: Self-pay | Admitting: Physician Assistant

## 2021-12-31 ENCOUNTER — Ambulatory Visit: Payer: 59

## 2021-12-31 ENCOUNTER — Ambulatory Visit (INDEPENDENT_AMBULATORY_CARE_PROVIDER_SITE_OTHER): Payer: 59 | Admitting: Physician Assistant

## 2021-12-31 ENCOUNTER — Encounter: Payer: Self-pay | Admitting: Physician Assistant

## 2021-12-31 ENCOUNTER — Other Ambulatory Visit: Payer: Self-pay

## 2021-12-31 VITALS — BP 165/67 | HR 97 | Ht 66.0 in | Wt 274.0 lb

## 2021-12-31 DIAGNOSIS — M545 Low back pain, unspecified: Secondary | ICD-10-CM

## 2021-12-31 DIAGNOSIS — W19XXXD Unspecified fall, subsequent encounter: Secondary | ICD-10-CM | POA: Diagnosis not present

## 2021-12-31 DIAGNOSIS — M542 Cervicalgia: Secondary | ICD-10-CM

## 2021-12-31 DIAGNOSIS — M25522 Pain in left elbow: Secondary | ICD-10-CM

## 2021-12-31 DIAGNOSIS — M5136 Other intervertebral disc degeneration, lumbar region: Secondary | ICD-10-CM

## 2021-12-31 DIAGNOSIS — W19XXXA Unspecified fall, initial encounter: Secondary | ICD-10-CM

## 2021-12-31 DIAGNOSIS — S40022D Contusion of left upper arm, subsequent encounter: Secondary | ICD-10-CM | POA: Diagnosis not present

## 2021-12-31 DIAGNOSIS — S40022A Contusion of left upper arm, initial encounter: Secondary | ICD-10-CM | POA: Insufficient documentation

## 2021-12-31 HISTORY — DX: Contusion of left upper arm, initial encounter: S40.022A

## 2021-12-31 HISTORY — DX: Low back pain, unspecified: M54.50

## 2021-12-31 HISTORY — DX: Cervicalgia: M54.2

## 2021-12-31 HISTORY — DX: Unspecified fall, initial encounter: W19.XXXA

## 2021-12-31 MED ORDER — KETOROLAC TROMETHAMINE 60 MG/2ML IM SOLN
60.0000 mg | Freq: Once | INTRAMUSCULAR | Status: AC
  Administered 2021-12-31: 60 mg via INTRAMUSCULAR

## 2021-12-31 NOTE — Patient Instructions (Addendum)
Will get neck xray Will get CT of elbow Continue ice and heat

## 2022-01-01 ENCOUNTER — Encounter: Payer: Self-pay | Admitting: Physician Assistant

## 2022-01-01 NOTE — Progress Notes (Signed)
Subjective:    Patient ID: Kathleen Le, female    DOB: April 15, 1961, 61 y.o.   MRN: [id number]  HPI Pt is a 61 yo right handed female who presents to the clinic to follow up on left elbow pain after tripping over a crinkled up rug on 12/24/2021 at a [employer]. She fell directly onto her 61-year-old outstretched left arm and elbow. outstretched left arm and elbow. She has been having neck, left arm, left elbow, low back pain since the fall. She went to UC on 12/25/2021 and had xray of humerus, elbow, lumbar spine that showed no fractures. No alleviating factors.  Symptoms are worse with putting pressure on the left arm and abduction.  She has a past medical history of diabetes, hypertension, asthma, neuropathy, degenerative disc disease and lumbar spine.  No history of chronic kidney disease, osteoporosis.   No acute fractures on xray but did see a worsening of anterolisthesis in lumbar spine.  She was given:   30 mg of Toradol IM here and 1000 mg of Tylenol p.o. sending home with Tylenol/ibuprofen, Zanaflex, rest, ice, follow-up with orthopedics in 10 days if not any better for repeat imaging and evaluation.     She presents to the clinic today because she continues to have left arm bruising, swelling, pain. She is also having more neck pain today.   .. Active Ambulatory Problems    Diagnosis Date Noted   Type II diabetes mellitus, uncontrolled 02/03/2014   Unspecified asthma(493.90) 02/03/2014   Essential hypertension, benign 02/03/2014   Neuropathy 02/05/2014   IT band syndrome 02/05/2014   Sinus tachycardia 02/05/2014   Memory changes 02/05/2014   Word finding difficulty 02/05/2014   Cervical cancer ([place]) 02/05/2014   Depression 05/19/2014   Anxiety 05/19/2014   Chronic bilateral low back pain with right-sided sciatica 05/19/2014   DDD (degenerative disc disease), lumbar 05/19/2014   PTSD (post-traumatic stress disorder) 09/26/2014   Foot callus 10/03/2014   Gastroesophageal reflux disease with esophagitis 01/10/2015    Cerumen impaction 01/24/2015   Hyperlipidemia 01/24/2015   Pulmonary emphysema ([place]) 07/26/2012   Class 3 severe obesity due to excess calories with serious comorbidity and body mass index (BMI) of 40.0 to 44.9 in adult ([place]) 08/31/2017   Insomnia due to other mental disorder 10/27/2017   History of substance abuse ([place]) 10/27/2017   GAD (generalized anxiety disorder) 10/27/2017   Lower leg edema 11/10/2017   Current smoker 06/08/2018   Diabetes mellitus, type II, insulin dependent ([place]) 06/08/2018   Claustrophobia 06/08/2018   Primary insomnia 06/08/2018   Hot flashes 06/08/2018   Abnormal weight gain 06/08/2018   Acute stress reaction 09/12/2018   Anterolisthesis 07/30/2020   Hyperlipidemia associated with type 2 diabetes mellitus ([place]) 11/05/2020   Non-restorative sleep 11/05/2020   Snoring 11/05/2020   Chronic obstructive pulmonary disease ([place]) 02/26/2021   Encounter for smoking cessation counseling 11/15/2021   Fall 12/31/2021   Contusion of left arm 12/31/2021   Acute left-sided low back pain without sciatica 12/31/2021   Neck pain 12/31/2021   Resolved Ambulatory Problems    Diagnosis Date Noted   Bruised rib 09/26/2014   Bruised ribs 09/26/2014   Wound of right leg 09/26/2014   Essential hypertension 11/10/2017   Former smoker 03/09/2018   Past Medical History:  Diagnosis Date   Asthma    Diabetes mellitus without complication ([place])    History of alcohol abuse    History of marijuana use    Hypertension    Substance abuse ([place])  Review of Systems See HPI.     Objective:   Physical Exam  Left upper, elbow, forearm swollen and black and blue bruising. Tenderness right over olcecranon but no tenderness of epicondyle.  NROM of left shoulder ROM of neck decreased from side to side to due pain and stiffness No significant pain to palpation over cervical or lumbar spine Tight and tender paraspinous muscles Overall left strength decreased due to  pain      Assessment & Plan:  [name] KitchenAlayssa was seen today for follow-up.  Diagnoses and all orders for this visit:  Fall, subsequent encounter -     DG Cervical Spine Complete; Future -     CT ELBOW LEFT WO CONTRAST; Future -     ketorolac (TORADOL) injection 60 mg -     CT Lumbar Spine Wo Contrast; Future  DDD (degenerative disc disease), lumbar -     ketorolac (TORADOL) injection 60 mg -     CT Lumbar Spine Wo Contrast; Future  Acute left-sided low back pain without sciatica -     ketorolac (TORADOL) injection 60 mg -     CT Lumbar Spine Wo Contrast; Future  Contusion of left upper extremity, subsequent encounter -     ketorolac (TORADOL) injection 60 mg  Neck pain -     DG Cervical Spine Complete; Future -     ketorolac (TORADOL) injection 60 mg -     CT Lumbar Spine Wo Contrast; Future  Left elbow pain -     CT ELBOW LEFT WO CONTRAST; Future  Pt continues to rate her pain 8/10 Another shot of toradol 60mg  IM given in office today CT of lumbar spine and left elbow with xray of cervical spine ordered today Pt continues to have signficant bruising and swelling of left forearm/elbow/upper arm.  Continue with xanaxflex, ibuprofen, tylenol.  Use lots of ice Follow up as needed or if symptoms worsen

## 2022-01-02 ENCOUNTER — Encounter: Payer: Self-pay | Admitting: Physician Assistant

## 2022-01-03 ENCOUNTER — Other Ambulatory Visit: Payer: Self-pay | Admitting: Physician Assistant

## 2022-01-03 DIAGNOSIS — Z794 Long term (current) use of insulin: Secondary | ICD-10-CM

## 2022-01-03 DIAGNOSIS — E119 Type 2 diabetes mellitus without complications: Secondary | ICD-10-CM

## 2022-01-06 ENCOUNTER — Ambulatory Visit (INDEPENDENT_AMBULATORY_CARE_PROVIDER_SITE_OTHER): Payer: 59

## 2022-01-06 ENCOUNTER — Other Ambulatory Visit: Payer: Self-pay

## 2022-01-06 DIAGNOSIS — M5136 Other intervertebral disc degeneration, lumbar region: Secondary | ICD-10-CM | POA: Diagnosis not present

## 2022-01-06 DIAGNOSIS — W19XXXD Unspecified fall, subsequent encounter: Secondary | ICD-10-CM | POA: Diagnosis not present

## 2022-01-06 DIAGNOSIS — M25522 Pain in left elbow: Secondary | ICD-10-CM | POA: Diagnosis not present

## 2022-01-06 DIAGNOSIS — M545 Low back pain, unspecified: Secondary | ICD-10-CM

## 2022-01-06 DIAGNOSIS — M542 Cervicalgia: Secondary | ICD-10-CM

## 2022-01-06 IMAGING — CT CT L SPINE W/O CM
3 of 5 series · 12 of 33 positions shown, 13 images · non-contrast
Comparison: [DATE] lumbar MRI

CLINICAL DATA: Trauma with low back pain anterolisthesis by x-ray

EXAM:
CT LUMBAR SPINE WITHOUT CONTRAST
TECHNIQUE: Multidetector CT imaging of the lumbar spine was performed without
intravenous contrast administration. Multiplanar CT image
reconstructions were also generated.

[Series 5: sagittal bone · sagittal · 0.29mm/px · 5 of 63 slices shown]
[im 11/63  bone]
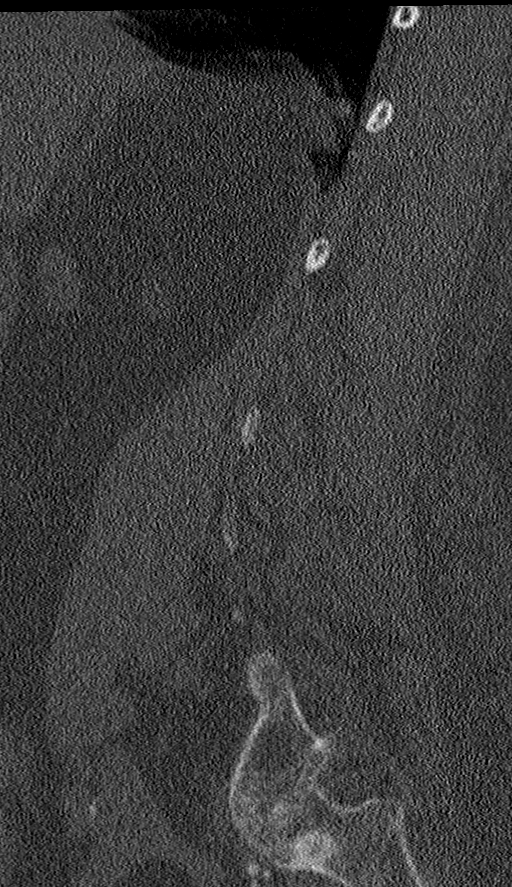
[im 21/63  bone]
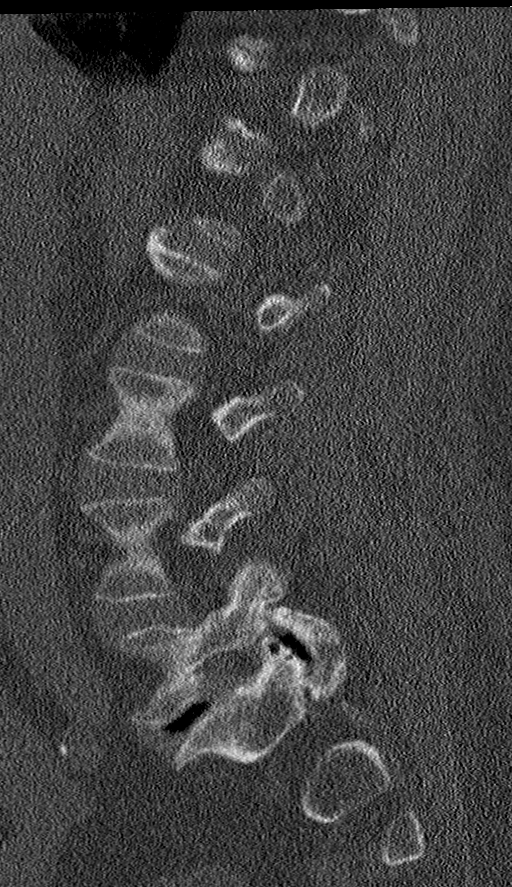
[im 32/63  bone]
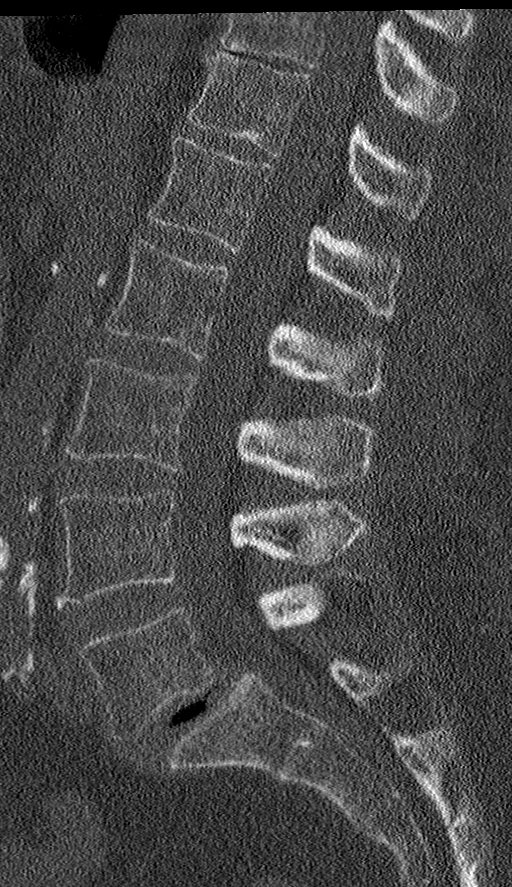
[im 42/63  bone]
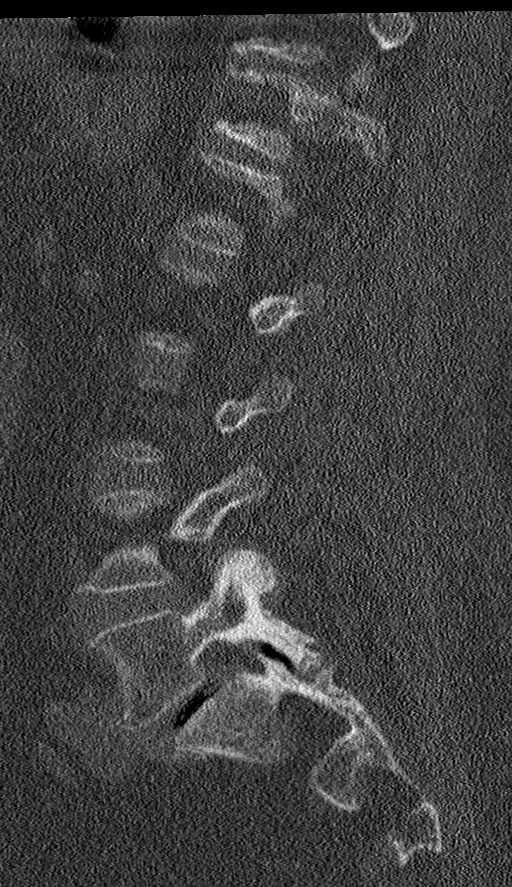
[im 52/63  bone]
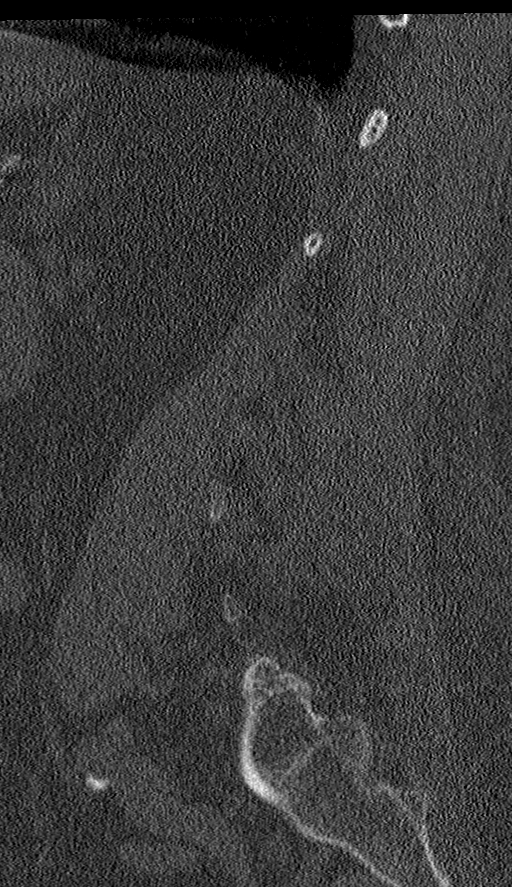

[Series 6: coronal bone · coronal · 0.26mm/px · 3 of 75 slices shown]
[im 15/75  bone]
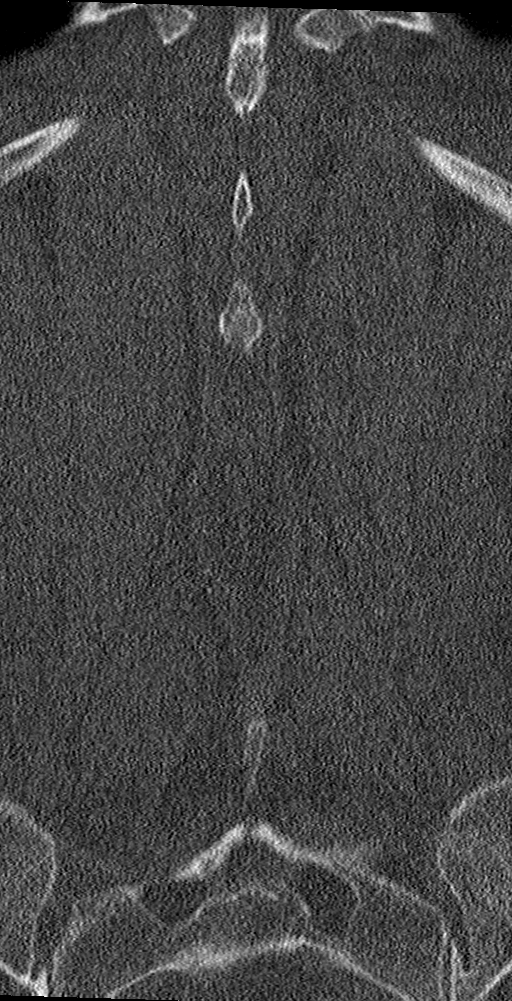
[im 30/75  bone]
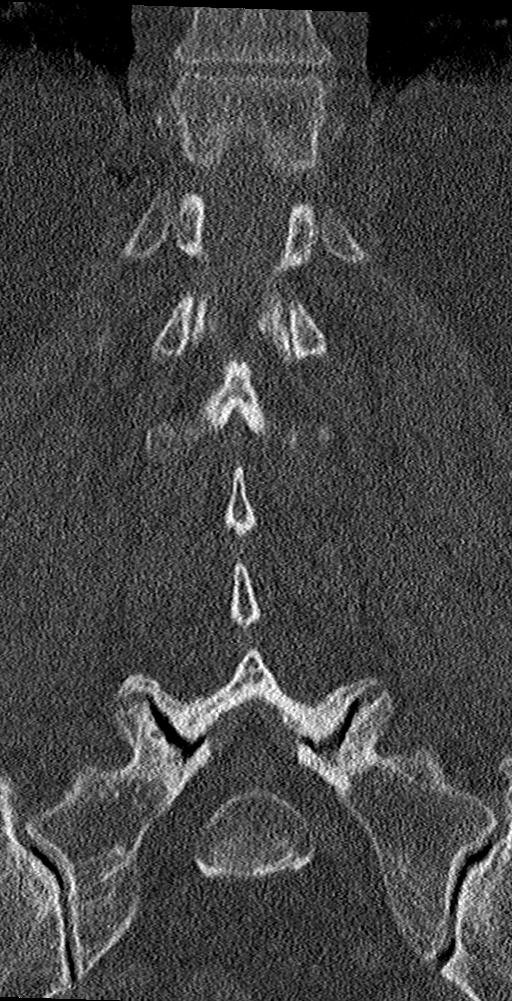
[im 45/75  bone]
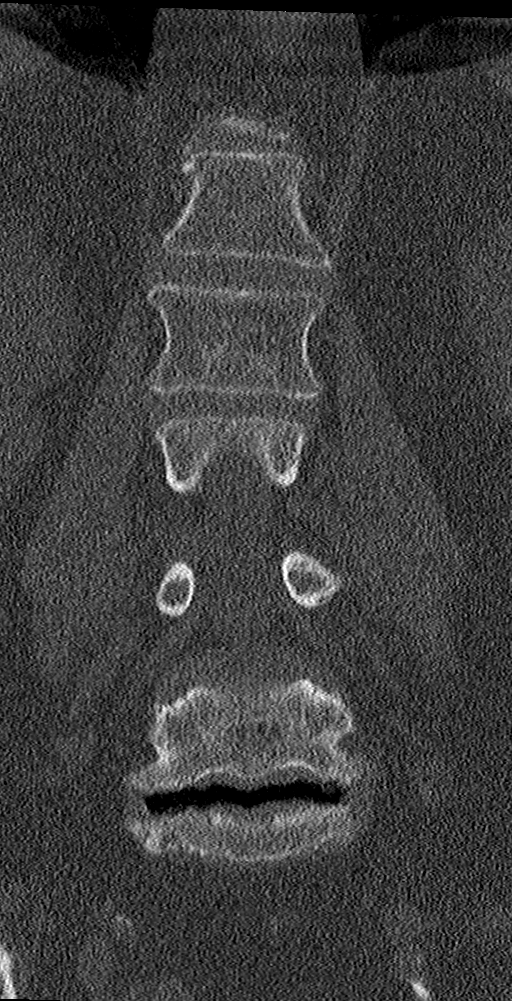

[Series 7: orthogonal axial st · axial · 0.30mm/px · z∈[-347,-177]mm · 4 of 128 slices shown, 5 images]
[im 22/128  soft-tissue]
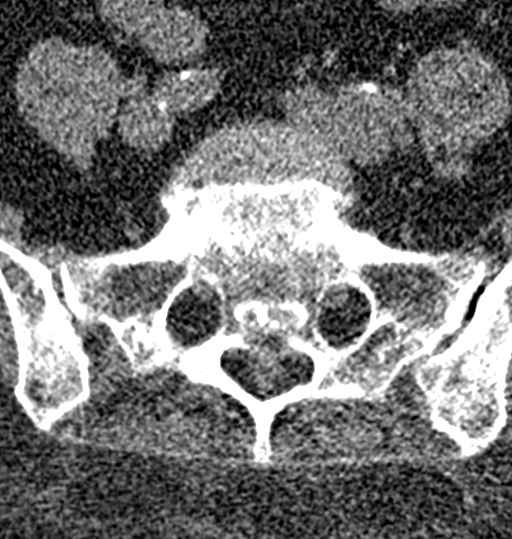
[im 22/128  bone]
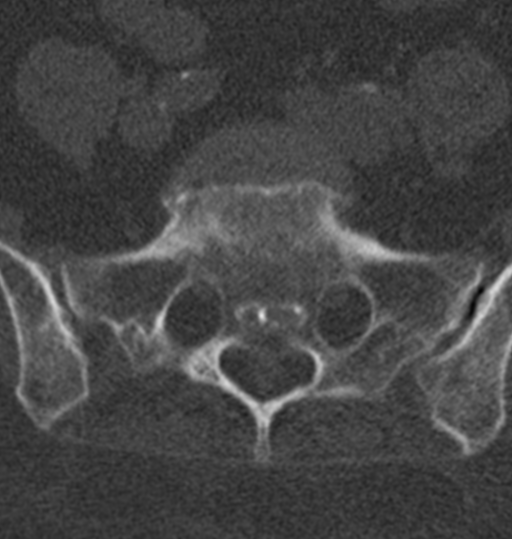
[im 43/128  bone]
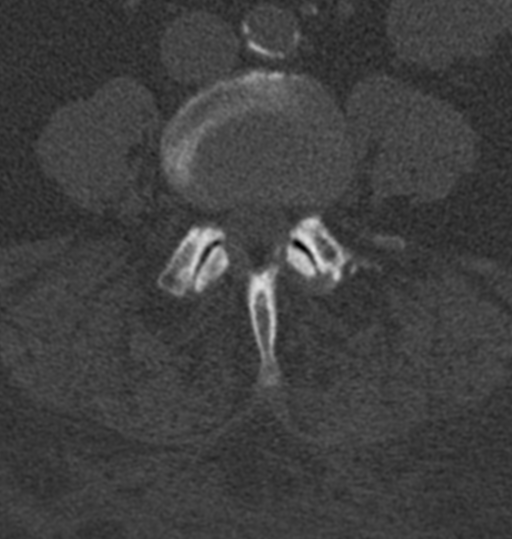
[im 85/128  bone]
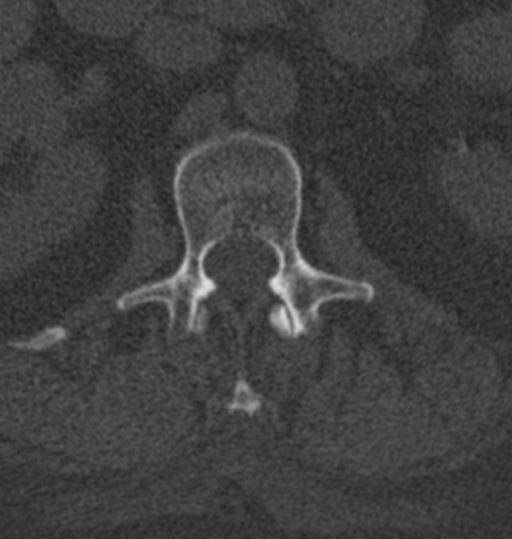
[im 106/128  bone]
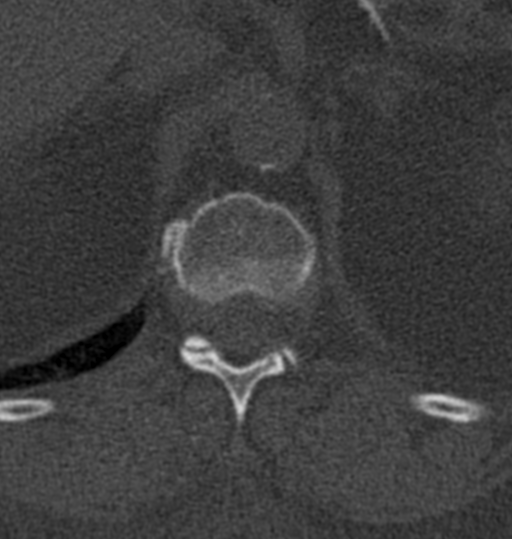

[12 of 33 positions shown; findings below may reference images not displayed]

FINDINGS: Segmentation: 5 lumbar type vertebrae

Alignment: 1 cm of anterolisthesis at L5-S1

Vertebrae: No acute fracture or focal pathologic process.

Paraspinal and other soft tissues: Negative.

Disc levels:

T11-12: Disc space narrowing.  No evidence of impingement.

L4-L5: Mild facet spurring and vacuum phenomenon. No visible
impingement. Mild disc bulging.

L5-S1:Facet osteoarthritis with spurring, vacuum phenomenon, and
anterolisthesis. Disc space narrowing and gas containing fissure.
Circumferential disc bulging. Mild bilateral foraminal narrowing.
IMPRESSION: Facet osteoarthritis at L4-5 and especially at L5-S1 where there is
anterolisthesis and accelerated disc degeneration. No detected
change since a [ZA] MRI.

## 2022-01-06 IMAGING — CT CT ELBOW*L* W/O CM
3 of 6 series · 11 of 36 positions shown, 12 images · non-contrast
Comparison: Left humerus and elbow x-rays dated [DATE].

CLINICAL DATA: Persistent left elbow pain since fall almost 2 weeks
ago.

EXAM:
CT OF THE UPPER LEFT EXTREMITY WITHOUT CONTRAST
TECHNIQUE: Multidetector CT imaging of the upper left extremity was performed
according to the standard protocol.
RADIATION DOSE REDUCTION: This exam was performed according to the
departmental dose-optimization program which includes automated
exposure control, adjustment of the mA and/or kV according to
patient size and/or use of iterative reconstruction technique.

[Series 4: axial bone · axial · 0.30mm/px · z∈[-1093,-955]mm · 4 of 214 slices shown, 5 images]
[im 36/214  soft-tissue]
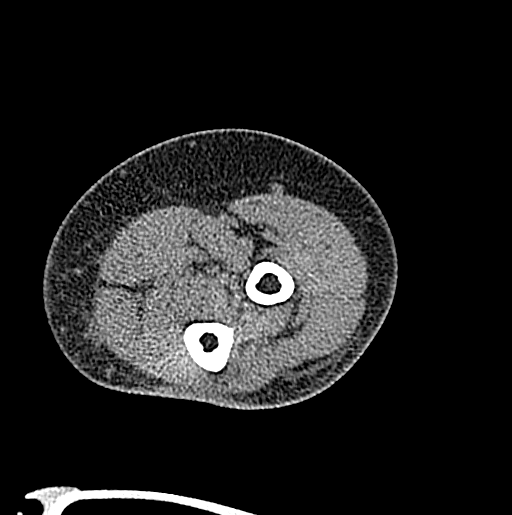
[im 36/214  bone]
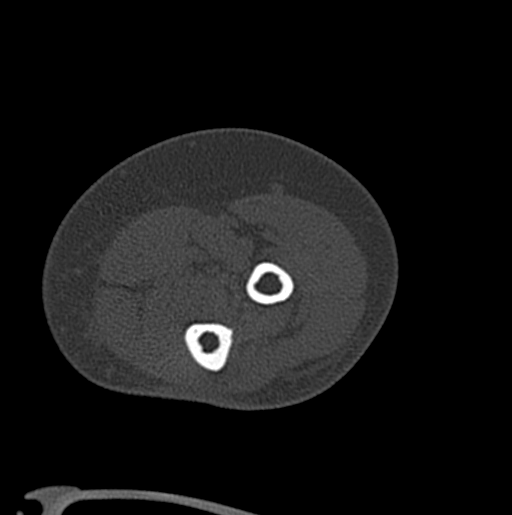
[im 72/214  bone]
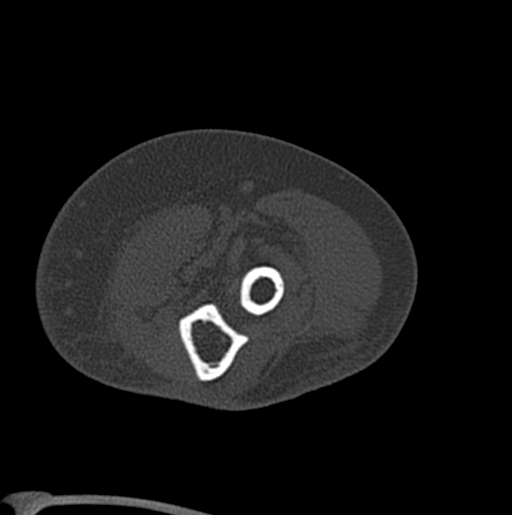
[im 143/214  bone]
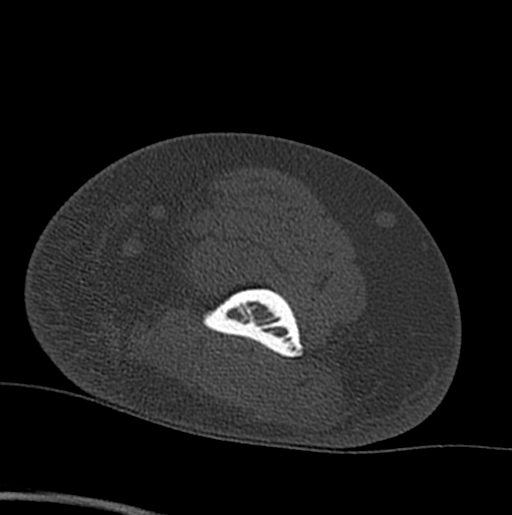
[im 178/214  bone]
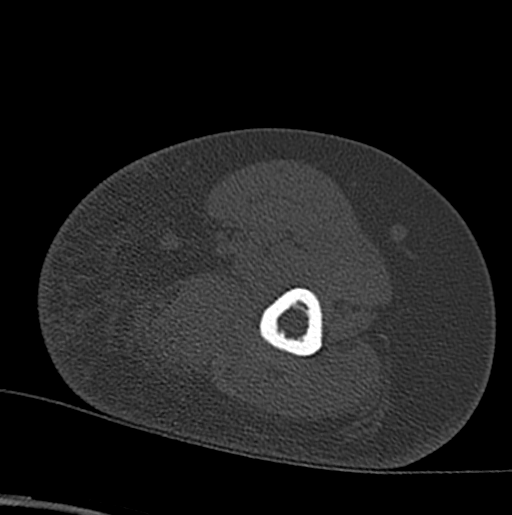

[Series 5: cor bone · coronal · 0.27mm/px · 1 of 113 slices shown]
[im 57/113  bone]
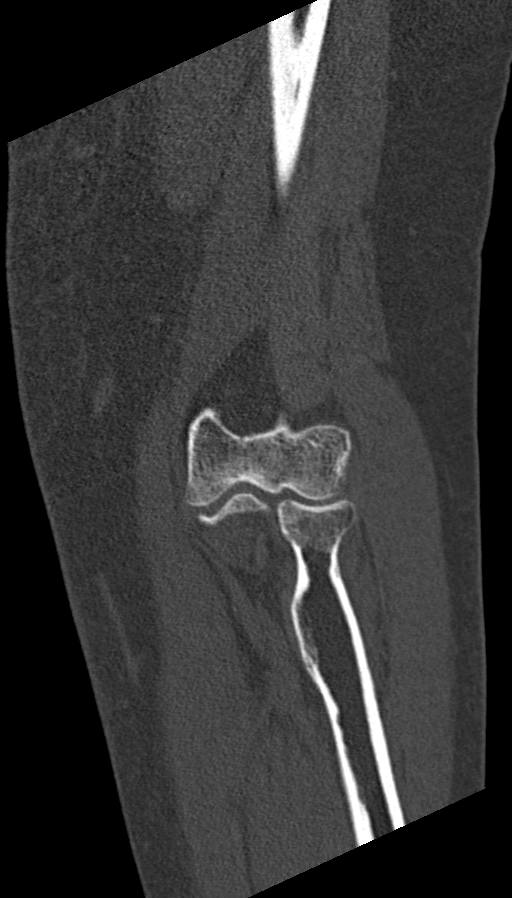

[Series 6: sag bone · sagittal · 0.24mm/px · 6 of 149 slices shown]
[im 25/149  bone]
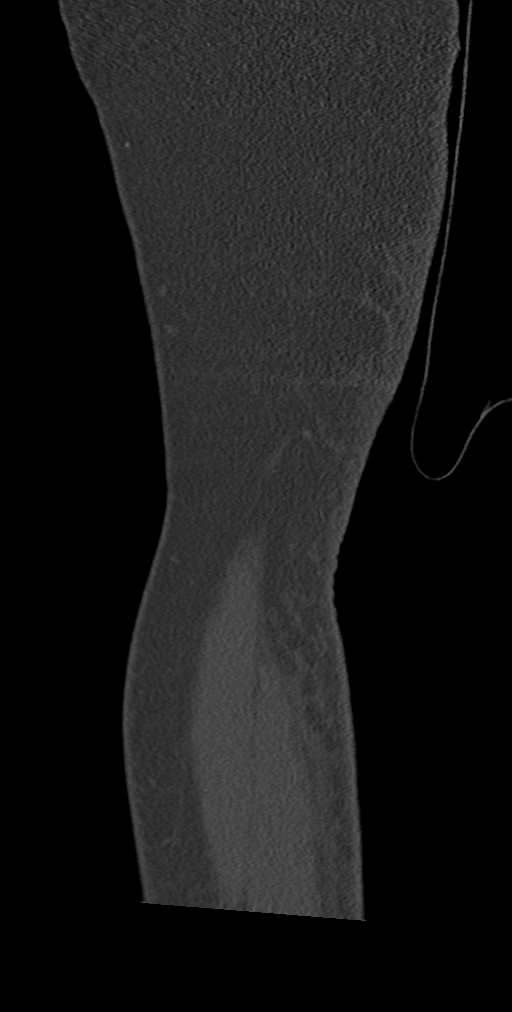
[im 50/149  bone]
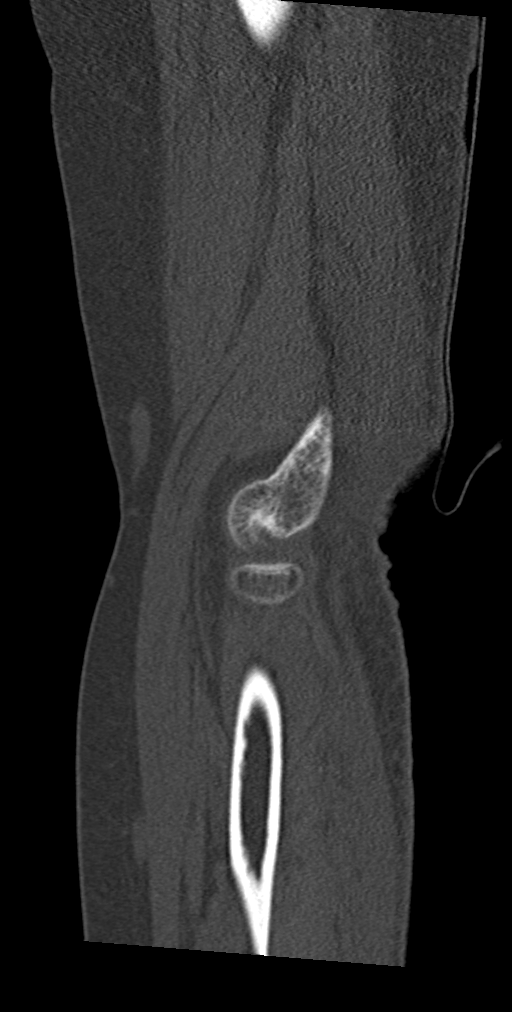
[im 67/149  soft-tissue]
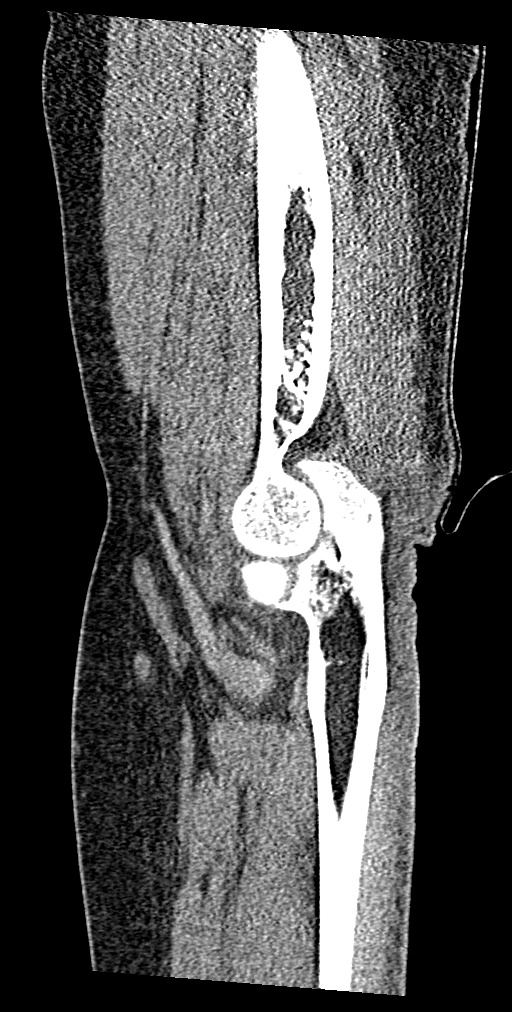
[im 75/149  bone]
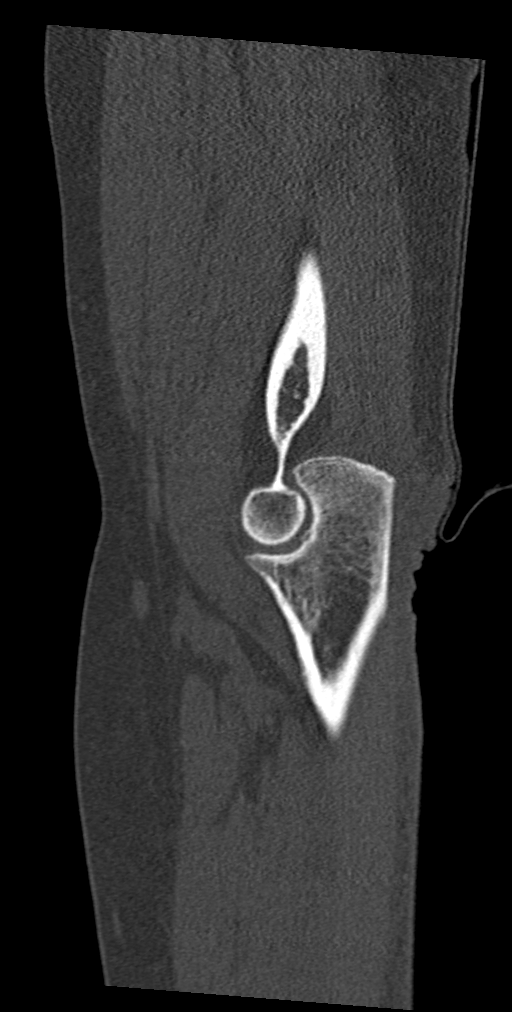
[im 99/149  bone]
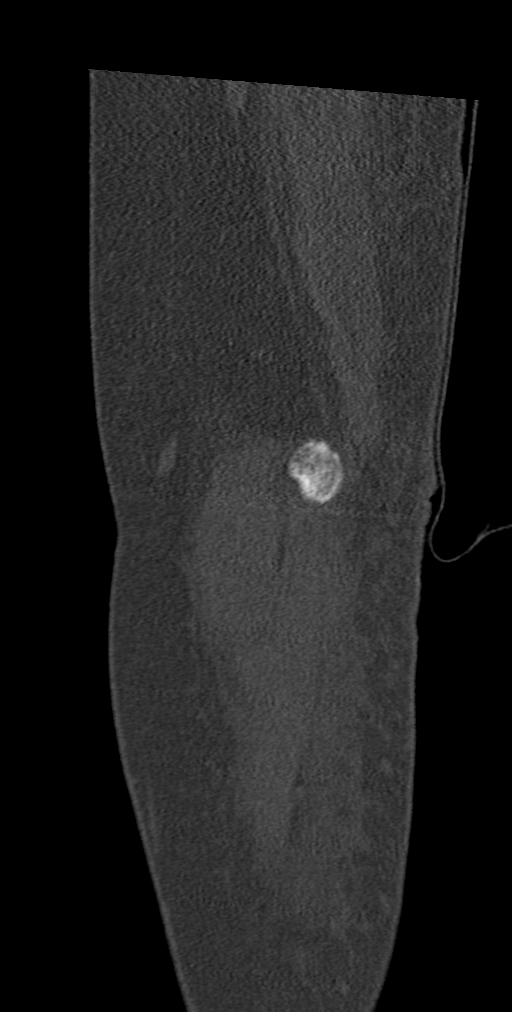
[im 124/149  bone]
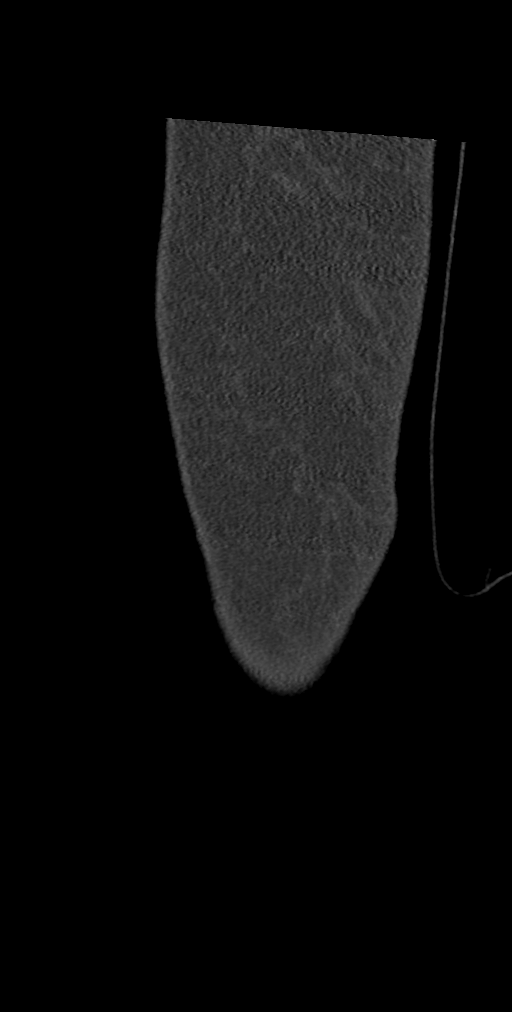

[11 of 36 positions shown; findings below may reference images not displayed]

FINDINGS: Bones/Joint/Cartilage

No fracture or dislocation. Joint spaces are preserved. No joint
effusion.

Ligaments

Ligaments are suboptimally evaluated by CT.

Muscles and Tendons
The distal triceps tendon is poorly seen and likely torn. No muscle
atrophy.

Soft tissue
Prominent posterior elbow soft tissue swelling. No fluid collection
or hematoma. No soft tissue mass.
IMPRESSION: 1. Poor visualization of the distal triceps tendon, likely torn.
Recommend non-contrast MRI of the elbow for further evaluation.
2. No acute osseous abnormality.

## 2022-01-07 ENCOUNTER — Encounter: Payer: Self-pay | Admitting: Physician Assistant

## 2022-01-07 NOTE — Progress Notes (Signed)
Kathleen Le,   You do have 1cm of anterior disc slippage at L5-S1. You do have lots of degenerative changes but no detected change since 2021.

## 2022-01-08 ENCOUNTER — Encounter: Payer: Self-pay | Admitting: Physician Assistant

## 2022-01-08 DIAGNOSIS — S40022D Contusion of left upper arm, subsequent encounter: Secondary | ICD-10-CM

## 2022-01-08 DIAGNOSIS — M25522 Pain in left elbow: Secondary | ICD-10-CM

## 2022-01-08 DIAGNOSIS — M79622 Pain in left upper arm: Secondary | ICD-10-CM

## 2022-01-08 DIAGNOSIS — W19XXXD Unspecified fall, subsequent encounter: Secondary | ICD-10-CM

## 2022-01-08 NOTE — Progress Notes (Signed)
You need to see orthopedic it looks like your tricep tendon could be torn but may need more imaging. Are you ok with referral and do you have any request on location?

## 2022-01-10 ENCOUNTER — Other Ambulatory Visit: Payer: Self-pay | Admitting: Physician Assistant

## 2022-01-13 ENCOUNTER — Encounter: Payer: Self-pay | Admitting: Physician Assistant

## 2022-01-21 ENCOUNTER — Ambulatory Visit: Payer: 59 | Admitting: Orthopaedic Surgery

## 2022-01-21 ENCOUNTER — Encounter: Payer: Self-pay | Admitting: Orthopaedic Surgery

## 2022-01-21 ENCOUNTER — Other Ambulatory Visit: Payer: Self-pay

## 2022-01-21 DIAGNOSIS — S46312A Strain of muscle, fascia and tendon of triceps, left arm, initial encounter: Secondary | ICD-10-CM

## 2022-01-21 MED ORDER — DIAZEPAM 5 MG PO TABS: 10.0000 mg | ORAL_TABLET | Freq: Once | ORAL | 0 refills | Status: AC

## 2022-01-21 NOTE — Progress Notes (Signed)
Office Visit Note   Patient: Kathleen Le           Date of Birth: September 26, 1961           MRN: [id number] Visit Date: 01/21/2022              Requested by: Donella Stade, PA-C [address],  [place] [id number] PCP: Donella Stade, PA-C   Assessment & Plan: Visit Diagnoses:  1. Traumatic rupture of left triceps tendon, initial encounter     Plan: Impression is traumatic left triceps rupture.  We will order an urgent MRI of the elbow at this time.  Valium was sent into her pharmacy for the MRI.  I will contact the patient to discuss further steps after the MRI has been completed.  Follow-Up Instructions: No follow-ups on file.   Orders:  No orders of the defined types were placed in this encounter.  Meds ordered this encounter  Medications   diazepam (VALIUM) 5 MG tablet    Sig: Take 2 tablets (10 mg total) by mouth once for 1 dose.    Dispense:  2 tablet    Refill:  0      Procedures: No procedures performed   Clinical Data: No additional findings.   Subjective: Chief Complaint  Patient presents with   Left Elbow - Pain    Kathleen Le is a very pleasant 61 year old female right-hand-dominant who had a mechanical fall on December 25, 2021 at a dealership and landed directly on the elbow.  She had severe pain and bruising to the area and was initially evaluated at a local urgent care and diagnosed as a contusion.  Unfortunately her strength and function of the elbow did not improve as the swelling in the bruising improved.  She was then evaluated by her primary care doctor who obtained a CT scan which was negative for bony abnormalities but was concerning for triceps rupture.  She was then referred to me for further evaluation.   Review of Systems  Constitutional: Negative.   HENT: Negative.    Eyes: Negative.   Respiratory: Negative.    Cardiovascular: Negative.   Endocrine: Negative.   Musculoskeletal: Negative.   Neurological: Negative.    Hematological: Negative.   Psychiatric/Behavioral: Negative.    All other systems reviewed and are negative.   Objective: Vital Signs: There were no vitals taken for this visit.  Physical Exam Vitals and nursing note reviewed.  Constitutional:      Appearance: She is well-developed.  Pulmonary:     Effort: Pulmonary effort is normal.  Skin:    General: Skin is warm.     Capillary Refill: Capillary refill takes less than 2 seconds.  Neurological:     Mental Status: She is alert and oriented to person, place, and time.  Psychiatric:        Behavior: Behavior normal.        Thought Content: Thought content normal.        Judgment: Judgment normal.    Ortho Exam  Examination of the left elbow shows palpable defect posteriorly just proximal to the olecranon.  She has 2 out of 5 strength with elbow extension.  Elbow flexion is normal.  Passive range of motion is normal.  Neurovascular intact distally.  Specialty Comments:  No specialty comments available.  Imaging: No results found.   PMFS History: Patient Active Problem List   Diagnosis Date Noted   Fall 12/31/2021   Contusion of  left arm 12/31/2021   Acute left-sided low back pain without sciatica 12/31/2021   Neck pain 12/31/2021   Encounter for smoking cessation counseling 11/15/2021   Chronic obstructive pulmonary disease ([place]) 02/26/2021   Hyperlipidemia associated with type 2 diabetes mellitus ([place]) 11/05/2020   Non-restorative sleep 11/05/2020   Snoring 11/05/2020   Anterolisthesis 07/30/2020   Acute stress reaction 09/12/2018   Current smoker 06/08/2018   Diabetes mellitus, type II, insulin dependent ([place]) 06/08/2018   Claustrophobia 06/08/2018   Primary insomnia 06/08/2018   Hot flashes 06/08/2018   Abnormal weight gain 06/08/2018   Lower leg edema 11/10/2017   Insomnia due to other mental disorder 10/27/2017   History of substance abuse ([place]) 10/27/2017   GAD (generalized anxiety disorder) 10/27/2017    Class 3 severe obesity due to excess calories with serious comorbidity and body mass index (BMI) of 40.0 to 44.9 in adult ([place]) 08/31/2017   Cerumen impaction 01/24/2015   Hyperlipidemia 01/24/2015   Gastroesophageal reflux disease with esophagitis 01/10/2015   Foot callus 10/03/2014   PTSD (post-traumatic stress disorder) 09/26/2014   Depression 05/19/2014   Anxiety 05/19/2014   Chronic bilateral low back pain with right-sided sciatica 05/19/2014   DDD (degenerative disc disease), lumbar 05/19/2014   Neuropathy 02/05/2014   IT band syndrome 02/05/2014   Sinus tachycardia 02/05/2014   Memory changes 02/05/2014   Word finding difficulty 02/05/2014   Cervical cancer ([place]) 02/05/2014   Type II diabetes mellitus, uncontrolled 02/03/2014   Unspecified asthma(493.90) 02/03/2014   Essential hypertension, benign 02/03/2014   Pulmonary emphysema ([place]) 07/26/2012   Past Medical History:  Diagnosis Date   Asthma    Diabetes mellitus without complication ([place])    History of alcohol abuse    History of marijuana use    Hypertension    Substance abuse ([place])     Family History  Problem Relation Age of Onset   Cancer Paternal Grandmother    Diabetes Paternal Grandmother    Diabetes Paternal Grandfather    Heart attack Maternal Aunt    Cancer Maternal Uncle    Cancer Maternal Grandmother    Heart attack Mother    Cancer Mother    Stroke Mother    Diabetes Father    Heart attack Father    Diabetes Paternal Uncle    Diabetes Paternal Uncle     Past Surgical History:  Procedure Laterality Date   DNC     Social History   Occupational History   Not on file  Tobacco Use   Smoking status: Every Day    Packs/day: 1.00    Types: Cigarettes   Smokeless tobacco: Never  Substance and Sexual Activity   Alcohol use: No   Drug use: No   Sexual activity: Yes

## 2022-01-22 ENCOUNTER — Telehealth: Payer: Self-pay

## 2022-01-22 NOTE — Telephone Encounter (Signed)
Medication: One Touch Ultra Strips Prior authorization submitted via CoverMyMeds on 01/22/2022 PA submission pending

## 2022-01-25 ENCOUNTER — Other Ambulatory Visit: Payer: Self-pay

## 2022-01-25 ENCOUNTER — Ambulatory Visit
Admission: RE | Admit: 2022-01-25 | Discharge: 2022-01-25 | Disposition: A | Discharge status: Home / Self Care | Payer: 59 | Source: Ambulatory Visit | Attending: Orthopaedic Surgery | Admitting: Orthopaedic Surgery

## 2022-01-25 DIAGNOSIS — S46312A Strain of muscle, fascia and tendon of triceps, left arm, initial encounter: Secondary | ICD-10-CM

## 2022-01-25 DIAGNOSIS — S5002XA Contusion of left elbow, initial encounter: Secondary | ICD-10-CM | POA: Diagnosis not present

## 2022-01-25 IMAGING — MR MR ELBOW*L* W/O CM
4 of 5 series · 14 of 40 positions shown · non-contrast
Comparison: CT [DATE]

CLINICAL DATA: Elbow pain after fall approximately 1 month ago

EXAM:
MRI OF THE LEFT ELBOW WITHOUT CONTRAST
TECHNIQUE: Multiplanar, multisequence MR imaging of the elbow was performed. No
intravenous contrast was administered.

[Series 4: T1 · axial · left · 3.5mm · 0.23mm/px · z∈[+3,+108]mm · 3 of 34 slices shown]
[im 5/34]
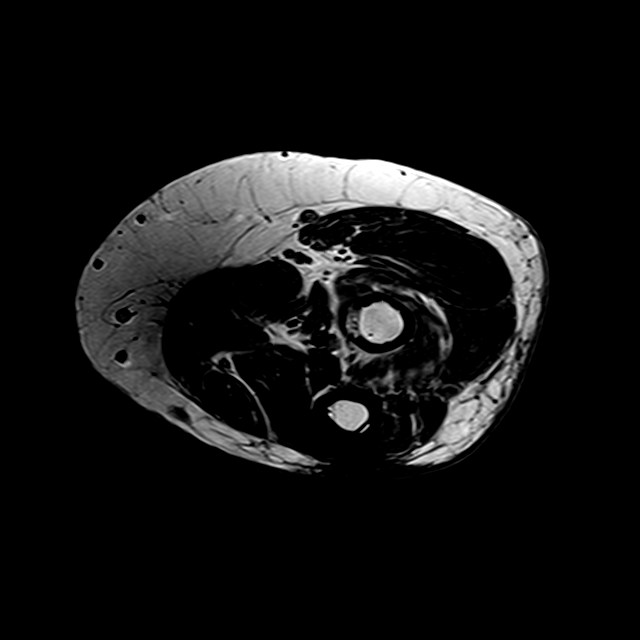
[im 17/34]
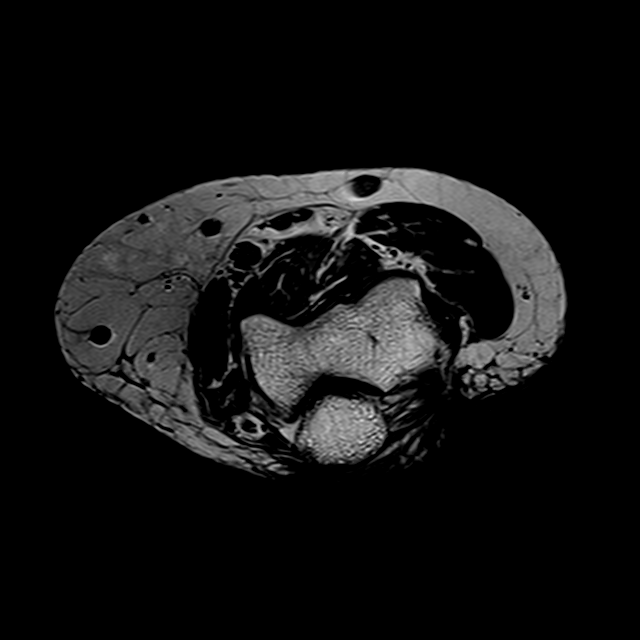
[im 29/34]
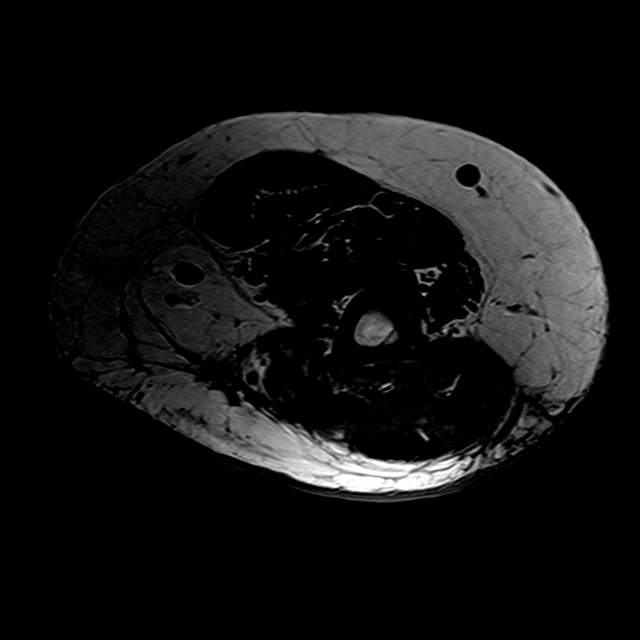

[Series 5: T2 fat-sat · axial · left · 3.5mm · 0.23mm/px · z∈[-11,+111]mm · 5 of 34 slices shown (1 of 3)]
[im 1/34]
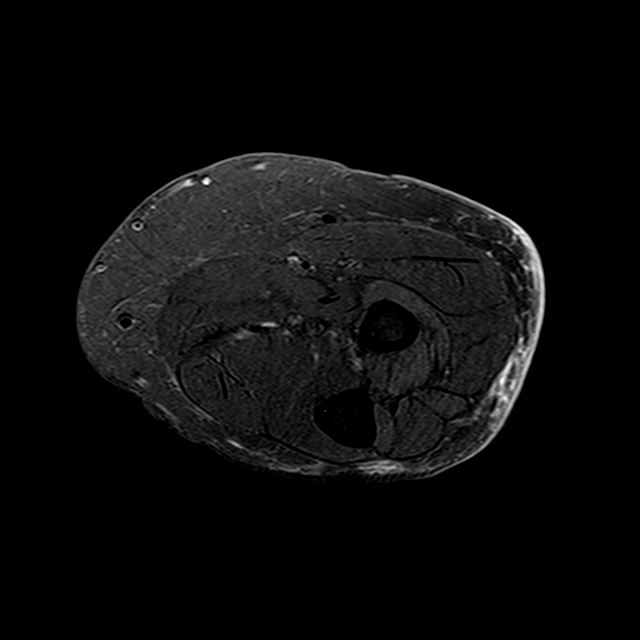
[im 5/34]
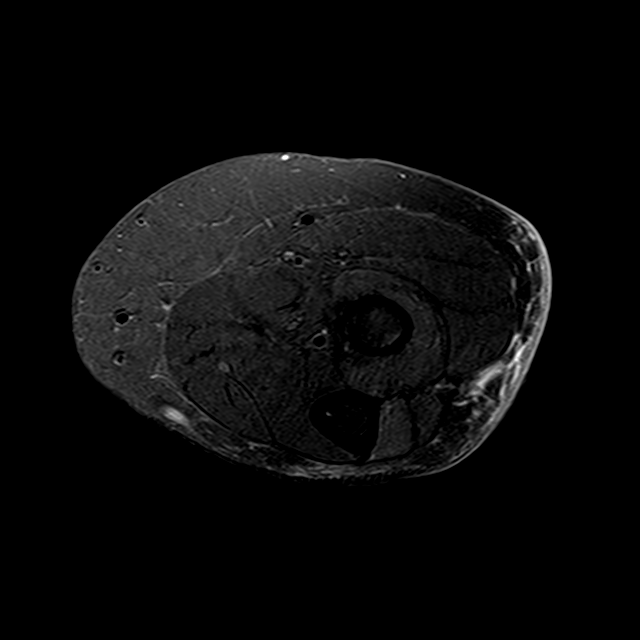
[im 9/34]
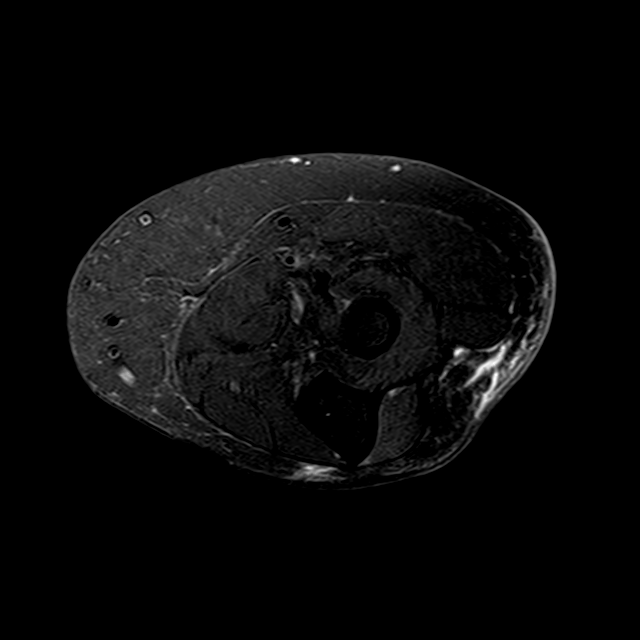
[im 17/34]
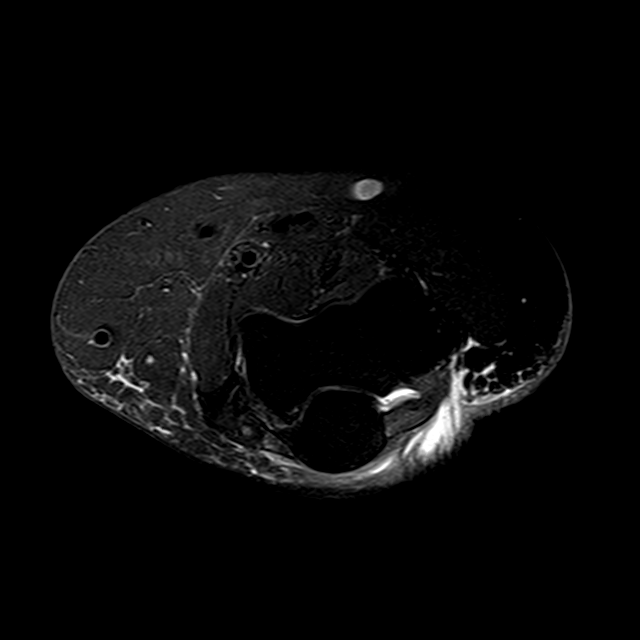
[im 29/34]
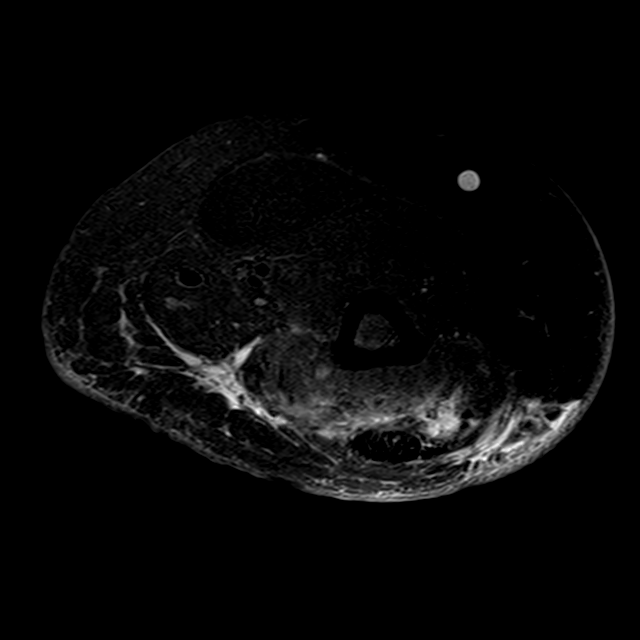

[Series 6: T2 fat-sat · coronal · left · 3.5mm · 0.28mm/px · 3 of 26 slices shown (2 of 3)]
[im 5/26]
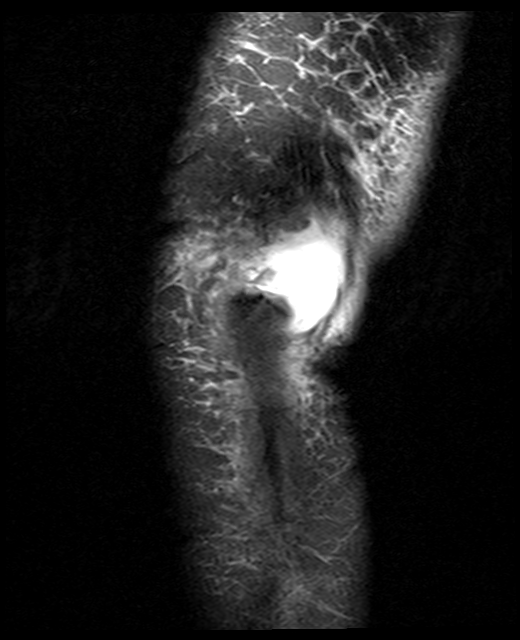
[im 13/26]
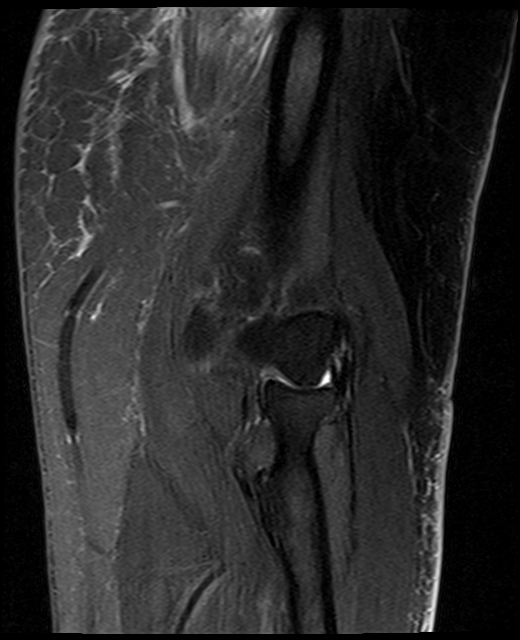
[im 21/26]
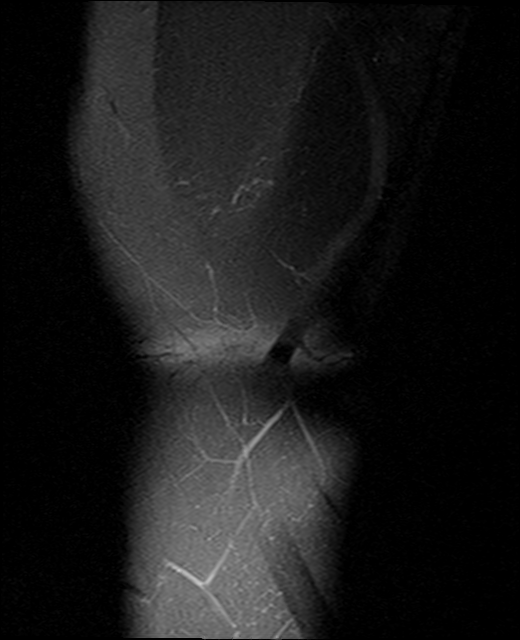

[Series 8: T2 fat-sat · sagittal · left · 3.5mm · 0.37mm/px · 3 of 32 slices shown (3 of 3)]
[im 5/32]
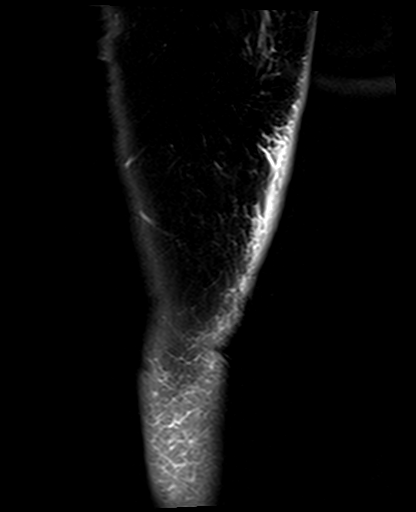
[im 18/32]
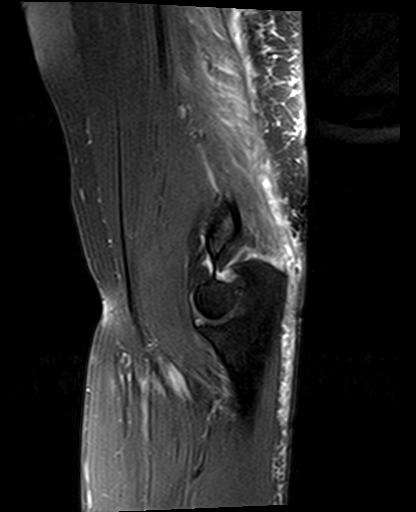
[im 27/32]
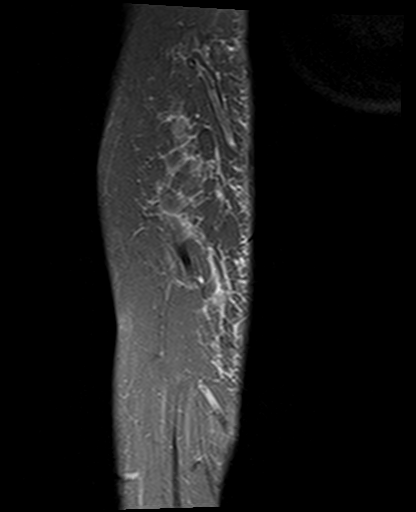

[14 of 40 positions shown; findings below may reference images not displayed]

FINDINGS: TENDONS

Common forearm flexor origin: Intact without tendinosis or tear.

Common forearm extensor origin: Mild tendinosis with
partial-thickness interstitial tears at the common extensor tendon
origin (series 6, images 12-13). No full-thickness or retracted
tear.

Biceps: Intact without tendinosis or tear. No bicipitoradialis
bursal fluid.

Triceps: Complete tear of the distal triceps tendon from its
insertion on the olecranon process with approximately 2.3 cm of
tendon retraction (series 8, images 18-20). Extensive edema within
the visualized distal triceps muscle and along the myotendinous
junction. Surrounding hematoma at the site of tear measuring
approximately 5.2 x 0.9 x 3.3 cm.

LIGAMENTS

Medial stabilizers: Intact.

Lateral stabilizers:  Intact.

Cartilage: No focal chondral defect identified.

Joint: No joint effusion or intra-articular loose body.

Cubital tunnel: Unremarkable.  The ulnar nerve appears normal.

Bones: No acute fracture. No dislocation. No bone marrow edema. No
bone lesion.

Muscles: Normal bulk and signal intensity of the included
musculature.

Soft tissues: Extensive soft tissue edema at the posterior elbow.

Other: None.
IMPRESSION: 1. Complete tear of the distal triceps tendon from its insertion on
the olecranon process with approximately 2.3 cm of tendon
retraction. Extensive edema within the visualized distal triceps
muscle and along the myotendinous junction.
2. Surrounding hematoma at the posterior elbow measuring 5.2 x 0.9 x
3.3 cm.
3. Mild tendinosis with partial-thickness interstitial tears at the
common extensor tendon origin. No full-thickness or retracted tear.
4. No acute fracture or dislocation of the left elbow.

## 2022-01-26 NOTE — Progress Notes (Signed)
Called patient left voicemail to let her know she needs surgery.  Kathleen Le is aware and will call patient Monday morning.

## 2022-01-27 ENCOUNTER — Encounter: Payer: Self-pay | Admitting: Orthopaedic Surgery

## 2022-01-27 NOTE — Telephone Encounter (Signed)
Does patient have an accu chek maching? If so ok to send strips.

## 2022-01-27 NOTE — Telephone Encounter (Signed)
Medication: One Touch Ultra Strips Prior authorization determination received Medication has been denied Reason for denial:  "The policy states that this medication may be approved when: - The member has a clinical condition or needs a specific dosage form for which there is no alternative onthe formulary OR - The listed formulary alternatives are not recommended based on published guidelines or clinicalliterature OR - The formulary alternatives will likely be ineffective or less effective for the member OR - The formulary alternatives will likely cause an adverse effect OR - The member is unable to take the required number of formulary alternatives for the given diagnosis due to a trial and inadequate treatment response or contraindication OR - The member has tried and failed the required number of formulary alternatives.  Formulary alternative(s) are Accu-Chek. "

## 2022-01-29 ENCOUNTER — Other Ambulatory Visit: Payer: Self-pay | Admitting: Physician Assistant

## 2022-01-29 ENCOUNTER — Encounter: Payer: Self-pay | Admitting: Physician Assistant

## 2022-01-29 ENCOUNTER — Other Ambulatory Visit: Payer: Self-pay

## 2022-01-29 DIAGNOSIS — E1165 Type 2 diabetes mellitus with hyperglycemia: Secondary | ICD-10-CM

## 2022-01-29 MED ORDER — GLUCOSE BLOOD VI STRP: ORAL_STRIP | 12 refills | Status: DC

## 2022-01-31 NOTE — Pre-Procedure Instructions (Signed)
Surgical Instructions    Your procedure is scheduled on Friday 02/07/22.   Report to [facility] Main Entrance "A" at 12:45 P.M., then check in with the Admitting office.  Call this number if you have problems the morning of surgery:  [phone]   If you have any questions prior to your surgery date call [phone]: Open Monday-Friday 8am-4pm    Remember:  Do not eat after midnight the night before your surgery  You may drink clear liquids until 11:45 A.M. the morning of your surgery.   Clear liquids allowed are: Water, Non-Citrus Juices (without pulp), Carbonated Beverages, Clear Tea, Black Coffee ONLY (NO MILK, CREAM OR POWDERED CREAMER of any kind), and Gatorade  Patient Instructions  The night before surgery:  No food after midnight. ONLY clear liquids after midnight  The day of surgery (if you have diabetes): Drink ONE (1) 12 oz G2 given to you in your pre admission testing appointment by 11:45 A.M. the morning of surgery. Drink in one sitting. Do not sip.  This drink was given to you during your hospital  pre-op appointment visit.  Nothing else to drink after completing the  12 oz bottle of G2.         If you have questions, please contact your surgeons office.     Take these medicines the morning of surgery with A SIP OF WATER:   amLODipine (NORVASC)   atenolol (TENORMIN)  budesonide-formoterol (SYMBICORT)   DULoxetine (CYMBALTA)  esomeprazole (NEXIUM)  rosuvastatin (CRESTOR)     Take these medicines if needed:   albuterol (PROVENTIL)   As of today, STOP taking any Aspirin (unless otherwise instructed by your surgeon) meloxicam (MOBIC), Aleve, Naproxen, Ibuprofen, Motrin, Advil, Goody's, BC's, all herbal medications, fish oil, and all vitamins.   HOW TO MANAGE YOUR DIABETES BEFORE AND AFTER SURGERY  Why is it important to control my blood sugar before and after surgery? Improving blood sugar levels before and after surgery helps healing and can limit  problems. A way of improving blood sugar control is eating a healthy diet by:  Eating less sugar and carbohydrates  Increasing activity/exercise  Talking with your doctor about reaching your blood sugar goals High blood sugars (greater than 180 mg/dL) can raise your risk of infections and slow your recovery, so you will need to focus on controlling your diabetes during the weeks before surgery. Make sure that the doctor who takes care of your diabetes knows about your planned surgery including the date and location.  How do I manage my blood sugar before surgery? Check your blood sugar at least 4 times a day, starting 2 days before surgery, to make sure that the level is not too high or low. Check your blood sugar the morning of your surgery when you wake up and every 2 hours until you get to the Short Stay unit. If your blood sugar is less than 70 mg/dL, you will need to treat for low blood sugar: Do not take insulin. Treat a low blood sugar (less than 70 mg/dL) with  cup of clear juice (cranberry or apple), 4 glucose tablets, OR glucose gel. Recheck blood sugar in 15 minutes after treatment (to make sure it is greater than 70 mg/dL). If your blood sugar is not greater than 70 mg/dL on recheck, call [phone]  for further instructions. Report your blood sugar to the short stay nurse when you get to Short Stay.  If you are admitted to the hospital after surgery: Your blood sugar  will be checked by the staff and you will probably be given insulin after surgery (instead of oral diabetes medicines) to make sure you have good blood sugar levels. The goal for blood sugar control after surgery is 80-180 mg/dL.     WHAT DO I DO ABOUT MY DIABETES MEDICATION?   Do not take oral diabetes medicines (pills):  the morning of surgery.  DO NOT TAKE glipiZIDE (GLUCOTROL) the morning of surgery.   DO NOT TAKE metFORMIN (GLUCOPHAGE) the morning of surgery.   DO NOT TAKE pioglitazone (ACTOS) the  morning of surgery.   DO NOT TAKE sitaGLIPtin (JANUVIA) the morning of surgery.   THE NIGHT BEFORE SURGERY, take half (10 units) of insulin NPH Human (NOVOLIN N RELION).     The day of surgery, do not take other diabetes injectables, including Byetta (exenatide), Bydureon (exenatide ER), Victoza (liraglutide), or Trulicity (dulaglutide).   After your COVID test   You are not required to quarantine however you are required to wear a well-fitting mask when you are out and around people not in your household.  If your mask becomes wet or soiled, replace with a new one.  Wash your hands often with soap and water for 20 seconds or clean your hands with an alcohol-based hand sanitizer that contains at least 60% alcohol.  Do not share personal items.  Notify your provider: if you are in close contact with someone who has COVID  or if you develop a fever of 100.4 or greater, sneezing, cough, sore throat, shortness of breath or body aches.           Do not wear jewelry or makeup Do not wear lotions, powders, perfumes/colognes, or deodorant. Do not shave 48 hours prior to surgery.  Men may shave face and neck. Do not bring valuables to the hospital. Do not wear nail polish, gel polish, artificial nails, or any other type of covering on natural nails (fingers and toes) If you have artificial nails or gel coating that need to be removed by a nail salon, please have this removed prior to surgery. Artificial nails or gel coating may interfere with anesthesia's ability to adequately monitor your vital signs.             [facility] is not responsible for any belongings or valuables.  Do NOT Smoke (Tobacco/Vaping)  24 hours prior to your procedure  If you use a CPAP at night, you may bring your mask for your overnight stay.   Contacts, glasses, hearing aids, dentures or partials may not be worn into surgery, please bring cases for these belongings   For patients admitted to the hospital,  discharge time will be determined by your treatment team.   Patients discharged the day of surgery will not be allowed to drive home, and someone needs to stay with them for 24 hours.  NO VISITORS WILL BE ALLOWED IN PRE-OP WHERE PATIENTS ARE PREPPED FOR SURGERY.  ONLY 1 SUPPORT PERSON MAY BE PRESENT IN THE WAITING ROOM WHILE YOU ARE IN SURGERY.  IF YOU ARE TO BE ADMITTED, ONCE YOU ARE IN YOUR ROOM YOU WILL BE ALLOWED TWO (2) VISITORS. 1 (ONE) VISITOR MAY STAY OVERNIGHT BUT MUST ARRIVE TO THE ROOM BY 8pm.  Minor children may have two parents present. Special consideration for safety and communication needs will be reviewed on a case by case basis.  Special instructions:    Oral Hygiene is also important to reduce your risk of infection.  Remember - BRUSH  YOUR TEETH THE MORNING OF SURGERY WITH YOUR REGULAR TOOTHPASTE   [facility]- Preparing For Surgery  Before surgery, you can play an important role. Because skin is not sterile, your skin needs to be as free of germs as possible. You can reduce the number of germs on your skin by washing with CHG (chlorahexidine gluconate) Soap before surgery.  CHG is an antiseptic cleaner which kills germs and bonds with the skin to continue killing germs even after washing.     Please do not use if you have an allergy to CHG or antibacterial soaps. If your skin becomes reddened/irritated stop using the CHG.  Do not shave (including legs and underarms) for at least 48 hours prior to first CHG shower. It is OK to shave your face.  Please follow these instructions carefully.     Shower the NIGHT BEFORE SURGERY and the MORNING OF SURGERY with CHG Soap.   If you chose to wash your hair, wash your hair first as usual with your normal shampoo. After you shampoo, rinse your hair and body thoroughly to remove the shampoo.  Then [employer] and genitals (private parts) with your normal soap and rinse thoroughly to remove soap.  After that Use CHG Soap as you would any  other liquid soap. You can apply CHG directly to the skin and wash gently with a scrungie or a clean washcloth.   Apply the CHG Soap to your body ONLY FROM THE NECK DOWN.  Do not use on open wounds or open sores. Avoid contact with your eyes, ears, mouth and genitals (private parts). Wash Face and genitals (private parts)  with your normal soap.   Wash thoroughly, paying special attention to the area where your surgery will be performed.  Thoroughly rinse your body with warm water from the neck down.  DO NOT shower/wash with your normal soap after using and rinsing off the CHG Soap.  Pat yourself dry with a CLEAN TOWEL.  Wear CLEAN PAJAMAS to bed the night before surgery  Place CLEAN SHEETS on your bed the night before your surgery  DO NOT SLEEP WITH PETS.   Day of Surgery:  Take a shower with CHG soap. Wear Clean/Comfortable clothing the morning of surgery Do not apply any deodorants/lotions.   Remember to brush your teeth WITH YOUR REGULAR TOOTHPASTE.   Please read over the following fact sheets that you were given.

## 2022-02-01 ENCOUNTER — Other Ambulatory Visit: Payer: Self-pay | Admitting: Physician Assistant

## 2022-02-03 ENCOUNTER — Encounter (HOSPITAL_COMMUNITY): Payer: Self-pay

## 2022-02-03 ENCOUNTER — Encounter (HOSPITAL_COMMUNITY)
Admission: RE | Admit: 2022-02-03 | Discharge: 2022-02-03 | Disposition: A | Discharge status: Home / Self Care | Payer: 59 | Source: Ambulatory Visit | Attending: Orthopaedic Surgery | Admitting: Orthopaedic Surgery

## 2022-02-03 ENCOUNTER — Other Ambulatory Visit: Payer: Self-pay

## 2022-02-03 VITALS — BP 139/75 | HR 76 | Temp 98.3°F | Resp 17 | Ht 66.0 in | Wt 272.5 lb

## 2022-02-03 DIAGNOSIS — S46312A Strain of muscle, fascia and tendon of triceps, left arm, initial encounter: Secondary | ICD-10-CM | POA: Insufficient documentation

## 2022-02-03 DIAGNOSIS — Z794 Long term (current) use of insulin: Secondary | ICD-10-CM | POA: Diagnosis not present

## 2022-02-03 DIAGNOSIS — Z6841 Body Mass Index (BMI) 40.0 and over, adult: Secondary | ICD-10-CM | POA: Insufficient documentation

## 2022-02-03 DIAGNOSIS — K219 Gastro-esophageal reflux disease without esophagitis: Secondary | ICD-10-CM | POA: Insufficient documentation

## 2022-02-03 DIAGNOSIS — E119 Type 2 diabetes mellitus without complications: Secondary | ICD-10-CM | POA: Diagnosis not present

## 2022-02-03 DIAGNOSIS — Z01818 Encounter for other preprocedural examination: Secondary | ICD-10-CM | POA: Diagnosis not present

## 2022-02-03 DIAGNOSIS — J449 Chronic obstructive pulmonary disease, unspecified: Secondary | ICD-10-CM | POA: Diagnosis not present

## 2022-02-03 DIAGNOSIS — I10 Essential (primary) hypertension: Secondary | ICD-10-CM | POA: Insufficient documentation

## 2022-02-03 DIAGNOSIS — F17210 Nicotine dependence, cigarettes, uncomplicated: Secondary | ICD-10-CM | POA: Insufficient documentation

## 2022-02-03 DIAGNOSIS — X58XXXA Exposure to other specified factors, initial encounter: Secondary | ICD-10-CM | POA: Diagnosis not present

## 2022-02-03 DIAGNOSIS — R69 Illness, unspecified: Secondary | ICD-10-CM | POA: Diagnosis not present

## 2022-02-03 HISTORY — DX: Other intervertebral disc degeneration, lumbar region without mention of lumbar back pain or lower extremity pain: M51.369

## 2022-02-03 HISTORY — DX: Pneumonia, unspecified organism: J18.9

## 2022-02-03 HISTORY — DX: Dyspnea, unspecified: R06.00

## 2022-02-03 HISTORY — DX: Anxiety disorder, unspecified: F41.9

## 2022-02-03 HISTORY — DX: Unspecified osteoarthritis, unspecified site: M19.90

## 2022-02-03 HISTORY — DX: Chronic obstructive pulmonary disease, unspecified: J44.9

## 2022-02-03 HISTORY — DX: Other intervertebral disc degeneration, lumbar region: M51.36

## 2022-02-03 HISTORY — DX: Malignant (primary) neoplasm, unspecified: C80.1

## 2022-02-03 HISTORY — DX: Gastro-esophageal reflux disease without esophagitis: K21.9

## 2022-02-03 LAB — BASIC METABOLIC PANEL
Anion gap: 11 (ref 5–15)
BUN: 21 mg/dL — ABNORMAL HIGH (ref 6–20)
CO2: 29 mmol/L (ref 22–32)
Calcium: 9.2 mg/dL (ref 8.9–10.3)
Chloride: 100 mmol/L (ref 98–111)
Creatinine, Ser: 1.08 mg/dL — ABNORMAL HIGH (ref 0.44–1.00)
GFR, Estimated: 60 mL/min — ABNORMAL LOW (ref >60)
Glucose, Bld: 220 mg/dL — ABNORMAL HIGH (ref 70–99)
Potassium: 4.1 mmol/L (ref 3.5–5.1)
Sodium: 140 mmol/L (ref 135–145)

## 2022-02-03 LAB — CBC
HCT: 43.5 % (ref 36.0–46.0)
Hemoglobin: 14.3 g/dL (ref 12.0–15.0)
MCH: 31 pg (ref 26.0–34.0)
MCHC: 32.9 g/dL (ref 30.0–36.0)
MCV: 94.2 fL (ref 80.0–100.0)
Platelets: 286 10*3/uL (ref 150–400)
RBC: 4.62 MIL/uL (ref 3.87–5.11)
RDW: 14.4 % (ref 11.5–15.5)
WBC: 6.4 10*3/uL (ref 4.0–10.5)
nRBC: 0 % (ref 0.0–0.2)

## 2022-02-03 LAB — GLUCOSE, CAPILLARY: Glucose-Capillary: 214 mg/dL — ABNORMAL HIGH (ref 70–99)

## 2022-02-03 LAB — HEMOGLOBIN A1C
Hgb A1c MFr Bld: 8 % — ABNORMAL HIGH (ref 4.8–5.6)
Mean Plasma Glucose: 182.9 mg/dL

## 2022-02-03 NOTE — Progress Notes (Signed)
PCP - Dr. Alden Hipp Cardiologist - patient denies  PPM/ICD - n/a Device Orders -  Rep Notified -   Chest x-ray -  EKG - 02/03/22 Stress Test - patient denies ECHO - patient denies Cardiac Cath - patient denies  Sleep Study - n/a CPAP -   Fasting Blood Sugar - "under 125" Checks Blood Sugar "mainly when I start feeling funny"  Blood Thinner Instructions: Aspirin Instructions:  ERAS Protcol - clears until 11:45 PRE-SURGERY Ensure or G2- G2 given   COVID TEST- n/a ambulatory surgery   Anesthesia review: n/a  Patient denies shortness of breath, fever, cough and chest pain at PAT appointment   All instructions explained to the patient, with a verbal understanding of the material. Patient agrees to go over the instructions while at home for a better understanding. Patient also instructed to self quarantine after being tested for COVID-19. The opportunity to ask questions was provided.

## 2022-02-04 NOTE — Anesthesia Preprocedure Evaluation (Addendum)
Anesthesia Evaluation  Patient identified by MRN, date of birth, ID band Patient awake    Reviewed: Allergy & Precautions, NPO status , Patient's Chart, lab work & pertinent test results  Airway Mallampati: III  TM Distance: >3 FB Neck ROM: Full    Dental  (+) Teeth Intact, Dental Advisory Given   Pulmonary asthma , COPD, Current Smoker and Patient abstained from smoking.,    Pulmonary exam normal breath sounds clear to auscultation       Cardiovascular hypertension, Pt. on medications Normal cardiovascular exam Rhythm:Regular Rate:Normal     Neuro/Psych PSYCHIATRIC DISORDERS Anxiety Depression  Neuromuscular disease    GI/Hepatic GERD  ,(+)     substance abuse  alcohol use and marijuana use,   Endo/Other  diabetes, Type 2, Oral Hypoglycemic Agents, Insulin DependentMorbid obesity  Renal/GU negative Renal ROS     Musculoskeletal  (+) Arthritis ,   Abdominal   Peds  Hematology negative hematology ROS (+)   Anesthesia Other Findings Day of surgery medications reviewed with the patient.  Reproductive/Obstetrics                            Anesthesia Physical Anesthesia Plan  ASA: 3  Anesthesia Plan: General   Post-op Pain Management: Tylenol PO (pre-op) and Regional block   Induction: Intravenous  PONV Risk Score and Plan: 2 and Midazolam, Dexamethasone and Ondansetron  Airway Management Planned: LMA  Additional Equipment:   Intra-op Plan:   Post-operative Plan: Extubation in OR  Informed Consent: I have reviewed the patients History and Physical, chart, labs and discussed the procedure including the risks, benefits and alternatives for the proposed anesthesia with the patient or authorized representative who has indicated his/her understanding and acceptance.     Dental advisory given  Plan Discussed with: CRNA  Anesthesia Plan Comments: (PAT note written 02/04/2022 by  Myra Gianotti, PA-C. )       Anesthesia Quick Evaluation

## 2022-02-04 NOTE — Progress Notes (Signed)
Anesthesia Chart Review:  Case: [id number] Date/Time: 02/07/22 1430   Procedure: left triceps repair (Left)   Anesthesia type: General   Pre-op diagnosis: left triceps rupture   Location: [facility] OR ROOM 04 / [facility]   Surgeons: Leandrew Koyanagi, MD       DISCUSSION: Patient will be a 61 year old female by her surgery date. She his scheduled for left triceps repair by Dr. Erlinda Hong.  She injured her left elbow after tripping over a rug on 12/24/2021.  01/25/2022 MRI showed complete tear of the distal triceps tendon.  Other history includes smoking, HTN, DM2, asthma, COPD, DOE, GERD, polysubstance use (no longer using THC due to COPD; alcohol abuse was during divorce > 20 years ago, no current use), cancer (h/o "cervical" cancer by 02/05/14 note).  BMI is consistent with morbid obesity.  A1c 02/03/22 was 8.0%.  She does not consistently check home CBGs but reports fasting results typically under 125.  She denies shortness of breath, cough, fever, chest pain at PAT RN visit.  Anesthesia team to evaluate on the day of surgery.   VS: BP 139/75    Pulse 76    Temp 36.8 C (Oral)    Resp 17    Ht 5\' 6"  (1.676 m)    Wt 123.6 kg    SpO2 98%    BMI 43.98 kg/m  "Postmenopausal"  PROVIDERS: Donella Stade, PA-C is PCP    LABS: Preoperative labs noted and routed to Dr. Erlinda Hong. She will get a CBG on the day of surgery.   (all labs ordered are listed, but only abnormal results are displayed)  Labs Reviewed  GLUCOSE, CAPILLARY - Abnormal; Notable for the following components:      Result Value   Glucose-Capillary 214 (*)    All other components within normal limits  HEMOGLOBIN A1C - Abnormal; Notable for the following components:   Hgb A1c MFr Bld 8.0 (*)    All other components within normal limits  BASIC METABOLIC PANEL - Abnormal; Notable for the following components:   Glucose, Bld 220 (*)    BUN 21 (*)    Creatinine, Ser 1.08 (*)    GFR, Estimated 60 (*)    All other components within normal limits  CBC      IMAGES: MRI Left elbow 01/25/22: IMPRESSION: 1. Complete tear of the distal triceps tendon from its insertion on the olecranon process with approximately 2.3 cm of tendon retraction. Extensive edema within the visualized distal triceps muscle and along the myotendinous junction. 2. Surrounding hematoma at the posterior elbow measuring 5.2 x 0.9 x 3.3 cm. 3. Mild tendinosis with partial-thickness interstitial tears at the common extensor tendon origin. No full-thickness or retracted tear. 4. No acute fracture or dislocation of the left elbow.   CXR 12/12/21: FINDINGS: The cardiomediastinal silhouette is within normal limits. The lungs are mildly hyperinflated. There is moderate premarked no thickening. No focal airspace consolidation, overt pulmonary edema, sizable pleural effusion, or pneumothorax is identified. No acute osseous abnormality is seen. IMPRESSION: Bronchitic changes/COPD.    EKG: 02/03/22: Normal sinus rhythm Possible Anterior infarct , age undetermined Abnormal ECG No previous ECGs available Confirmed by Croitoru, Mihai [phone]) on 02/03/2022 8:30:12 PM   CV: N/A  Past Medical History:  Diagnosis Date   Anxiety    Arthritis    Asthma    Cancer ([place])    COPD (chronic obstructive pulmonary disease) ([place])    Degenerative disc disease, lumbar    Diabetes mellitus without complication (  [place])    Dyspnea    with exertion due to COPD   GERD (gastroesophageal reflux disease)    History of alcohol abuse    20-30 years ago, patient was going through divorce but does not currently drink any alcohol   History of marijuana use    "its been a long time since I last smoked because of my COPD"   History of ovarian cancer 1984   Hypertension    Pneumonia    Substance abuse ([place])    patient denies, states she has only ever used marijuana    Past Surgical History:  Procedure Laterality Date   DILATION AND CURETTAGE OF UTERUS     ECTOPIC PREGNANCY SURGERY      patient stated she had to have tube removed    MEDICATIONS:  albuterol (PROVENTIL) (2.5 MG/3ML) 0.083% nebulizer solution   albuterol (VENTOLIN HFA) 108 (90 Base) MCG/ACT inhaler   AMBULATORY NON FORMULARY MEDICATION   AMBULATORY NON FORMULARY MEDICATION   amLODipine (NORVASC) 5 MG tablet   aspirin 81 MG tablet   atenolol (TENORMIN) 50 MG tablet   budesonide-formoterol (SYMBICORT) 160-4.5 MCG/ACT inhaler   Calcium Carbonate (CALCARB 600 PO)   doxepin (SINEQUAN) 25 MG capsule   DULoxetine (CYMBALTA) 30 MG capsule   esomeprazole (NEXIUM) 20 MG capsule   Flaxseed, Linseed, (FLAX SEED OIL) 1000 MG CAPS   glipiZIDE (GLUCOTROL) 10 MG tablet   glucose blood test strip   glucose blood test strip   hydrochlorothiazide (HYDRODIURIL) 25 MG tablet   ibuprofen (ADVIL) 600 MG tablet   insulin NPH Human (NOVOLIN N RELION) 100 UNIT/ML injection   Insulin Syringe-Needle U-100 (INSULIN SYRINGE .3CC/31GX5/16") 31G X 5/16" 0.3 ML MISC   Lancets 30G MISC   lisinopril (ZESTRIL) 5 MG tablet   magnesium gluconate (MAGONATE) 500 MG tablet   meloxicam (MOBIC) 15 MG tablet   metFORMIN (GLUCOPHAGE) 1000 MG tablet   Multiple Vitamins-Minerals (MULTIVITAMIN WITH MINERALS) tablet   nicotine (NICODERM CQ) 21 mg/24hr patch   nystatin (MYCOSTATIN) 100000 UNIT/ML suspension   Omega-3 Fatty Acids (FISH OIL) 1000 MG CAPS   OVER THE COUNTER MEDICATION   pioglitazone (ACTOS) 30 MG tablet   Probiotic Product (PROBIOTIC DAILY PO)   rosuvastatin (CRESTOR) 20 MG tablet   sitaGLIPtin (JANUVIA) 100 MG tablet   tiZANidine (ZANAFLEX) 4 MG tablet   No current facility-administered medications for this encounter.    Myra Gianotti, PA-C Surgical Short Stay/Anesthesiology [facility] Phone [phone] [facility] Phone [phone] 02/04/2022 5:22 PM

## 2022-02-06 MED ORDER — CEFAZOLIN IN SODIUM CHLORIDE 3-0.9 GM/100ML-% IV SOLN
3.0000 g | INTRAVENOUS | Status: DC
  Filled 2022-02-06 (×2): qty 100

## 2022-02-07 ENCOUNTER — Encounter (HOSPITAL_COMMUNITY)
Admission: RE | Disposition: A | Discharge status: Home / Self Care | Payer: Self-pay | Source: Home / Self Care | Attending: Orthopaedic Surgery

## 2022-02-07 ENCOUNTER — Other Ambulatory Visit: Payer: Self-pay

## 2022-02-07 ENCOUNTER — Ambulatory Visit (HOSPITAL_BASED_OUTPATIENT_CLINIC_OR_DEPARTMENT_OTHER): Payer: 59 | Admitting: Anesthesiology

## 2022-02-07 ENCOUNTER — Ambulatory Visit (HOSPITAL_COMMUNITY)
Admission: RE | Admit: 2022-02-07 | Discharge: 2022-02-07 | Disposition: A | Discharge status: Home / Self Care | Payer: 59 | Attending: Orthopaedic Surgery | Admitting: Orthopaedic Surgery

## 2022-02-07 ENCOUNTER — Ambulatory Visit (HOSPITAL_COMMUNITY): Payer: 59 | Admitting: Vascular Surgery

## 2022-02-07 ENCOUNTER — Encounter (HOSPITAL_COMMUNITY): Payer: Self-pay | Admitting: Orthopaedic Surgery

## 2022-02-07 ENCOUNTER — Ambulatory Visit (HOSPITAL_COMMUNITY): Payer: 59

## 2022-02-07 DIAGNOSIS — K219 Gastro-esophageal reflux disease without esophagitis: Secondary | ICD-10-CM | POA: Diagnosis not present

## 2022-02-07 DIAGNOSIS — E119 Type 2 diabetes mellitus without complications: Secondary | ICD-10-CM | POA: Diagnosis not present

## 2022-02-07 DIAGNOSIS — Z7984 Long term (current) use of oral hypoglycemic drugs: Secondary | ICD-10-CM | POA: Diagnosis not present

## 2022-02-07 DIAGNOSIS — S46312A Strain of muscle, fascia and tendon of triceps, left arm, initial encounter: Secondary | ICD-10-CM

## 2022-02-07 DIAGNOSIS — Z791 Long term (current) use of non-steroidal anti-inflammatories (NSAID): Secondary | ICD-10-CM | POA: Diagnosis not present

## 2022-02-07 DIAGNOSIS — Z794 Long term (current) use of insulin: Secondary | ICD-10-CM | POA: Diagnosis not present

## 2022-02-07 DIAGNOSIS — I10 Essential (primary) hypertension: Secondary | ICD-10-CM

## 2022-02-07 DIAGNOSIS — W19XXXA Unspecified fall, initial encounter: Secondary | ICD-10-CM | POA: Insufficient documentation

## 2022-02-07 DIAGNOSIS — F17210 Nicotine dependence, cigarettes, uncomplicated: Secondary | ICD-10-CM | POA: Diagnosis not present

## 2022-02-07 DIAGNOSIS — F419 Anxiety disorder, unspecified: Secondary | ICD-10-CM | POA: Insufficient documentation

## 2022-02-07 DIAGNOSIS — J449 Chronic obstructive pulmonary disease, unspecified: Secondary | ICD-10-CM | POA: Diagnosis not present

## 2022-02-07 DIAGNOSIS — G8918 Other acute postprocedural pain: Secondary | ICD-10-CM | POA: Diagnosis not present

## 2022-02-07 DIAGNOSIS — Z7951 Long term (current) use of inhaled steroids: Secondary | ICD-10-CM | POA: Insufficient documentation

## 2022-02-07 DIAGNOSIS — R69 Illness, unspecified: Secondary | ICD-10-CM | POA: Diagnosis not present

## 2022-02-07 HISTORY — PX: TRICEPS TENDON REPAIR: SHX2577

## 2022-02-07 HISTORY — DX: Strain of muscle, fascia and tendon of triceps, left arm, initial encounter: S46.312A

## 2022-02-07 LAB — GLUCOSE, CAPILLARY
Glucose-Capillary: 180 mg/dL — ABNORMAL HIGH (ref 70–99)
Glucose-Capillary: 190 mg/dL — ABNORMAL HIGH (ref 70–99)

## 2022-02-07 SURGERY — REPAIR, TENDON, TRICEPS
Anesthesia: General | Site: Arm Upper | Laterality: Left

## 2022-02-07 MED ORDER — FENTANYL CITRATE (PF) 100 MCG/2ML IJ SOLN: 50.0000 ug | Freq: Once | INTRAMUSCULAR | Status: AC

## 2022-02-07 MED ORDER — CHLORHEXIDINE GLUCONATE 0.12 % MT SOLN
15.0000 mL | Freq: Once | OROMUCOSAL | Status: AC
  Administered 2022-02-07: 15 mL via OROMUCOSAL
  Filled 2022-02-07: qty 15

## 2022-02-07 MED ORDER — ONDANSETRON HCL 4 MG/2ML IJ SOLN
INTRAMUSCULAR | Status: DC | PRN
  Administered 2022-02-07: 4 mg via INTRAVENOUS

## 2022-02-07 MED ORDER — ROPIVACAINE HCL 5 MG/ML IJ SOLN
INTRAMUSCULAR | Status: DC | PRN
  Administered 2022-02-07: 30 mL via PERINEURAL

## 2022-02-07 MED ORDER — DEXAMETHASONE SODIUM PHOSPHATE 10 MG/ML IJ SOLN
INTRAMUSCULAR | Status: DC | PRN
Start: 2022-02-07 — End: 2022-02-07
  Administered 2022-02-07: 10 mg via INTRAVENOUS

## 2022-02-07 MED ORDER — LACTATED RINGERS IV SOLN: INTRAVENOUS | Status: DC

## 2022-02-07 MED ORDER — SUCCINYLCHOLINE CHLORIDE 200 MG/10ML IV SOSY
PREFILLED_SYRINGE | INTRAVENOUS | Status: AC
  Filled 2022-02-07: qty 20

## 2022-02-07 MED ORDER — INSULIN ASPART 100 UNIT/ML IJ SOLN
0.0000 [IU] | INTRAMUSCULAR | Status: DC | PRN
  Administered 2022-02-07: 4 [IU] via SUBCUTANEOUS
  Filled 2022-02-07: qty 1

## 2022-02-07 MED ORDER — FENTANYL CITRATE (PF) 100 MCG/2ML IJ SOLN
INTRAMUSCULAR | Status: AC
  Administered 2022-02-07: 50 ug via INTRAVENOUS
  Filled 2022-02-07: qty 2

## 2022-02-07 MED ORDER — MIDAZOLAM HCL 2 MG/2ML IJ SOLN: 1.0000 mg | Freq: Once | INTRAMUSCULAR | Status: AC

## 2022-02-07 MED ORDER — FENTANYL CITRATE (PF) 250 MCG/5ML IJ SOLN
INTRAMUSCULAR | Status: DC | PRN
  Administered 2022-02-07: 50 ug via INTRAVENOUS

## 2022-02-07 MED ORDER — DEXTROSE 5 % IV SOLN
INTRAVENOUS | Status: DC | PRN
  Administered 2022-02-07: 3 g via INTRAVENOUS

## 2022-02-07 MED ORDER — MIDAZOLAM HCL 2 MG/2ML IJ SOLN
INTRAMUSCULAR | Status: AC
  Administered 2022-02-07: 1 mg via INTRAVENOUS
  Filled 2022-02-07: qty 2

## 2022-02-07 MED ORDER — PROPOFOL 10 MG/ML IV BOLUS
INTRAVENOUS | Status: DC | PRN
  Administered 2022-02-07: 170 mg via INTRAVENOUS

## 2022-02-07 MED ORDER — 0.9 % SODIUM CHLORIDE (POUR BTL) OPTIME
TOPICAL | Status: DC | PRN
  Administered 2022-02-07: 1000 mL

## 2022-02-07 MED ORDER — FENTANYL CITRATE (PF) 100 MCG/2ML IJ SOLN
INTRAMUSCULAR | Status: AC
  Filled 2022-02-07: qty 2

## 2022-02-07 MED ORDER — METHOCARBAMOL 750 MG PO TABS: 750.0000 mg | ORAL_TABLET | Freq: Two times a day (BID) | ORAL | 3 refills | Status: DC | PRN

## 2022-02-07 MED ORDER — ACETAMINOPHEN 500 MG PO TABS
1000.0000 mg | ORAL_TABLET | Freq: Once | ORAL | Status: AC
  Administered 2022-02-07: 1000 mg via ORAL
  Filled 2022-02-07: qty 2

## 2022-02-07 MED ORDER — ORAL CARE MOUTH RINSE: 15.0000 mL | Freq: Once | OROMUCOSAL | Status: AC

## 2022-02-07 MED ORDER — LIDOCAINE 2% (20 MG/ML) 5 ML SYRINGE
INTRAMUSCULAR | Status: DC | PRN
Start: 2022-02-07 — End: 2022-02-07
  Administered 2022-02-07: 100 mg via INTRAVENOUS

## 2022-02-07 MED ORDER — OXYCODONE-ACETAMINOPHEN 5-325 MG PO TABS: 1.0000 | ORAL_TABLET | Freq: Three times a day (TID) | ORAL | 0 refills | Status: DC | PRN

## 2022-02-07 MED ORDER — ROCURONIUM BROMIDE 10 MG/ML (PF) SYRINGE
PREFILLED_SYRINGE | INTRAVENOUS | Status: DC | PRN
Start: 2022-02-07 — End: 2022-02-07
  Administered 2022-02-07: 60 mg via INTRAVENOUS

## 2022-02-07 MED ORDER — PROPOFOL 1000 MG/100ML IV EMUL
INTRAVENOUS | Status: AC
  Filled 2022-02-07: qty 100

## 2022-02-07 SURGICAL SUPPLY — 85 items
BLADE SURG 15 STRL LF DISP TIS (BLADE) ×1 IMPLANT
BLADE SURG 15 STRL SS (BLADE) ×1
BNDG COHESIVE 4X5 TAN ST LF (GAUZE/BANDAGES/DRESSINGS) ×2 IMPLANT
BNDG ELASTIC 3X5.8 VLCR STR LF (GAUZE/BANDAGES/DRESSINGS) ×2 IMPLANT
BNDG ELASTIC 4X5.8 VLCR STR LF (GAUZE/BANDAGES/DRESSINGS) ×3 IMPLANT
BNDG ESMARK 4X9 LF (GAUZE/BANDAGES/DRESSINGS) ×2 IMPLANT
CORD BIPOLAR FORCEPS 12FT (ELECTRODE) IMPLANT
COVER BACK TABLE 60X90IN (DRAPES) ×2 IMPLANT
CUFF TOURN SGL QUICK 18X3 (MISCELLANEOUS) IMPLANT
CUFF TOURN SGL QUICK 18X4 (TOURNIQUET CUFF) IMPLANT
CUFF TOURN SGL QUICK 24 (TOURNIQUET CUFF)
CUFF TRNQT CYL 24X4X16.5-23 (TOURNIQUET CUFF) IMPLANT
DRAPE EXTREMITY T 121X128X90 (DISPOSABLE) ×2 IMPLANT
DRAPE IMP U-DRAPE 54X76 (DRAPES) ×4 IMPLANT
DRAPE OEC MINIVIEW 54X84 (DRAPES) ×2 IMPLANT
DRAPE SURG 17X23 STRL (DRAPES) ×2 IMPLANT
DRSG PAD ABDOMINAL 8X10 ST (GAUZE/BANDAGES/DRESSINGS) ×1 IMPLANT
ELECT REM PT RETURN 9FT ADLT (ELECTROSURGICAL) ×2
ELECTRODE REM PT RTRN 9FT ADLT (ELECTROSURGICAL) ×1 IMPLANT
GAUZE SPONGE 4X4 12PLY STRL (GAUZE/BANDAGES/DRESSINGS) ×2 IMPLANT
GAUZE XEROFORM 1X8 LF (GAUZE/BANDAGES/DRESSINGS) ×1 IMPLANT
GLOVE SURG NEOP MICRO LF SZ7.5 (GLOVE) ×2 IMPLANT
GLOVE SURG SYN 7.5  E (GLOVE) ×1
GLOVE SURG SYN 7.5 E (GLOVE) ×1 IMPLANT
GLOVE SURG SYN 7.5 PF PI (GLOVE) ×1 IMPLANT
GLOVE SURG UNDER POLY LF SZ7 (GLOVE) ×2 IMPLANT
GLOVE SURG UNDER POLY LF SZ7.5 (GLOVE) ×2 IMPLANT
GOWN STRL REIN XL XLG (GOWN DISPOSABLE) ×2 IMPLANT
GOWN STRL REUS W/ TWL LRG LVL3 (GOWN DISPOSABLE) ×1 IMPLANT
GOWN STRL REUS W/ TWL XL LVL3 (GOWN DISPOSABLE) ×1 IMPLANT
GOWN STRL REUS W/TWL LRG LVL3 (GOWN DISPOSABLE) ×1
GOWN STRL REUS W/TWL XL LVL3 (GOWN DISPOSABLE) ×1
IMP SYS 2ND FIX PEEK 4.75X19.1 (Miscellaneous) ×2 IMPLANT
IMPL SYS 2ND FX PEEK 4.75X19.1 (Miscellaneous) IMPLANT
LOOP VESSEL MAXI BLUE (MISCELLANEOUS) IMPLANT
NDL HYPO 25X1 1.5 SAFETY (NEEDLE) IMPLANT
NDL SUT 6 .5 CRC .975X.05 MAYO (NEEDLE) IMPLANT
NEEDLE HYPO 25X1 1.5 SAFETY (NEEDLE) IMPLANT
NEEDLE MAYO TAPER (NEEDLE)
NS IRRIG 1000ML POUR BTL (IV SOLUTION) ×2 IMPLANT
PACK BASIN DAY SURGERY FS (CUSTOM PROCEDURE TRAY) ×2 IMPLANT
PAD CAST 3X4 CTTN HI CHSV (CAST SUPPLIES) ×1 IMPLANT
PAD CAST 4YDX4 CTTN HI CHSV (CAST SUPPLIES) ×1 IMPLANT
PADDING CAST ABS 4INX4YD NS (CAST SUPPLIES)
PADDING CAST ABS COTTON 4X4 ST (CAST SUPPLIES) IMPLANT
PADDING CAST COTTON 3X4 STRL (CAST SUPPLIES) ×1
PADDING CAST COTTON 4X4 STRL (CAST SUPPLIES) ×4
PADDING CAST SYNTHETIC 4 (CAST SUPPLIES)
PADDING CAST SYNTHETIC 4X4 STR (CAST SUPPLIES) IMPLANT
PENCIL BUTTON HOLSTER BLD 10FT (ELECTRODE) ×2 IMPLANT
RETRIEVER SUT HEWSON (MISCELLANEOUS) ×1 IMPLANT
SHEET MEDIUM DRAPE 40X70 STRL (DRAPES) ×4 IMPLANT
SLEEVE SCD COMPRESS KNEE MED (STOCKING) ×2 IMPLANT
SLING ARM FOAM STRAP LRG (SOFTGOODS) ×2 IMPLANT
SPIKE FLUID TRANSFER (MISCELLANEOUS) IMPLANT
SPLINT FIBERGLASS 3X35 (CAST SUPPLIES) ×2 IMPLANT
SPLINT FIBERGLASS 4X30 (CAST SUPPLIES) ×2 IMPLANT
SPONGE T-LAP 18X18 ~~LOC~~+RFID (SPONGE) ×3 IMPLANT
STOCKINETTE IMPERVIOUS 9X36 MD (GAUZE/BANDAGES/DRESSINGS) ×1 IMPLANT
STOCKINETTE IMPERVIOUS LG (DRAPES) ×2 IMPLANT
STRIP CLOSURE SKIN 1/2X4 (GAUZE/BANDAGES/DRESSINGS) ×2 IMPLANT
STRIP CLOSURE SKIN 1/4X4 (GAUZE/BANDAGES/DRESSINGS) IMPLANT
SUCTION FRAZIER HANDLE 10FR (MISCELLANEOUS) ×1
SUCTION TUBE FRAZIER 10FR DISP (MISCELLANEOUS) ×1 IMPLANT
SUT ETHIBOND 2 OS 4 DA (SUTURE) IMPLANT
SUT ETHILON 3 0 FSL (SUTURE) ×3 IMPLANT
SUT FIBERWIRE #2 38 T-5 BLUE (SUTURE)
SUT MNCRL AB 4-0 PS2 18 (SUTURE) ×2 IMPLANT
SUT TIGER TAPE 7 IN WHITE (SUTURE) ×2 IMPLANT
SUT VIC AB 2-0 CT1 27 (SUTURE) ×3
SUT VIC AB 2-0 CT1 TAPERPNT 27 (SUTURE) IMPLANT
SUT VIC AB 2-0 SH 27 (SUTURE) ×1
SUT VIC AB 2-0 SH 27XBRD (SUTURE) ×1 IMPLANT
SUT VICRYL 0 SH 27 (SUTURE) ×2 IMPLANT
SUTURE FIBERWR #2 38 T-5 BLUE (SUTURE) IMPLANT
SUTURE TAPE 1.3 40 TPR END (SUTURE) IMPLANT
SUTURE TAPE TIGERLINK 1.3MM BL (SUTURE) IMPLANT
SUTURETAPE 1.3 40 TPR END (SUTURE) ×4
SUTURETAPE TIGERLINK 1.3MM BL (SUTURE)
SYR BULB EAR ULCER 3OZ GRN STR (SYRINGE) ×2 IMPLANT
SYR CONTROL 10ML LL (SYRINGE) IMPLANT
TOWEL GREEN STERILE FF (TOWEL DISPOSABLE) ×4 IMPLANT
TUBE CONNECTING 20X1/4 (TUBING) ×2 IMPLANT
UNDERPAD 30X36 HEAVY ABSORB (UNDERPADS AND DIAPERS) ×2 IMPLANT
YANKAUER SUCT BULB TIP NO VENT (SUCTIONS) ×2 IMPLANT

## 2022-02-07 NOTE — Anesthesia Procedure Notes (Signed)
Anesthesia Regional Block: Supraclavicular block   Pre-Anesthetic Checklist: , timeout performed,  Correct Patient, Correct Site, Correct Laterality,  Correct Procedure, Correct Position, site marked,  Risks and benefits discussed,  Surgical consent,  Pre-op evaluation,  At surgeon's request and post-op pain management  Laterality: Left  Prep: chloraprep       Needles:  Injection technique: Single-shot  Needle Type: Echogenic Needle     Needle Length: 9cm  Needle Gauge: 21     Additional Needles:   Procedures:,,,, ultrasound used (permanent image in chart),,    Narrative:  Start time: 02/07/2022 1:20 PM End time: 02/07/2022 1:28 PM Injection made incrementally with aspirations every 5 mL.  Performed by: Personally  Anesthesiologist: Santa Lighter, MD  Additional Notes: No pain on injection. No increased resistance to injection. Injection made in 5cc increments.  Good needle visualization.  Patient tolerated procedure well.

## 2022-02-07 NOTE — Discharge Instructions (Signed)
° °  Postoperative instructions:  Weightbearing instructions: non weight bearing  Dressing instructions: Keep your dressing and/or splint clean and dry at all times.  It will be removed at your first post-operative appointment.  Your stitches and/or staples will be removed at this visit.  Incision instructions:  Do not soak your incision for 3 weeks after surgery.  If the incision gets wet, pat dry and do not scrub the incision.  Pain control:  You have been given a prescription to be taken as directed for post-operative pain control.  In addition, elevate the operative extremity above the heart at all times to prevent swelling and throbbing pain.  Take over-the-counter Colace, 100mg by mouth twice a day while taking narcotic pain medications to help prevent constipation.  Follow up appointments: 1) 7 days for wound check. 2) Dr. Wynetta Seith as scheduled.   -------------------------------------------------------------------------------------------------------------  After Surgery Pain Control:  After your surgery, post-surgical discomfort or pain is likely. This discomfort can last several days to a few weeks. At certain times of the day your discomfort may be more intense.  Did you receive a nerve block?  A nerve block can provide pain relief for one hour to two days after your surgery. As long as the nerve block is working, you will experience little or no sensation in the area the surgeon operated on.  As the nerve block wears off, you will begin to experience pain or discomfort. It is very important that you begin taking your prescribed pain medication before the nerve block fully wears off. Treating your pain at the first sign of the block wearing off will ensure your pain is better controlled and more tolerable when full-sensation returns. Do not wait until the pain is intolerable, as the medicine will be less effective. It is better to treat pain in advance than to try and catch up.  General  Anesthesia:  If you did not receive a nerve block during your surgery, you will need to start taking your pain medication shortly after your surgery and should continue to do so as prescribed by your surgeon.  Pain Medication:  Most commonly we prescribe Vicodin and Percocet for post-operative pain. Both of these medications contain a combination of acetaminophen (Tylenol) and a narcotic to help control pain.   It takes between 30 and 45 minutes before pain medication starts to work. It is important to take your medication before your pain level gets too intense.   Nausea is a common side effect of many pain medications. You will want to eat something before taking your pain medicine to help prevent nausea.   If you are taking a prescription pain medication that contains acetaminophen, we recommend that you do not take additional over the counter acetaminophen (Tylenol).  Other pain relieving options:   Using a cold pack to ice the affected area a few times a day (15 to 20 minutes at a time) can help to relieve pain, reduce swelling and bruising.   Elevation of the affected area can also help to reduce pain and swelling.  

## 2022-02-07 NOTE — Anesthesia Procedure Notes (Signed)
Procedure Name: Intubation Date/Time: 02/07/2022 3:36 PM Performed by: Reeves Dam, CRNA Pre-anesthesia Checklist: Patient identified, Patient being monitored, Timeout performed, Emergency Drugs available and Suction available Patient Re-evaluated:Patient Re-evaluated prior to induction Oxygen Delivery Method: Circle system utilized Preoxygenation: Pre-oxygenation with 100% oxygen Induction Type: IV induction Ventilation: Mask ventilation without difficulty Laryngoscope Size: 3 and Glidescope Grade View: Grade I Tube type: Oral Tube size: 7.5 mm Number of attempts: 1 Airway Equipment and Method: Patient positioned with wedge pillow, Video-laryngoscopy and Rigid stylet Placement Confirmation: ETT inserted through vocal cords under direct vision, positive ETCO2 and breath sounds checked- equal and bilateral Secured at: 22 cm Tube secured with: Tape Dental Injury: Teeth and Oropharynx as per pre-operative assessment

## 2022-02-07 NOTE — H&P (Signed)
PREOPERATIVE H&P  Chief Complaint: left triceps rupture  HPI: Kathleen Le is a 61 y.o. female who presents for surgical treatment of left triceps rupture.  She denies any changes in medical history.  Past Medical History:  Diagnosis Date   Anxiety    Arthritis    Asthma    Cancer ([place])    COPD (chronic obstructive pulmonary disease) ([place])    Degenerative disc disease, lumbar    Diabetes mellitus without complication ([facility])    Dyspnea    with exertion due to COPD   GERD (gastroesophageal reflux disease)    History of alcohol abuse    20-30 years ago, patient was going through divorce but does not currently drink any alcohol   History of marijuana use    "its been a long time since I last smoked because of my COPD"   History of ovarian cancer 1984   Hypertension    Pneumonia    Substance abuse ([place])    patient denies, states she has only ever used marijuana   Past Surgical History:  Procedure Laterality Date   DILATION AND CURETTAGE OF UTERUS     ECTOPIC PREGNANCY SURGERY     patient stated she had to have tube removed   Social History   Socioeconomic History   Marital status: Married    Spouse name: Not on file   Number of children: Not on file   Years of education: Not on file   Highest education level: Not on file  Occupational History   Not on file  Tobacco Use   Smoking status: Every Day    Packs/day: 0.50    Types: Cigarettes   Smokeless tobacco: Never  Vaping Use   Vaping Use: Never used  Substance and Sexual Activity   Alcohol use: Not Currently   Drug use: Not Currently    Types: Marijuana    Comment: no longer smokes marijuana due to COPD   Sexual activity: Yes  Other Topics Concern   Not on file  Social History Narrative   Not on file   Social Determinants of Health   Financial Resource Strain: Not on file  Food Insecurity: Not on file  Transportation Needs: Not on file  Physical Activity: Not on file  Stress: Not on file  Social  Connections: Not on file   Family History  Problem Relation Age of Onset   Cancer Paternal Grandmother    Diabetes Paternal Grandmother    Diabetes Paternal Grandfather    Heart attack Maternal Aunt    Cancer Maternal Uncle    Cancer Maternal Grandmother    Heart attack Mother    Cancer Mother    Stroke Mother    Diabetes Father    Heart attack Father    Diabetes Paternal Uncle    Diabetes Paternal Uncle    Allergies  Allergen Reactions   Gabapentin Nausea And Vomiting    Nauseated/feeling sick   Invokana [Canagliflozin]     Vaginitis    Lyrica [Pregabalin] Other (See Comments)    Pain in feet increased and spasm   Prior to Admission medications   Medication Sig Start Date End Date Taking? Authorizing Provider  albuterol (PROVENTIL) (2.5 MG/3ML) 0.083% nebulizer solution USE 1 VIAL IN NEBULIZER EVERY 4 HOURS AS NEEDED FOR WHEEZING FOR SHORTNESS OF BREATH 02/03/22  Yes Breeback, [name] L, PA-C  albuterol (VENTOLIN HFA) 108 (90 Base) MCG/ACT inhaler INHALE 2 PUFFS INTO LUNGS EVERY 6 HOURS AS NEEDED 01/10/22  Yes  Breeback, [name] L, PA-C  amLODipine (NORVASC) 5 MG tablet Take 1 tablet by mouth once daily 12/16/21  Yes Breeback, [name] L, PA-C  aspirin 81 MG tablet Take 81 mg by mouth daily.   Yes [provider]  atenolol (TENORMIN) 50 MG tablet Take 1 tablet by mouth once daily 08/22/21  Yes Breeback, [name] L, PA-C  budesonide-formoterol (SYMBICORT) 160-4.5 MCG/ACT inhaler Inhale 2 puffs into the lungs 2 (two) times daily. 11/15/21  Yes Breeback, [name] L, PA-C  Calcium Carbonate (CALCARB 600 PO) Take 600 mg by mouth daily.   Yes [provider]  doxepin (SINEQUAN) 25 MG capsule TAKE 2 CAPSULES BY MOUTH AT BEDTIME AS NEEDED 12/25/21  Yes Breeback, [name] L, PA-C  DULoxetine (CYMBALTA) 30 MG capsule Take 1 capsule by mouth twice daily 09/19/21  Yes Breeback, [name] L, PA-C  esomeprazole (NEXIUM) 20 MG capsule Take 20 mg by mouth daily.   Yes [provider]  Flaxseed,  Linseed, (FLAX SEED OIL) 1000 MG CAPS Take 1,000 mg by mouth daily.   Yes [provider]  glipiZIDE (GLUCOTROL) 10 MG tablet TAKE 1 TABLET BY MOUTH TWICE DAILY BEFORE A MEAL 11/18/21  Yes Breeback, [name] L, PA-C  hydrochlorothiazide (HYDRODIURIL) 25 MG tablet Take 1 tablet by mouth once daily 12/16/21  Yes Breeback, [name] L, PA-C  insulin NPH Human (NOVOLIN N RELION) 100 UNIT/ML injection Inject 0.4 mLs (40 Units total) into the skin at bedtime. Patient taking differently: Inject 20 Units into the skin at bedtime. 01/30/22  Yes Breeback, [name] L, PA-C  lisinopril (ZESTRIL) 5 MG tablet Take 1 tablet by mouth once daily 08/29/21  Yes Breeback, [name] L, PA-C  magnesium gluconate (MAGONATE) 500 MG tablet Take 500 mg by mouth daily.   Yes [provider]  meloxicam (MOBIC) 15 MG tablet Take 15 mg by mouth daily.   Yes [provider]  metFORMIN (GLUCOPHAGE) 1000 MG tablet TAKE 1 TABLET BY MOUTH TWICE DAILY WITH MEALS 01/06/22  Yes Breeback, [name] L, PA-C  Multiple Vitamins-Minerals (MULTIVITAMIN WITH MINERALS) tablet Take 1 tablet by mouth daily.   Yes [provider]  nicotine (NICODERM CQ) 21 mg/24hr patch Place 1 patch (21 mg total) onto the skin daily. 11/15/21  Yes Breeback, [name] L, PA-C  nystatin (MYCOSTATIN) 100000 UNIT/ML suspension Take 5 mLs (500,000 Units total) by mouth 4 (four) times daily. Patient taking differently: Take 5 mLs by mouth 4 (four) times daily as needed (thrush). 12/06/21  Yes Breeback, [name] L, PA-C  Omega-3 Fatty Acids (FISH OIL) 1000 MG CAPS Take 1,000 mg by mouth 2 (two) times daily.   Yes [provider]  OVER THE COUNTER MEDICATION Apply 1 application topically at bedtime. CBD Cream   Yes [provider]  pioglitazone (ACTOS) 30 MG tablet Take 1 tablet (30 mg total) by mouth daily. 11/21/19  Yes Breeback, [name] L, PA-C  Probiotic Product (PROBIOTIC DAILY PO) Take 1 capsule by mouth daily.   Yes [provider]   rosuvastatin (CRESTOR) 20 MG tablet Take 1 tablet by mouth once daily 11/20/21  Yes Breeback, [name] L, PA-C  sitaGLIPtin (JANUVIA) 100 MG tablet Take 1 tablet (100 mg total) by mouth daily. 02/13/21  Yes Breeback, [name] L, PA-C  AMBULATORY NON FORMULARY MEDICATION Blood sugar testing strips and lancets for [employer] glucometer.  Use to check blood sugar up to two times a day.  Dx: Type 2 Diabetes 01/04/16   Breeback, Royetta Car, PA-C  AMBULATORY NON FORMULARY MEDICATION Please provide needles  for [place] pens 01/08/16   Breeback, [name] L, PA-C  glucose blood test strip Use up to 4 times per day as directed with glucometer. Disp: 100. Refill x99 please dispense brand per insurance coverage/patient preference 08/20/21   Iran Planas L, PA-C  glucose blood test strip Use as instructed 01/29/22   Breeback, [name] L, PA-C  ibuprofen (ADVIL) 600 MG tablet Take 1 tablet (600 mg total) by mouth every 6 (six) hours as needed. Patient not taking: Reported on 01/30/2022 12/25/21   Melynda Ripple, MD  Insulin Syringe-Needle U-100 (INSULIN SYRINGE .3CC/31GX5/16") 31G X 5/16" 0.3 ML MISC Dx DM E11.9. Inject insulin daily at bedtime. 06/30/21   Breeback, [name] L, PA-C  Lancets 30G MISC Check blood sugar up to 4 times a day. 08/20/21   Breeback, [name] L, PA-C  tiZANidine (ZANAFLEX) 4 MG tablet Take 1 tablet (4 mg total) by mouth every 8 (eight) hours as needed for muscle spasms. Patient not taking: Reported on 01/30/2022 12/25/21   Melynda Ripple, MD     Positive ROS: All other systems have been reviewed and were otherwise negative with the exception of those mentioned in the HPI and as above.  Physical Exam: General: Alert, no acute distress Cardiovascular: No pedal edema Respiratory: No cyanosis, no use of accessory musculature GI: abdomen soft Skin: No lesions in the area of chief complaint Neurologic: Sensation intact distally Psychiatric: Patient is competent for consent with normal mood and affect Lymphatic: no  lymphedema  MUSCULOSKELETAL: exam stable  Assessment: left triceps rupture  Plan: Plan for Procedure(s): left triceps repair  The risks benefits and alternatives were discussed with the patient including but not limited to the risks of nonoperative treatment, versus surgical intervention including infection, bleeding, nerve injury,  blood clots, cardiopulmonary complications, morbidity, mortality, among others, and they were willing to proceed.   Preoperative templating of the joint replacement has been completed, documented, and submitted to the Operating Room personnel in order to optimize intra-operative equipment management.   Eduard Roux, MD 02/07/2022 1:45 PM

## 2022-02-07 NOTE — Transfer of Care (Signed)
Immediate Anesthesia Transfer of Care Note  Patient: Kathleen Le  Procedure(s) Performed: left triceps repair (Left: Arm Upper)  Patient Location: PACU  Anesthesia Type:GA combined with regional for post-op pain  Level of Consciousness: drowsy and patient cooperative  Airway & Oxygen Therapy: Patient Spontanous Breathing and Patient connected to nasal cannula oxygen  Post-op Assessment: Report given to RN, Post -op Vital signs reviewed and stable and Patient moving all extremities  Post vital signs: Reviewed and stable  Last Vitals:  Vitals Value Taken Time  BP 133/56 02/07/22 1659  Temp    Pulse 76 02/07/22 1659  Resp 17 02/07/22 1659  SpO2 92 % 02/07/22 1659  Vitals shown include unvalidated device data.  Last Pain:  Vitals:   02/07/22 1347  TempSrc:   PainSc: 0-No pain      Patients Stated Pain Goal: 3 (10/93/23 5573)  Complications: No notable events documented.

## 2022-02-08 NOTE — Anesthesia Postprocedure Evaluation (Signed)
Anesthesia Post Note  Patient: Kathleen Le  Procedure(s) Performed: left triceps repair (Left: Arm Upper)     Patient location during evaluation: PACU Anesthesia Type: General Level of consciousness: awake and alert Pain management: pain level controlled Vital Signs Assessment: post-procedure vital signs reviewed and stable Respiratory status: spontaneous breathing, nonlabored ventilation, respiratory function stable and patient connected to nasal cannula oxygen Cardiovascular status: blood pressure returned to baseline and stable Postop Assessment: no apparent nausea or vomiting Anesthetic complications: no   No notable events documented.  Last Vitals:  Vitals:   02/07/22 1744 02/07/22 1750  BP: 126/82 (!) 115/58  Pulse: 67 77  Resp: 16 17  Temp:  36.6 C  SpO2: 93% 93%    Last Pain:  Vitals:   02/07/22 1750  TempSrc:   PainSc: 0-No pain                 [place] Sheriff S

## 2022-02-10 ENCOUNTER — Encounter (HOSPITAL_COMMUNITY): Payer: Self-pay | Admitting: Orthopaedic Surgery

## 2022-02-10 NOTE — Op Note (Signed)
° °  Date of Surgery: 02/10/2022  INDICATIONS: Ms. Gilkison is a 61 y.o.-year-old female with a left triceps rupture that she suffered 6 weeks ago from a mechanical fall.  The patient did consent to the procedure after discussion of the risks and benefits.  PREOPERATIVE DIAGNOSIS: Chronic left triceps rupture  POSTOPERATIVE DIAGNOSIS: Same.  PROCEDURE:  Tenolysis of left triceps tendon Repair of left triceps tendon without graft  SURGEON: N. Eduard Roux, M.D.  ASSIST: Ciro Backer [place], [place]; necessary for the timely completion of procedure and due to complexity of procedure.  ANESTHESIA:  general, peripheral block  IV FLUIDS AND URINE: See anesthesia.  ESTIMATED BLOOD LOSS: minimal mL.  IMPLANTS:  Implant Name Type Inv. Item Serial No. Manufacturer Lot No. LRB No. Used Action  IMP SYS 2ND FIX PEEK 4.75X19.1 - [id number] Miscellaneous IMP SYS 2ND FIX PEEK 4.75X19.1  [employer] [id number] Left 1 Implanted    DRAINS: none  COMPLICATIONS: see description of procedure.  DESCRIPTION OF PROCEDURE: The patient was brought to the operating room.  The patient had been signed prior to the procedure and this was documented. The patient had the anesthesia placed by the anesthesiologist.  A time-out was performed to confirm that this was the correct patient, site, side and location. The patient did receive antibiotics prior to the incision and was re-dosed during the procedure as needed at indicated intervals.  A tourniquet was placed.  The patient had the operative extremity prepped and draped in the standard surgical fashion.    A posterior curvilinear incision was created over the elbow.  Full thickness flaps were raised.  Small traumatic seroma was encountered and evacuated.  The rupture was identified.  Tenolysis of the triceps tendon was performed for mobilization of the tendon.  There was about 2 cm of retraction of the tendon from the insertion site.  The bone was prepared with a rongeur  down to bleeding bone.  Two fibertape sutures were placed in the tendon in a running locking [place] fashion to produce 4 parallel strands.  These sutures were delivered through 2 bone tunnels that were drilled through the olecranon using a 2.0 mm drill bit.  Fluoro was used to confirm appropriate tunnel placement.  The sutures were then criss crossed and delivered again using a fiberloop to maximize approximation of the triceps tendon to the bony surface.  With the tendon down to the bone, the sutures were anchored into the proximal ulnar shaft with a 4.75 mm swivel lock anchor.  The fixation was excellent.  Range of motion of the elbow showed no gapping of the repair site.  Surgical site was thoroughly irrigated and closed in a layered fashion.  Sterile dressings applied and posterior long arm splint placed at 60 degrees.  Patient tolerated the procedure well.    [name] was necessary for opening, closing, retracting, limb positioning and overall facilitation and timely completion of the procedure.  POSTOPERATIVE PLAN: She will be discharged home and follow up in 1 week for suture removal and initiation of range of motion.  Azucena Cecil, MD 8:50 AM

## 2022-02-10 NOTE — Anesthesia Postprocedure Evaluation (Deleted)
Anesthesia Post Note  Patient: Kathleen Le  Procedure(s) Performed: left triceps repair (Left: Arm Upper)     Patient location during evaluation: PACU Anesthesia Type: General Level of consciousness: awake and alert Pain management: pain level controlled Vital Signs Assessment: post-procedure vital signs reviewed and stable Respiratory status: spontaneous breathing, nonlabored ventilation, respiratory function stable and patient connected to nasal cannula oxygen Cardiovascular status: blood pressure returned to baseline and stable Postop Assessment: no apparent nausea or vomiting Anesthetic complications: no   No notable events documented.  Last Vitals:  Vitals:   02/07/22 1744 02/07/22 1750  BP: 126/82 (!) 115/58  Pulse: 67 77  Resp: 16 17  Temp:  36.6 C  SpO2: 93% 93%    Last Pain:  Vitals:   02/07/22 1750  TempSrc:   PainSc: 0-No pain                 Santa Lighter

## 2022-02-11 ENCOUNTER — Ambulatory Visit (INDEPENDENT_AMBULATORY_CARE_PROVIDER_SITE_OTHER): Payer: 59 | Admitting: Physician Assistant

## 2022-02-11 ENCOUNTER — Other Ambulatory Visit: Payer: Self-pay

## 2022-02-11 ENCOUNTER — Encounter: Payer: Self-pay | Admitting: Orthopaedic Surgery

## 2022-02-11 DIAGNOSIS — S46312A Strain of muscle, fascia and tendon of triceps, left arm, initial encounter: Secondary | ICD-10-CM

## 2022-02-11 MED ORDER — HYDROCODONE-ACETAMINOPHEN 5-325 MG PO TABS: 1.0000 | ORAL_TABLET | Freq: Three times a day (TID) | ORAL | 0 refills | Status: DC | PRN

## 2022-02-11 NOTE — Progress Notes (Signed)
Post-Op Visit Note   Patient: Kathleen Le           Date of Birth: 1961-09-20           MRN: [id number] Visit Date: 02/11/2022 PCP: Donella Stade, PA-C   Assessment & Plan:  Chief Complaint:  Chief Complaint  Patient presents with   Left Elbow - Follow-up, Routine Post Op    Left triceps repair 02/07/22   Visit Diagnoses:  1. Triceps tendon rupture, left, initial encounter     Plan: Patient is a pleasant 61 year old female who comes in today 4 days status post left triceps repair 02/07/2022.  She notes that the splint was cutting into her upper arm causing it to bruise.  She also thinks that she was getting irritation that was causing hives to her arm.  She had her husband cut the splint off last night.  Examination of her left arm reveals moderate ecchymosis to the proximal posterior aspect.  No evidence of cvuts or abrasions.  Fingers are warm well perfused.  She is neurovascular intact distally.  Today, new long-arm splint was applied.  She will follow-up with [place] Friday for repeat evaluation.  Call with concerns or questions meantime.  Follow-Up Instructions: Return in about 3 days (around 02/14/2022).   Orders:  No orders of the defined types were placed in this encounter.  Meds ordered this encounter  Medications   HYDROcodone-acetaminophen (NORCO) 5-325 MG tablet    Sig: Take 1-2 tablets by mouth 3 (three) times daily as needed.    Dispense:  30 tablet    Refill:  0    Imaging: No new imaging  PMFS History: Patient Active Problem List   Diagnosis Date Noted   Triceps tendon rupture, left, initial encounter 02/07/2022   Fall 12/31/2021   Contusion of left arm 12/31/2021   Acute left-sided low back pain without sciatica 12/31/2021   Neck pain 12/31/2021   Encounter for smoking cessation counseling 11/15/2021   Chronic obstructive pulmonary disease ([place]) 02/26/2021   Hyperlipidemia associated with type 2 diabetes mellitus ([place]) 11/05/2020   Non-restorative  sleep 11/05/2020   Snoring 11/05/2020   Anterolisthesis 07/30/2020   Acute stress reaction 09/12/2018   Current smoker 06/08/2018   Diabetes mellitus, type II, insulin dependent ([place]) 06/08/2018   Claustrophobia 06/08/2018   Primary insomnia 06/08/2018   Hot flashes 06/08/2018   Abnormal weight gain 06/08/2018   Lower leg edema 11/10/2017   Insomnia due to other mental disorder 10/27/2017   History of substance abuse ([place]) 10/27/2017   GAD (generalized anxiety disorder) 10/27/2017   Class 3 severe obesity due to excess calories with serious comorbidity and body mass index (BMI) of 40.0 to 44.9 in adult ([place]) 08/31/2017   Cerumen impaction 01/24/2015   Hyperlipidemia 01/24/2015   Gastroesophageal reflux disease with esophagitis 01/10/2015   Foot callus 10/03/2014   PTSD (post-traumatic stress disorder) 09/26/2014   Depression 05/19/2014   Anxiety 05/19/2014   Chronic bilateral low back pain with right-sided sciatica 05/19/2014   DDD (degenerative disc disease), lumbar 05/19/2014   Neuropathy 02/05/2014   IT band syndrome 02/05/2014   Sinus tachycardia 02/05/2014   Memory changes 02/05/2014   Word finding difficulty 02/05/2014   Cervical cancer ([place]) 02/05/2014   Type II diabetes mellitus, uncontrolled 02/03/2014   Unspecified asthma(493.90) 02/03/2014   Essential hypertension, benign 02/03/2014   Pulmonary emphysema ([place]) 07/26/2012   Past Medical History:  Diagnosis Date   Anxiety    Arthritis    Asthma  Cancer (HCC)    COPD (chronic obstructive pulmonary disease) (HCC)    Degenerative disc disease, lumbar    Diabetes mellitus without complication (HCC)    Dyspnea    with exertion due to COPD   GERD (gastroesophageal reflux disease)    History of alcohol abuse    20-30 years ago, patient was going through divorce but does not currently drink any alcohol   History of marijuana use    "its been a long time since I last smoked because of my COPD"   History of ovarian  cancer 1984   Hypertension    Pneumonia    Substance abuse ([place])    patient denies, states she has only ever used marijuana    Family History  Problem Relation Age of Onset   Cancer Paternal Grandmother    Diabetes Paternal Grandmother    Diabetes Paternal Grandfather    Heart attack Maternal Aunt    Cancer Maternal Uncle    Cancer Maternal Grandmother    Heart attack Mother    Cancer Mother    Stroke Mother    Diabetes Father    Heart attack Father    Diabetes Paternal Uncle    Diabetes Paternal Uncle     Past Surgical History:  Procedure Laterality Date   DILATION AND CURETTAGE OF UTERUS     ECTOPIC PREGNANCY SURGERY     patient stated she had to have tube removed   TRICEPS TENDON REPAIR Left 02/07/2022   Procedure: left triceps repair;  Surgeon: Leandrew Koyanagi, MD;  Location: [facility];  Service: Orthopedics;  Laterality: Left;   Social History   Occupational History   Not on file  Tobacco Use   Smoking status: Every Day    Packs/day: 0.50    Types: Cigarettes   Smokeless tobacco: Never  Vaping Use   Vaping Use: Never used  Substance and Sexual Activity   Alcohol use: Not Currently   Drug use: Not Currently    Types: Marijuana    Comment: no longer smokes marijuana due to COPD   Sexual activity: Yes

## 2022-02-12 ENCOUNTER — Other Ambulatory Visit: Payer: Self-pay | Admitting: Physician Assistant

## 2022-02-12 DIAGNOSIS — I10 Essential (primary) hypertension: Secondary | ICD-10-CM

## 2022-02-13 ENCOUNTER — Encounter: Payer: Self-pay | Admitting: Physician Assistant

## 2022-02-13 ENCOUNTER — Other Ambulatory Visit: Payer: Self-pay | Admitting: Physician Assistant

## 2022-02-13 MED ORDER — ALBUTEROL SULFATE HFA 108 (90 BASE) MCG/ACT IN AERS
1.0000 | INHALATION_SPRAY | Freq: Four times a day (QID) | RESPIRATORY_TRACT | 2 refills | Status: DC | PRN
Start: 2022-02-13 — End: 2022-05-30

## 2022-02-14 ENCOUNTER — Other Ambulatory Visit: Payer: Self-pay

## 2022-02-14 ENCOUNTER — Encounter: Payer: Self-pay | Admitting: Orthopaedic Surgery

## 2022-02-14 ENCOUNTER — Encounter: Payer: 59 | Admitting: Orthopaedic Surgery

## 2022-02-14 ENCOUNTER — Ambulatory Visit (INDEPENDENT_AMBULATORY_CARE_PROVIDER_SITE_OTHER): Payer: 59 | Admitting: Physician Assistant

## 2022-02-14 DIAGNOSIS — S46312A Strain of muscle, fascia and tendon of triceps, left arm, initial encounter: Secondary | ICD-10-CM

## 2022-02-14 MED ORDER — FLUCONAZOLE 150 MG PO TABS: 150.0000 mg | ORAL_TABLET | Freq: Once | ORAL | 0 refills | Status: DC

## 2022-02-14 NOTE — Progress Notes (Signed)
Post-Op Visit Note   Patient: Kathleen Le           Date of Birth: 04/07/1961           MRN: [id number] Visit Date: 02/14/2022 PCP: Donella Stade, PA-C   Assessment & Plan:  Chief Complaint:  Chief Complaint  Patient presents with   Left Arm - Routine Post Op   Visit Diagnoses:  1. Triceps tendon rupture, left, initial encounter     Plan: Patient is a pleasant 61 year old female who comes in today 1 week status post left triceps repair 02/07/2022.  She has been doing well.  She has minimal pain.  She has been compliant wearing her long-arm cast.  Examination of her left arm reveals a well-healing surgical incision with nylon sutures in place.  No evidence of infection or cellulitis.  She is neurovascular intact distally.  Today, the incision was cleaned and recovered.  We will place her in a sling.  We will also start her in therapy where she will work on gentle range of motion.  Referral has been made.  No lifting with the left upper extremity.  She will follow-up with [place] next week for suture removal.  Call with concerns or questions in meantime.  Follow-Up Instructions: Return in about 1 week (around 02/21/2022).   Orders:  Orders Placed This Encounter  Procedures   Ambulatory referral to Physical Therapy   No orders of the defined types were placed in this encounter.   Imaging: No new imaging  PMFS History: Patient Active Problem List   Diagnosis Date Noted   Triceps tendon rupture, left, initial encounter 02/07/2022   Fall 12/31/2021   Contusion of left arm 12/31/2021   Acute left-sided low back pain without sciatica 12/31/2021   Neck pain 12/31/2021   Encounter for smoking cessation counseling 11/15/2021   Chronic obstructive pulmonary disease ([place]) 02/26/2021   Hyperlipidemia associated with type 2 diabetes mellitus ([place]) 11/05/2020   Non-restorative sleep 11/05/2020   Snoring 11/05/2020   Anterolisthesis 07/30/2020   Acute stress reaction 09/12/2018    Current smoker 06/08/2018   Diabetes mellitus, type II, insulin dependent ([place]) 06/08/2018   Claustrophobia 06/08/2018   Primary insomnia 06/08/2018   Hot flashes 06/08/2018   Abnormal weight gain 06/08/2018   Lower leg edema 11/10/2017   Insomnia due to other mental disorder 10/27/2017   History of substance abuse ([place]) 10/27/2017   GAD (generalized anxiety disorder) 10/27/2017   Class 3 severe obesity due to excess calories with serious comorbidity and body mass index (BMI) of 40.0 to 44.9 in adult ([place]) 08/31/2017   Cerumen impaction 01/24/2015   Hyperlipidemia 01/24/2015   Gastroesophageal reflux disease with esophagitis 01/10/2015   Foot callus 10/03/2014   PTSD (post-traumatic stress disorder) 09/26/2014   Depression 05/19/2014   Anxiety 05/19/2014   Chronic bilateral low back pain with right-sided sciatica 05/19/2014   DDD (degenerative disc disease), lumbar 05/19/2014   Neuropathy 02/05/2014   IT band syndrome 02/05/2014   Sinus tachycardia 02/05/2014   Memory changes 02/05/2014   Word finding difficulty 02/05/2014   Cervical cancer ([place]) 02/05/2014   Type II diabetes mellitus, uncontrolled 02/03/2014   Unspecified asthma(493.90) 02/03/2014   Essential hypertension, benign 02/03/2014   Pulmonary emphysema ([place]) 07/26/2012   Past Medical History:  Diagnosis Date   Anxiety    Arthritis    Asthma    Cancer ([place])    COPD (chronic obstructive pulmonary disease) ([place])    Degenerative disc disease, lumbar  Diabetes mellitus without complication (HCC)    Dyspnea    with exertion due to COPD   GERD (gastroesophageal reflux disease)    History of alcohol abuse    20-30 years ago, patient was going through divorce but does not currently drink any alcohol   History of marijuana use    "its been a long time since I last smoked because of my COPD"   History of ovarian cancer 1984   Hypertension    Pneumonia    Substance abuse ([place])    patient denies, states she has only  ever used marijuana    Family History  Problem Relation Age of Onset   Cancer Paternal Grandmother    Diabetes Paternal Grandmother    Diabetes Paternal Grandfather    Heart attack Maternal Aunt    Cancer Maternal Uncle    Cancer Maternal Grandmother    Heart attack Mother    Cancer Mother    Stroke Mother    Diabetes Father    Heart attack Father    Diabetes Paternal Uncle    Diabetes Paternal Uncle     Past Surgical History:  Procedure Laterality Date   DILATION AND CURETTAGE OF UTERUS     ECTOPIC PREGNANCY SURGERY     patient stated she had to have tube removed   TRICEPS TENDON REPAIR Left 02/07/2022   Procedure: left triceps repair;  Surgeon: Leandrew Koyanagi, MD;  Location: [facility];  Service: Orthopedics;  Laterality: Left;   Social History   Occupational History   Not on file  Tobacco Use   Smoking status: Every Day    Packs/day: 0.50    Types: Cigarettes   Smokeless tobacco: Never  Vaping Use   Vaping Use: Never used  Substance and Sexual Activity   Alcohol use: Not Currently   Drug use: Not Currently    Types: Marijuana    Comment: no longer smokes marijuana due to COPD   Sexual activity: Yes

## 2022-02-17 ENCOUNTER — Ambulatory Visit: Payer: PRIVATE HEALTH INSURANCE | Admitting: Physician Assistant

## 2022-02-18 ENCOUNTER — Ambulatory Visit: Payer: 59 | Admitting: Rehabilitative and Restorative Service Providers"

## 2022-02-19 ENCOUNTER — Encounter: Payer: 59 | Admitting: Orthopaedic Surgery

## 2022-02-21 ENCOUNTER — Encounter: Payer: Self-pay | Admitting: Physician Assistant

## 2022-02-21 ENCOUNTER — Other Ambulatory Visit: Payer: Self-pay

## 2022-02-21 ENCOUNTER — Ambulatory Visit (INDEPENDENT_AMBULATORY_CARE_PROVIDER_SITE_OTHER): Payer: 59 | Admitting: Physician Assistant

## 2022-02-21 DIAGNOSIS — S46312A Strain of muscle, fascia and tendon of triceps, left arm, initial encounter: Secondary | ICD-10-CM

## 2022-02-21 NOTE — Progress Notes (Signed)
Post-Op Visit Note   Patient: Kathleen Le           Date of Birth: 01/12/1961           MRN: [id number] Visit Date: 02/21/2022 PCP: Donella Stade, PA-C   Assessment & Plan:  Chief Complaint:  Chief Complaint  Patient presents with   Left Elbow - Routine Post Op   Visit Diagnoses:  1. Triceps tendon rupture, left, initial encounter     Plan: Patient is a pleasant 61 year old female who comes in today 2 weeks status post left triceps repair 02/07/2022.  She has been doing well.  She has been compliant with her sling.  She has not been to physical therapy yet due to insurance issues but is planning on calling insurance today.  Examination of her left arm reveals a well-healed surgical incision with nylon sutures in place.  No evidence of infection or cellulitis.  Fingers warm well perfused.  At this point, sutures were removed and Steri-Strips applied.  She will continue wearing her sling.  No lifting to the left upper extremity.  Continue with physical therapy for gentle range of motion once approved by insurance.  Follow-up with [place] in 4 weeks time for recheck.  Call with concerns or questions.  Follow-Up Instructions: Return in about 4 weeks (around 03/21/2022).   Orders:  No orders of the defined types were placed in this encounter.  No orders of the defined types were placed in this encounter.   Imaging: No new imaging  PMFS History: Patient Active Problem List   Diagnosis Date Noted   Triceps tendon rupture, left, initial encounter 02/07/2022   Fall 12/31/2021   Contusion of left arm 12/31/2021   Acute left-sided low back pain without sciatica 12/31/2021   Neck pain 12/31/2021   Encounter for smoking cessation counseling 11/15/2021   Chronic obstructive pulmonary disease ([place]) 02/26/2021   Hyperlipidemia associated with type 2 diabetes mellitus ([place]) 11/05/2020   Non-restorative sleep 11/05/2020   Snoring 11/05/2020   Anterolisthesis 07/30/2020   Acute stress  reaction 09/12/2018   Current smoker 06/08/2018   Diabetes mellitus, type II, insulin dependent ([place]) 06/08/2018   Claustrophobia 06/08/2018   Primary insomnia 06/08/2018   Hot flashes 06/08/2018   Abnormal weight gain 06/08/2018   Lower leg edema 11/10/2017   Insomnia due to other mental disorder 10/27/2017   History of substance abuse ([place]) 10/27/2017   GAD (generalized anxiety disorder) 10/27/2017   Class 3 severe obesity due to excess calories with serious comorbidity and body mass index (BMI) of 40.0 to 44.9 in adult ([place]) 08/31/2017   Cerumen impaction 01/24/2015   Hyperlipidemia 01/24/2015   Gastroesophageal reflux disease with esophagitis 01/10/2015   Foot callus 10/03/2014   PTSD (post-traumatic stress disorder) 09/26/2014   Depression 05/19/2014   Anxiety 05/19/2014   Chronic bilateral low back pain with right-sided sciatica 05/19/2014   DDD (degenerative disc disease), lumbar 05/19/2014   Neuropathy 02/05/2014   IT band syndrome 02/05/2014   Sinus tachycardia 02/05/2014   Memory changes 02/05/2014   Word finding difficulty 02/05/2014   Cervical cancer ([place]) 02/05/2014   Type II diabetes mellitus, uncontrolled 02/03/2014   Unspecified asthma(493.90) 02/03/2014   Essential hypertension, benign 02/03/2014   Pulmonary emphysema ([place]) 07/26/2012   Past Medical History:  Diagnosis Date   Anxiety    Arthritis    Asthma    Cancer ([place])    COPD (chronic obstructive pulmonary disease) ([place])    Degenerative disc disease, lumbar  Diabetes mellitus without complication (HCC)    Dyspnea    with exertion due to COPD   GERD (gastroesophageal reflux disease)    History of alcohol abuse    20-30 years ago, patient was going through divorce but does not currently drink any alcohol   History of marijuana use    "its been a long time since I last smoked because of my COPD"   History of ovarian cancer 1984   Hypertension    Pneumonia    Substance abuse ([place])    patient  denies, states she has only ever used marijuana    Family History  Problem Relation Age of Onset   Cancer Paternal Grandmother    Diabetes Paternal Grandmother    Diabetes Paternal Grandfather    Heart attack Maternal Aunt    Cancer Maternal Uncle    Cancer Maternal Grandmother    Heart attack Mother    Cancer Mother    Stroke Mother    Diabetes Father    Heart attack Father    Diabetes Paternal Uncle    Diabetes Paternal Uncle     Past Surgical History:  Procedure Laterality Date   DILATION AND CURETTAGE OF UTERUS     ECTOPIC PREGNANCY SURGERY     patient stated she had to have tube removed   TRICEPS TENDON REPAIR Left 02/07/2022   Procedure: left triceps repair;  Surgeon: Leandrew Koyanagi, MD;  Location: [facility];  Service: Orthopedics;  Laterality: Left;   Social History   Occupational History   Not on file  Tobacco Use   Smoking status: Every Day    Packs/day: 0.50    Types: Cigarettes   Smokeless tobacco: Never  Vaping Use   Vaping Use: Never used  Substance and Sexual Activity   Alcohol use: Not Currently   Drug use: Not Currently    Types: Marijuana    Comment: no longer smokes marijuana due to COPD   Sexual activity: Yes

## 2022-02-25 ENCOUNTER — Encounter: Payer: Self-pay | Admitting: Orthopaedic Surgery

## 2022-02-25 ENCOUNTER — Other Ambulatory Visit: Payer: Self-pay | Admitting: Physician Assistant

## 2022-02-25 DIAGNOSIS — Z794 Long term (current) use of insulin: Secondary | ICD-10-CM

## 2022-02-25 DIAGNOSIS — E119 Type 2 diabetes mellitus without complications: Secondary | ICD-10-CM

## 2022-02-26 ENCOUNTER — Other Ambulatory Visit: Payer: Self-pay | Admitting: Physician Assistant

## 2022-02-26 DIAGNOSIS — Z794 Long term (current) use of insulin: Secondary | ICD-10-CM

## 2022-02-26 DIAGNOSIS — E119 Type 2 diabetes mellitus without complications: Secondary | ICD-10-CM

## 2022-02-26 MED ORDER — GLIPIZIDE 10 MG PO TABS: 10.0000 mg | ORAL_TABLET | Freq: Two times a day (BID) | ORAL | 0 refills | Status: DC

## 2022-02-28 ENCOUNTER — Other Ambulatory Visit: Payer: Self-pay | Admitting: Physician Assistant

## 2022-02-28 DIAGNOSIS — E119 Type 2 diabetes mellitus without complications: Secondary | ICD-10-CM

## 2022-02-28 DIAGNOSIS — Z794 Long term (current) use of insulin: Secondary | ICD-10-CM

## 2022-03-03 ENCOUNTER — Encounter: Payer: Self-pay | Admitting: Orthopaedic Surgery

## 2022-03-04 ENCOUNTER — Encounter: Payer: Self-pay | Admitting: Physician Assistant

## 2022-03-04 ENCOUNTER — Ambulatory Visit (INDEPENDENT_AMBULATORY_CARE_PROVIDER_SITE_OTHER): Payer: 59 | Admitting: Physician Assistant

## 2022-03-04 ENCOUNTER — Other Ambulatory Visit: Payer: Self-pay

## 2022-03-04 VITALS — BP 152/76 | HR 90 | Ht 66.0 in | Wt 276.0 lb

## 2022-03-04 DIAGNOSIS — J432 Centrilobular emphysema: Secondary | ICD-10-CM

## 2022-03-04 DIAGNOSIS — R0602 Shortness of breath: Secondary | ICD-10-CM | POA: Diagnosis not present

## 2022-03-04 DIAGNOSIS — J441 Chronic obstructive pulmonary disease with (acute) exacerbation: Secondary | ICD-10-CM

## 2022-03-04 MED ORDER — METHYLPREDNISOLONE SODIUM SUCC 125 MG IJ SOLR
125.0000 mg | Freq: Once | INTRAMUSCULAR | Status: AC
  Administered 2022-03-04: 125 mg via INTRAMUSCULAR

## 2022-03-04 MED ORDER — METHYLPREDNISOLONE ACETATE 80 MG/ML IJ SUSP
80.0000 mg | Freq: Once | INTRAMUSCULAR | Status: AC
  Administered 2022-03-04: 80 mg via INTRAMUSCULAR

## 2022-03-04 MED ORDER — TRELEGY ELLIPTA 100-62.5-25 MCG/ACT IN AEPB: 1.0000 | INHALATION_SPRAY | Freq: Every day | RESPIRATORY_TRACT | 11 refills | Status: DC

## 2022-03-04 MED ORDER — IPRATROPIUM-ALBUTEROL 0.5-2.5 (3) MG/3ML IN SOLN: 3.0000 mL | Freq: Four times a day (QID) | RESPIRATORY_TRACT | 1 refills | Status: DC | PRN

## 2022-03-04 MED ORDER — IPRATROPIUM-ALBUTEROL 0.5-2.5 (3) MG/3ML IN SOLN
3.0000 mL | Freq: Once | RESPIRATORY_TRACT | Status: AC
  Administered 2022-03-04: 3 mL via RESPIRATORY_TRACT

## 2022-03-04 NOTE — Progress Notes (Unsigned)
° °  Subjective:    Patient ID: Kathleen Le, female    DOB: 1961-12-26, 61 y.o.   MRN: [id number]  Diabetes Pertinent negatives for diabetes include no chest pain.   This patient is a 61 year old female with history pertinent for COPD and triceps tendon rupture repair 2 weeks ago presenting with chief complaint of persistent shortness of breath for the past month. She was previously using Trelegy inhaler but states insurance stopped covering this at some point and she had to switch to Symbicort which is not nearly as helpful for her SOB. On top of the Symbicort she is using her Ventolin BID and still needing her albuterol rescue inhaler up to 3-4 times per day. This SOB is preventing her from exercising. She has also recently stopped using nicotine replacement patches and is smoking about 1 pack per day. She states she had her ruptured triceps tendon repaired 2 weeks ago and it appears to be well healing. She states her surgeon with her progress so far. PT starts tomorrow.  Review of Systems  Constitutional: Negative.  Negative for chills and fever.  HENT:  Negative for congestion, rhinorrhea and sinus pressure.   Respiratory:  Positive for shortness of breath and wheezing. Negative for cough.   Cardiovascular:  Negative for chest pain.  Gastrointestinal:  Negative for constipation and diarrhea.      Objective:   Physical Exam Constitutional:      General: She is not in acute distress.    Appearance: She is obese.  HENT:     Head: Normocephalic and atraumatic.  Cardiovascular:     Rate and Rhythm: Normal rate and regular rhythm.     Pulses: Normal pulses.     Heart sounds: Normal heart sounds. No murmur heard. Pulmonary:     Effort: No respiratory distress.     Breath sounds: Wheezing present.  Musculoskeletal:        General: Swelling (Left upper extremity with well healing surgical scar 2 weeks s/p rupture of triceps tendon) present.  Neurological:     Mental Status: She is alert  and oriented to person, place, and time.  Psychiatric:        Behavior: Behavior normal.        Assessment & Plan:   Kathleen KitchenMarland KitchenZunairah was seen today for follow-up and diabetes.  Diagnoses and all orders for this visit:  COPD exacerbation (HCC) -     methylPREDNISolone sodium succinate (SOLU-MEDROL) 125 mg/2 mL injection 125 mg -     methylPREDNISolone acetate (DEPO-MEDROL) injection 80 mg -     ipratropium-albuterol (DUONEB) 0.5-2.5 (3) MG/3ML nebulizer solution 3 mL  SOB (shortness of breath) -     methylPREDNISolone sodium succinate (SOLU-MEDROL) 125 mg/2 mL injection 125 mg -     methylPREDNISolone acetate (DEPO-MEDROL) injection 80 mg -     ipratropium-albuterol (DUONEB) 0.5-2.5 (3) MG/3ML nebulizer solution 3 mL  Other orders -     Fluticasone-Umeclidin-Vilant (TRELEGY ELLIPTA) 100-62.5-25 MCG/ACT AEPB; Inhale 1 puff into the lungs daily.   Trelegy sample given. Will re-prescribe Trelegy in place of Symbicort as it appears this is now on formulary for patient given previous response to Trelegy. Duoneb nebulizer treatment given today in office and start Duoneb at home in place of Ventolin nebulizer treatment. Solumedrol injection given today in office. Follow up as scheduled for 3 month diabetic follow up or sooner with any new or worsening symptoms.

## 2022-03-05 ENCOUNTER — Encounter: Payer: Self-pay | Admitting: Physical Therapy

## 2022-03-05 ENCOUNTER — Ambulatory Visit: Payer: 59 | Attending: Physician Assistant | Admitting: Physical Therapy

## 2022-03-05 DIAGNOSIS — R29898 Other symptoms and signs involving the musculoskeletal system: Secondary | ICD-10-CM

## 2022-03-05 DIAGNOSIS — M79602 Pain in left arm: Secondary | ICD-10-CM

## 2022-03-05 DIAGNOSIS — M6281 Muscle weakness (generalized): Secondary | ICD-10-CM | POA: Diagnosis not present

## 2022-03-05 NOTE — Patient Instructions (Signed)
Access Code: [email or web address] ?URL: [email or web address] ?Date: 03/05/2022 ?Prepared by: Isabelle Course ? ?Exercises ?Seated Gripping Towel - 1 x daily - 7 x weekly - 3 sets - 10 reps - 3 seconds hold ?Standing 'L' Stretch at Counter - 1 x daily - 7 x weekly - 1 sets - 10 reps - 10 seconds hold ?Seated Scapular Retraction - 1 x daily - 7 x weekly - 2 sets - 10 reps - 3 seconds hold ?Shoulder External Rotation and Scapular Retraction - 1 x daily - 7 x weekly - 2 sets - 10 reps ? ?Patient Education ?Scar Massage ?

## 2022-03-05 NOTE — Therapy (Signed)
Kathleen Le, [place], [address] Phone: [phone]   Fax:  [phone]  Physical Therapy Evaluation  Patient Details  Name: Kathleen Le MRN: [id number] Date of Birth: 12-09-1961 Referring Provider (PT): Dwana Melena   Encounter Date: 03/05/2022   PT End of Session - 03/05/22 1428     Visit Number 1    Number of Visits 12    Date for PT Re-Evaluation 04/16/22    Authorization Type Aetna    PT Start Time 1345    PT Stop Time 1426    PT Time Calculation (min) 41 min    Activity Tolerance Patient tolerated treatment well    Behavior During Therapy [facility] for tasks assessed/performed             Past Medical History:  Diagnosis Date   Anxiety    Arthritis    Asthma    Cancer ([place])    COPD (chronic obstructive pulmonary disease) ([place])    Degenerative disc disease, lumbar    Diabetes mellitus without complication ([place])    Dyspnea    with exertion due to COPD   GERD (gastroesophageal reflux disease)    History of alcohol abuse    20-30 years ago, patient was going through divorce but does not currently drink any alcohol   History of marijuana use    "its been a long time since I last smoked because of my COPD"   History of ovarian cancer 1984   Hypertension    Pneumonia    Substance abuse ([place])    patient denies, states she has only ever used marijuana    Past Surgical History:  Procedure Laterality Date   DILATION AND CURETTAGE OF UTERUS     ECTOPIC PREGNANCY SURGERY     patient stated she had to have tube removed   TRICEPS TENDON REPAIR Left 02/07/2022   Procedure: left triceps repair;  Surgeon: Leandrew Koyanagi, MD;  Location: [facility];  Service: Orthopedics;  Laterality: Left;    There were no vitals filed for this visit.    Subjective Assessment - 03/05/22 1338     Subjective Pt fell in February 2023 and tore her tricep tendon. She underwent Lt tricep tendon repair 02/07/22. After  surgery pt was initially in wet cast x 2 weeks, then cast and stitches removed. Pt has been performing self AROM elbow flexion and extension as per surgeon advice. Pt states she feels it gets "tight" with certain motions, still has a "dull" pain but nothing severe.    Limitations Lifting;House hold activities    Patient Stated Goals improve mobility and strength    Currently in Pain? Yes    Pain Score 2     Pain Location Arm    Pain Orientation Left    Pain Descriptors / Indicators Sharp    Pain Type Surgical pain    Pain Onset 1 to 4 weeks ago    Pain Frequency Intermittent    Aggravating Factors  full elbow extension    Pain Relieving Factors rest, out of position                [facility] PT Assessment - 03/05/22 0001       Assessment   Medical Diagnosis Lt triceps tendon rupture    Referring Provider (PT) Dwana Melena    Onset Date/Surgical Date 02/07/22    Next MD Visit 03/21/22      Precautions   Precaution Comments per protocol  Restrictions   Weight Bearing Restrictions No      Balance Screen   Has the patient fallen in the past 6 months Yes    How many times? 1    Has the patient had a decrease in activity level because of a fear of falling?  No    Is the patient reluctant to leave their home because of a fear of falling?  No      Prior Function   Level of Independence Independent    Vocation Requirements not working      Observation/Other Assessments   Focus on Therapeutic Outcomes (FOTO)  44      ROM / Strength   AROM / PROM / Strength AROM;Strength      AROM   AROM Assessment Site Shoulder;Elbow    Right/Left Shoulder Left    Left Shoulder Flexion 107 Degrees    Left Shoulder ABduction 156 Degrees    Left Shoulder Internal Rotation 80 Degrees    Left Shoulder External Rotation 67 Degrees    Right/Left Elbow Left    Left Elbow Flexion 120    Left Elbow Extension -26      Strength   Overall Strength Comments elbow and shoulder strength not  formally assessed due to precautions    Strength Assessment Site Hand    Right/Left hand Left;Right    Right Hand Grip (lbs) 60    Left Hand Grip (lbs) 30      Palpation   Palpation comment some decreased scar mobility, pt educated on scar tissue massage.                        Objective measurements completed on examination: See above findings.       [place] Adult PT Treatment/Exercise - 03/05/22 0001       Exercises   Exercises Shoulder      Shoulder Exercises: Seated   Other Seated Exercises scap squeeze x 10, money x 10    Other Seated Exercises gross grip towel roll 3 sec x 10      Shoulder Exercises: Stretch   Table Stretch - Flexion 3 reps;10 seconds    Table Stretch -Flexion Limitations at counter      Manual Therapy   Manual therapy comments pt educated on scar massage - handout given                     PT Education - 03/05/22 1420     Education Details PT POC and goals, HEP    Person(s) Educated Patient    Methods Explanation;Demonstration;Handout    Comprehension Verbalized understanding;Returned demonstration                 PT Long Term Goals - 03/05/22 1431       PT LONG TERM GOAL #1   Title Pt will be independent with HEP    Time 6    Period Weeks    Status New    Target Date 04/16/22      PT LONG TERM GOAL #2   Title Pt will improve Lt elbow extension to 0 to improve ease with dressing    Time 6    Period Weeks    Status New    Target Date 04/16/22      PT LONG TERM GOAL #3   Title Pt will improve Lt UE strength to 4/5 to improve ability to complete IADLs    Time 6  Period Weeks    Status New    Target Date 04/16/22      PT LONG TERM GOAL #4   Title Pt will improve FOTO to >= 63 to demo improved functional mobility    Time 6    Period Weeks    Status New    Target Date 04/16/22                    Plan - 03/05/22 1429     Clinical Impression Statement Pt is a 61 y/o female s/p Lt  triceps tendon repair 02/07/22. Pt presents with decreased Lt UE strength and ROM, impaired functional mobility, decreased scar mobility, increased pain. Pt will benefit from skilled PT to address deficits and improve functional mobility.    Personal Factors and Comorbidities Comorbidity 3+;Time since onset of injury/illness/exacerbation    Examination-Activity Limitations Lift;Carry    Examination-Participation Restrictions Meal Prep;Cleaning;Shop    Stability/Clinical Decision Making Stable/Uncomplicated    Clinical Decision Making Low    Rehab Potential Good    PT Frequency 2x / week    PT Duration 6 weeks    PT Treatment/Interventions Aquatic Therapy;Electrical Stimulation;Iontophoresis '4mg'$ /ml Dexamethasone;Moist Heat;Cryotherapy;Neuromuscular re-education;Therapeutic exercise;Therapeutic activities;Patient/family education;Manual techniques;Passive range of motion;Dry needling;Taping;Vasopneumatic Device    PT Next Visit Plan assess HEP, elbow/shoulder mobility and strength per protocol    PT Home Exercise Plan [email or web address]    Consulted and Agree with Plan of Care Patient             Patient will benefit from skilled therapeutic intervention in order to improve the following deficits and impairments:  Pain, Impaired UE functional use, Decreased strength, Decreased activity tolerance, Decreased range of motion, Decreased scar mobility  Visit Diagnosis: Pain in left arm - Plan: PT plan of care cert/re-cert  Muscle weakness (generalized) - Plan: PT plan of care cert/re-cert  Other symptoms and signs involving the musculoskeletal system - Plan: PT plan of care cert/re-cert     Problem List Patient Active Problem List   Diagnosis Date Noted   Triceps tendon rupture, left, initial encounter 02/07/2022   Fall 12/31/2021   Contusion of left arm 12/31/2021   Acute left-sided low back pain without sciatica 12/31/2021   Neck pain 12/31/2021   Encounter for smoking cessation counseling  11/15/2021   Chronic obstructive pulmonary disease ([place]) 02/26/2021   Hyperlipidemia associated with type 2 diabetes mellitus ([place]) 11/05/2020   Non-restorative sleep 11/05/2020   Snoring 11/05/2020   Anterolisthesis 07/30/2020   Acute stress reaction 09/12/2018   Current smoker 06/08/2018   Diabetes mellitus, type II, insulin dependent ([place]) 06/08/2018   Claustrophobia 06/08/2018   Primary insomnia 06/08/2018   Hot flashes 06/08/2018   Abnormal weight gain 06/08/2018   Lower leg edema 11/10/2017   Insomnia due to other mental disorder 10/27/2017   History of substance abuse ([place]) 10/27/2017   GAD (generalized anxiety disorder) 10/27/2017   Class 3 severe obesity due to excess calories with serious comorbidity and body mass index (BMI) of 40.0 to 44.9 in adult ([place]) 08/31/2017   Cerumen impaction 01/24/2015   Hyperlipidemia 01/24/2015   Gastroesophageal reflux disease with esophagitis 01/10/2015   Foot callus 10/03/2014   PTSD (post-traumatic stress disorder) 09/26/2014   Depression 05/19/2014   Anxiety 05/19/2014   Chronic bilateral low back pain with right-sided sciatica 05/19/2014   DDD (degenerative disc disease), lumbar 05/19/2014   Neuropathy 02/05/2014   IT band syndrome 02/05/2014   Sinus tachycardia 02/05/2014   Memory changes 02/05/2014  Word finding difficulty 02/05/2014   Cervical cancer ([place]) 02/05/2014   Type II diabetes mellitus, uncontrolled 02/03/2014   Unspecified asthma(493.90) 02/03/2014   Essential hypertension, benign 02/03/2014   Pulmonary emphysema ([place]) 07/26/2012    Aarik Blank, PT 03/05/2022, 2:35 PM  [facility] [place], [place], [address] Phone: [phone]   Fax:  [phone]  Name: Sophee Mckimmy MRN: [id number] Date of Birth: 05-Apr-1961

## 2022-03-13 ENCOUNTER — Ambulatory Visit: Payer: 59 | Admitting: Physical Therapy

## 2022-03-13 ENCOUNTER — Other Ambulatory Visit: Payer: Self-pay

## 2022-03-13 DIAGNOSIS — M79602 Pain in left arm: Secondary | ICD-10-CM | POA: Diagnosis not present

## 2022-03-13 DIAGNOSIS — R29898 Other symptoms and signs involving the musculoskeletal system: Secondary | ICD-10-CM | POA: Diagnosis not present

## 2022-03-13 DIAGNOSIS — M6281 Muscle weakness (generalized): Secondary | ICD-10-CM | POA: Diagnosis not present

## 2022-03-13 NOTE — Therapy (Signed)
[place] ?[facility] ?[place] ?[place], [place], [address] ?Phone: [phone]   Fax:  [phone] ? ?Physical Therapy Treatment ? ?Patient Details  ?Name: Kathleen Le ?MRN: [id number] ?Date of Birth: 1961-12-02 ?Referring Provider (PT): Dwana Melena ? ? ?Encounter Date: 03/13/2022 ? ? PT End of Session - 03/13/22 1447   ? ? Visit Number 2   ? Number of Visits 12   ? Date for PT Re-Evaluation 04/16/22   ? Authorization Type Aetna   ? PT Start Time 1400   ? PT Stop Time 1224   ? PT Time Calculation (min) 32 min   ? Activity Tolerance Patient tolerated treatment well   ? Behavior During Therapy [facility] for tasks assessed/performed   ? ?  ?  ? ?  ? ? ?Past Medical History:  ?Diagnosis Date  ? Anxiety   ? Arthritis   ? Asthma   ? Cancer [facility])   ? COPD (chronic obstructive pulmonary disease) ([place])   ? Degenerative disc disease, lumbar   ? Diabetes mellitus without complication ([place])   ? Dyspnea   ? with exertion due to COPD  ? GERD (gastroesophageal reflux disease)   ? History of alcohol abuse   ? 20-30 years ago, patient was going through divorce but does not currently drink any alcohol  ? History of marijuana use   ? "its been a long time since I last smoked because of my COPD"  ? History of ovarian cancer 1984  ? Hypertension   ? Pneumonia   ? Substance abuse ([place])   ? patient denies, states she has only ever used marijuana  ? ? ?Past Surgical History:  ?Procedure Laterality Date  ? DILATION AND CURETTAGE OF UTERUS    ? ECTOPIC PREGNANCY SURGERY    ? patient stated she had to have tube removed  ? TRICEPS TENDON REPAIR Left 02/07/2022  ? Procedure: left triceps repair;  Surgeon: Leandrew Koyanagi, MD;  Location: [facility];  Service: Orthopedics;  Laterality: Left;  ? ? ?There were no vitals filed for this visit. ? ? Subjective Assessment - 03/13/22 1358   ? ? Subjective Pt states she feels "pretty good". She states that "it is getting better"   ? Patient Stated Goals improve  mobility and strength   ? Currently in Pain? No/denies   ? ?  ?  ? ?  ? ? ? ? ? [facility] PT Assessment - 03/13/22 0001   ? ?  ? Assessment  ? Medical Diagnosis Lt triceps tendon rupture   ? Referring Provider (PT) Dwana Melena   ? Onset Date/Surgical Date 02/07/22   ? Next MD Visit 03/21/22   ?  ? AROM  ? Left Shoulder Flexion 170 Degrees   ? ?  ?  ? ?  ? ? ? ? ? ? ? ? ? ? ? ? ? ? ? ? [place] Adult PT Treatment/Exercise - 03/13/22 0001   ? ?  ? Shoulder Exercises: Supine  ? Protraction 20 reps   ?  ? Shoulder Exercises: Seated  ? Flexion 20 reps   ? Flexion Limitations then scaption x 20   ? Other Seated Exercises scap squeeze x 10, money x 10   ? Other Seated Exercises grip x 10 red then x 10 orange ball   ?  ? Shoulder Exercises: Sidelying  ? External Rotation 10 reps   ?  ? Shoulder Exercises: Pulleys  ? Flexion 2 minutes   ? ABduction  2 minutes   ?  ? Shoulder Exercises: ROM/Strengthening  ? Rhythmic Stabilization, Supine 3 x 30 seconds proximal to elbow   ?  ? Manual Therapy  ? Manual Therapy Passive ROM   ? Passive ROM PROM Lt elbow and shoulder to tolerance   ? ?  ?  ? ?  ? ? ? ? ? ? ? ? ? ? ? ? ? ? ? PT Long Term Goals - 03/05/22 1431   ? ?  ? PT LONG TERM GOAL #1  ? Title Pt will be independent with HEP   ? Time 6   ? Period Weeks   ? Status New   ? Target Date 04/16/22   ?  ? PT LONG TERM GOAL #2  ? Title Pt will improve Lt elbow extension to 0 to improve ease with dressing   ? Time 6   ? Period Weeks   ? Status New   ? Target Date 04/16/22   ?  ? PT LONG TERM GOAL #3  ? Title Pt will improve Lt UE strength to 4/5 to improve ability to complete IADLs   ? Time 6   ? Period Weeks   ? Status New   ? Target Date 04/16/22   ?  ? PT LONG TERM GOAL #4  ? Title Pt will improve FOTO to >= 63 to demo improved functional mobility   ? Time 6   ? Period Weeks   ? Status New   ? Target Date 04/16/22   ? ?  ?  ? ?  ? ? ? ? ? ? ? ? Plan - 03/13/22 1447   ? ? Clinical Impression Statement Pt with much improved shoulder and elbow  ROM since initial eval. Able to progress to shouler AROM/strengthening without weights with good tolerance. Pt educated on protocol and continued lifting restrictions at this time. She is progressing well towards goals   ? PT Next Visit Plan progress elbow and shoulder strength per protocol, update HEP   ? PT [place]   ? Consulted and Agree with Plan of Care Patient   ? ?  ?  ? ?  ? ? ?Patient will benefit from skilled therapeutic intervention in order to improve the following deficits and impairments:    ? ?Visit Diagnosis: ?Pain in left arm ? ?Muscle weakness (generalized) ? ?Other symptoms and signs involving the musculoskeletal system ? ? ? ? ?Problem List ?Patient Active Problem List  ? Diagnosis Date Noted  ? Triceps tendon rupture, left, initial encounter 02/07/2022  ? Fall 12/31/2021  ? Contusion of left arm 12/31/2021  ? Acute left-sided low back pain without sciatica 12/31/2021  ? Neck pain 12/31/2021  ? Encounter for smoking cessation counseling 11/15/2021  ? Chronic obstructive pulmonary disease ([place]) 02/26/2021  ? Hyperlipidemia associated with type 2 diabetes mellitus ([place]) 11/05/2020  ? Non-restorative sleep 11/05/2020  ? Snoring 11/05/2020  ? Anterolisthesis 07/30/2020  ? Acute stress reaction 09/12/2018  ? Current smoker 06/08/2018  ? Diabetes mellitus, type II, insulin dependent ([place]) 06/08/2018  ? Claustrophobia 06/08/2018  ? Primary insomnia 06/08/2018  ? Hot flashes 06/08/2018  ? Abnormal weight gain 06/08/2018  ? Lower leg edema 11/10/2017  ? Insomnia due to other mental disorder 10/27/2017  ? History of substance abuse ([place]) 10/27/2017  ? GAD (generalized anxiety disorder) 10/27/2017  ? Class 3 severe obesity due to excess calories with serious comorbidity and body mass index (BMI) of 40.0 to 44.9 in  adult [facility]) 08/31/2017  ? Cerumen impaction 01/24/2015  ? Hyperlipidemia 01/24/2015  ? Gastroesophageal reflux disease with esophagitis 01/10/2015  ? Foot callus 10/03/2014  ?  PTSD (post-traumatic stress disorder) 09/26/2014  ? Depression 05/19/2014  ? Anxiety 05/19/2014  ? Chronic bilateral low back pain with right-sided sciatica 05/19/2014  ? DDD (degenerative disc disease), lumbar 05/19/2014  ? Neuropathy 02/05/2014  ? IT band syndrome 02/05/2014  ? Sinus tachycardia 02/05/2014  ? Memory changes 02/05/2014  ? Word finding difficulty 02/05/2014  ? Cervical cancer ([place]) 02/05/2014  ? Type II diabetes mellitus, uncontrolled 02/03/2014  ? Unspecified asthma(493.90) 02/03/2014  ? Essential hypertension, benign 02/03/2014  ? Pulmonary emphysema ([place]) 07/26/2012  ? ? ?Giulia Hickey, PT ?03/13/2022, 2:49 PM ? ?[place] ?[facility] ?[place] ?[place], [place], [address] ?Phone: [phone]   Fax:  [phone] ? ?Name: Brianca Fortenberry ?MRN: [id number] ?Date of Birth: [birth year] ? ? ? ?

## 2022-03-14 ENCOUNTER — Other Ambulatory Visit: Payer: Self-pay | Admitting: Physician Assistant

## 2022-03-14 DIAGNOSIS — G629 Polyneuropathy, unspecified: Secondary | ICD-10-CM

## 2022-03-14 DIAGNOSIS — I10 Essential (primary) hypertension: Secondary | ICD-10-CM

## 2022-03-14 DIAGNOSIS — M5136 Other intervertebral disc degeneration, lumbar region: Secondary | ICD-10-CM

## 2022-03-14 DIAGNOSIS — G8929 Other chronic pain: Secondary | ICD-10-CM

## 2022-03-18 ENCOUNTER — Encounter: Payer: 59 | Admitting: Physical Therapy

## 2022-03-20 ENCOUNTER — Ambulatory Visit: Payer: 59 | Admitting: Physical Therapy

## 2022-03-20 ENCOUNTER — Other Ambulatory Visit: Payer: Self-pay

## 2022-03-20 DIAGNOSIS — M6281 Muscle weakness (generalized): Secondary | ICD-10-CM

## 2022-03-20 DIAGNOSIS — M79602 Pain in left arm: Secondary | ICD-10-CM | POA: Diagnosis not present

## 2022-03-20 DIAGNOSIS — R29898 Other symptoms and signs involving the musculoskeletal system: Secondary | ICD-10-CM

## 2022-03-20 NOTE — Therapy (Signed)
[facility] ?[facility] ?[place] ?[place], [place], [address] ?Phone: [phone]   Fax:  [phone] ? ?Physical Therapy Treatment ? ?Patient Details  ?Name: Kathleen Le ?MRN: [id number] ?Date of Birth: 05-04-1961 ?Referring Provider (PT): Dwana Melena ? ? ?Encounter Date: 03/20/2022 ? ? PT End of Session - 03/20/22 1449   ? ? Visit Number 3   ? Number of Visits 12   ? Date for PT Re-Evaluation 04/16/22   ? Authorization Type Aetna   ? PT Start Time 2725   ? PT Stop Time 1530   ? PT Time Calculation (min) 43 min   ? Activity Tolerance Patient tolerated treatment well   ? Behavior During Therapy [facility] for tasks assessed/performed   ? ?  ?  ? ?  ? ? ?Past Medical History:  ?Diagnosis Date  ? Anxiety   ? Arthritis   ? Asthma   ? Cancer [facility])   ? COPD (chronic obstructive pulmonary disease) ([place])   ? Degenerative disc disease, lumbar   ? Diabetes mellitus without complication ([place])   ? Dyspnea   ? with exertion due to COPD  ? GERD (gastroesophageal reflux disease)   ? History of alcohol abuse   ? 20-30 years ago, patient was going through divorce but does not currently drink any alcohol  ? History of marijuana use   ? "its been a long time since I last smoked because of my COPD"  ? History of ovarian cancer 1984  ? Hypertension   ? Pneumonia   ? Substance abuse ([place])   ? patient denies, states she has only ever used marijuana  ? ? ?Past Surgical History:  ?Procedure Laterality Date  ? DILATION AND CURETTAGE OF UTERUS    ? ECTOPIC PREGNANCY SURGERY    ? patient stated she had to have tube removed  ? TRICEPS TENDON REPAIR Left 02/07/2022  ? Procedure: left triceps repair;  Surgeon: Leandrew Koyanagi, MD;  Location: [facility];  Service: Orthopedics;  Laterality: Left;  ? ? ?There were no vitals filed for this visit. ? ? Subjective Assessment - 03/20/22 1450   ? ? Subjective Pt reports exercises are going well. No issues to report.   ? Limitations Lifting;House hold activities   ?  Patient Stated Goals improve mobility and strength   ? Currently in Pain? No/denies   ? ?  ?  ? ?  ? ? ? ? ? [facility] PT Assessment - 03/20/22 0001   ? ?  ? AROM  ? Left Elbow Flexion 140   ? Left Elbow Extension 0   ? ?  ?  ? ?  ? ? ? ? ? ? ? ? ? ? ? ? ? ? ? ? [place] Adult PT Treatment/Exercise - 03/20/22 0001   ? ?  ? Elbow Exercises  ? Elbow Flexion Strengthening;Left;5 reps   iso 5 sec  ? Elbow Extension Strengthening;Left;5 reps   iso 5 sec  ? Forearm Supination AROM;10 reps   ? Forearm Pronation AROM;10 reps   ? Other elbow exercises wrist flex/ext with yellow putty squeeze x10; wrist ulner and radial deviation with yellow putty squeeze x10   ? Other elbow exercises finger extensions into putty x10   ?  ? Shoulder Exercises: Seated  ? Flexion 20 reps   ? Flexion Limitations scaption x20   ? Abduction 20 reps   ? Other Seated Exercises scap squeeze 2x 10, money 2x 10, "W" 2x0   ?  ?  Shoulder Exercises: Isometric Strengthening  ? Flexion 5X5"   ? Extension 5X5"   ? External Rotation 5X5"   ? Internal Rotation 5X5"   ? ABduction 5X5"   ?  ? Manual Therapy  ? Manual therapy comments scar massage along   ? Passive ROM PROM Lt elbow and shoulder to tolerance   ? ?  ?  ? ?  ? ? ? ? ? ? ? ? ? ? ? ? ? ? ? PT Long Term Goals - 03/05/22 1431   ? ?  ? PT LONG TERM GOAL #1  ? Title Pt will be independent with HEP   ? Time 6   ? Period Weeks   ? Status New   ? Target Date 04/16/22   ?  ? PT LONG TERM GOAL #2  ? Title Pt will improve Lt elbow extension to 0 to improve ease with dressing   ? Time 6   ? Period Weeks   ? Status New   ? Target Date 04/16/22   ?  ? PT LONG TERM GOAL #3  ? Title Pt will improve Lt UE strength to 4/5 to improve ability to complete IADLs   ? Time 6   ? Period Weeks   ? Status New   ? Target Date 04/16/22   ?  ? PT LONG TERM GOAL #4  ? Title Pt will improve FOTO to >= 63 to demo improved functional mobility   ? Time 6   ? Period Weeks   ? Status New   ? Target Date 04/16/22   ? ?  ?  ? ?  ? ? ? ? ? ? ? ?  Plan - 03/20/22 1529   ? ? Clinical Impression Statement Pt demonstrates increasing shoulder and elbow ROM. Initiated isometrics this session with good pt tolerance. Worked on Theme park manager.   ? Personal Factors and Comorbidities Comorbidity 3+;Time since onset of injury/illness/exacerbation   ? Examination-Activity Limitations Lift;Carry   ? Examination-Participation Restrictions Meal Prep;Cleaning;Shop   ? PT Treatment/Interventions Aquatic Therapy;Electrical Stimulation;Iontophoresis '4mg'$ /ml Dexamethasone;Moist Heat;Cryotherapy;Neuromuscular re-education;Therapeutic exercise;Therapeutic activities;Patient/family education;Manual techniques;Passive range of motion;Dry needling;Taping;Vasopneumatic Device   ? PT Next Visit Plan progress elbow and shoulder strength per protocol, update HEP   ? PT [place]   ? Consulted and Agree with Plan of Care Patient   ? ?  ?  ? ?  ? ? ?Patient will benefit from skilled therapeutic intervention in order to improve the following deficits and impairments:  Pain, Impaired UE functional use, Decreased strength, Decreased activity tolerance, Decreased range of motion, Decreased scar mobility ? ?Visit Diagnosis: ?Pain in left arm ? ?Other symptoms and signs involving the musculoskeletal system ? ?Muscle weakness (generalized) ? ? ? ? ?Problem List ?Patient Active Problem List  ? Diagnosis Date Noted  ? Triceps tendon rupture, left, initial encounter 02/07/2022  ? Fall 12/31/2021  ? Contusion of left arm 12/31/2021  ? Acute left-sided low back pain without sciatica 12/31/2021  ? Neck pain 12/31/2021  ? Encounter for smoking cessation counseling 11/15/2021  ? Chronic obstructive pulmonary disease ([place]) 02/26/2021  ? Hyperlipidemia associated with type 2 diabetes mellitus ([place]) 11/05/2020  ? Non-restorative sleep 11/05/2020  ? Snoring 11/05/2020  ? Anterolisthesis 07/30/2020  ? Acute stress reaction 09/12/2018  ? Current smoker 06/08/2018  ? Diabetes  mellitus, type II, insulin dependent ([place]) 06/08/2018  ? Claustrophobia 06/08/2018  ? Primary insomnia 06/08/2018  ? Hot flashes 06/08/2018  ? Abnormal weight gain 06/08/2018  ?  Lower leg edema 11/10/2017  ? Insomnia due to other mental disorder 10/27/2017  ? History of substance abuse ([place]) 10/27/2017  ? GAD (generalized anxiety disorder) 10/27/2017  ? Class 3 severe obesity due to excess calories with serious comorbidity and body mass index (BMI) of 40.0 to 44.9 in adult [facility]) 08/31/2017  ? Cerumen impaction 01/24/2015  ? Hyperlipidemia 01/24/2015  ? Gastroesophageal reflux disease with esophagitis 01/10/2015  ? Foot callus 10/03/2014  ? PTSD (post-traumatic stress disorder) 09/26/2014  ? Depression 05/19/2014  ? Anxiety 05/19/2014  ? Chronic bilateral low back pain with right-sided sciatica 05/19/2014  ? DDD (degenerative disc disease), lumbar 05/19/2014  ? Neuropathy 02/05/2014  ? IT band syndrome 02/05/2014  ? Sinus tachycardia 02/05/2014  ? Memory changes 02/05/2014  ? Word finding difficulty 02/05/2014  ? Cervical cancer ([place]) 02/05/2014  ? Type II diabetes mellitus, uncontrolled 02/03/2014  ? Unspecified asthma(493.90) 02/03/2014  ? Essential hypertension, benign 02/03/2014  ? Pulmonary emphysema ([place]) 07/26/2012  ? ? ?Ezeriah Luty April Gordy Levan, PT, DPT ?03/20/2022, 3:31 PM ? ?[place] ?[facility] ?[place] ?[place], [place], [address] ?Phone: [phone]   Fax:  [phone] ? ?Name: Tauheedah Bok ?MRN: [id number] ?Date of Birth: [birth year] ? ? ? ?

## 2022-03-21 ENCOUNTER — Ambulatory Visit: Payer: 59 | Admitting: Physician Assistant

## 2022-03-21 DIAGNOSIS — S46312A Strain of muscle, fascia and tendon of triceps, left arm, initial encounter: Secondary | ICD-10-CM

## 2022-03-21 NOTE — Progress Notes (Signed)
? ?Post-Op Visit Note ?  ?Patient: Kathleen Le           ?Date of Birth: Nov 14, 1961           ?MRN: [id number] ?Visit Date: 03/21/2022 ?PCP: Donella Stade, PA-C ? ? ?Assessment & Plan: ? ?Chief Complaint:  ?Chief Complaint  ?Patient presents with  ? Left Elbow - Pain  ? ?Visit Diagnoses:  ?1. Triceps tendon rupture, left, initial encounter   ? ? ?Plan: Patient is a pleasant 61 year old female who comes in today approximately 6 weeks status post left triceps repair 02/07/2022.  She has been doing great.  No pain.  She has been therapy once a week and working on a home exercise program.  Examination of the left elbow reveals a fully healed surgical scar without complication.  She has full flexion and extension of the elbow.  She is neurovascular intact distally.  At this point, she will continue with physical therapy where they will start to advance to light strengthening.  She will follow-up with [place] in 6 weeks time for recheck.  Call with concerns or questions in the meantime. ? ?Follow-Up Instructions: Return in about 6 weeks (around 05/02/2022).  ? ?Orders:  ?No orders of the defined types were placed in this encounter. ? ?No orders of the defined types were placed in this encounter. ? ? ?Imaging: ?No new imaging ? ?PMFS History: ?Patient Active Problem List  ? Diagnosis Date Noted  ? Triceps tendon rupture, left, initial encounter 02/07/2022  ? Fall 12/31/2021  ? Contusion of left arm 12/31/2021  ? Acute left-sided low back pain without sciatica 12/31/2021  ? Neck pain 12/31/2021  ? Encounter for smoking cessation counseling 11/15/2021  ? Chronic obstructive pulmonary disease ([place]) 02/26/2021  ? Hyperlipidemia associated with type 2 diabetes mellitus ([place]) 11/05/2020  ? Non-restorative sleep 11/05/2020  ? Snoring 11/05/2020  ? Anterolisthesis 07/30/2020  ? Acute stress reaction 09/12/2018  ? Current smoker 06/08/2018  ? Diabetes mellitus, type II, insulin dependent ([place]) 06/08/2018  ? Claustrophobia 06/08/2018  ?  Primary insomnia 06/08/2018  ? Hot flashes 06/08/2018  ? Abnormal weight gain 06/08/2018  ? Lower leg edema 11/10/2017  ? Insomnia due to other mental disorder 10/27/2017  ? History of substance abuse ([place]) 10/27/2017  ? GAD (generalized anxiety disorder) 10/27/2017  ? Class 3 severe obesity due to excess calories with serious comorbidity and body mass index (BMI) of 40.0 to 44.9 in adult [facility]) 08/31/2017  ? Cerumen impaction 01/24/2015  ? Hyperlipidemia 01/24/2015  ? Gastroesophageal reflux disease with esophagitis 01/10/2015  ? Foot callus 10/03/2014  ? PTSD (post-traumatic stress disorder) 09/26/2014  ? Depression 05/19/2014  ? Anxiety 05/19/2014  ? Chronic bilateral low back pain with right-sided sciatica 05/19/2014  ? DDD (degenerative disc disease), lumbar 05/19/2014  ? Neuropathy 02/05/2014  ? IT band syndrome 02/05/2014  ? Sinus tachycardia 02/05/2014  ? Memory changes 02/05/2014  ? Word finding difficulty 02/05/2014  ? Cervical cancer ([place]) 02/05/2014  ? Type II diabetes mellitus, uncontrolled 02/03/2014  ? Unspecified asthma(493.90) 02/03/2014  ? Essential hypertension, benign 02/03/2014  ? Pulmonary emphysema ([place]) 07/26/2012  ? ?Past Medical History:  ?Diagnosis Date  ? Anxiety   ? Arthritis   ? Asthma   ? Cancer [facility])   ? COPD (chronic obstructive pulmonary disease) ([place])   ? Degenerative disc disease, lumbar   ? Diabetes mellitus without complication ([place])   ? Dyspnea   ? with exertion due to COPD  ? GERD (gastroesophageal reflux  disease)   ? History of alcohol abuse   ? 20-30 years ago, patient was going through divorce but does not currently drink any alcohol  ? History of marijuana use   ? "its been a long time since I last smoked because of my COPD"  ? History of ovarian cancer 1984  ? Hypertension   ? Pneumonia   ? Substance abuse ([place])   ? patient denies, states she has only ever used marijuana  ?  ?Family History  ?Problem Relation Age of Onset  ? Cancer Paternal Grandmother   ? Diabetes  Paternal Grandmother   ? Diabetes Paternal Grandfather   ? Heart attack Maternal Aunt   ? Cancer Maternal Uncle   ? Cancer Maternal Grandmother   ? Heart attack Mother   ? Cancer Mother   ? Stroke Mother   ? Diabetes Father   ? Heart attack Father   ? Diabetes Paternal Uncle   ? Diabetes Paternal Uncle   ?  ?Past Surgical History:  ?Procedure Laterality Date  ? DILATION AND CURETTAGE OF UTERUS    ? ECTOPIC PREGNANCY SURGERY    ? patient stated she had to have tube removed  ? TRICEPS TENDON REPAIR Left 02/07/2022  ? Procedure: left triceps repair;  Surgeon: Leandrew Koyanagi, MD;  Location: [place];  Service: Orthopedics;  Laterality: Left;  ? ?Social History  ? ?Occupational History  ? Not on file  ?Tobacco Use  ? Smoking status: Every Day  ?  Packs/day: 0.50  ?  Types: Cigarettes  ? Smokeless tobacco: Never  ?Vaping Use  ? Vaping Use: Never used  ?Substance and Sexual Activity  ? Alcohol use: Not Currently  ? Drug use: Not Currently  ?  Types: Marijuana  ?  Comment: no longer smokes marijuana due to COPD  ? Sexual activity: Yes  ? ? ? ?

## 2022-03-25 ENCOUNTER — Other Ambulatory Visit: Payer: Self-pay | Admitting: Physician Assistant

## 2022-03-26 ENCOUNTER — Encounter: Payer: Self-pay | Admitting: Physician Assistant

## 2022-03-26 ENCOUNTER — Ambulatory Visit: Payer: 59 | Admitting: Physical Therapy

## 2022-03-26 DIAGNOSIS — M79602 Pain in left arm: Secondary | ICD-10-CM | POA: Diagnosis not present

## 2022-03-26 DIAGNOSIS — M6281 Muscle weakness (generalized): Secondary | ICD-10-CM | POA: Diagnosis not present

## 2022-03-26 DIAGNOSIS — R29898 Other symptoms and signs involving the musculoskeletal system: Secondary | ICD-10-CM | POA: Diagnosis not present

## 2022-03-26 NOTE — Therapy (Signed)
[place] ?[facility] ?[address] ?[place], [place], [address] ?Phone: [phone]   Fax:  [phone] ? ?Physical Therapy Treatment ? ?Patient Details  ?Name: Kathleen Le ?MRN: [id number] ?Date of Birth: [birth year] ?Referring Provider (PT): Dwana Melena ? ? ?Encounter Date: 03/26/2022 ? ? PT End of Session - 03/26/22 1507   ? ? Visit Number 4   ? Number of Visits 12   ? Date for PT Re-Evaluation 04/16/22   ? Authorization Type Aetna   ? PT Start Time 1510   ? PT Stop Time 1550   ? PT Time Calculation (min) 40 min   ? Activity Tolerance Patient tolerated treatment well   ? Behavior During Therapy [facility] for tasks assessed/performed   ? ?  ?  ? ?  ? ? ?Past Medical History:  ?Diagnosis Date  ? Anxiety   ? Arthritis   ? Asthma   ? Cancer [facility])   ? COPD (chronic obstructive pulmonary disease) ([place])   ? Degenerative disc disease, lumbar   ? Diabetes mellitus without complication ([place])   ? Dyspnea   ? with exertion due to COPD  ? GERD (gastroesophageal reflux disease)   ? History of alcohol abuse   ? 20-30 years ago, patient was going through divorce but does not currently drink any alcohol  ? History of marijuana use   ? "its been a long time since I last smoked because of my COPD"  ? History of ovarian cancer 1984  ? Hypertension   ? Pneumonia   ? Substance abuse ([place])   ? patient denies, states she has only ever used marijuana  ? ? ?Past Surgical History:  ?Procedure Laterality Date  ? DILATION AND CURETTAGE OF UTERUS    ? ECTOPIC PREGNANCY SURGERY    ? patient stated she had to have tube removed  ? TRICEPS TENDON REPAIR Left 02/07/2022  ? Procedure: left triceps repair;  Surgeon: Leandrew Koyanagi, MD;  Location: [place];  Service: Orthopedics;  Laterality: Left;  ? ? ?There were no vitals filed for this visit. ? ? Subjective Assessment - 03/26/22 1509   ? ? Subjective Pt saw doctor and said that everything looks good. Pt reports some soreness with shoulder exercise.   ?  Limitations Lifting;House hold activities   ? Patient Stated Goals improve mobility and strength   ? Currently in Pain? No/denies   ? ?  ?  ? ?  ? ? ? ? ? [facility] PT Assessment - 03/26/22 0001   ? ?  ? Assessment  ? Medical Diagnosis Lt triceps tendon rupture   ? Referring Provider (PT) Dwana Melena   ? Onset Date/Surgical Date 02/07/22   ? ?  ?  ? ?  ? ? ? ? ? ? ? ? ? ? ? ? ? ? ? ? [place] Adult PT Treatment/Exercise - 03/26/22 0001   ? ?  ? Elbow Exercises  ? Elbow Flexion Strengthening;Left;10 reps   ? Bar Weights/Barbell (Elbow Flexion) 2 lbs   ? Elbow Extension Strengthening   performed overhead  ? Bar Weights/Barbell (Elbow Extension) 2 lbs   ? Forearm Supination Strengthening;Left;20 reps   ? Bar Weights/Barbell (Forearm Supination) 2 lbs   ? Forearm Pronation Strengthening;Left;Seated;20 reps   ? Bar Weights/Barbell (Forearm Pronation) 2 lbs   ? Other elbow exercises wrist flex/ext with 2 lbs 2x10   ?  ? Shoulder Exercises: Seated  ? Flexion 20 reps   ? Flexion Limitations scaption x20   ?  Other Seated Exercises elbow flexion/ext x20 AROM   ?  ? Shoulder Exercises: Standing  ? External Rotation Strengthening;Both;20 reps;Theraband   ? Theraband Level (Shoulder External Rotation) Level 2 (Red)   ? Internal Rotation Strengthening;Left;20 reps;Theraband   ? Theraband Level (Shoulder Internal Rotation) Level 2 (Red)   ? Extension Strengthening;Both;20 reps;Theraband   ? Theraband Level (Shoulder Extension) Level 2 (Red)   ? Row Strengthening;Both;20 reps;Theraband   ? Theraband Level (Shoulder Row) Level 2 (Red)   ?  ? Shoulder Exercises: ROM/Strengthening  ? UBE (Upper Arm Bike) L1, 2 min forward, 2 min backward   ? Wall Pushups 20 reps   ? ?  ?  ? ?  ? ? ? ? ? ? ? ? ? ? PT Education - 03/26/22 1555   ? ? Education Details Discussed desensitization techniques along L elbow. Discussed updated exercises   ? Person(s) Educated Patient   ? Methods Explanation;Demonstration;Tactile cues;Verbal cues;Handout   ?  Comprehension Verbalized understanding;Returned demonstration;Verbal cues required;Tactile cues required   ? ?  ?  ? ?  ? ? ? ? ? ? PT Long Term Goals - 03/05/22 1431   ? ?  ? PT LONG TERM GOAL #1  ? Title Pt will be independent with HEP   ? Time 6   ? Period Weeks   ? Status New   ? Target Date 04/16/22   ?  ? PT LONG TERM GOAL #2  ? Title Pt will improve Lt elbow extension to 0 to improve ease with dressing   ? Time 6   ? Period Weeks   ? Status New   ? Target Date 04/16/22   ?  ? PT LONG TERM GOAL #3  ? Title Pt will improve Lt UE strength to 4/5 to improve ability to complete IADLs   ? Time 6   ? Period Weeks   ? Status New   ? Target Date 04/16/22   ?  ? PT LONG TERM GOAL #4  ? Title Pt will improve FOTO to >= 63 to demo improved functional mobility   ? Time 6   ? Period Weeks   ? Status New   ? Target Date 04/16/22   ? ?  ?  ? ?  ? ? ? ? ? ? ? ? Plan - 03/26/22 1555   ? ? Clinical Impression Statement Continued to progress pt's strengthening with tband. Pt with full elbow ROM. Pt reports left elbow sensitivity right on olecranon process -- discussed desensitization techniques.   ? Personal Factors and Comorbidities Comorbidity 3+;Time since onset of injury/illness/exacerbation   ? Examination-Activity Limitations Lift;Carry   ? Examination-Participation Restrictions Meal Prep;Cleaning;Shop   ? PT Treatment/Interventions Aquatic Therapy;Electrical Stimulation;Iontophoresis '4mg'$ /ml Dexamethasone;Moist Heat;Cryotherapy;Neuromuscular re-education;Therapeutic exercise;Therapeutic activities;Patient/family education;Manual techniques;Passive range of motion;Dry needling;Taping;Vasopneumatic Device   ? PT Next Visit Plan progress elbow and shoulder strength per protocol, update HEP   ? PT [place]   ? Consulted and Agree with Plan of Care Patient   ? ?  ?  ? ?  ? ? ?Patient will benefit from skilled therapeutic intervention in order to improve the following deficits and impairments:  Pain, Impaired  UE functional use, Decreased strength, Decreased activity tolerance, Decreased range of motion, Decreased scar mobility ? ?Visit Diagnosis: ?Pain in left arm ? ?Other symptoms and signs involving the musculoskeletal system ? ?Muscle weakness (generalized) ? ? ? ? ?Problem List ?Patient Active Problem List  ? Diagnosis Date Noted  ?  Triceps tendon rupture, left, initial encounter 02/07/2022  ? Fall 12/31/2021  ? Contusion of left arm 12/31/2021  ? Acute left-sided low back pain without sciatica 12/31/2021  ? Neck pain 12/31/2021  ? Encounter for smoking cessation counseling 11/15/2021  ? Chronic obstructive pulmonary disease ([place]) 02/26/2021  ? Hyperlipidemia associated with type 2 diabetes mellitus ([place]) 11/05/2020  ? Non-restorative sleep 11/05/2020  ? Snoring 11/05/2020  ? Anterolisthesis 07/30/2020  ? Acute stress reaction 09/12/2018  ? Current smoker 06/08/2018  ? Diabetes mellitus, type II, insulin dependent ([place]) 06/08/2018  ? Claustrophobia 06/08/2018  ? Primary insomnia 06/08/2018  ? Hot flashes 06/08/2018  ? Abnormal weight gain 06/08/2018  ? Lower leg edema 11/10/2017  ? Insomnia due to other mental disorder 10/27/2017  ? History of substance abuse ([place]) 10/27/2017  ? GAD (generalized anxiety disorder) 10/27/2017  ? Class 3 severe obesity due to excess calories with serious comorbidity and body mass index (BMI) of 40.0 to 44.9 in adult [facility]) 08/31/2017  ? Cerumen impaction 01/24/2015  ? Hyperlipidemia 01/24/2015  ? Gastroesophageal reflux disease with esophagitis 01/10/2015  ? Foot callus 10/03/2014  ? PTSD (post-traumatic stress disorder) 09/26/2014  ? Depression 05/19/2014  ? Anxiety 05/19/2014  ? Chronic bilateral low back pain with right-sided sciatica 05/19/2014  ? DDD (degenerative disc disease), lumbar 05/19/2014  ? Neuropathy 02/05/2014  ? IT band syndrome 02/05/2014  ? Sinus tachycardia 02/05/2014  ? Memory changes 02/05/2014  ? Word finding difficulty 02/05/2014  ? Cervical cancer ([place])  02/05/2014  ? Type II diabetes mellitus, uncontrolled 02/03/2014  ? Unspecified asthma(493.90) 02/03/2014  ? Essential hypertension, benign 02/03/2014  ? Pulmonary emphysema ([place]) 07/26/2012  ? ? ?Dahmir Epperly April Ma L Elian [place]oster,

## 2022-03-27 MED ORDER — NYSTATIN 100000 UNIT/GM EX POWD: 1.0000 "application " | Freq: Three times a day (TID) | CUTANEOUS | 2 refills | Status: DC

## 2022-04-02 ENCOUNTER — Ambulatory Visit: Payer: 59 | Attending: Physician Assistant | Admitting: Physical Therapy

## 2022-04-02 DIAGNOSIS — M6281 Muscle weakness (generalized): Secondary | ICD-10-CM | POA: Insufficient documentation

## 2022-04-02 DIAGNOSIS — M79602 Pain in left arm: Secondary | ICD-10-CM | POA: Diagnosis not present

## 2022-04-02 DIAGNOSIS — R29898 Other symptoms and signs involving the musculoskeletal system: Secondary | ICD-10-CM | POA: Insufficient documentation

## 2022-04-02 NOTE — Therapy (Signed)
?[facility] ?[address] ?[place], [place], [address] ?Phone: [phone]   Fax:  [phone] ? ?Physical Therapy Treatment ? ?Patient Details  ?Name: Kathleen Le ?MRN: [id number] ?Date of Birth: [birth year] ?Referring Provider (PT): Dwana Melena ? ? ?Encounter Date: 04/02/2022 ? ? PT End of Session - 04/02/22 1605   ? ? Visit Number 5   ? Number of Visits 12   ? Date for PT Re-Evaluation 04/16/22   ? Authorization Type Aetna   ? PT Start Time 4193   ? PT Stop Time 7902   ? PT Time Calculation (min) 40 min   ? Activity Tolerance Patient tolerated treatment well   ? Behavior During Therapy [facility] for tasks assessed/performed   ? ?  ?  ? ?  ? ? ?Past Medical History:  ?Diagnosis Date  ? Anxiety   ? Arthritis   ? Asthma   ? Cancer [facility])   ? COPD (chronic obstructive pulmonary disease) ([place])   ? Degenerative disc disease, lumbar   ? Diabetes mellitus without complication ([place])   ? Dyspnea   ? with exertion due to COPD  ? GERD (gastroesophageal reflux disease)   ? History of alcohol abuse   ? 20-30 years ago, patient was going through divorce but does not currently drink any alcohol  ? History of marijuana use   ? "its been a long time since I last smoked because of my COPD"  ? History of ovarian cancer 1984  ? Hypertension   ? Pneumonia   ? Substance abuse ([place])   ? patient denies, states she has only ever used marijuana  ? ? ?Past Surgical History:  ?Procedure Laterality Date  ? DILATION AND CURETTAGE OF UTERUS    ? ECTOPIC PREGNANCY SURGERY    ? patient stated she had to have tube removed  ? TRICEPS TENDON REPAIR Left 02/07/2022  ? Procedure: left triceps repair;  Surgeon: Leandrew Koyanagi, MD;  Location: [place];  Service: Orthopedics;  Laterality: Left;  ? ? ?There were no vitals filed for this visit. ? ? Subjective Assessment - 04/02/22 1609   ? ? Subjective Pt reports some increased tenderness on her L elbow when she sets it down or if something touches it. Feels  that it's a little puffier.   ? Limitations Lifting;House hold activities   ? Patient Stated Goals improve mobility and strength   ? Currently in Pain? No/denies   ? ?  ?  ? ?  ? ? ? ? ? [facility] PT Assessment - 04/02/22 0001   ? ?  ? Assessment  ? Medical Diagnosis Lt triceps tendon rupture   ? Referring Provider (PT) Dwana Melena   ? Onset Date/Surgical Date 02/07/22   ? ?  ?  ? ?  ? ? ? ? ? ? ? ? ? ? ? ? ? ? ? ? [place] Adult PT Treatment/Exercise - 04/02/22 0001   ? ?  ? Elbow Exercises  ? Elbow Flexion Strengthening;Both;20 reps   ? Bar Weights/Barbell (Elbow Flexion) 2 lbs   ? Elbow Flexion Limitations supinated and then in neutral   ? Elbow Extension Strengthening;20 reps   ? Bar Weights/Barbell (Elbow Extension) --   ? Elbow Extension Limitations seated with arm by pt's side working on end range tricep   ? Other elbow exercises wrist flex/ext with 2 lbs 2x10   ? Other elbow exercises red therabar "U" and "n" 2x10   ?  ? Shoulder  Exercises: Seated  ? Extension Strengthening;Right;Left;Theraband   ? Theraband Level (Shoulder Extension) Level 1 ([place])   ? Flexion 20 reps   ? Flexion Weight (lbs) 2   ? Flexion Limitations scaption x20   ? ABduction Weight (lbs) 2   ?  ? Shoulder Exercises: ROM/Strengthening  ? UBE (Upper Arm Bike) L2, 2 min forward, 2 min back   ?  ? Shoulder Exercises: Stretch  ? Other Shoulder Stretches bicep stretch against doorframe 2x30 sec   ?  ? Manual Therapy  ? Manual Therapy Taping   ? Kinesiotex Edema   ?  ? Kinesiotix  ? Edema "I" strip along tricep   ? ?  ?  ? ?  ? ? ? ? ? ? ? ? ? ? ? ? ? ? ? PT Long Term Goals - 03/05/22 1431   ? ?  ? PT LONG TERM GOAL #1  ? Title Pt will be independent with HEP   ? Time 6   ? Period Weeks   ? Status New   ? Target Date 04/16/22   ?  ? PT LONG TERM GOAL #2  ? Title Pt will improve Lt elbow extension to 0 to improve ease with dressing   ? Time 6   ? Period Weeks   ? Status New   ? Target Date 04/16/22   ?  ? PT LONG TERM GOAL #3  ? Title Pt will  improve Lt UE strength to 4/5 to improve ability to complete IADLs   ? Time 6   ? Period Weeks   ? Status New   ? Target Date 04/16/22   ?  ? PT LONG TERM GOAL #4  ? Title Pt will improve FOTO to >= 63 to demo improved functional mobility   ? Time 6   ? Period Weeks   ? Status New   ? Target Date 04/16/22   ? ?  ?  ? ?  ? ? ? ? ? ? ? ? Plan - 04/02/22 1650   ? ? Clinical Impression Statement Pt able to demo full elbow ROM with shoulder/arm in front of her. Decreased elbow ext ROM when shoulder is extended back. Worked on improving end range ROM this session. Pt with increased "boggy" feel, redness and edema around her L olecranon process. Tender to the touch. Pt with no pain when performing any active movement. Provided taping to try and address. Discussed ice and elevation. Unsure if this is due to initiating light strengthening in the past week aggravating her tendon.   ? Personal Factors and Comorbidities Comorbidity 3+;Time since onset of injury/illness/exacerbation   ? Examination-Activity Limitations Lift;Carry   ? Examination-Participation Restrictions Meal Prep;Cleaning;Shop   ? PT Treatment/Interventions Aquatic Therapy;Electrical Stimulation;Iontophoresis '4mg'$ /ml Dexamethasone;Moist Heat;Cryotherapy;Neuromuscular re-education;Therapeutic exercise;Therapeutic activities;Patient/family education;Manual techniques;Passive range of motion;Dry needling;Taping;Vasopneumatic Device   ? PT Next Visit Plan progress elbow and shoulder strength per protocol, update HEP   ? PT [place]   ? Consulted and Agree with Plan of Care Patient   ? ?  ?  ? ?  ? ? ?Patient will benefit from skilled therapeutic intervention in order to improve the following deficits and impairments:  Pain, Impaired UE functional use, Decreased strength, Decreased activity tolerance, Decreased range of motion, Decreased scar mobility ? ?Visit Diagnosis: ?Pain in left arm ? ?Other symptoms and signs involving the musculoskeletal  system ? ?Muscle weakness (generalized) ? ? ? ? ?Problem List ?Patient Active Problem List  ?  Diagnosis Date Noted  ? Triceps tendon rupture, left, initial encounter 02/07/2022  ? Fall 12/31/2021  ? Contusion of left arm 12/31/2021  ? Acute left-sided low back pain without sciatica 12/31/2021  ? Neck pain 12/31/2021  ? Encounter for smoking cessation counseling 11/15/2021  ? Chronic obstructive pulmonary disease ([place]) 02/26/2021  ? Hyperlipidemia associated with type 2 diabetes mellitus ([place]) 11/05/2020  ? Non-restorative sleep 11/05/2020  ? Snoring 11/05/2020  ? Anterolisthesis 07/30/2020  ? Acute stress reaction 09/12/2018  ? Current smoker 06/08/2018  ? Diabetes mellitus, type II, insulin dependent ([place]) 06/08/2018  ? Claustrophobia 06/08/2018  ? Primary insomnia 06/08/2018  ? Hot flashes 06/08/2018  ? Abnormal weight gain 06/08/2018  ? Lower leg edema 11/10/2017  ? Insomnia due to other mental disorder 10/27/2017  ? History of substance abuse ([place]) 10/27/2017  ? GAD (generalized anxiety disorder) 10/27/2017  ? Class 3 severe obesity due to excess calories with serious comorbidity and body mass index (BMI) of 40.0 to 44.9 in adult [facility]) 08/31/2017  ? Cerumen impaction 01/24/2015  ? Hyperlipidemia 01/24/2015  ? Gastroesophageal reflux disease with esophagitis 01/10/2015  ? Foot callus 10/03/2014  ? PTSD (post-traumatic stress disorder) 09/26/2014  ? Depression 05/19/2014  ? Anxiety 05/19/2014  ? Chronic bilateral low back pain with right-sided sciatica 05/19/2014  ? DDD (degenerative disc disease), lumbar 05/19/2014  ? Neuropathy 02/05/2014  ? IT band syndrome 02/05/2014  ? Sinus tachycardia 02/05/2014  ? Memory changes 02/05/2014  ? Word finding difficulty 02/05/2014  ? Cervical cancer ([place]) 02/05/2014  ? Type II diabetes mellitus, uncontrolled 02/03/2014  ? Unspecified asthma(493.90) 02/03/2014  ? Essential hypertension, benign 02/03/2014  ? Pulmonary emphysema ([place]) 07/26/2012  ? ? ?Patrisha Hausmann April Gordy Levan,  PT, DPT ?04/02/2022, 4:54 PM ? ? ?[facility] ?[address] ?[place], [place], [address] ?Phone: [phone]   Fax:  [phone] ? ?Name: Lulu Hirschmann ?MRN

## 2022-04-04 ENCOUNTER — Encounter: Payer: Self-pay | Admitting: Physical Therapy

## 2022-04-09 ENCOUNTER — Ambulatory Visit: Payer: 59 | Admitting: Physical Therapy

## 2022-04-09 DIAGNOSIS — M6281 Muscle weakness (generalized): Secondary | ICD-10-CM

## 2022-04-09 DIAGNOSIS — R29898 Other symptoms and signs involving the musculoskeletal system: Secondary | ICD-10-CM

## 2022-04-09 DIAGNOSIS — M79602 Pain in left arm: Secondary | ICD-10-CM | POA: Diagnosis not present

## 2022-04-09 NOTE — Therapy (Signed)
[facility] ?[address] ?[place], [place], [address] ?Phone: [phone]   Fax:  [phone] ? ?Physical Therapy Treatment ? ?Patient Details  ?Name: Kathleen Le ?MRN: [id number] ?Date of Birth: Sep 14, 1961 ?Referring Provider (PT): Dwana Melena ? ? ?Encounter Date: 04/09/2022 ? ? PT End of Session - 04/09/22 1556   ? ? Visit Number 6   ? Number of Visits 12   ? Date for PT Re-Evaluation 04/16/22   ? Authorization Type Aetna   ? PT Start Time 1556   ? PT Stop Time 1640   ? PT Time Calculation (min) 44 min   ? Activity Tolerance Patient tolerated treatment well   ? Behavior During Therapy [facility] for tasks assessed/performed   ? ?  ?  ? ?  ? ? ?Past Medical History:  ?Diagnosis Date  ? Anxiety   ? Arthritis   ? Asthma   ? Cancer [facility])   ? COPD (chronic obstructive pulmonary disease) ([place])   ? Degenerative disc disease, lumbar   ? Diabetes mellitus without complication ([place])   ? Dyspnea   ? with exertion due to COPD  ? GERD (gastroesophageal reflux disease)   ? History of alcohol abuse   ? 20-30 years ago, patient was going through divorce but does not currently drink any alcohol  ? History of marijuana use   ? "its been a long time since I last smoked because of my COPD"  ? History of ovarian cancer 1984  ? Hypertension   ? Pneumonia   ? Substance abuse ([place])   ? patient denies, states she has only ever used marijuana  ? ? ?Past Surgical History:  ?Procedure Laterality Date  ? DILATION AND CURETTAGE OF UTERUS    ? ECTOPIC PREGNANCY SURGERY    ? patient stated she had to have tube removed  ? TRICEPS TENDON REPAIR Left 02/07/2022  ? Procedure: left triceps repair;  Surgeon: Leandrew Koyanagi, MD;  Location: [place];  Service: Orthopedics;  Laterality: Left;  ? ? ?There were no vitals filed for this visit. ? ? Subjective Assessment - 04/09/22 1557   ? ? Subjective Pt states she's done a few exercises every day but not the whole regimen. Notes improved puffiness with the  tape.   ? Limitations Lifting;House hold activities   ? Patient Stated Goals improve mobility and strength   ? Currently in Pain? No/denies   ? ?  ?  ? ?  ? ? ? ? ? [facility] PT Assessment - 04/09/22 0001   ? ?  ? Assessment  ? Medical Diagnosis Lt triceps tendon rupture   ? Referring Provider (PT) Dwana Melena   ? Onset Date/Surgical Date 02/07/22   ? ?  ?  ? ?  ? ? ? ? ? ? ? ? ? ? ? ? ? ? ? ? [place] Adult PT Treatment/Exercise - 04/09/22 0001   ? ?  ? Shoulder Exercises: Seated  ? Extension Strengthening;Right;Left;Theraband;20 reps   cues to keep L elbow extended  ? Theraband Level (Shoulder Extension) Level 1 (Yellow)   ? Horizontal ABduction Strengthening;Right;Left;20 reps;Theraband   ? Theraband Level (Shoulder Horizontal ABduction) Level 1 (Yellow)   ? External Rotation Strengthening;Both;20 reps;Theraband   ? Theraband Level (Shoulder External Rotation) Level 1 (Yellow)   ? Internal Rotation Strengthening;Both;20 reps;Theraband   ? Theraband Level (Shoulder Internal Rotation) Level 1 (Yellow)   ? Diagonals Strengthening;Left;Right;20 reps;Theraband   ? Theraband Level (Shoulder Diagonals) Level 1 (Yellow)   ?  Other Seated Exercises end range tricep extension arm by side 2x10 AAROM with manual/door assist; end range tricep extension with shoulder elevation/wall slides 2x10;   ? Other Seated Exercises lat pull down yellow tband 2x10; overhead press 2# 2x10   ?  ? Shoulder Exercises: ROM/Strengthening  ? UBE (Upper Arm Bike) L2, 2 min forward, 2 min back   ? Wall Pushups 20 reps   ? Other ROM/Strengthening Exercises Wall plank 2x15 sec   ?  ? Manual Therapy  ? Passive ROM Gentle PROM and grade II mob for elbow extension   ?  ? Kinesiotix  ? Edema "I" strip along tricep   ? ?  ?  ? ?  ? ? ? ? ? ? ? ? ? ? ? ? ? ? ? PT Long Term Goals - 03/05/22 1431   ? ?  ? PT LONG TERM GOAL #1  ? Title Pt will be independent with HEP   ? Time 6   ? Period Weeks   ? Status New   ? Target Date 04/16/22   ?  ? PT LONG TERM GOAL #2  ?  Title Pt will improve Lt elbow extension to 0 to improve ease with dressing   ? Time 6   ? Period Weeks   ? Status New   ? Target Date 04/16/22   ?  ? PT LONG TERM GOAL #3  ? Title Pt will improve Lt UE strength to 4/5 to improve ability to complete IADLs   ? Time 6   ? Period Weeks   ? Status New   ? Target Date 04/16/22   ?  ? PT LONG TERM GOAL #4  ? Title Pt will improve FOTO to >= 63 to demo improved functional mobility   ? Time 6   ? Period Weeks   ? Status New   ? Target Date 04/16/22   ? ?  ?  ? ?  ? ? ? ? ? ? ? ? Plan - 04/09/22 1647   ? ? Clinical Impression Statement Continued to work on end range triceps. Continued to provide taping since this helped with improving edema. Progressed light strengthening with yellow tband.   ? Personal Factors and Comorbidities Comorbidity 3+;Time since onset of injury/illness/exacerbation   ? Examination-Activity Limitations Lift;Carry   ? Examination-Participation Restrictions Meal Prep;Cleaning;Shop   ? PT Treatment/Interventions Aquatic Therapy;Electrical Stimulation;Iontophoresis '4mg'$ /ml Dexamethasone;Moist Heat;Cryotherapy;Neuromuscular re-education;Therapeutic exercise;Therapeutic activities;Patient/family education;Manual techniques;Passive range of motion;Dry needling;Taping;Vasopneumatic Device   ? PT Next Visit Plan progress elbow and shoulder strength per protocol, update HEP   ? PT [place]   ? Consulted and Agree with Plan of Care Patient   ? ?  ?  ? ?  ? ? ?Patient will benefit from skilled therapeutic intervention in order to improve the following deficits and impairments:  Pain, Impaired UE functional use, Decreased strength, Decreased activity tolerance, Decreased range of motion, Decreased scar mobility ? ?Visit Diagnosis: ?Pain in left arm ? ?Other symptoms and signs involving the musculoskeletal system ? ?Muscle weakness (generalized) ? ? ? ? ?Problem List ?Patient Active Problem List  ? Diagnosis Date Noted  ? Triceps tendon rupture,  left, initial encounter 02/07/2022  ? Fall 12/31/2021  ? Contusion of left arm 12/31/2021  ? Acute left-sided low back pain without sciatica 12/31/2021  ? Neck pain 12/31/2021  ? Encounter for smoking cessation counseling 11/15/2021  ? Chronic obstructive pulmonary disease ([place]) 02/26/2021  ? Hyperlipidemia associated with type 2  diabetes mellitus ([place]) 11/05/2020  ? Non-restorative sleep 11/05/2020  ? Snoring 11/05/2020  ? Anterolisthesis 07/30/2020  ? Acute stress reaction 09/12/2018  ? Current smoker 06/08/2018  ? Diabetes mellitus, type II, insulin dependent ([place]) 06/08/2018  ? Claustrophobia 06/08/2018  ? Primary insomnia 06/08/2018  ? Hot flashes 06/08/2018  ? Abnormal weight gain 06/08/2018  ? Lower leg edema 11/10/2017  ? Insomnia due to other mental disorder 10/27/2017  ? History of substance abuse ([place]) 10/27/2017  ? GAD (generalized anxiety disorder) 10/27/2017  ? Class 3 severe obesity due to excess calories with serious comorbidity and body mass index (BMI) of 40.0 to 44.9 in adult [facility]) 08/31/2017  ? Cerumen impaction 01/24/2015  ? Hyperlipidemia 01/24/2015  ? Gastroesophageal reflux disease with esophagitis 01/10/2015  ? Foot callus 10/03/2014  ? PTSD (post-traumatic stress disorder) 09/26/2014  ? Depression 05/19/2014  ? Anxiety 05/19/2014  ? Chronic bilateral low back pain with right-sided sciatica 05/19/2014  ? DDD (degenerative disc disease), lumbar 05/19/2014  ? Neuropathy 02/05/2014  ? IT band syndrome 02/05/2014  ? Sinus tachycardia 02/05/2014  ? Memory changes 02/05/2014  ? Word finding difficulty 02/05/2014  ? Cervical cancer ([place]) 02/05/2014  ? Type II diabetes mellitus, uncontrolled 02/03/2014  ? Unspecified asthma(493.90) 02/03/2014  ? Essential hypertension, benign 02/03/2014  ? Pulmonary emphysema ([place]) 07/26/2012  ? ? ?Tyson Masin April Gordy Levan, PT, DPT ?04/09/2022, 4:51 PM ? ?[place] ?[address] ?[place], [place],  [address] ?Phone: [phone]   Fax:  [phone] ? ?Name: Geralynn Capri ?MRN: [id number] ?Date of Birth: 09-15-1961 ? ? ? ?

## 2022-04-14 ENCOUNTER — Other Ambulatory Visit: Payer: Self-pay | Admitting: Physician Assistant

## 2022-04-16 ENCOUNTER — Ambulatory Visit: Payer: 59 | Admitting: Physical Therapy

## 2022-04-16 DIAGNOSIS — R29898 Other symptoms and signs involving the musculoskeletal system: Secondary | ICD-10-CM

## 2022-04-16 DIAGNOSIS — M79602 Pain in left arm: Secondary | ICD-10-CM | POA: Diagnosis not present

## 2022-04-16 DIAGNOSIS — M6281 Muscle weakness (generalized): Secondary | ICD-10-CM

## 2022-04-16 NOTE — Therapy (Addendum)
[facility] ?[facility] ?[address] ?[place], [place], [address] ?Phone: [phone]   Fax:  [phone] ? ?Physical Therapy Treatment and Re-Cert  ?Discharge Summary ? ?PHYSICAL THERAPY DISCHARGE SUMMARY ? ?Visits from Start of Care: 7 ? ?Current functional level related to goals / functional outcomes: ?See progress note ?  ?Remaining deficits: ?Unknown  ?  ?Education / Equipment: ?HEP  ? ?Patient agrees to discharge. Patient goals were partially met. Patient is being discharged due to not returning since the last visit. ? ?Celyn P. Helene Kelp PT, MPH ?05/12/22 12:48 PM ? ?Patient Details  ?Name: Kathleen Le ?MRN: [id number] ?Date of Birth: 1961-11-18 ?Referring Provider (PT): Dwana Melena ? ? ?Encounter Date: 04/16/2022 ? ? PT End of Session - 04/16/22 1519   ? ? Visit Number 7   ? Number of Visits 12   ? Date for PT Re-Evaluation 04/16/22   ? Authorization Type Aetna   ? PT Start Time 1519   ? PT Stop Time 1600   ? PT Time Calculation (min) 41 min   ? Activity Tolerance Patient tolerated treatment well   ? Behavior During Therapy [facility] for tasks assessed/performed   ? ?  ?  ? ?  ? ? ?Past Medical History:  ?Diagnosis Date  ? Anxiety   ? Arthritis   ? Asthma   ? Cancer [facility])   ? COPD (chronic obstructive pulmonary disease) ([place])   ? Degenerative disc disease, lumbar   ? Diabetes mellitus without complication ([place])   ? Dyspnea   ? with exertion due to COPD  ? GERD (gastroesophageal reflux disease)   ? History of alcohol abuse   ? 20-30 years ago, patient was going through divorce but does not currently drink any alcohol  ? History of marijuana use   ? "its been a long time since I last smoked because of my COPD"  ? History of ovarian cancer 1984  ? Hypertension   ? Pneumonia   ? Substance abuse ([place])   ? patient denies, states she has only ever used marijuana  ? ? ?Past Surgical History:  ?Procedure Laterality Date  ? DILATION AND CURETTAGE OF UTERUS    ? ECTOPIC PREGNANCY  SURGERY    ? patient stated she had to have tube removed  ? TRICEPS TENDON REPAIR Left 02/07/2022  ? Procedure: left triceps repair;  Surgeon: Leandrew Koyanagi, MD;  Location: [place];  Service: Orthopedics;  Laterality: Left;  ? ? ?There were no vitals filed for this visit. ? ? Subjective Assessment - 04/16/22 1533   ? ? Subjective Pt states that her sciatica has worsened. Swelling in elbow has not decreased. Remains "squishy".   ? Limitations Lifting;House hold activities   ? Patient Stated Goals improve mobility and strength   ? Currently in Pain? No/denies   ? ?  ?  ? ?  ? ? ? ? ? [facility] PT Assessment - 04/16/22 0001   ? ?  ? Precautions  ? Precaution Comments 10 wks on 4/21   ? ?  ?  ? ?  ? ? ? ? ? ? ? ? ? ? ? ? ? ? ? ? [facility] Adult PT Treatment/Exercise - 04/16/22 0001   ? ?  ? Elbow Exercises  ? Elbow Flexion Strengthening;Both;20 reps   ? Elbow Flexion Limitations supinated using red tband   ? Elbow Extension Strengthening;20 reps   ? Elbow Extension Limitations supine, tricep setting at end range with arm by side; "  skull" crushers 2x10   ? Other elbow exercises brachioradialis flex AROM 2x10 (notes "stinging" in elbow while flexed), pronated elbow flexion 2x10   ? Other elbow exercises red therabar "U" and "n" 2x10   ?  ? Shoulder Exercises: Seated  ? Horizontal ABduction Strengthening;Right;Left;20 reps;Theraband   ? Theraband Level (Shoulder Horizontal ABduction) Level 2 (Red)   ? External Rotation Strengthening;Both;20 reps;Theraband   ? Theraband Level (Shoulder External Rotation) Level 2 (Red)   ? Internal Rotation Strengthening;Both;20 reps;Theraband   ? Theraband Level (Shoulder Internal Rotation) Level 2 (Red)   ? Diagonals Strengthening;Left;Right;20 reps;Theraband   ? Theraband Level (Shoulder Diagonals) Level 2 (Red)   ? Other Seated Exercises serratus punch red tband 2x10   ?  ? Manual Therapy  ? Passive ROM Gentle PROM and grade II mob for elbow extension   ? ?  ?  ? ?  ? ? ? ? ? ? ? ? ? ?Prior  goals: ? ? PT Long Term Goals - 04/16/22 1649   ? ?  ? PT LONG TERM GOAL #1  ? Title Pt will be independent with HEP   ? Time 6   ? Period Weeks   ? Status Achieved   ? Target Date 04/16/22   ?  ? PT LONG TERM GOAL #2  ? Title Pt will improve Lt elbow extension to 0 to improve ease with dressing   ? Baseline PROM 0 deg ext; AROM 5 deg from ext   ? Time 6   ? Period Weeks   ? Status Partially Met   ? Target Date 04/16/22   ?  ? PT LONG TERM GOAL #3  ? Title Pt will improve Lt UE strength to 4/5 to improve ability to complete IADLs   ? Time 6   ? Period Weeks   ? Status Partially Met   ? Target Date 04/16/22   ?  ? PT LONG TERM GOAL #4  ? Title Pt will improve FOTO to >= 63 to demo improved functional mobility   ? Baseline 60   ? Time 6   ? Period Weeks   ? Status Partially Met   ? Target Date 04/16/22   ? ?  ?  ? ? ? ?Revised goals:  ? ? PT Long Term Goals - 04/16/22 1649   ? ?  ? PT LONG TERM GOAL #1  ? Title Pt will be independent with HEP   ? Time 6   ? Period Weeks   ? Status Achieved   ? Target Date 04/16/22   ?  ? PT LONG TERM GOAL #2  ? Title Pt will improve Lt elbow extension to 0 to improve ease with dressing   ? Baseline PROM 0 deg ext; AROM 5 deg from ext   ? Time 4   ? Period Weeks   ? Status Revised   ? Target Date 05/14/22   ?  ? PT LONG TERM GOAL #3  ? Title Pt will improve Lt UE strength to 4/5 to improve ability to complete IADLs   ? Time 4   ? Period Weeks   ? Status On-going   ? Target Date 05/14/22   ?  ? PT LONG TERM GOAL #4  ? Title Pt will improve FOTO to >= 63 to demo improved functional mobility   ? Baseline 60   ? Time 4   ? Period Weeks   ? Status Revised   ? Target Date 05/14/22   ? ?  ?  ? ?  ? ? ? ? ? ? ? ?  Plan - 04/16/22 1547   ? ? Clinical Impression Statement Continued edema noted -- will benefit from earlier follow up with Dr. Erlinda Hong. Improving end range tricep strength. Progressed shoulder strengthening to red tband with good tolerance. She has been progressing well towards her LTGs.  She would benefit from a few more sessions to fully establish a strengthening regimen and meet all of her goals.   ? Personal Factors and Comorbidities Comorbidity 3+;Time since onset of injury/illness/exacerbation   ? Examination-Activity Limitations Lift;Carry   ? Examination-Participation Restrictions Meal Prep;Cleaning;Shop   ? PT Treatment/Interventions Aquatic Therapy;Electrical Stimulation;Iontophoresis 48m/ml Dexamethasone;Moist Heat;Cryotherapy;Neuromuscular re-education;Therapeutic exercise;Therapeutic activities;Patient/family education;Manual techniques;Passive range of motion;Dry needling;Taping;Vasopneumatic Device   ? PT Next Visit Plan progress elbow and shoulder strength per protocol, update HEP   ? PT [facility]  ? Consulted and Agree with Plan of Care Patient   ? ?  ?  ? ?  ? ? ?Patient will benefit from skilled therapeutic intervention in order to improve the following deficits and impairments:  Pain, Impaired UE functional use, Decreased strength, Decreased activity tolerance, Decreased range of motion, Decreased scar mobility ? ?Visit Diagnosis: ?Pain in left arm ? ?Other symptoms and signs involving the musculoskeletal system ? ?Muscle weakness (generalized) ? ? ? ? ?Problem List ?Patient Active Problem List  ? Diagnosis Date Noted  ? Triceps tendon rupture, left, initial encounter 02/07/2022  ? Fall 12/31/2021  ? Contusion of left arm 12/31/2021  ? Acute left-sided low back pain without sciatica 12/31/2021  ? Neck pain 12/31/2021  ? Encounter for smoking cessation counseling 11/15/2021  ? Chronic obstructive pulmonary disease ([place] 02/26/2021  ? Hyperlipidemia associated with type 2 diabetes mellitus ([place] 11/05/2020  ? Non-restorative sleep 11/05/2020  ? Snoring 11/05/2020  ? Anterolisthesis 07/30/2020  ? Acute stress reaction 09/12/2018  ? Current smoker 06/08/2018  ? Diabetes mellitus, type II, insulin dependent ([place] 06/08/2018  ? Claustrophobia 06/08/2018  ? Primary  insomnia 06/08/2018  ? Hot flashes 06/08/2018  ? Abnormal weight gain 06/08/2018  ? Lower leg edema 11/10/2017  ? Insomnia due to other mental disorder 10/27/2017  ? History of substance abuse ([place] 10/27/2017  ? G

## 2022-04-18 ENCOUNTER — Encounter: Payer: Self-pay | Admitting: Physician Assistant

## 2022-04-21 MED ORDER — INSULIN NPH (HUMAN) (ISOPHANE) 100 UNIT/ML ~~LOC~~ SUSP: 40.0000 [IU] | Freq: Every day | SUBCUTANEOUS | 0 refills | Status: DC

## 2022-04-21 NOTE — Telephone Encounter (Signed)
Lynn scheduled patient for appt 05/06/22. AMUCK ?

## 2022-04-25 ENCOUNTER — Encounter: Payer: Self-pay | Admitting: Orthopaedic Surgery

## 2022-04-25 ENCOUNTER — Encounter: Payer: Self-pay | Admitting: Physician Assistant

## 2022-04-25 ENCOUNTER — Ambulatory Visit: Payer: 59 | Admitting: Orthopaedic Surgery

## 2022-04-25 DIAGNOSIS — S46312A Strain of muscle, fascia and tendon of triceps, left arm, initial encounter: Secondary | ICD-10-CM

## 2022-04-25 MED ORDER — CEPHALEXIN 500 MG PO CAPS: 500.0000 mg | ORAL_CAPSULE | Freq: Four times a day (QID) | ORAL | 0 refills | Status: DC

## 2022-04-25 MED ORDER — LIDOCAINE HCL 1 % IJ SOLN
3.0000 mL | INTRAMUSCULAR | Status: AC | PRN
  Administered 2022-04-25: 3 mL

## 2022-04-25 MED ORDER — BUPIVACAINE HCL 0.25 % IJ SOLN
3.0000 mL | INTRAMUSCULAR | Status: AC | PRN
  Administered 2022-04-25: 3 mL via INTRA_ARTICULAR

## 2022-04-25 NOTE — Progress Notes (Signed)
? ?  Procedure Note ? ?Patient: Kathleen Le             ?Date of Birth: 1961/07/14           ?MRN: [id number]             ?Visit Date: 04/25/2022 ? ?Procedures: ?Visit Diagnoses:  ?1. Triceps tendon rupture, left, initial encounter   ? ? ?Medium Joint Inj: L elbow on 04/25/2022 9:24 AM ?Medications: 3 mL lidocaine 1 %; 3 mL bupivacaine 0.25 % ? ? ? ? ? ?

## 2022-04-25 NOTE — Progress Notes (Signed)
? ?Post-Op Visit Note ?  ?Patient: Kathleen Le           ?Date of Birth: [birth year]           ?MRN: [id number] ?Visit Date: 04/25/2022 ?PCP: Donella Stade, PA-C ? ? ?Assessment & Plan: ? ?Chief Complaint:  ?Chief Complaint  ?Patient presents with  ? Left Elbow - Pain, Routine Post Op, Follow-up  ? ?Visit Diagnoses:  ?1. Triceps tendon rupture, left, initial encounter   ? ? ?Plan: Patient is a pleasant 61 year old female who comes in today approximately 11 weeks status post left elbow triceps repair 02/07/2022.  She has been doing well in regards to the triceps repair.  She has been in physical therapy which was stopped last week due to intermittent clear drainage from a scab over the olecranon bursa.  Patient denies injuring this area.  No new bug bites or scrapes.  She denies any fevers or chills.  She does note that this area has drained 13 times since last week.  Examination of her left elbow reveals a fully healed surgical scar without complication.  Full range of motion of the elbow.  3 out of 5 strength with triceps testing.  She does have a small olecranon bursa with very minimal erythema.  Minimally tender.  There is a small scab to the tip of this.  No active drainage.  No apparent signs of cellulitis or infection.  Today, we aspirated approximately 7 cc serous fluid from the bursa and sent this off for cell count and culture.  Compression wrap applied.  I will go ahead and start her on Keflex rather than Bactrim as her renal function is slightly impaired.  She may continue with physical therapy.  She will follow-up with [place] in 2 weeks time for recheck and less her symptoms worsen for which she will give [place] a call. ? ?Follow-Up Instructions: Return in about 2 weeks (around 05/09/2022).  ? ?Orders:  ?No orders of the defined types were placed in this encounter. ? ?Meds ordered this encounter  ?Medications  ? cephALEXin (KEFLEX) 500 MG capsule  ?  Sig: Take 1 capsule (500 mg total) by mouth 4 (four) times  daily.  ?  Dispense:  40 capsule  ?  Refill:  0  ? ? ?Imaging: ?No new imaging ? ?PMFS History: ?Patient Active Problem List  ? Diagnosis Date Noted  ? Triceps tendon rupture, left, initial encounter 02/07/2022  ? Fall 12/31/2021  ? Contusion of left arm 12/31/2021  ? Acute left-sided low back pain without sciatica 12/31/2021  ? Neck pain 12/31/2021  ? Encounter for smoking cessation counseling 11/15/2021  ? Chronic obstructive pulmonary disease ([place]) 02/26/2021  ? Hyperlipidemia associated with type 2 diabetes mellitus ([place]) 11/05/2020  ? Non-restorative sleep 11/05/2020  ? Snoring 11/05/2020  ? Anterolisthesis 07/30/2020  ? Acute stress reaction 09/12/2018  ? Current smoker 06/08/2018  ? Diabetes mellitus, type II, insulin dependent ([place]) 06/08/2018  ? Claustrophobia 06/08/2018  ? Primary insomnia 06/08/2018  ? Hot flashes 06/08/2018  ? Abnormal weight gain 06/08/2018  ? Lower leg edema 11/10/2017  ? Insomnia due to other mental disorder 10/27/2017  ? History of substance abuse ([place]) 10/27/2017  ? GAD (generalized anxiety disorder) 10/27/2017  ? Class 3 severe obesity due to excess calories with serious comorbidity and body mass index (BMI) of 40.0 to 44.9 in adult [facility]) 08/31/2017  ? Cerumen impaction 01/24/2015  ? Hyperlipidemia 01/24/2015  ? Gastroesophageal reflux disease with esophagitis 01/10/2015  ?  Foot callus 10/03/2014  ? PTSD (post-traumatic stress disorder) 09/26/2014  ? Depression 05/19/2014  ? Anxiety 05/19/2014  ? Chronic bilateral low back pain with right-sided sciatica 05/19/2014  ? DDD (degenerative disc disease), lumbar 05/19/2014  ? Neuropathy 02/05/2014  ? IT band syndrome 02/05/2014  ? Sinus tachycardia 02/05/2014  ? Memory changes 02/05/2014  ? Word finding difficulty 02/05/2014  ? Cervical cancer ([place]) 02/05/2014  ? Type II diabetes mellitus, uncontrolled 02/03/2014  ? Unspecified asthma(493.90) 02/03/2014  ? Essential hypertension, benign 02/03/2014  ? Pulmonary emphysema ([place]) 07/26/2012   ? ?Past Medical History:  ?Diagnosis Date  ? Anxiety   ? Arthritis   ? Asthma   ? Cancer [facility])   ? COPD (chronic obstructive pulmonary disease) ([place])   ? Degenerative disc disease, lumbar   ? Diabetes mellitus without complication ([place])   ? Dyspnea   ? with exertion due to COPD  ? GERD (gastroesophageal reflux disease)   ? History of alcohol abuse   ? 20-30 years ago, patient was going through divorce but does not currently drink any alcohol  ? History of marijuana use   ? "its been a long time since I last smoked because of my COPD"  ? History of ovarian cancer 1984  ? Hypertension   ? Pneumonia   ? Substance abuse ([place])   ? patient denies, states she has only ever used marijuana  ?  ?Family History  ?Problem Relation Age of Onset  ? Cancer Paternal Grandmother   ? Diabetes Paternal Grandmother   ? Diabetes Paternal Grandfather   ? Heart attack Maternal Aunt   ? Cancer Maternal Uncle   ? Cancer Maternal Grandmother   ? Heart attack Mother   ? Cancer Mother   ? Stroke Mother   ? Diabetes Father   ? Heart attack Father   ? Diabetes Paternal Uncle   ? Diabetes Paternal Uncle   ?  ?Past Surgical History:  ?Procedure Laterality Date  ? DILATION AND CURETTAGE OF UTERUS    ? ECTOPIC PREGNANCY SURGERY    ? patient stated she had to have tube removed  ? TRICEPS TENDON REPAIR Left 02/07/2022  ? Procedure: left triceps repair;  Surgeon: Leandrew Koyanagi, MD;  Location: [place];  Service: Orthopedics;  Laterality: Left;  ? ?Social History  ? ?Occupational History  ? Not on file  ?Tobacco Use  ? Smoking status: Every Day  ?  Packs/day: 0.50  ?  Types: Cigarettes  ? Smokeless tobacco: Never  ?Vaping Use  ? Vaping Use: Never used  ?Substance and Sexual Activity  ? Alcohol use: Not Currently  ? Drug use: Not Currently  ?  Types: Marijuana  ?  Comment: no longer smokes marijuana due to COPD  ? Sexual activity: Yes  ? ? ? ?

## 2022-04-26 LAB — GRAM STAIN
MICRO NUMBER:: 13326546
SPECIMEN QUALITY:: ADEQUATE

## 2022-04-26 LAB — SYNOVIAL FLUID ANALYSIS, COMPLETE
Basophils, %: 0 %
Eosinophils-Synovial: 0 % (ref 0–2)
Lymphocytes-Synovial Fld: 25 % (ref 0–74)
Monocyte/Macrophage: 9 % (ref 0–69)
Neutrophil, Synovial: 66 % — ABNORMAL HIGH (ref 0–24)
Synoviocytes, %: 0 % (ref 0–15)
WBC, Synovial: 1578 cells/uL — ABNORMAL HIGH (ref <150)

## 2022-04-27 ENCOUNTER — Other Ambulatory Visit: Payer: Self-pay | Admitting: Physician Assistant

## 2022-04-27 MED ORDER — FLUCONAZOLE 150 MG PO TABS: 150.0000 mg | ORAL_TABLET | Freq: Every day | ORAL | 0 refills | Status: DC

## 2022-04-27 NOTE — Telephone Encounter (Signed)
Sent in

## 2022-05-02 ENCOUNTER — Ambulatory Visit: Payer: 59 | Admitting: Orthopaedic Surgery

## 2022-05-02 LAB — ANAEROBIC AND AEROBIC CULTURE
AER RESULT:: NO GROWTH
MICRO NUMBER:: 13326547
MICRO NUMBER:: 13326548
SPECIMEN QUALITY:: ADEQUATE
SPECIMEN QUALITY:: ADEQUATE

## 2022-05-06 ENCOUNTER — Encounter: Payer: Self-pay | Admitting: Physician Assistant

## 2022-05-06 ENCOUNTER — Other Ambulatory Visit: Payer: Self-pay | Admitting: Physician Assistant

## 2022-05-06 ENCOUNTER — Ambulatory Visit (INDEPENDENT_AMBULATORY_CARE_PROVIDER_SITE_OTHER): Payer: 59 | Admitting: Physician Assistant

## 2022-05-06 VITALS — BP 122/62 | HR 89 | Resp 18 | Ht 66.0 in | Wt 275.0 lb

## 2022-05-06 DIAGNOSIS — R300 Dysuria: Secondary | ICD-10-CM | POA: Diagnosis not present

## 2022-05-06 DIAGNOSIS — S41102S Unspecified open wound of left upper arm, sequela: Secondary | ICD-10-CM

## 2022-05-06 DIAGNOSIS — Z794 Long term (current) use of insulin: Secondary | ICD-10-CM

## 2022-05-06 DIAGNOSIS — I10 Essential (primary) hypertension: Secondary | ICD-10-CM | POA: Diagnosis not present

## 2022-05-06 DIAGNOSIS — Z13220 Encounter for screening for lipoid disorders: Secondary | ICD-10-CM | POA: Diagnosis not present

## 2022-05-06 DIAGNOSIS — E1165 Type 2 diabetes mellitus with hyperglycemia: Secondary | ICD-10-CM | POA: Diagnosis not present

## 2022-05-06 DIAGNOSIS — J432 Centrilobular emphysema: Secondary | ICD-10-CM

## 2022-05-06 DIAGNOSIS — Z6841 Body Mass Index (BMI) 40.0 and over, adult: Secondary | ICD-10-CM

## 2022-05-06 DIAGNOSIS — E119 Type 2 diabetes mellitus without complications: Secondary | ICD-10-CM | POA: Diagnosis not present

## 2022-05-06 LAB — POCT URINALYSIS DIP (CLINITEK)
Bilirubin, UA: NEGATIVE
Blood, UA: NEGATIVE
Glucose, UA: NEGATIVE mg/dL
Ketones, POC UA: NEGATIVE mg/dL
Leukocytes, UA: NEGATIVE
Nitrite, UA: NEGATIVE
POC PROTEIN,UA: NEGATIVE
Spec Grav, UA: 1.025 (ref 1.010–1.025)
Urobilinogen, UA: 0.2 E.U./dL
pH, UA: 6.5 (ref 5.0–8.0)

## 2022-05-06 LAB — POCT UA - MICROALBUMIN

## 2022-05-06 LAB — POCT GLYCOSYLATED HEMOGLOBIN (HGB A1C): Hemoglobin A1C: 7.3 % — AB (ref 4.0–5.6)

## 2022-05-06 MED ORDER — GLIPIZIDE 10 MG PO TABS: 10.0000 mg | ORAL_TABLET | Freq: Two times a day (BID) | ORAL | 1 refills | Status: DC

## 2022-05-06 MED ORDER — BLOOD GLUCOSE METER KIT: PACK | 0 refills | Status: DC

## 2022-05-06 MED ORDER — ATENOLOL 50 MG PO TABS: 50.0000 mg | ORAL_TABLET | Freq: Every day | ORAL | 3 refills | Status: DC

## 2022-05-06 MED ORDER — METFORMIN HCL 1000 MG PO TABS: 1000.0000 mg | ORAL_TABLET | Freq: Two times a day (BID) | ORAL | 1 refills | Status: DC

## 2022-05-06 MED ORDER — SITAGLIPTIN PHOSPHATE 100 MG PO TABS: 100.0000 mg | ORAL_TABLET | Freq: Every day | ORAL | 1 refills | Status: DC

## 2022-05-06 MED ORDER — AMLODIPINE BESYLATE 5 MG PO TABS: 5.0000 mg | ORAL_TABLET | Freq: Every day | ORAL | 1 refills | Status: DC

## 2022-05-06 MED ORDER — PIOGLITAZONE HCL 45 MG PO TABS: 45.0000 mg | ORAL_TABLET | Freq: Every day | ORAL | 1 refills | Status: DC

## 2022-05-06 NOTE — Progress Notes (Signed)
? ?Established Patient Office Visit ? ?Subjective   ?Patient ID: Kathleen Le, female    DOB: 1961-06-01  Age: 61 y.o. MRN: [id number] ? ?Chief Complaint  ?Patient presents with  ? Diabetes  ?  Follow up   ? Diabetes Eye Exam  ?  Patient will schedule appointment with [facility] [place]   ? Urinary Tract Infection  ?  Burning with urination and lower abdomen pain for 2 weeks.   ? ? ?HPI ?Pt is a 61 yo obese female with T2DM, HTN, HLD, COPD who presents to the clinic for medication follow up and refills.  ? ?She is not checking her sugars regularly because she is running out of supplies and they are staying if not covered by her insurance.  She is taking her glipizide and Actos.  Insurance would not approve any branded medications.  She denies any hypoglycemic events.  She is trying hard to eat a low sugar diet and stay active.  She does have an open wound of her left elbow where she had surgery and tricep repair.  It is very slow to heal.  She continues to see the orthopedic.  Patient denies any chest pain, palpitations headaches or vision changes.  Her COPD and breathing are well controlled.  She admits she has not stop smoking.  She is down to half a pack to a pack a day. ?Cutting back smoking to 1 pack ?.. ?Active Ambulatory Problems  ?  Diagnosis Date Noted  ? Type II diabetes mellitus, uncontrolled 02/03/2014  ? Unspecified asthma(493.90) 02/03/2014  ? Essential hypertension, benign 02/03/2014  ? Neuropathy 02/05/2014  ? IT band syndrome 02/05/2014  ? Sinus tachycardia 02/05/2014  ? Memory changes 02/05/2014  ? Word finding difficulty 02/05/2014  ? Cervical cancer ([place]) 02/05/2014  ? Depression 05/19/2014  ? Anxiety 05/19/2014  ? Chronic bilateral low back pain with right-sided sciatica 05/19/2014  ? DDD (degenerative disc disease), lumbar 05/19/2014  ? PTSD (post-traumatic stress disorder) 09/26/2014  ? Foot callus 10/03/2014  ? Gastroesophageal reflux disease with esophagitis 01/10/2015  ? Cerumen  impaction 01/24/2015  ? Hyperlipidemia 01/24/2015  ? Pulmonary emphysema ([place]) 07/26/2012  ? Class 3 severe obesity due to excess calories with serious comorbidity and body mass index (BMI) of 40.0 to 44.9 in adult [facility]) 08/31/2017  ? Insomnia due to other mental disorder 10/27/2017  ? History of substance abuse ([place]) 10/27/2017  ? GAD (generalized anxiety disorder) 10/27/2017  ? Lower leg edema 11/10/2017  ? Current smoker 06/08/2018  ? Diabetes mellitus, type II, insulin dependent ([place]) 06/08/2018  ? Claustrophobia 06/08/2018  ? Primary insomnia 06/08/2018  ? Hot flashes 06/08/2018  ? Abnormal weight gain 06/08/2018  ? Acute stress reaction 09/12/2018  ? Anterolisthesis 07/30/2020  ? Hyperlipidemia associated with type 2 diabetes mellitus ([place]) 11/05/2020  ? Non-restorative sleep 11/05/2020  ? Snoring 11/05/2020  ? Chronic obstructive pulmonary disease ([place]) 02/26/2021  ? Encounter for smoking cessation counseling 11/15/2021  ? Fall 12/31/2021  ? Contusion of left arm 12/31/2021  ? Acute left-sided low back pain without sciatica 12/31/2021  ? Neck pain 12/31/2021  ? Triceps tendon rupture, left, initial encounter 02/07/2022  ? Centrilobular emphysema ([place]) 05/09/2022  ? ?Resolved Ambulatory Problems  ?  Diagnosis Date Noted  ? Bruised rib 09/26/2014  ? Bruised ribs 09/26/2014  ? Wound of right leg 09/26/2014  ? Essential hypertension 11/10/2017  ? Former smoker 03/09/2018  ? ?Past Medical History:  ?Diagnosis Date  ? Arthritis   ?  Asthma   ? Cancer [facility])   ? COPD (chronic obstructive pulmonary disease) ([place])   ? Degenerative disc disease, lumbar   ? Diabetes mellitus without complication ([place])   ? Dyspnea   ? GERD (gastroesophageal reflux disease)   ? History of alcohol abuse   ? History of marijuana use   ? History of ovarian cancer 1984  ? Hypertension   ? Pneumonia   ? Substance abuse ([place])   ? ? ? ?ROS ?See HPI.  ?  ?Objective:  ?  ? ?BP 122/62   Pulse 89   Resp 18   Ht _0  (1.676 m)   Wt 275 lb (124.7  kg)   SpO2 95%   BMI 44.39 kg/m?  ?BP Readings from Last 3 Encounters:  ?05/06/22 122/62  ?03/04/22 (!) 152/76  ?02/07/22 (!) 115/58  ? ?Wt Readings from Last 3 Encounters:  ?05/06/22 275 lb (124.7 kg)  ?03/04/22 276 lb (125.2 kg)  ?02/07/22 270 lb (122.5 kg)  ? ? .. ?Results for orders placed or performed in visit on 05/06/22  ?POCT glycosylated hemoglobin (Hb A1C)  ?Result Value Ref Range  ? Hemoglobin A1C 7.3 (A) 4.0 - 5.6 %  ? HbA1c POC (<> result, manual entry)    ? HbA1c, POC (prediabetic range)    ? HbA1c, POC (controlled diabetic range)    ?POCT URINALYSIS DIP (CLINITEK)  ?Result Value Ref Range  ? Color, UA yellow yellow  ? Clarity, UA clear clear  ? Glucose, UA negative negative mg/dL  ? Bilirubin, UA negative negative  ? Ketones, POC UA negative negative mg/dL  ? Spec Grav, UA 1.025 1.010 - 1.025  ? Blood, UA negative negative  ? pH, UA 6.5 5.0 - 8.0  ? POC PROTEIN,UA negative negative, trace  ? Urobilinogen, UA 0.2 0.2 or 1.0 E.U./dL  ? Nitrite, UA Negative Negative  ? Leukocytes, UA Negative Negative  ?POCT UA - Microalbumin  ?Result Value Ref Range  ? Microalbumin Ur, POC 59m/L mg/L  ? Creatinine, POC 1047mDL mg/dL  ? Albumin/Creatinine Ratio, Urine, POC <3034m   ? ? ? ?Physical Exam ?Vitals reviewed.  ?Constitutional:   ?   Appearance: Normal appearance. She is obese.  ?HENT:  ?   Head: Normocephalic.  ?Cardiovascular:  ?   Rate and Rhythm: Normal rate and regular rhythm.  ?   Pulses: Normal pulses.  ?   Heart sounds: Murmur heard.  ?Pulmonary:  ?   Effort: Pulmonary effort is normal.  ?   Breath sounds: Normal breath sounds.  ?Musculoskeletal:  ?   Right lower leg: No edema.  ?   Left lower leg: No edema.  ?Neurological:  ?   General: No focal deficit present.  ?   Mental Status: She is alert and oriented to person, place, and time.  ?Psychiatric:     ?   Mood and Affect: Mood normal.  ? ? ? ? ? ?  ?Assessment & Plan:  ?..VicAdalynds seen today for diabetes, diabetes eye exam and urinary tract  infection. ? ?Diagnoses and all orders for this visit: ? ?Uncontrolled type 2 diabetes mellitus with hyperglycemia (HCC) ?-     POCT glycosylated hemoglobin (Hb A1C) ?-     POCT UA - Microalbumin ?-     pioglitazone (ACTOS) 45 MG tablet; Take 1 tablet (45 mg total) by mouth daily. ?-     sitaGLIPtin (JANUVIA) 100 MG tablet; Take 1 tablet (100 mg total) by mouth daily. ?-  blood glucose meter kit and supplies; Dispense based on patient and insurance preference. Use up to four times daily as directed. (FOR ICD-10 E10.9, E11.9). ? ?Dysuria ?-     POCT URINALYSIS DIP (CLINITEK) ? ?Diabetes mellitus, type II, insulin dependent (HCC) ?-     sitaGLIPtin (JANUVIA) 100 MG tablet; Take 1 tablet (100 mg total) by mouth daily. ?-     glipiZIDE (GLUCOTROL) 10 MG tablet; Take 1 tablet (10 mg total) by mouth 2 (two) times daily before a meal. ?-     metFORMIN (GLUCOPHAGE) 1000 MG tablet; Take 1 tablet (1,000 mg total) by mouth 2 (two) times daily with a meal. ? ?Essential hypertension, benign ?-     amLODipine (NORVASC) 5 MG tablet; Take 1 tablet (5 mg total) by mouth daily. ?-     atenolol (TENORMIN) 50 MG tablet; Take 1 tablet (50 mg total) by mouth daily. ? ?Centrilobular emphysema ([place]) ? ?Class 3 severe obesity due to excess calories with serious comorbidity and body mass index (BMI) of 40.0 to 44.9 in adult [facility]) ? ?Arm wound, left, sequela ? ? ?A1C not quite to goal but moving in the right direction ?Increased actos to 16m daily ?Will get meter approved by insurance to pharmacy ?Goal would be 90-130 in am ?Continue to work on diet ?BP great today continue norvasc/metoprolol ?Micro albumin is good ?On statin ?Need eye exam and states she will schedule ?Declines covid vaccine ?Pneumonia UTD ?Declines flu vaccine ?Follow upin 3 months ? ? ?UA negative for blood, leuks, nitrites.  ?Will culture ?Increase fluids ?Follow up as needed or if symptoms worsen.  ? ?Breathing is doing pretty well ?Continues to smoke but cutting  back ?Stay on same inhalers ? ?Return in about 3 months (around 08/06/2022).  ? ? ?JIran Planas PA-C ? ?

## 2022-05-07 LAB — COMPLETE METABOLIC PANEL WITHOUT GFR
AG Ratio: 1.5 (calc) (ref 1.0–2.5)
ALT: 17 U/L (ref 6–29)
AST: 17 U/L (ref 10–35)
Albumin: 3.9 g/dL (ref 3.6–5.1)
Alkaline phosphatase (APISO): 82 U/L (ref 37–153)
BUN: 18 mg/dL (ref 7–25)
CO2: 26 mmol/L (ref 20–32)
Calcium: 8.9 mg/dL (ref 8.6–10.4)
Chloride: 103 mmol/L (ref 98–110)
Creat: 0.83 mg/dL (ref 0.50–1.03)
Globulin: 2.6 g/dL (ref 1.9–3.7)
Glucose, Bld: 135 mg/dL — ABNORMAL HIGH (ref 65–99)
Potassium: 3.9 mmol/L (ref 3.5–5.3)
Sodium: 140 mmol/L (ref 135–146)
Total Bilirubin: 0.4 mg/dL (ref 0.2–1.2)
Total Protein: 6.5 g/dL (ref 6.1–8.1)
eGFR: 81 mL/min/1.73m2

## 2022-05-07 LAB — LIPID PANEL W/REFLEX DIRECT LDL
Cholesterol: 162 mg/dL (ref <200)
HDL: 55 mg/dL (ref >=50)
LDL Cholesterol (Calc): 83 mg/dL (calc)
Non-HDL Cholesterol (Calc): 107 mg/dL (calc) (ref <130)
Total CHOL/HDL Ratio: 2.9 (calc) (ref <5.0)
Triglycerides: 141 mg/dL (ref <150)

## 2022-05-07 LAB — CBC WITH DIFFERENTIAL/PLATELET
Absolute Monocytes: 534 cells/uL (ref 200–950)
Basophils Absolute: 29 cells/uL (ref 0–200)
Basophils Relative: 0.6 %
Eosinophils Absolute: 78 cells/uL (ref 15–500)
Eosinophils Relative: 1.6 %
HCT: 40.9 % (ref 35.0–45.0)
Hemoglobin: 13.3 g/dL (ref 11.7–15.5)
Lymphs Abs: 1142 cells/uL (ref 850–3900)
MCH: 29.8 pg (ref 27.0–33.0)
MCHC: 32.5 g/dL (ref 32.0–36.0)
MCV: 91.7 fL (ref 80.0–100.0)
MPV: 9.7 fL (ref 7.5–12.5)
Monocytes Relative: 10.9 %
Neutro Abs: 3116 cells/uL (ref 1500–7800)
Neutrophils Relative %: 63.6 %
Platelets: 266 10*3/uL (ref 140–400)
RBC: 4.46 10*6/uL (ref 3.80–5.10)
RDW: 13.3 % (ref 11.0–15.0)
Total Lymphocyte: 23.3 %
WBC: 4.9 10*3/uL (ref 3.8–10.8)

## 2022-05-07 LAB — TSH: TSH: 1.04 m[IU]/L (ref 0.40–4.50)

## 2022-05-08 ENCOUNTER — Ambulatory Visit: Payer: 59 | Admitting: Orthopaedic Surgery

## 2022-05-08 ENCOUNTER — Encounter: Payer: Self-pay | Admitting: Orthopaedic Surgery

## 2022-05-08 DIAGNOSIS — S46312A Strain of muscle, fascia and tendon of triceps, left arm, initial encounter: Secondary | ICD-10-CM

## 2022-05-08 NOTE — Progress Notes (Signed)
? ?Post-Op Visit Note ?  ?Patient: Francenia Le           ?Date of Birth: 1961-11-14           ?MRN: [id number] ?Visit Date: 05/08/2022 ?PCP: Donella Stade, PA-C ? ? ?Assessment & Plan: ? ?Chief Complaint:  ?Chief Complaint  ?Patient presents with  ? Left Elbow - Follow-up  ? ?Visit Diagnoses:  ?1. Triceps tendon rupture, left, initial encounter   ? ? ?Plan: Kathleen Le returns today for wound check of the left elbow.  Continues to have clear drainage from the olecranon bursa.  She has been taking the antibiotics as prescribed.  Denies any constitutional symptoms.  Denies any pain. ? ?Examination left elbow shows a fully healed curvilinear surgical scar.  The open wound is over the tip of the olecranon which is away from the surgical scar.  The fluid that is draining appears to be bursal fluid.  There is white gelatinous discharge that appears to be portions of the bursa. ? ?Since the bursa continues to drain and that there is an open wound I have recommended formal I&D in the operating room ASAP.  Patient stated that she is unavailable tomorrow but can do surgery this coming Monday which we will make arrangements for.  Questions encouraged and answered. ? ?Follow-Up Instructions: No follow-ups on file.  ? ?Orders:  ?No orders of the defined types were placed in this encounter. ? ?No orders of the defined types were placed in this encounter. ? ? ?Imaging: ?No results found. ? ?PMFS History: ?Patient Active Problem List  ? Diagnosis Date Noted  ? Triceps tendon rupture, left, initial encounter 02/07/2022  ? Fall 12/31/2021  ? Contusion of left arm 12/31/2021  ? Acute left-sided low back pain without sciatica 12/31/2021  ? Neck pain 12/31/2021  ? Encounter for smoking cessation counseling 11/15/2021  ? Chronic obstructive pulmonary disease ([place]) 02/26/2021  ? Hyperlipidemia associated with type 2 diabetes mellitus ([place]) 11/05/2020  ? Non-restorative sleep 11/05/2020  ? Snoring 11/05/2020  ? Anterolisthesis 07/30/2020  ?  Acute stress reaction 09/12/2018  ? Current smoker 06/08/2018  ? Diabetes mellitus, type II, insulin dependent ([place]) 06/08/2018  ? Claustrophobia 06/08/2018  ? Primary insomnia 06/08/2018  ? Hot flashes 06/08/2018  ? Abnormal weight gain 06/08/2018  ? Lower leg edema 11/10/2017  ? Insomnia due to other mental disorder 10/27/2017  ? History of substance abuse ([place]) 10/27/2017  ? GAD (generalized anxiety disorder) 10/27/2017  ? Class 3 severe obesity due to excess calories with serious comorbidity and body mass index (BMI) of 40.0 to 44.9 in adult [facility]) 08/31/2017  ? Cerumen impaction 01/24/2015  ? Hyperlipidemia 01/24/2015  ? Gastroesophageal reflux disease with esophagitis 01/10/2015  ? Foot callus 10/03/2014  ? PTSD (post-traumatic stress disorder) 09/26/2014  ? Depression 05/19/2014  ? Anxiety 05/19/2014  ? Chronic bilateral low back pain with right-sided sciatica 05/19/2014  ? DDD (degenerative disc disease), lumbar 05/19/2014  ? Neuropathy 02/05/2014  ? IT band syndrome 02/05/2014  ? Sinus tachycardia 02/05/2014  ? Memory changes 02/05/2014  ? Word finding difficulty 02/05/2014  ? Cervical cancer ([place]) 02/05/2014  ? Type II diabetes mellitus, uncontrolled 02/03/2014  ? Unspecified asthma(493.90) 02/03/2014  ? Essential hypertension, benign 02/03/2014  ? Pulmonary emphysema ([place]) 07/26/2012  ? ?Past Medical History:  ?Diagnosis Date  ? Anxiety   ? Arthritis   ? Asthma   ? Cancer [facility])   ? COPD (chronic obstructive pulmonary disease) ([place])   ? Degenerative disc disease,  lumbar   ? Diabetes mellitus without complication ([place])   ? Dyspnea   ? with exertion due to COPD  ? GERD (gastroesophageal reflux disease)   ? History of alcohol abuse   ? 20-30 years ago, patient was going through divorce but does not currently drink any alcohol  ? History of marijuana use   ? "its been a long time since I last smoked because of my COPD"  ? History of ovarian cancer 1984  ? Hypertension   ? Pneumonia   ? Substance abuse ([place])   ?  patient denies, states she has only ever used marijuana  ?  ?Family History  ?Problem Relation Age of Onset  ? Cancer Paternal Grandmother   ? Diabetes Paternal Grandmother   ? Diabetes Paternal Grandfather   ? Heart attack Maternal Aunt   ? Cancer Maternal Uncle   ? Cancer Maternal Grandmother   ? Heart attack Mother   ? Cancer Mother   ? Stroke Mother   ? Diabetes Father   ? Heart attack Father   ? Diabetes Paternal Uncle   ? Diabetes Paternal Uncle   ?  ?Past Surgical History:  ?Procedure Laterality Date  ? DILATION AND CURETTAGE OF UTERUS    ? ECTOPIC PREGNANCY SURGERY    ? patient stated she had to have tube removed  ? TRICEPS TENDON REPAIR Left 02/07/2022  ? Procedure: left triceps repair;  Surgeon: Leandrew Koyanagi, MD;  Location: [facility];  Service: Orthopedics;  Laterality: Left;  ? ?Social History  ? ?Occupational History  ? Not on file  ?Tobacco Use  ? Smoking status: Every Day  ?  Packs/day: 1.00  ?  Types: Cigarettes  ? Smokeless tobacco: Never  ? Tobacco comments:  ?  1 pack a day   ?Vaping Use  ? Vaping Use: Never used  ?Substance and Sexual Activity  ? Alcohol use: Not Currently  ? Drug use: Not Currently  ?  Types: Marijuana  ?  Comment: no longer smokes marijuana due to COPD  ? Sexual activity: Yes  ? ? ? ?

## 2022-05-09 ENCOUNTER — Encounter (HOSPITAL_COMMUNITY): Payer: Self-pay | Admitting: Orthopaedic Surgery

## 2022-05-09 ENCOUNTER — Other Ambulatory Visit: Payer: Self-pay

## 2022-05-09 DIAGNOSIS — J432 Centrilobular emphysema: Secondary | ICD-10-CM | POA: Insufficient documentation

## 2022-05-09 HISTORY — DX: Centrilobular emphysema: J43.2

## 2022-05-09 NOTE — Progress Notes (Signed)
PCP - Philis Fendt, PA-C ?Cardiologist - denies ? ?PPM/ICD - denies ?Device Orders - n/a ?Rep Notified - n/a ? ?Chest x-ray - 12/13/2021 ?EKG - 02/03/2022 ?Stress Test - denies ?ECHO - denies ?Cardiac Cath - denies ? ?CPAP - n/a ? ?Fasting Blood Sugar - 80 - 125 ?Checks Blood Sugar 1-3/day ? ?Blood Thinner Instructions: n/a ?Aspirin Instructions: Patient was instructed to call MD and ask if she must hold Aspirin prior surgery. Patient verbalized understanding ? ?ERAS Protcol - yes until 12:45 ? ?COVID TEST- n/a ? ?Anesthesia review: no ? ?Patient verbally denies any shortness of breath, fever, cough and chest pain during phone call ? ? ?-------------  SDW INSTRUCTIONS given: ? ?Your procedure is scheduled on Monday, May 15th, 2023. ? Report to [facility] Main Entrance "A" at 13:15 A.M., and check in at the Admitting office. ? Call this number if you have problems the morning of surgery: ? [phone] ? ? Remember: ? Do not eat after midnight the night before your surgery ? ?You may drink clear liquids until 12:45 the morning of your surgery.   ?Clear liquids allowed are: Water, Non-Citrus Juices (without pulp), Carbonated Beverages, Clear Tea, Black Coffee Only, and Gatorade ?  ? Take these medicines the morning of surgery with A SIP OF WATER Amlodipine, Atenolol, Cymbalta, Nexium, Crestor, inhalers (Please bring all inhalers with you the day of surgery).   ? ?As of today, STOP taking any Aspirin (unless otherwise instructed by your surgeon) Aleve, Naproxen, Ibuprofen, Motrin, Advil, Goody's, BC's, all herbal medications, fish oil, and all vitamins. ? ? ?Do not take Metformin, Januvia, Actos, the morning of surgery. ?Do not take Glipizide the night before surgery and the morning of surgery ? ?THE NIGHT BEFORE SURGERY, take 10 units of insulin NPH Human (NOVOLIN N RELION) - 50 % of your regular dose. ? ?THE MORNING OF SURGERY, , take 2 units of insulin NPH Human (NOVOLIN N RELION) - 50 % of your regular  dose.     ? ?If your CBG is greater than 220 mg/dL, you may take ? of your sliding scale (correction) dose of insulin. ? ? ?How do I manage my blood sugar before surgery? ?Check your blood sugar at least 4 times a day, starting 2 days before surgery, to make sure that the level is not too high or low. ? ?Check your blood sugar the morning of your surgery when you wake up and every 2 hours until you get to the Short Stay unit. ? ?If your blood sugar is less than 70 mg/dL, you will need to treat for low blood sugar: ?Do not take insulin. ?Treat a low blood sugar (less than 70 mg/dL) with ? cup of clear juice (cranberry or apple), 4 glucose tablets, OR glucose gel. ?Recheck blood sugar in 15 minutes after treatment (to make sure it is greater than 70 mg/dL). If your blood sugar is not greater than 70 mg/dL on recheck, call [phone] for further instructions. ?Report your blood sugar to the short stay nurse when you get to Short Stay. ? ? The day of surgery: ? ?           Do not wear jewelry, make up, or nail polish ?           Do not wear lotions, powders, perfumes/colognes, or deodorant. ?           Do not shave 48 hours prior to surgery.  Men may shave face and neck. ?  Do not bring valuables to the hospital. ?           [facility] is not responsible for any belongings or valuables. ? ?Do NOT Smoke (Tobacco/Vaping) 24 hours prior to your procedure ?If you use a CPAP at night, you may bring all equipment for your overnight stay. ?  ?Contacts, glasses, dentures or bridgework may not be worn into surgery.    ?  ?For patients admitted to the hospital, discharge time will be determined by your treatment team. ?  ?Patients discharged the day of surgery will not be allowed to drive home, and someone needs to stay with them for 24 hours. ? ? ?Special instructions:   ?[facility]- Preparing For Surgery ? ?Before surgery, you can play an important role. Because skin is not sterile, your skin needs to be as free of  germs as possible. You can reduce the number of germs on your skin by washing with CHG (chlorahexidine gluconate) Soap before surgery.  CHG is an antiseptic cleaner which kills germs and bonds with the skin to continue killing germs even after washing.   ? ?Oral Hygiene is also important to reduce your risk of infection.  Remember - BRUSH YOUR TEETH THE MORNING OF SURGERY WITH YOUR REGULAR TOOTHPASTE ? ?Please do not use if you have an allergy to CHG or antibacterial soaps. If your skin becomes reddened/irritated stop using the CHG.  ?Do not shave (including legs and underarms) for at least 48 hours prior to first CHG shower. It is OK to shave your face. ? ?Please follow these instructions carefully. ?  ?Shower the NIGHT BEFORE SURGERY and the MORNING OF SURGERY with DIAL Soap.  ? ?Pat yourself dry with a CLEAN TOWEL. ? ?Wear CLEAN PAJAMAS to bed the night before surgery ? ?Place CLEAN SHEETS on your bed the night of your first shower and DO NOT SLEEP WITH PETS. ? ? ?Day of Surgery: ?Please shower morning of surgery  ?Wear Clean/Comfortable clothing the morning of surgery ?Do not apply any deodorants/lotions.   ?Remember to brush your teeth WITH YOUR REGULAR TOOTHPASTE. ?  ?Questions were answered. Patient verbalized understanding of instructions.  ? ? ?    ?

## 2022-05-11 DIAGNOSIS — S51002S Unspecified open wound of left elbow, sequela: Secondary | ICD-10-CM

## 2022-05-11 DIAGNOSIS — S51002A Unspecified open wound of left elbow, initial encounter: Secondary | ICD-10-CM

## 2022-05-11 HISTORY — DX: Unspecified open wound of left elbow, sequela: S51.002S

## 2022-05-11 NOTE — Progress Notes (Signed)
Called patient tonight about new arrival time of 11:00 am tomorrow due to scheduling changes.   ?

## 2022-05-12 ENCOUNTER — Telehealth: Payer: Self-pay | Admitting: Orthopaedic Surgery

## 2022-05-12 ENCOUNTER — Encounter: Payer: Self-pay | Admitting: Orthopaedic Surgery

## 2022-05-12 ENCOUNTER — Other Ambulatory Visit: Payer: Self-pay | Admitting: Physician Assistant

## 2022-05-12 ENCOUNTER — Ambulatory Visit (HOSPITAL_BASED_OUTPATIENT_CLINIC_OR_DEPARTMENT_OTHER): Payer: 59 | Admitting: Anesthesiology

## 2022-05-12 ENCOUNTER — Ambulatory Visit (HOSPITAL_COMMUNITY)
Admission: RE | Admit: 2022-05-12 | Discharge: 2022-05-12 | Disposition: A | Discharge status: Home / Self Care | Payer: 59 | Source: Ambulatory Visit | Attending: Orthopaedic Surgery | Admitting: Orthopaedic Surgery

## 2022-05-12 ENCOUNTER — Encounter (HOSPITAL_COMMUNITY)
Admission: RE | Disposition: A | Discharge status: Home / Self Care | Payer: Self-pay | Source: Ambulatory Visit | Attending: Orthopaedic Surgery

## 2022-05-12 ENCOUNTER — Encounter (HOSPITAL_COMMUNITY): Payer: Self-pay | Admitting: Orthopaedic Surgery

## 2022-05-12 ENCOUNTER — Ambulatory Visit (HOSPITAL_COMMUNITY): Payer: 59 | Admitting: Anesthesiology

## 2022-05-12 DIAGNOSIS — T8149XA Infection following a procedure, other surgical site, initial encounter: Secondary | ICD-10-CM | POA: Diagnosis not present

## 2022-05-12 DIAGNOSIS — F418 Other specified anxiety disorders: Secondary | ICD-10-CM | POA: Diagnosis not present

## 2022-05-12 DIAGNOSIS — E119 Type 2 diabetes mellitus without complications: Secondary | ICD-10-CM | POA: Insufficient documentation

## 2022-05-12 DIAGNOSIS — Z6841 Body Mass Index (BMI) 40.0 and over, adult: Secondary | ICD-10-CM | POA: Insufficient documentation

## 2022-05-12 DIAGNOSIS — I10 Essential (primary) hypertension: Secondary | ICD-10-CM | POA: Diagnosis not present

## 2022-05-12 DIAGNOSIS — F40240 Claustrophobia: Secondary | ICD-10-CM

## 2022-05-12 DIAGNOSIS — F17210 Nicotine dependence, cigarettes, uncomplicated: Secondary | ICD-10-CM | POA: Insufficient documentation

## 2022-05-12 DIAGNOSIS — J449 Chronic obstructive pulmonary disease, unspecified: Secondary | ICD-10-CM | POA: Diagnosis not present

## 2022-05-12 DIAGNOSIS — Z7984 Long term (current) use of oral hypoglycemic drugs: Secondary | ICD-10-CM | POA: Diagnosis not present

## 2022-05-12 DIAGNOSIS — R69 Illness, unspecified: Secondary | ICD-10-CM | POA: Diagnosis not present

## 2022-05-12 DIAGNOSIS — F1291 Cannabis use, unspecified, in remission: Secondary | ICD-10-CM | POA: Diagnosis not present

## 2022-05-12 DIAGNOSIS — T8189XA Other complications of procedures, not elsewhere classified, initial encounter: Secondary | ICD-10-CM | POA: Diagnosis not present

## 2022-05-12 DIAGNOSIS — F5101 Primary insomnia: Secondary | ICD-10-CM

## 2022-05-12 DIAGNOSIS — X58XXXA Exposure to other specified factors, initial encounter: Secondary | ICD-10-CM | POA: Diagnosis not present

## 2022-05-12 DIAGNOSIS — S51002A Unspecified open wound of left elbow, initial encounter: Secondary | ICD-10-CM

## 2022-05-12 DIAGNOSIS — Z794 Long term (current) use of insulin: Secondary | ICD-10-CM | POA: Insufficient documentation

## 2022-05-12 DIAGNOSIS — K219 Gastro-esophageal reflux disease without esophagitis: Secondary | ICD-10-CM | POA: Diagnosis not present

## 2022-05-12 DIAGNOSIS — S51002S Unspecified open wound of left elbow, sequela: Secondary | ICD-10-CM

## 2022-05-12 HISTORY — PX: I & D EXTREMITY: SHX5045

## 2022-05-12 LAB — GLUCOSE, CAPILLARY
Glucose-Capillary: 202 mg/dL — ABNORMAL HIGH (ref 70–99)
Glucose-Capillary: 144 mg/dL — ABNORMAL HIGH (ref 70–99)
Glucose-Capillary: 122 mg/dL — ABNORMAL HIGH (ref 70–99)

## 2022-05-12 SURGERY — IRRIGATION AND DEBRIDEMENT EXTREMITY
Anesthesia: General | Site: Arm Lower | Laterality: Left

## 2022-05-12 MED ORDER — GLYCOPYRROLATE PF 0.2 MG/ML IJ SOSY
PREFILLED_SYRINGE | INTRAMUSCULAR | Status: AC
  Filled 2022-05-12: qty 1

## 2022-05-12 MED ORDER — CEFAZOLIN SODIUM-DEXTROSE 2-3 GM-%(50ML) IV SOLR
INTRAVENOUS | Status: DC | PRN
Start: 2022-05-12 — End: 2022-05-12
  Administered 2022-05-12: 2 g via INTRAVENOUS

## 2022-05-12 MED ORDER — SULFAMETHOXAZOLE-TRIMETHOPRIM 800-160 MG PO TABS: 1.0000 | ORAL_TABLET | Freq: Two times a day (BID) | ORAL | 0 refills | Status: DC

## 2022-05-12 MED ORDER — PROPOFOL 10 MG/ML IV BOLUS
INTRAVENOUS | Status: DC | PRN
  Administered 2022-05-12 (×2): 50 mg via INTRAVENOUS
  Administered 2022-05-12: 150 mg via INTRAVENOUS

## 2022-05-12 MED ORDER — EPHEDRINE SULFATE-NACL 50-0.9 MG/10ML-% IV SOSY
PREFILLED_SYRINGE | INTRAVENOUS | Status: DC | PRN
  Administered 2022-05-12 (×2): 5 mg via INTRAVENOUS

## 2022-05-12 MED ORDER — GLYCOPYRROLATE PF 0.2 MG/ML IJ SOSY
PREFILLED_SYRINGE | INTRAMUSCULAR | Status: DC | PRN
Start: 2022-05-12 — End: 2022-05-12
  Administered 2022-05-12: .2 mg via INTRAVENOUS

## 2022-05-12 MED ORDER — LACTATED RINGERS IV SOLN: INTRAVENOUS | Status: DC

## 2022-05-12 MED ORDER — ACETAMINOPHEN 500 MG PO TABS
ORAL_TABLET | ORAL | Status: AC
  Administered 2022-05-12: 1000 mg via ORAL
  Filled 2022-05-12: qty 2

## 2022-05-12 MED ORDER — HYDROCODONE-ACETAMINOPHEN 7.5-325 MG PO TABS: 1.0000 | ORAL_TABLET | Freq: Every day | ORAL | 0 refills | Status: DC | PRN

## 2022-05-12 MED ORDER — DEXAMETHASONE SODIUM PHOSPHATE 10 MG/ML IJ SOLN
INTRAMUSCULAR | Status: AC
  Filled 2022-05-12: qty 1

## 2022-05-12 MED ORDER — ACETAMINOPHEN 500 MG PO TABS: 1000.0000 mg | ORAL_TABLET | Freq: Once | ORAL | Status: AC

## 2022-05-12 MED ORDER — PROPOFOL 10 MG/ML IV BOLUS
INTRAVENOUS | Status: AC
  Filled 2022-05-12: qty 20

## 2022-05-12 MED ORDER — SODIUM CHLORIDE 0.9 % IR SOLN
Status: DC | PRN
Start: 2022-05-12 — End: 2022-05-12
  Administered 2022-05-12: 3000 mL

## 2022-05-12 MED ORDER — LIDOCAINE 2% (20 MG/ML) 5 ML SYRINGE
INTRAMUSCULAR | Status: DC | PRN
  Administered 2022-05-12: 100 mg via INTRAVENOUS

## 2022-05-12 MED ORDER — FENTANYL CITRATE (PF) 250 MCG/5ML IJ SOLN
INTRAMUSCULAR | Status: AC
  Filled 2022-05-12: qty 5

## 2022-05-12 MED ORDER — LIDOCAINE 2% (20 MG/ML) 5 ML SYRINGE
INTRAMUSCULAR | Status: AC
  Filled 2022-05-12: qty 5

## 2022-05-12 MED ORDER — FENTANYL CITRATE (PF) 100 MCG/2ML IJ SOLN: 25.0000 ug | INTRAMUSCULAR | Status: DC | PRN

## 2022-05-12 MED ORDER — FENTANYL CITRATE (PF) 100 MCG/2ML IJ SOLN
INTRAMUSCULAR | Status: DC | PRN
Start: 2022-05-12 — End: 2022-05-12
  Administered 2022-05-12: 25 ug via INTRAVENOUS
  Administered 2022-05-12 (×2): 50 ug via INTRAVENOUS
  Administered 2022-05-12 (×3): 25 ug via INTRAVENOUS

## 2022-05-12 MED ORDER — ONDANSETRON HCL 4 MG/2ML IJ SOLN: 4.0000 mg | Freq: Once | INTRAMUSCULAR | Status: DC | PRN

## 2022-05-12 MED ORDER — INSULIN ASPART 100 UNIT/ML IJ SOLN
0.0000 [IU] | INTRAMUSCULAR | Status: AC | PRN
  Administered 2022-05-12: 4 [IU] via SUBCUTANEOUS
  Administered 2022-05-12: 2 [IU] via SUBCUTANEOUS

## 2022-05-12 MED ORDER — DEXAMETHASONE SODIUM PHOSPHATE 10 MG/ML IJ SOLN
INTRAMUSCULAR | Status: DC | PRN
  Administered 2022-05-12: 5 mg via INTRAVENOUS

## 2022-05-12 MED ORDER — PHENYLEPHRINE HCL-NACL 20-0.9 MG/250ML-% IV SOLN
INTRAVENOUS | Status: DC | PRN
  Administered 2022-05-12: 40 ug/min via INTRAVENOUS

## 2022-05-12 MED ORDER — ONDANSETRON HCL 4 MG/2ML IJ SOLN
INTRAMUSCULAR | Status: AC
  Filled 2022-05-12: qty 2

## 2022-05-12 MED ORDER — ORAL CARE MOUTH RINSE: 15.0000 mL | Freq: Once | OROMUCOSAL | Status: AC

## 2022-05-12 MED ORDER — MIDAZOLAM HCL 5 MG/5ML IJ SOLN
INTRAMUSCULAR | Status: DC | PRN
  Administered 2022-05-12: 2 mg via INTRAVENOUS

## 2022-05-12 MED ORDER — TRAMADOL HCL 50 MG PO TABS: 50.0000 mg | ORAL_TABLET | Freq: Four times a day (QID) | ORAL | 0 refills | Status: DC | PRN

## 2022-05-12 MED ORDER — MIDAZOLAM HCL 2 MG/2ML IJ SOLN
INTRAMUSCULAR | Status: AC
  Filled 2022-05-12: qty 2

## 2022-05-12 MED ORDER — CHLORHEXIDINE GLUCONATE 0.12 % MT SOLN: 15.0000 mL | Freq: Once | OROMUCOSAL | Status: AC

## 2022-05-12 MED ORDER — CHLORHEXIDINE GLUCONATE 0.12 % MT SOLN
OROMUCOSAL | Status: AC
  Administered 2022-05-12: 15 mL via OROMUCOSAL
  Filled 2022-05-12: qty 15

## 2022-05-12 MED ORDER — ONDANSETRON HCL 4 MG/2ML IJ SOLN
INTRAMUSCULAR | Status: DC | PRN
  Administered 2022-05-12: 4 mg via INTRAVENOUS

## 2022-05-12 SURGICAL SUPPLY — 53 items
BAG COUNTER SPONGE SURGICOUNT (BAG) ×2 IMPLANT
BNDG COHESIVE 4X5 TAN STRL (GAUZE/BANDAGES/DRESSINGS) ×2 IMPLANT
BNDG COHESIVE 6X5 TAN STRL LF (GAUZE/BANDAGES/DRESSINGS) ×4 IMPLANT
BNDG ELASTIC 3X5.8 VLCR STR LF (GAUZE/BANDAGES/DRESSINGS) ×1 IMPLANT
BNDG ELASTIC 4X5.8 VLCR STR LF (GAUZE/BANDAGES/DRESSINGS) ×2 IMPLANT
BNDG GAUZE ELAST 4 BULKY (GAUZE/BANDAGES/DRESSINGS) ×2 IMPLANT
COVER SURGICAL LIGHT HANDLE (MISCELLANEOUS) ×2 IMPLANT
CUFF TOURN SGL QUICK 24 (TOURNIQUET CUFF)
CUFF TOURN SGL QUICK 34 (TOURNIQUET CUFF)
CUFF TOURN SGL QUICK 42 (TOURNIQUET CUFF) IMPLANT
CUFF TRNQT CYL 24X4X16.5-23 (TOURNIQUET CUFF) IMPLANT
CUFF TRNQT CYL 34X4.125X (TOURNIQUET CUFF) IMPLANT
DRAPE U-SHAPE 47X51 STRL (DRAPES) ×2 IMPLANT
DRSG PAD ABDOMINAL 8X10 ST (GAUZE/BANDAGES/DRESSINGS) ×2 IMPLANT
DURAPREP 26ML APPLICATOR (WOUND CARE) ×2 IMPLANT
ELECT REM PT RETURN 9FT ADLT (ELECTROSURGICAL)
ELECTRODE REM PT RTRN 9FT ADLT (ELECTROSURGICAL) IMPLANT
GAUZE SPONGE 4X4 12PLY STRL (GAUZE/BANDAGES/DRESSINGS) ×3 IMPLANT
GAUZE XEROFORM 5X9 LF (GAUZE/BANDAGES/DRESSINGS) ×2 IMPLANT
GLOVE BIOGEL PI IND STRL 7.0 (GLOVE) ×1 IMPLANT
GLOVE BIOGEL PI INDICATOR 7.0 (GLOVE) ×1
GLOVE ECLIPSE 7.0 STRL STRAW (GLOVE) ×2 IMPLANT
GLOVE SKINSENSE NS SZ7.5 (GLOVE) ×2
GLOVE SKINSENSE STRL SZ7.5 (GLOVE) ×2 IMPLANT
GLOVE SURG UNDER POLY LF SZ7 (GLOVE) ×38 IMPLANT
GLOVE SURG UNDER POLY LF SZ7.5 (GLOVE) ×8 IMPLANT
GOWN STRL REIN XL XLG (GOWN DISPOSABLE) ×4 IMPLANT
HANDPIECE INTERPULSE COAX TIP (DISPOSABLE)
KIT BASIN OR (CUSTOM PROCEDURE TRAY) ×2 IMPLANT
KIT TURNOVER KIT B (KITS) ×2 IMPLANT
MANIFOLD NEPTUNE II (INSTRUMENTS) ×2 IMPLANT
PACK ORTHO EXTREMITY (CUSTOM PROCEDURE TRAY) ×2 IMPLANT
PAD ARMBOARD 7.5X6 YLW CONV (MISCELLANEOUS) ×4 IMPLANT
PAD CAST 4YDX4 CTTN HI CHSV (CAST SUPPLIES) IMPLANT
PADDING CAST ABS 4INX4YD NS (CAST SUPPLIES) ×1
PADDING CAST ABS COTTON 4X4 ST (CAST SUPPLIES) ×1 IMPLANT
PADDING CAST COTTON 4X4 STRL (CAST SUPPLIES) ×1
PADDING CAST COTTON 6X4 STRL (CAST SUPPLIES) ×2 IMPLANT
SET HNDPC FAN SPRY TIP SCT (DISPOSABLE) IMPLANT
SLING ARM FOAM STRAP LRG (SOFTGOODS) ×1 IMPLANT
SPLINT FIBERGLASS 3X35 (CAST SUPPLIES) ×1 IMPLANT
SPONGE T-LAP 18X18 ~~LOC~~+RFID (SPONGE) ×2 IMPLANT
STOCKINETTE IMPERVIOUS 9X36 MD (GAUZE/BANDAGES/DRESSINGS) ×2 IMPLANT
SUT ETHILON 2 0 FS 18 (SUTURE) ×2 IMPLANT
SUT ETHILON 2 0 PSLX (SUTURE) IMPLANT
SUT VIC AB 2-0 FS1 27 (SUTURE) ×4 IMPLANT
SWAB CULTURE ESWAB REG 1ML (MISCELLANEOUS) IMPLANT
TOWEL GREEN STERILE (TOWEL DISPOSABLE) ×2 IMPLANT
TOWEL GREEN STERILE FF (TOWEL DISPOSABLE) ×2 IMPLANT
TUBE CONNECTING 12X1/4 (SUCTIONS) ×2 IMPLANT
UNDERPAD 30X36 HEAVY ABSORB (UNDERPADS AND DIAPERS) ×4 IMPLANT
WATER STERILE IRR 1000ML POUR (IV SOLUTION) ×2 IMPLANT
YANKAUER SUCT BULB TIP NO VENT (SUCTIONS) ×2 IMPLANT

## 2022-05-12 NOTE — Discharge Instructions (Signed)
? ?  Postoperative instructions:  Weightbearing instructions: non weight bearing  Dressing instructions: Keep your dressing and/or splint clean and dry at all times.  It will be removed at your first post-operative appointment.  Your stitches and/or staples will be removed at this visit.  Incision instructions:  Do not soak your incision for 3 weeks after surgery.  If the incision gets wet, pat dry and do not scrub the incision.  Pain control:  You have been given a prescription to be taken as directed for post-operative pain control.  In addition, elevate the operative extremity above the heart at all times to prevent swelling and throbbing pain.  Take over-the-counter Colace, 100mg by mouth twice a day while taking narcotic pain medications to help prevent constipation.  Follow up appointments: 1) 7 days for wound check. 2) Dr. Carmen Tolliver as scheduled.   -------------------------------------------------------------------------------------------------------------  After Surgery Pain Control:  After your surgery, post-surgical discomfort or pain is likely. This discomfort can last several days to a few weeks. At certain times of the day your discomfort may be more intense.  Did you receive a nerve block?  A nerve block can provide pain relief for one hour to two days after your surgery. As long as the nerve block is working, you will experience little or no sensation in the area the surgeon operated on.  As the nerve block wears off, you will begin to experience pain or discomfort. It is very important that you begin taking your prescribed pain medication before the nerve block fully wears off. Treating your pain at the first sign of the block wearing off will ensure your pain is better controlled and more tolerable when full-sensation returns. Do not wait until the pain is intolerable, as the medicine will be less effective. It is better to treat pain in advance than to try and catch up.  General  Anesthesia:  If you did not receive a nerve block during your surgery, you will need to start taking your pain medication shortly after your surgery and should continue to do so as prescribed by your surgeon.  Pain Medication:  Most commonly we prescribe Vicodin and Percocet for post-operative pain. Both of these medications contain a combination of acetaminophen (Tylenol) and a narcotic to help control pain.   It takes between 30 and 45 minutes before pain medication starts to work. It is important to take your medication before your pain level gets too intense.   Nausea is a common side effect of many pain medications. You will want to eat something before taking your pain medicine to help prevent nausea.   If you are taking a prescription pain medication that contains acetaminophen, we recommend that you do not take additional over the counter acetaminophen (Tylenol).  Other pain relieving options:   Using a cold pack to ice the affected area a few times a day (15 to 20 minutes at a time) can help to relieve pain, reduce swelling and bruising.   Elevation of the affected area can also help to reduce pain and swelling.  

## 2022-05-12 NOTE — Op Note (Signed)
? ?  Date of Surgery: 05/12/2022 ? ?INDICATIONS: Kathleen Le is a 61 y.o.-year-old female with a chronic draining left elbow wound.  She underwent triceps repair about 12 weeks ago..  The patient did consent to the procedure after discussion of the risks and benefits. ? ?PREOPERATIVE DIAGNOSIS: Chronically draining left elbow wound ? ?POSTOPERATIVE DIAGNOSIS: Same ? ?Operative findings:  ?Inflamed olecranon bursitis ?Partially healed triceps repair ? ?PROCEDURE:  ?Excisional debridement of left elbow wound including skin, bursa, subcutaneous tissue, bone ?Removal of deep sutures from previous triceps repair ?Adjacent tissue rearrangement left elbow 3 cm ? ?Debridement type: Excisional Debridement ? ?Side: left ? ?Body Location: Elbow ? ?Tools used for debridement: scalpel and rongeur ? ?SURGEON: N. Eduard Roux, M.D. ? ?ASSIST: Madalyn Rob, PA-C; necessary for the timely completion of procedure and due to complexity of procedure. ? ?ANESTHESIA:  general ? ?IV FLUIDS AND URINE: See anesthesia. ? ?ESTIMATED BLOOD LOSS: Minimal mL. ? ?COMPLICATIONS: see description of procedure. ? ?DESCRIPTION OF PROCEDURE: The patient was brought to the operating room.  The patient had been signed prior to the procedure and this was documented. The patient had the anesthesia placed by the anesthesiologist.  A time-out was performed to confirm that this was the correct patient, site, side and location. The patient did receive antibiotics after cultures.  A tourniquet was placed.  The patient had the operative extremity prepped and draped in the standard surgical fashion.    ?An incision was created posterior to the elbow.  The draining sinus was excised in a full-thickness manner sharply down to the olecranon.  There was inflamed olecranon bursitis and inflamed tenderness tissue.  Sharp excisional debridement was performed in all these areas.  The olecranon was very prominent therefore a rondure was used to debride away a portion  of the tip of the olecranon to aid in closure.  The sutures from the previous repair had unraveled from part of the triceps tendon.  The sutures were removed as well.  The ulnar half of the triceps repair.  Failed and the tissue had resorbed.  I was able to look directly into the posterior elbow joint.  There is no obvious infection.  2 tissue cultures were taken.  After thorough debridement 3 L of normal saline was irrigated with pulsatile gun.  The tourniquet was then deflated and hemostasis was obtained.  I used a 0 PDS to close the deep tissues over the posterior aspect of the elbow joint.  Back cuts were made for adjacent tissue rearrangement.  Layered closure was performed.  Sterile dressings were applied.  The elbow was immobilized in a long-arm splint at about 45 degrees to allow the soft tissues to heal without tension.  Patient tolerated procedure well had no many complications. ? ?Kathleen Le was necessary for opening, closing, retracting, limb positioning and overall facilitation and timely completion of the procedure. ? ?POSTOPERATIVE PLAN: Patient will be discharged home and follow-up in a week for wound recheck and splint removal.  We will place her on 2 weeks of Bactrim.  We will follow-up on the cultures. ? ?N. Eduard Roux, MD ?3:03 PM ?

## 2022-05-12 NOTE — Anesthesia Preprocedure Evaluation (Addendum)
Anesthesia Evaluation  ?Patient identified by MRN, date of birth, ID band ?Patient awake ? ? ? ?Reviewed: ?Allergy & Precautions, NPO status , Patient's Chart, lab work & pertinent test results, reviewed documented beta blocker date and time  ? ?Airway ?Mallampati: III ? ?TM Distance: >3 FB ?Neck ROM: Full ? ? ? Dental ? ?(+) Dental Advisory Given, Missing ?  ?Pulmonary ?asthma , COPD,  COPD inhaler, Current Smoker and Patient abstained from smoking.,  ?  ?Pulmonary exam normal ?breath sounds clear to auscultation ? ? ? ? ? ? Cardiovascular ?hypertension, Pt. on medications and Pt. on home beta blockers ?(-) angina(-) Past MI Normal cardiovascular exam ?Rhythm:Regular Rate:Normal ? ? ?  ?Neuro/Psych ?PSYCHIATRIC DISORDERS Anxiety Depression  Neuromuscular disease   ? GI/Hepatic ?GERD  Medicated and Controlled,(+)  ?  ? substance abuse (former history of alcohol/marijuana use) ? ,   ?Endo/Other  ?diabetes, Type 2, Oral Hypoglycemic Agents, Insulin DependentMorbid obesity ? Renal/GU ?negative Renal ROS  ? ?  ?Musculoskeletal ? ?(+) Arthritis , Osteoarthritis,   ? Abdominal ?  ?Peds ? Hematology ?negative hematology ROS ?(+)   ?Anesthesia Other Findings ? ? Reproductive/Obstetrics ? ?  ? ? ? ? ? ? ? ? ? ? ? ? ? ?  ?  ? ? ? ? ? ? ? ?Anesthesia Physical ?Anesthesia Plan ? ?ASA: 3 ? ?Anesthesia Plan: General  ? ?Post-op Pain Management: Minimal or no pain anticipated and Tylenol PO (pre-op)*  ? ?Induction: Intravenous ? ?PONV Risk Score and Plan: 2 and Treatment may vary due to age or medical condition, Ondansetron, Midazolam, Propofol infusion and Scopolamine patch - Pre-op ? ?Airway Management Planned: LMA and Oral ETT ? ?Additional Equipment: None ? ?Intra-op Plan:  ? ?Post-operative Plan: Extubation in OR ? ?Informed Consent: I have reviewed the patients History and Physical, chart, labs and discussed the procedure including the risks, benefits and alternatives for the proposed  anesthesia with the patient or authorized representative who has indicated his/her understanding and acceptance.  ? ? ? ?Dental advisory given ? ?Plan Discussed with: CRNA and Anesthesiologist ? ?Anesthesia Plan Comments: (LMA vs. ETT depending on patient positioning. Norton Blizzard, MD  ?)  ? ? ? ? ?Anesthesia Quick Evaluation ? ?

## 2022-05-12 NOTE — H&P (Signed)
? ? ?PREOPERATIVE H&P ? ?Chief Complaint: LEFT ELBOW DRAINAGE ? ?HPI: ?Kathleen Le is a 61 y.o. female who presents for surgical treatment of LEFT ELBOW DRAINAGE.  She denies any changes in medical history. ? ?Past Medical History:  ?Diagnosis Date  ? Anxiety   ? Arthritis   ? Asthma   ? Cancer [facility])   ? COPD (chronic obstructive pulmonary disease) ([place])   ? Degenerative disc disease, lumbar   ? Diabetes mellitus without complication ([place])   ? Dyspnea   ? with exertion due to COPD  ? GERD (gastroesophageal reflux disease)   ? History of alcohol abuse   ? 20-30 years ago, patient was going through divorce but does not currently drink any alcohol  ? History of marijuana use   ? "its been a long time since I last smoked because of my COPD"  ? History of ovarian cancer 1984  ? Hypertension   ? Pneumonia   ? Substance abuse ([place])   ? patient denies, states she has only ever used marijuana  ? ?Past Surgical History:  ?Procedure Laterality Date  ? DILATION AND CURETTAGE OF UTERUS    ? ECTOPIC PREGNANCY SURGERY    ? patient stated she had to have tube removed  ? TONSILLECTOMY    ? TRICEPS TENDON REPAIR Left 02/07/2022  ? Procedure: left triceps repair;  Surgeon: Leandrew Koyanagi, MD;  Location: [facility];  Service: Orthopedics;  Laterality: Left;  ? ?Social History  ? ?Socioeconomic History  ? Marital status: Married  ?  Spouse name: Not on file  ? Number of children: Not on file  ? Years of education: Not on file  ? Highest education level: Not on file  ?Occupational History  ? Not on file  ?Tobacco Use  ? Smoking status: Every Day  ?  Packs/day: 1.00  ?  Types: Cigarettes  ? Smokeless tobacco: Never  ? Tobacco comments:  ?  1 pack a day   ?Vaping Use  ? Vaping Use: Never used  ?Substance and Sexual Activity  ? Alcohol use: Not Currently  ? Drug use: Not Currently  ?  Types: Marijuana  ?  Comment: no longer smokes marijuana due to COPD  ? Sexual activity: Yes  ?Other Topics Concern  ? Not on file  ?Social History Narrative  ?  Not on file  ? ?Social Determinants of Health  ? ?Financial Resource Strain: Not on file  ?Food Insecurity: Not on file  ?Transportation Needs: Not on file  ?Physical Activity: Not on file  ?Stress: Not on file  ?Social Connections: Not on file  ? ?Family History  ?Problem Relation Age of Onset  ? Cancer Paternal Grandmother   ? Diabetes Paternal Grandmother   ? Diabetes Paternal Grandfather   ? Heart attack Maternal Aunt   ? Cancer Maternal Uncle   ? Cancer Maternal Grandmother   ? Heart attack Mother   ? Cancer Mother   ? Stroke Mother   ? Diabetes Father   ? Heart attack Father   ? Diabetes Paternal Uncle   ? Diabetes Paternal Uncle   ? ?Allergies  ?Allergen Reactions  ? Gabapentin Nausea And Vomiting  ? Invokana [Canagliflozin] Other (See Comments)  ?  Vaginitis ?  ? Lyrica [Pregabalin] Other (See Comments)  ?  Pain in feet increased and spasm  ? ?Prior to Admission medications   ?Medication Sig Start Date End Date Taking? Authorizing Provider  ?albuterol (VENTOLIN HFA) 108 (90 Base) MCG/ACT inhaler Inhale  1-2 puffs into the lungs every 6 (six) hours as needed for wheezing or shortness of breath. 02/13/22  Yes Breeback, [name] L, PA-C  ?amLODipine (NORVASC) 5 MG tablet Take 1 tablet (5 mg total) by mouth daily. 05/06/22  Yes Breeback, [name] L, PA-C  ?aspirin 81 MG EC tablet Take 81 mg by mouth daily.   Yes [provider]  ?atenolol (TENORMIN) 50 MG tablet Take 1 tablet (50 mg total) by mouth daily. 05/06/22  Yes Breeback, [name] L, PA-C  ?Calcium Carbonate (CALCARB 600 PO) Take 600 mg by mouth daily.   Yes [provider]  ?doxepin (SINEQUAN) 25 MG capsule TAKE 2 CAPSULES BY MOUTH AT BEDTIME AS NEEDED ?Patient taking differently: Take 25-50 mg by mouth at bedtime as needed (sleep). 12/25/21  Yes Breeback, [name] L, PA-C  ?DULoxetine (CYMBALTA) 30 MG capsule Take 1 capsule by mouth twice daily ?Patient taking differently: 30 mg 2 (two) times daily. 03/14/22  Yes Breeback, [name] L, PA-C  ?esomeprazole  (NEXIUM) 20 MG capsule Take 20 mg by mouth daily.   Yes [provider]  ?Flaxseed, Linseed, (FLAX SEED OIL) 1000 MG CAPS Take 1,000 mg by mouth daily.   Yes [provider]  ?Fluticasone-Umeclidin-Vilant (TRELEGY ELLIPTA) 100-62.5-25 MCG/ACT AEPB Inhale 1 puff into the lungs daily. 03/04/22  Yes Breeback, [name] L, PA-C  ?glipiZIDE (GLUCOTROL) 10 MG tablet Take 1 tablet (10 mg total) by mouth 2 (two) times daily before a meal. 05/06/22  Yes Breeback, [name] L, PA-C  ?hydrochlorothiazide (HYDRODIURIL) 25 MG tablet Take 1 tablet by mouth once daily ?Patient taking differently: Take 25 mg by mouth daily. 03/14/22  Yes Breeback, [name] L, PA-C  ?ibuprofen (ADVIL) 600 MG tablet Take 1 tablet (600 mg total) by mouth every 6 (six) hours as needed. ?Patient taking differently: Take 600 mg by mouth every 6 (six) hours as needed for moderate pain. 12/25/21  Yes Melynda Ripple, MD  ?insulin NPH Human (NOVOLIN N RELION) 100 UNIT/ML injection Inject 0.4 mLs (40 Units total) into the skin at bedtime. ?Patient taking differently: Inject 5-20 Units into the skin See admin instructions. Inject 5units with breakfast and 20units at bedtime 04/21/22  Yes Breeback, [name] L, PA-C  ?ipratropium-albuterol (DUONEB) 0.5-2.5 (3) MG/3ML SOLN Take 3 mLs by nebulization every 6 (six) hours as needed. 03/04/22  Yes Breeback, [name] L, PA-C  ?lisinopril (ZESTRIL) 5 MG tablet Take 1 tablet by mouth once daily ?Patient taking differently: Take 5 mg by mouth daily. 02/28/22  Yes Breeback, [name] L, PA-C  ?magnesium gluconate (MAGONATE) 500 MG tablet Take 500 mg by mouth daily.   Yes [provider]  ?meloxicam (MOBIC) 15 MG tablet Take 1 tablet by mouth once daily ?Patient taking differently: Take 15 mg by mouth daily. 02/28/22  Yes Breeback, [name] L, PA-C  ?metFORMIN (GLUCOPHAGE) 1000 MG tablet Take 1 tablet (1,000 mg total) by mouth 2 (two) times daily with a meal. 05/06/22  Yes Breeback, [name] L, PA-C  ?Multiple Vitamins-Minerals (MULTIVITAMIN  WITH MINERALS) tablet Take 1 tablet by mouth daily.   Yes [provider]  ?nystatin (MYCOSTATIN) 100000 UNIT/ML suspension Take 5 mLs (500,000 Units total) by mouth 4 (four) times daily. ?Patient taking differently: Take 5 mLs by mouth 4 (four) times daily as needed (thrush). 12/06/21  Yes Breeback, [name] L, PA-C  ?nystatin (MYCOSTATIN/NYSTOP) powder Apply 1 application. topically 3 (three) times daily. ?Patient taking differently: Apply 1 application. topically 3 (three) times daily as needed (rash). 03/27/22  Yes Breeback, [name] L, PA-C  ?  Omega-3 Fatty Acids (FISH OIL) 1000 MG CAPS Take 1,000 mg by mouth 2 (two) times daily.   Yes [provider]  ?OVER THE COUNTER MEDICATION Apply 1 application topically at bedtime. CBD Cream   Yes [provider]  ?pioglitazone (ACTOS) 45 MG tablet Take 1 tablet (45 mg total) by mouth daily. 05/06/22  Yes Breeback, [name] L, PA-C  ?Probiotic Product (PROBIOTIC DAILY PO) Take 1 capsule by mouth daily.   Yes [provider]  ?rosuvastatin (CRESTOR) 20 MG tablet Take 1 tablet (20 mg total) by mouth daily. 05/06/22  Yes Breeback, [name] L, PA-C  ?sitaGLIPtin (JANUVIA) 100 MG tablet Take 1 tablet (100 mg total) by mouth daily. 05/06/22  Yes Breeback, [name] L, PA-C  ?traMADol (ULTRAM) 50 MG tablet Take 1 tablet (50 mg total) by mouth every 6 (six) hours as needed. 05/12/22   Aundra Dubin, PA-C  ?AMBULATORY NON FORMULARY MEDICATION Blood sugar testing strips and lancets for TrueTrack glucometer.  Use to check blood sugar up to two times a day.  Dx: Type 2 Diabetes 01/04/16   Donella Stade, PA-C  ?AMBULATORY NON FORMULARY MEDICATION Please provide needles for Tresiba pens 01/08/16   Iran Planas L, PA-C  ?blood glucose meter kit and supplies Dispense based on patient and insurance preference. Use up to four times daily as directed. (FOR ICD-10 E10.9, E11.9). 05/06/22   Breeback, [name] L, PA-C  ?glucose blood test strip Use up to 4 times per day as directed with  glucometer. Disp: 100. Refill x99 please dispense brand per insurance coverage/patient preference 08/20/21   Iran Planas L, PA-C  ?glucose blood test strip Use as instructed 01/29/22   Donella Stade, PA-C

## 2022-05-12 NOTE — Anesthesia Procedure Notes (Signed)
Procedure Name: LMA Insertion ?Date/Time: 05/12/2022 2:17 PM ?Performed by: Santa Lighter, MD ?Pre-anesthesia Checklist: Patient identified, Emergency Drugs available, Suction available and Patient being monitored ?Patient Re-evaluated:Patient Re-evaluated prior to induction ?Oxygen Delivery Method: Circle system utilized ?Preoxygenation: Pre-oxygenation with 100% oxygen ?Induction Type: IV induction ?Ventilation: Mask ventilation without difficulty ?LMA: LMA inserted ?LMA Size: 4.0 ?Laser Tube: Cuffed inflated with minimal occlusive pressure - saline ?Placement Confirmation: positive ETCO2 and breath sounds checked- equal and bilateral ?Tube secured with: Tape ?Dental Injury: Teeth and Oropharynx as per pre-operative assessment  ? ? ? ? ?

## 2022-05-12 NOTE — Anesthesia Postprocedure Evaluation (Signed)
Anesthesia Post Note ? ?Patient: Kathleen Le ? ?Procedure(s) Performed: IRRIGATION AND DEBRIDEMENT LEFT ELBOW (Left: Arm Lower) ? ?  ? ?Patient location during evaluation: PACU ?Anesthesia Type: General ?Level of consciousness: awake and alert ?Pain management: pain level controlled ?Vital Signs Assessment: post-procedure vital signs reviewed and stable ?Respiratory status: spontaneous breathing, nonlabored ventilation, respiratory function stable and patient connected to nasal cannula oxygen ?Cardiovascular status: blood pressure returned to baseline and stable ?Postop Assessment: no apparent nausea or vomiting ?Anesthetic complications: no ? ? ?No notable events documented. ? ?Last Vitals:  ?Vitals:  ? 05/12/22 1629 05/12/22 1630  ?BP:    ?Pulse: 69 71  ?Resp: 12 18  ?Temp:  36.4 ?C  ?SpO2: (!) 88% 90%  ?  ?Last Pain:  ?Vitals:  ? 05/12/22 1610  ?TempSrc:   ?PainSc: 0-No pain  ? ? ?  ?  ?  ?  ?  ?  ? ?Santa Lighter ? ? ? ? ?

## 2022-05-12 NOTE — Telephone Encounter (Signed)
Katie Warehouse manager) called about pt. Pt had surgery today and was prescribed hydrocodone and tramadol. Katie needs to verify. Please call [facility] at [phone] ?

## 2022-05-12 NOTE — Transfer of Care (Signed)
Immediate Anesthesia Transfer of Care Note ? ?Patient: Kathleen Le ? ?Procedure(s) Performed: IRRIGATION AND DEBRIDEMENT LEFT ELBOW (Left: Arm Lower) ? ?Patient Location: PACU ? ?Anesthesia Type:General ? ?Level of Consciousness: awake ? ?Airway & Oxygen Therapy: Patient Spontanous Breathing and Patient connected to face mask oxygen ? ?Post-op Assessment: Report given to RN and Post -op Vital signs reviewed and stable ? ?Post vital signs: Reviewed and stable ? ?Last Vitals:  ?Vitals Value Taken Time  ?BP 120/80   ?Temp    ?Pulse 94   ?Resp 12   ?SpO2 94   ? ? ?Last Pain:  ?Vitals:  ? 05/12/22 1216  ?TempSrc:   ?PainSc: 6   ?   ? ?Patients Stated Pain Goal: 3 (05/12/22 1216) ? ?Complications: No notable events documented. ?

## 2022-05-12 NOTE — Telephone Encounter (Signed)
Spoke to pharmacist and told them to cancel the hydrocodone

## 2022-05-13 ENCOUNTER — Other Ambulatory Visit: Payer: Self-pay

## 2022-05-13 ENCOUNTER — Encounter (HOSPITAL_COMMUNITY): Payer: Self-pay | Admitting: Orthopaedic Surgery

## 2022-05-13 ENCOUNTER — Other Ambulatory Visit: Payer: Self-pay | Admitting: Physician Assistant

## 2022-05-14 ENCOUNTER — Encounter: Payer: Self-pay | Admitting: Orthopaedic Surgery

## 2022-05-14 ENCOUNTER — Other Ambulatory Visit: Payer: Self-pay | Admitting: Orthopaedic Surgery

## 2022-05-14 MED ORDER — CIPROFLOXACIN HCL 500 MG PO TABS: 500.0000 mg | ORAL_TABLET | Freq: Two times a day (BID) | ORAL | 0 refills | Status: AC

## 2022-05-14 NOTE — Progress Notes (Signed)
Patient aware to stop Bactrim and take new antibiotic ?

## 2022-05-14 NOTE — Progress Notes (Signed)
Caryl Pina, do you mind letting her know that I've sent in cipro for her to take based on intraoperative cultures.  She should take this for 1 month.  Thanks.

## 2022-05-14 NOTE — Progress Notes (Signed)
Stop bactrim

## 2022-05-15 ENCOUNTER — Other Ambulatory Visit: Payer: Self-pay | Admitting: Physician Assistant

## 2022-05-15 DIAGNOSIS — F40240 Claustrophobia: Secondary | ICD-10-CM

## 2022-05-15 DIAGNOSIS — F5101 Primary insomnia: Secondary | ICD-10-CM

## 2022-05-15 NOTE — Telephone Encounter (Signed)
Please have her just take the cipro, NOT the bactrim that was sent in monday

## 2022-05-16 ENCOUNTER — Encounter: Payer: Self-pay | Admitting: Physician Assistant

## 2022-05-16 ENCOUNTER — Other Ambulatory Visit: Payer: Self-pay | Admitting: Physician Assistant

## 2022-05-16 DIAGNOSIS — F5101 Primary insomnia: Secondary | ICD-10-CM

## 2022-05-16 DIAGNOSIS — F40240 Claustrophobia: Secondary | ICD-10-CM

## 2022-05-16 MED ORDER — INSULIN NPH (HUMAN) (ISOPHANE) 100 UNIT/ML ~~LOC~~ SUSP: 40.0000 [IU] | Freq: Every day | SUBCUTANEOUS | 0 refills | Status: DC

## 2022-05-16 NOTE — Telephone Encounter (Signed)
Night cough medicine.  If she is still sick after the weekend were happy to evaluate her in person she has not been seen for this condition.  Recommend over-the-counter Delsym

## 2022-05-17 LAB — AEROBIC/ANAEROBIC CULTURE W GRAM STAIN (SURGICAL/DEEP WOUND)
Culture: NO GROWTH
Culture: NO GROWTH
Gram Stain: NONE SEEN
Gram Stain: NONE SEEN

## 2022-05-20 ENCOUNTER — Encounter: Payer: Self-pay | Admitting: Orthopaedic Surgery

## 2022-05-20 ENCOUNTER — Ambulatory Visit (INDEPENDENT_AMBULATORY_CARE_PROVIDER_SITE_OTHER): Payer: 59 | Admitting: Physician Assistant

## 2022-05-20 DIAGNOSIS — S51002A Unspecified open wound of left elbow, initial encounter: Secondary | ICD-10-CM

## 2022-05-20 NOTE — Addendum Note (Signed)
Addended by: Minda Ditto, Alyse Low N on: 05/20/2022 11:41 AM   Modules accepted: Orders

## 2022-05-20 NOTE — Progress Notes (Signed)
Post-Op Visit Note   Patient: Kathleen Le           Date of Birth: 1961/02/25           MRN: [id number] Visit Date: 05/20/2022 PCP: Donella Stade, PA-C   Assessment & Plan:  Chief Complaint:  Chief Complaint  Patient presents with   Left Elbow - Follow-up    Left elbow I&D 05/12/2022   Visit Diagnoses:  1. Elbow wound, left, initial encounter     Plan: Patient is a pleasant 61 year old female who comes in today 1 week status post left elbow debridement and I&D.  Intraoperative cultures grew gram-negative rods.  She was placed on Cipro for which she has been compliant taking.  She denies any fevers or chills.  She has been in a long-arm splint.  Examination of the elbow without the splint reveals a well-healing surgical incision with nylon sutures in place.  No erythema, induration or drainage to suggest cellulitis.  Fingers are warm well perfused.  She is neurovascular tact distally.  Today, the wound was covered with a dry dressing and Ace bandage.  She will continue with her Cipro.  We will make referral to infectious disease for further management.  Follow-up next week for suture removal.  Call with concerns or questions.  Follow-Up Instructions: Return in about 1 week (around 05/27/2022).   Orders:  No orders of the defined types were placed in this encounter.  No orders of the defined types were placed in this encounter.   Imaging: No new imaging  PMFS History: Patient Active Problem List   Diagnosis Date Noted   Elbow wound, left, initial encounter 05/11/2022   Centrilobular emphysema ([place]) 05/09/2022   Triceps tendon rupture, left, initial encounter 02/07/2022   Fall 12/31/2021   Contusion of left arm 12/31/2021   Acute left-sided low back pain without sciatica 12/31/2021   Neck pain 12/31/2021   Encounter for smoking cessation counseling 11/15/2021   Chronic obstructive pulmonary disease ([place]) 02/26/2021   Hyperlipidemia associated with type 2 diabetes  mellitus ([place]) 11/05/2020   Non-restorative sleep 11/05/2020   Snoring 11/05/2020   Anterolisthesis 07/30/2020   Acute stress reaction 09/12/2018   Current smoker 06/08/2018   Diabetes mellitus, type II, insulin dependent ([place]) 06/08/2018   Claustrophobia 06/08/2018   Primary insomnia 06/08/2018   Hot flashes 06/08/2018   Abnormal weight gain 06/08/2018   Lower leg edema 11/10/2017   Insomnia due to other mental disorder 10/27/2017   History of substance abuse ([place]) 10/27/2017   GAD (generalized anxiety disorder) 10/27/2017   Class 3 severe obesity due to excess calories with serious comorbidity and body mass index (BMI) of 40.0 to 44.9 in adult ([place]) 08/31/2017   Cerumen impaction 01/24/2015   Hyperlipidemia 01/24/2015   Gastroesophageal reflux disease with esophagitis 01/10/2015   Foot callus 10/03/2014   PTSD (post-traumatic stress disorder) 09/26/2014   Depression 05/19/2014   Anxiety 05/19/2014   Chronic bilateral low back pain with right-sided sciatica 05/19/2014   DDD (degenerative disc disease), lumbar 05/19/2014   Neuropathy 02/05/2014   IT band syndrome 02/05/2014   Sinus tachycardia 02/05/2014   Memory changes 02/05/2014   Word finding difficulty 02/05/2014   Cervical cancer ([place]) 02/05/2014   Type II diabetes mellitus, uncontrolled 02/03/2014   Unspecified asthma(493.90) 02/03/2014   Essential hypertension, benign 02/03/2014   Pulmonary emphysema ([place]) 07/26/2012   Past Medical History:  Diagnosis Date   Anxiety    Arthritis    Asthma  Cancer ([facility])    COPD (chronic obstructive pulmonary disease) ([facility])    Degenerative disc disease, lumbar    Diabetes mellitus without complication ([facility])    Dyspnea    with exertion due to COPD   GERD (gastroesophageal reflux disease)    History of alcohol abuse    20-30 years ago, patient was going through divorce but does not currently drink any alcohol   History of marijuana use    "its been a long time since I last  smoked because of my COPD"   History of ovarian cancer 1984   Hypertension    Pneumonia    Substance abuse ([place])    patient denies, states she has only ever used marijuana    Family History  Problem Relation Age of Onset   Cancer Paternal Grandmother    Diabetes Paternal Grandmother    Diabetes Paternal Grandfather    Heart attack Maternal Aunt    Cancer Maternal Uncle    Cancer Maternal Grandmother    Heart attack Mother    Cancer Mother    Stroke Mother    Diabetes Father    Heart attack Father    Diabetes Paternal Uncle    Diabetes Paternal Uncle     Past Surgical History:  Procedure Laterality Date   DILATION AND CURETTAGE OF UTERUS     ECTOPIC PREGNANCY SURGERY     patient stated she had to have tube removed   I & D EXTREMITY Left 05/12/2022   Procedure: IRRIGATION AND DEBRIDEMENT LEFT ELBOW;  Surgeon: Leandrew Koyanagi, MD;  Location: [facility];  Service: Orthopedics;  Laterality: Left;   TONSILLECTOMY     TRICEPS TENDON REPAIR Left 02/07/2022   Procedure: left triceps repair;  Surgeon: Leandrew Koyanagi, MD;  Location: [facility];  Service: Orthopedics;  Laterality: Left;   Social History   Occupational History   Not on file  Tobacco Use   Smoking status: Every Day    Packs/day: 1.00    Types: Cigarettes   Smokeless tobacco: Never   Tobacco comments:    1 pack a day   Vaping Use   Vaping Use: Never used  Substance and Sexual Activity   Alcohol use: Not Currently   Drug use: Not Currently    Types: Marijuana    Comment: no longer smokes marijuana due to COPD   Sexual activity: Yes

## 2022-05-21 ENCOUNTER — Encounter: Payer: Self-pay | Admitting: Orthopaedic Surgery

## 2022-05-28 ENCOUNTER — Ambulatory Visit (INDEPENDENT_AMBULATORY_CARE_PROVIDER_SITE_OTHER): Payer: 59 | Admitting: Orthopaedic Surgery

## 2022-05-28 ENCOUNTER — Other Ambulatory Visit: Payer: Self-pay

## 2022-05-28 ENCOUNTER — Encounter: Payer: Self-pay | Admitting: Internal Medicine

## 2022-05-28 ENCOUNTER — Encounter: Payer: Self-pay | Admitting: Orthopaedic Surgery

## 2022-05-28 ENCOUNTER — Ambulatory Visit: Payer: 59 | Admitting: Internal Medicine

## 2022-05-28 DIAGNOSIS — M7030 Other bursitis of elbow, unspecified elbow: Secondary | ICD-10-CM

## 2022-05-28 DIAGNOSIS — S51002A Unspecified open wound of left elbow, initial encounter: Secondary | ICD-10-CM

## 2022-05-28 DIAGNOSIS — M7022 Olecranon bursitis, left elbow: Secondary | ICD-10-CM | POA: Diagnosis not present

## 2022-05-28 DIAGNOSIS — S46312A Strain of muscle, fascia and tendon of triceps, left arm, initial encounter: Secondary | ICD-10-CM

## 2022-05-28 HISTORY — DX: Other bursitis of elbow, unspecified elbow: M70.30

## 2022-05-28 NOTE — Progress Notes (Signed)
Post-Op Visit Note   Patient: Kathleen Le           Date of Birth: [birth year]           MRN: [id number] Visit Date: 05/28/2022 PCP: Donella Stade, PA-C   Assessment & Plan:  Chief Complaint:  Chief Complaint  Patient presents with   Left Elbow - Routine Post Op   Visit Diagnoses:  1. Elbow wound, left, initial encounter   2. Triceps tendon rupture, left, initial encounter     Plan: Kathleen Le is 2 weeks status post left elbow I&D.  She is doing well has no complaints.  She is currently on Cipro 500 mg twice daily.  She has an appointment to see infectious disease today.  Elbow examination is benign.  She has essentially full range of motion.  She has no pain.  At this point we will remove her sutures in place Steri-Strips.  We will have ID determine how long she needs to be on the Cipro.  From my standpoint she can see [place] back as needed  Follow-Up Instructions: No follow-ups on file.   Orders:  No orders of the defined types were placed in this encounter.  No orders of the defined types were placed in this encounter.   Imaging: No results found.  PMFS History: Patient Active Problem List   Diagnosis Date Noted   Elbow wound, left, initial encounter 05/11/2022   Centrilobular emphysema ([place]) 05/09/2022   Triceps tendon rupture, left, initial encounter 02/07/2022   Fall 12/31/2021   Contusion of left arm 12/31/2021   Acute left-sided low back pain without sciatica 12/31/2021   Neck pain 12/31/2021   Encounter for smoking cessation counseling 11/15/2021   Chronic obstructive pulmonary disease ([place]) 02/26/2021   Hyperlipidemia associated with type 2 diabetes mellitus ([place]) 11/05/2020   Non-restorative sleep 11/05/2020   Snoring 11/05/2020   Anterolisthesis 07/30/2020   Acute stress reaction 09/12/2018   Current smoker 06/08/2018   Diabetes mellitus, type II, insulin dependent ([place]) 06/08/2018   Claustrophobia 06/08/2018   Primary insomnia 06/08/2018   Hot flashes  06/08/2018   Abnormal weight gain 06/08/2018   Lower leg edema 11/10/2017   Insomnia due to other mental disorder 10/27/2017   History of substance abuse ([place]) 10/27/2017   GAD (generalized anxiety disorder) 10/27/2017   Class 3 severe obesity due to excess calories with serious comorbidity and body mass index (BMI) of 40.0 to 44.9 in adult ([place]) 08/31/2017   Cerumen impaction 01/24/2015   Hyperlipidemia 01/24/2015   Gastroesophageal reflux disease with esophagitis 01/10/2015   Foot callus 10/03/2014   PTSD (post-traumatic stress disorder) 09/26/2014   Depression 05/19/2014   Anxiety 05/19/2014   Chronic bilateral low back pain with right-sided sciatica 05/19/2014   DDD (degenerative disc disease), lumbar 05/19/2014   Neuropathy 02/05/2014   IT band syndrome 02/05/2014   Sinus tachycardia 02/05/2014   Memory changes 02/05/2014   Word finding difficulty 02/05/2014   Cervical cancer ([place]) 02/05/2014   Type II diabetes mellitus, uncontrolled 02/03/2014   Unspecified asthma(493.90) 02/03/2014   Essential hypertension, benign 02/03/2014   Pulmonary emphysema ([place]) 07/26/2012   Past Medical History:  Diagnosis Date   Anxiety    Arthritis    Asthma    Cancer ([place])    COPD (chronic obstructive pulmonary disease) ([place])    Degenerative disc disease, lumbar    Diabetes mellitus without complication ([place])    Dyspnea    with exertion due to COPD   GERD (gastroesophageal  reflux disease)    History of alcohol abuse    20-30 years ago, patient was going through divorce but does not currently drink any alcohol   History of marijuana use    "its been a long time since I last smoked because of my COPD"   History of ovarian cancer 1984   Hypertension    Pneumonia    Substance abuse ([place])    patient denies, states she has only ever used marijuana    Family History  Problem Relation Age of Onset   Cancer Paternal Grandmother    Diabetes Paternal Grandmother    Diabetes Paternal  Grandfather    Heart attack Maternal Aunt    Cancer Maternal Uncle    Cancer Maternal Grandmother    Heart attack Mother    Cancer Mother    Stroke Mother    Diabetes Father    Heart attack Father    Diabetes Paternal Uncle    Diabetes Paternal Uncle     Past Surgical History:  Procedure Laterality Date   DILATION AND CURETTAGE OF UTERUS     ECTOPIC PREGNANCY SURGERY     patient stated she had to have tube removed   I & D EXTREMITY Left 05/12/2022   Procedure: IRRIGATION AND DEBRIDEMENT LEFT ELBOW;  Surgeon: Leandrew Koyanagi, MD;  Location: [facility];  Service: Orthopedics;  Laterality: Left;   TONSILLECTOMY     TRICEPS TENDON REPAIR Left 02/07/2022   Procedure: left triceps repair;  Surgeon: Leandrew Koyanagi, MD;  Location: [facility];  Service: Orthopedics;  Laterality: Left;   Social History   Occupational History   Not on file  Tobacco Use   Smoking status: Every Day    Packs/day: 1.00    Types: Cigarettes   Smokeless tobacco: Never   Tobacco comments:    1 pack a day   Vaping Use   Vaping Use: Never used  Substance and Sexual Activity   Alcohol use: Not Currently   Drug use: Not Currently    Types: Marijuana    Comment: no longer smokes marijuana due to COPD   Sexual activity: Yes

## 2022-05-28 NOTE — Progress Notes (Signed)
[facility] for Infectious Disease      Reason for Consult: elbow infection    Referring Physician: Dr. Erlinda Hong    Patient ID: Kathleen Le, female    DOB: 06-May-1961, 61 y.o.   MRN: [id number]  HPI:   She is here for evaluation of treatment for an elbow infection.  She developed a tricep tendon rupture of the left arm and had a tricep repair done on 02/07/22 after MRI revealed a complete tear of the distal triceps tendon from its insertion on the olecranon process.  She had continues drainage from the bursa with clear fluid and was treated with antibiotic and went back to the OR on 5/15 for I and D.  She was initially on Bactrim and changed to cipro after the gram stain came back with GNR in one of the cultures.  No growth in any culture.  She is improving with good ROM and no longer following up with Dr. Erlinda Hong after seeing him today.  Sutures removed and steri strips placed.     Past Medical History:  Diagnosis Date   Anxiety    Arthritis    Asthma    Cancer ([place])    COPD (chronic obstructive pulmonary disease) ([place])    Degenerative disc disease, lumbar    Diabetes mellitus without complication ([facility])    Dyspnea    with exertion due to COPD   GERD (gastroesophageal reflux disease)    History of alcohol abuse    20-30 years ago, patient was going through divorce but does not currently drink any alcohol   History of marijuana use    "its been a long time since I last smoked because of my COPD"   History of ovarian cancer 1984   Hypertension    Pneumonia    Substance abuse ([place])    patient denies, states she has only ever used marijuana    Prior to Admission medications   Medication Sig Start Date End Date Taking? Authorizing Provider  albuterol (VENTOLIN HFA) 108 (90 Base) MCG/ACT inhaler Inhale 1-2 puffs into the lungs every 6 (six) hours as needed for wheezing or shortness of breath. 02/13/22   Breeback, [name] L, PA-C  AMBULATORY NON FORMULARY MEDICATION Blood sugar testing  strips and lancets for TrueTrack glucometer.  Use to check blood sugar up to two times a day.  Dx: Type 2 Diabetes 01/04/16   Donella Stade, PA-C  AMBULATORY NON FORMULARY MEDICATION Please provide needles for Tresiba pens 01/08/16   Breeback, [name] L, PA-C  amLODipine (NORVASC) 5 MG tablet Take 1 tablet (5 mg total) by mouth daily. 05/06/22   Donella Stade, PA-C  aspirin 81 MG EC tablet Take 81 mg by mouth daily.    [provider]  atenolol (TENORMIN) 50 MG tablet Take 1 tablet (50 mg total) by mouth daily. 05/06/22   Breeback, [name] L, PA-C  blood glucose meter kit and supplies Dispense based on patient and insurance preference. Use up to four times daily as directed. (FOR ICD-10 E10.9, E11.9). 05/06/22   Breeback, Royetta Car, PA-C  Calcium Carbonate (CALCARB 600 PO) Take 600 mg by mouth daily.    [provider]  ciprofloxacin (CIPRO) 500 MG tablet Take 1 tablet (500 mg total) by mouth 2 (two) times daily. 05/14/22 06/13/22  Leandrew Koyanagi, MD  doxepin (SINEQUAN) 25 MG capsule Take 1-2 capsules (25-50 mg total) by mouth at bedtime as needed (sleep). 05/16/22   Breeback, Royetta Car, PA-C  DULoxetine (  CYMBALTA) 30 MG capsule Take 1 capsule by mouth twice daily Patient taking differently: 30 mg 2 (two) times daily. 03/14/22   Breeback, [name] L, PA-C  esomeprazole (NEXIUM) 20 MG capsule Take 20 mg by mouth daily.    [provider]  Flaxseed, Linseed, (FLAX SEED OIL) 1000 MG CAPS Take 1,000 mg by mouth daily.    [provider]  Fluticasone-Umeclidin-Vilant (TRELEGY ELLIPTA) 100-62.5-25 MCG/ACT AEPB Inhale 1 puff into the lungs daily. 03/04/22   Breeback, [name] L, PA-C  glipiZIDE (GLUCOTROL) 10 MG tablet Take 1 tablet (10 mg total) by mouth 2 (two) times daily before a meal. 05/06/22   Breeback, [name] L, PA-C  glucose blood test strip Use up to 4 times per day as directed with glucometer. Disp: 100. Refill x99 please dispense brand per insurance coverage/patient preference 08/20/21    Iran Planas L, PA-C  glucose blood test strip Use as instructed 01/29/22   Breeback, [name] L, PA-C  hydrochlorothiazide (HYDRODIURIL) 25 MG tablet Take 1 tablet by mouth once daily Patient taking differently: Take 25 mg by mouth daily. 03/14/22   Breeback, Royetta Car, PA-C  HYDROcodone-acetaminophen (NORCO) 7.5-325 MG tablet Take 1-2 tablets by mouth daily as needed for moderate pain. 05/12/22   Leandrew Koyanagi, MD  ibuprofen (ADVIL) 600 MG tablet Take 1 tablet (600 mg total) by mouth every 6 (six) hours as needed. Patient taking differently: Take 600 mg by mouth every 6 (six) hours as needed for moderate pain. 12/25/21   Melynda Ripple, MD  insulin NPH Human (NOVOLIN N RELION) 100 UNIT/ML injection Inject 0.4 mLs (40 Units total) into the skin at bedtime. 05/16/22   Breeback, [name] L, PA-C  Insulin Syringe-Needle U-100 (INSULIN SYRINGE .3CC/31GX5/16") 31G X 5/16" 0.3 ML MISC Dx DM E11.9. Inject insulin daily at bedtime. 06/30/21   Breeback, [name] L, PA-C  ipratropium-albuterol (DUONEB) 0.5-2.5 (3) MG/3ML SOLN Take 3 mLs by nebulization every 6 (six) hours as needed. 03/04/22   Breeback, [name] L, PA-C  Lancets 30G MISC Check blood sugar up to 4 times a day. 08/20/21   Breeback, [name] L, PA-C  lisinopril (ZESTRIL) 5 MG tablet Take 1 tablet by mouth once daily Patient taking differently: Take 5 mg by mouth daily. 02/28/22   Breeback, [name] L, PA-C  magnesium gluconate (MAGONATE) 500 MG tablet Take 500 mg by mouth daily.    [provider]  meloxicam (MOBIC) 15 MG tablet Take 1 tablet by mouth once daily Patient taking differently: Take 15 mg by mouth daily. 02/28/22   Breeback, Royetta Car, PA-C  metFORMIN (GLUCOPHAGE) 1000 MG tablet Take 1 tablet (1,000 mg total) by mouth 2 (two) times daily with a meal. 05/06/22   Breeback, [name] L, PA-C  Multiple Vitamins-Minerals (MULTIVITAMIN WITH MINERALS) tablet Take 1 tablet by mouth daily.    [provider]  nystatin (MYCOSTATIN) 100000 UNIT/ML suspension Take 5 mLs  (500,000 Units total) by mouth 4 (four) times daily. Patient taking differently: Take 5 mLs by mouth 4 (four) times daily as needed (thrush). 12/06/21   Breeback, [name] L, PA-C  nystatin (MYCOSTATIN/NYSTOP) powder Apply 1 application. topically 3 (three) times daily. Patient taking differently: Apply 1 application. topically 3 (three) times daily as needed (rash). 03/27/22   Breeback, [name] L, PA-C  Omega-3 Fatty Acids (FISH OIL) 1000 MG CAPS Take 1,000 mg by mouth 2 (two) times daily.    [provider]  OVER THE COUNTER MEDICATION Apply 1 application topically at bedtime. CBD Cream  [provider]  pioglitazone (ACTOS) 45 MG tablet Take 1 tablet (45 mg total) by mouth daily. 05/06/22   Breeback, Royetta Car, PA-C  Probiotic Product (PROBIOTIC DAILY PO) Take 1 capsule by mouth daily.    [provider]  rosuvastatin (CRESTOR) 20 MG tablet Take 1 tablet (20 mg total) by mouth daily. 05/06/22   Breeback, [name] L, PA-C  sitaGLIPtin (JANUVIA) 100 MG tablet Take 1 tablet (100 mg total) by mouth daily. 05/06/22   Breeback, [name] L, PA-C  sulfamethoxazole-trimethoprim (BACTRIM DS) 800-160 MG tablet Take 1 tablet by mouth 2 (two) times daily. 05/12/22   Leandrew Koyanagi, MD  traMADol (ULTRAM) 50 MG tablet Take 1 tablet (50 mg total) by mouth every 6 (six) hours as needed. 05/12/22   Aundra Dubin, PA-C    Allergies  Allergen Reactions   Gabapentin Nausea And Vomiting   Invokana [Canagliflozin] Other (See Comments)    Vaginitis    Lyrica [Pregabalin] Other (See Comments)    Pain in feet increased and spasm    Social History   Tobacco Use   Smoking status: Every Day    Packs/day: 1.00    Types: Cigarettes   Smokeless tobacco: Never   Tobacco comments:    1 pack a day   Vaping Use   Vaping Use: Never used  Substance Use Topics   Alcohol use: Not Currently   Drug use: Not Currently    Types: Marijuana    Comment: no longer smokes marijuana due to COPD    Family History   Problem Relation Age of Onset   Cancer Paternal Grandmother    Diabetes Paternal Grandmother    Diabetes Paternal Grandfather    Heart attack Maternal Aunt    Cancer Maternal Uncle    Cancer Maternal Grandmother    Heart attack Mother    Cancer Mother    Stroke Mother    Diabetes Father    Heart attack Father    Diabetes Paternal Uncle    Diabetes Paternal Uncle     Review of Systems  Constitutional: negative for fevers and chills Gastrointestinal: negative for diarrhea All other systems reviewed and are negative    Constitutional: in no apparent distress There were no vitals filed for this visit. EYES: anicteric Respiratory: normal respiratory effort Musculoskeletal: left elbow with full range of motion, no drainage, steri strips in place and no warmth or erythema.  Labs: Lab Results  Component Value Date   WBC 4.9 05/06/2022   HGB 13.3 05/06/2022   HCT 40.9 05/06/2022   MCV 91.7 05/06/2022   PLT 266 05/06/2022    Lab Results  Component Value Date   CREATININE 0.83 05/06/2022   BUN 18 05/06/2022   NA 140 05/06/2022   K 3.9 05/06/2022   CL 103 05/06/2022   CO2 26 05/06/2022    Lab Results  Component Value Date   ALT 17 05/06/2022   AST 17 05/06/2022   ALKPHOS 85 08/26/2017   BILITOT 0.4 05/06/2022     Assessment: left elbow bursitis following surgical repair of triceps rupture.   At this point, it appears stable, healing well, no drainage or signs of infection. I will have her continue with cipro 7 more days and stop and observe off of antibiotics.    Plan: 1)  cipro 7 days more through next Tuesday 2) bmp today on cipro  Follow up as needed

## 2022-05-28 NOTE — Patient Instructions (Signed)
Continue with Cipro through Tuesday June 6th and then stop.

## 2022-05-29 LAB — BASIC METABOLIC PANEL
BUN: 24 mg/dL (ref 7–25)
CO2: 28 mmol/L (ref 20–32)
Calcium: 9.1 mg/dL (ref 8.6–10.4)
Chloride: 101 mmol/L (ref 98–110)
Creat: 1.02 mg/dL (ref 0.50–1.03)
Glucose, Bld: 335 mg/dL — ABNORMAL HIGH (ref 65–99)
Potassium: 4.1 mmol/L (ref 3.5–5.3)
Sodium: 138 mmol/L (ref 135–146)

## 2022-05-30 ENCOUNTER — Other Ambulatory Visit: Payer: Self-pay | Admitting: Physician Assistant

## 2022-05-30 DIAGNOSIS — E119 Type 2 diabetes mellitus without complications: Secondary | ICD-10-CM

## 2022-06-09 ENCOUNTER — Encounter: Payer: Self-pay | Admitting: Internal Medicine

## 2022-06-09 ENCOUNTER — Encounter: Payer: Self-pay | Admitting: Orthopaedic Surgery

## 2022-06-10 NOTE — Telephone Encounter (Signed)
Lets have her come in.  Looks like she finished abx per ID on 6/6.  Wondering if it started draining after

## 2022-06-11 ENCOUNTER — Ambulatory Visit (INDEPENDENT_AMBULATORY_CARE_PROVIDER_SITE_OTHER): Payer: 59 | Admitting: Physician Assistant

## 2022-06-11 DIAGNOSIS — S51002A Unspecified open wound of left elbow, initial encounter: Secondary | ICD-10-CM

## 2022-06-11 MED ORDER — CIPROFLOXACIN HCL 500 MG PO TABS: 500.0000 mg | ORAL_TABLET | Freq: Two times a day (BID) | ORAL | 0 refills | Status: AC

## 2022-06-11 NOTE — Progress Notes (Signed)
Post-Op Visit Note   Patient: Kathleen Le           Date of Birth: 05-28-1961           MRN: [id number] Visit Date: 06/11/2022 PCP: Donella Stade, PA-C   Assessment & Plan:  Chief Complaint:  Chief Complaint  Patient presents with   Left Elbow - Follow-up   Visit Diagnoses:  1. Elbow wound, left, initial encounter     Plan: Patient comes in approximately 4 weeks status post left elbow I&D intraoperative cultures grew out gram-negative rods.  She was initially on Bactrim and was switched to Cipro once the cultures finalized.  She has been seen by infectious disease and was instructed to stop the Cipro on 06/03/2022.  Patient notes that she has been having drainage to the left elbow which started soon after suture removal on 05/28/2022.  She brought in a small container of the drainage which is very similar to what it was prior to surgery.  She denies any fevers or chills.  Minimal warmth to the elbow.  Examination of the left elbow reveals slight serous drainage to the middle of the incision.  No tenderness, erythema, warmth, cellulitis.  Full range of motion of the elbow without pain.  At this point, the wound does appear to have dehisced.  I would like to restart her Cipro 500 mg twice daily.  We will apply dry bandage today.  Continue with dry bandages and follow-up with [place] next Tuesday when Dr. Sherrian Divers is back in town.  Should she develop any fevers or chills or worsening symptoms she will call.    Follow-Up Instructions: Return in about 6 days (around 06/17/2022).   Orders:  No orders of the defined types were placed in this encounter.  No orders of the defined types were placed in this encounter.   Imaging: No new imaging  PMFS History: Patient Active Problem List   Diagnosis Date Noted   Bursitis of elbow 05/28/2022   Elbow wound, left, initial encounter 05/11/2022   Centrilobular emphysema ([place]) 05/09/2022   Triceps tendon rupture, left, initial encounter 02/07/2022    Fall 12/31/2021   Contusion of left arm 12/31/2021   Acute left-sided low back pain without sciatica 12/31/2021   Neck pain 12/31/2021   Encounter for smoking cessation counseling 11/15/2021   Chronic obstructive pulmonary disease ([place]) 02/26/2021   Hyperlipidemia associated with type 2 diabetes mellitus ([place]) 11/05/2020   Non-restorative sleep 11/05/2020   Snoring 11/05/2020   Anterolisthesis 07/30/2020   Acute stress reaction 09/12/2018   Current smoker 06/08/2018   Diabetes mellitus, type II, insulin dependent ([place]) 06/08/2018   Claustrophobia 06/08/2018   Primary insomnia 06/08/2018   Hot flashes 06/08/2018   Abnormal weight gain 06/08/2018   Lower leg edema 11/10/2017   Insomnia due to other mental disorder 10/27/2017   History of substance abuse ([place]) 10/27/2017   GAD (generalized anxiety disorder) 10/27/2017   Class 3 severe obesity due to excess calories with serious comorbidity and body mass index (BMI) of 40.0 to 44.9 in adult ([place]) 08/31/2017   Cerumen impaction 01/24/2015   Hyperlipidemia 01/24/2015   Gastroesophageal reflux disease with esophagitis 01/10/2015   Foot callus 10/03/2014   PTSD (post-traumatic stress disorder) 09/26/2014   Depression 05/19/2014   Anxiety 05/19/2014   Chronic bilateral low back pain with right-sided sciatica 05/19/2014   DDD (degenerative disc disease), lumbar 05/19/2014   Neuropathy 02/05/2014   IT band syndrome 02/05/2014   Sinus tachycardia 02/05/2014  Memory changes 02/05/2014   Word finding difficulty 02/05/2014   Cervical cancer ([place]) 02/05/2014   Type II diabetes mellitus, uncontrolled 02/03/2014   Unspecified asthma(493.90) 02/03/2014   Essential hypertension, benign 02/03/2014   Pulmonary emphysema ([place]) 07/26/2012   Past Medical History:  Diagnosis Date   Anxiety    Arthritis    Asthma    Cancer ([place])    COPD (chronic obstructive pulmonary disease) ([place])    Degenerative disc disease, lumbar    Diabetes mellitus  without complication ([place])    Dyspnea    with exertion due to COPD   GERD (gastroesophageal reflux disease)    History of alcohol abuse    20-30 years ago, patient was going through divorce but does not currently drink any alcohol   History of marijuana use    "its been a long time since I last smoked because of my COPD"   History of ovarian cancer 1984   Hypertension    Pneumonia    Substance abuse ([place])    patient denies, states she has only ever used marijuana    Family History  Problem Relation Age of Onset   Cancer Paternal Grandmother    Diabetes Paternal Grandmother    Diabetes Paternal Grandfather    Heart attack Maternal Aunt    Cancer Maternal Uncle    Cancer Maternal Grandmother    Heart attack Mother    Cancer Mother    Stroke Mother    Diabetes Father    Heart attack Father    Diabetes Paternal Uncle    Diabetes Paternal Uncle     Past Surgical History:  Procedure Laterality Date   DILATION AND CURETTAGE OF UTERUS     ECTOPIC PREGNANCY SURGERY     patient stated she had to have tube removed   I & D EXTREMITY Left 05/12/2022   Procedure: IRRIGATION AND DEBRIDEMENT LEFT ELBOW;  Surgeon: Leandrew Koyanagi, MD;  Location: [facility];  Service: Orthopedics;  Laterality: Left;   TONSILLECTOMY     TRICEPS TENDON REPAIR Left 02/07/2022   Procedure: left triceps repair;  Surgeon: Leandrew Koyanagi, MD;  Location: [facility];  Service: Orthopedics;  Laterality: Left;   Social History   Occupational History   Not on file  Tobacco Use   Smoking status: Every Day    Packs/day: 1.00    Types: Cigarettes   Smokeless tobacco: Never   Tobacco comments:    1 pack a day   Vaping Use   Vaping Use: Never used  Substance and Sexual Activity   Alcohol use: Not Currently   Drug use: Not Currently    Types: Marijuana    Comment: no longer smokes marijuana due to COPD   Sexual activity: Yes

## 2022-06-13 ENCOUNTER — Other Ambulatory Visit: Payer: Self-pay | Admitting: Physician Assistant

## 2022-06-13 DIAGNOSIS — I10 Essential (primary) hypertension: Secondary | ICD-10-CM

## 2022-06-15 ENCOUNTER — Other Ambulatory Visit: Payer: Self-pay | Admitting: Physician Assistant

## 2022-06-17 ENCOUNTER — Ambulatory Visit (INDEPENDENT_AMBULATORY_CARE_PROVIDER_SITE_OTHER): Payer: 59 | Admitting: Orthopaedic Surgery

## 2022-06-17 DIAGNOSIS — S51002A Unspecified open wound of left elbow, initial encounter: Secondary | ICD-10-CM

## 2022-06-17 MED ORDER — DIAZEPAM 5 MG PO TABS
5.0000 mg | ORAL_TABLET | Freq: Four times a day (QID) | ORAL | 0 refills | Status: DC | PRN
Start: 2022-06-17 — End: 2022-07-11

## 2022-06-17 NOTE — Progress Notes (Signed)
Post-Op Visit Note   Patient: Kathleen Le           Date of Birth: 08-25-1961           MRN: [id number] Visit Date: 06/17/2022 PCP: Donella Stade, PA-C   Assessment & Plan:  Chief Complaint:  Chief Complaint  Patient presents with   Left Elbow - Routine Post Op   Visit Diagnoses:  1. Elbow wound, left, initial encounter     Plan: Kathleen Le is a work in today for drainage from the left elbow.  Pieces of white tissue are coming out of the wound again.  She completed course of Cipro per ID recommendations.  Examination of left elbow shows a pinpoint site with scant serous drainage.  No tenderness palpation.  No fluctuance or cellulitis.  Painless passive range of motion of the elbow.  At this point I am concerned that she may have deeper infection.  We will obtain MRI to rule out osteomyelitis.  Follow-up after the MRI.  Strongly encourage patient to better control diabetes and on smoking cessation.  Follow-Up Instructions: No follow-ups on file.   Orders:  No orders of the defined types were placed in this encounter.  No orders of the defined types were placed in this encounter.   Imaging: No results found.  PMFS History: Patient Active Problem List   Diagnosis Date Noted   Bursitis of elbow 05/28/2022   Elbow wound, left, initial encounter 05/11/2022   Centrilobular emphysema ([place]) 05/09/2022   Triceps tendon rupture, left, initial encounter 02/07/2022   Fall 12/31/2021   Contusion of left arm 12/31/2021   Acute left-sided low back pain without sciatica 12/31/2021   Neck pain 12/31/2021   Encounter for smoking cessation counseling 11/15/2021   Chronic obstructive pulmonary disease ([place]) 02/26/2021   Hyperlipidemia associated with type 2 diabetes mellitus ([place]) 11/05/2020   Non-restorative sleep 11/05/2020   Snoring 11/05/2020   Anterolisthesis 07/30/2020   Acute stress reaction 09/12/2018   Current smoker 06/08/2018   Diabetes mellitus, type II, insulin  dependent ([place]) 06/08/2018   Claustrophobia 06/08/2018   Primary insomnia 06/08/2018   Hot flashes 06/08/2018   Abnormal weight gain 06/08/2018   Lower leg edema 11/10/2017   Insomnia due to other mental disorder 10/27/2017   History of substance abuse ([place]) 10/27/2017   GAD (generalized anxiety disorder) 10/27/2017   Class 3 severe obesity due to excess calories with serious comorbidity and body mass index (BMI) of 40.0 to 44.9 in adult ([place]) 08/31/2017   Cerumen impaction 01/24/2015   Hyperlipidemia 01/24/2015   Gastroesophageal reflux disease with esophagitis 01/10/2015   Foot callus 10/03/2014   PTSD (post-traumatic stress disorder) 09/26/2014   Depression 05/19/2014   Anxiety 05/19/2014   Chronic bilateral low back pain with right-sided sciatica 05/19/2014   DDD (degenerative disc disease), lumbar 05/19/2014   Neuropathy 02/05/2014   IT band syndrome 02/05/2014   Sinus tachycardia 02/05/2014   Memory changes 02/05/2014   Word finding difficulty 02/05/2014   Cervical cancer ([place]) 02/05/2014   Type II diabetes mellitus, uncontrolled 02/03/2014   Unspecified asthma(493.90) 02/03/2014   Essential hypertension, benign 02/03/2014   Pulmonary emphysema ([place]) 07/26/2012   Past Medical History:  Diagnosis Date   Anxiety    Arthritis    Asthma    Cancer ([place])    COPD (chronic obstructive pulmonary disease) ([place])    Degenerative disc disease, lumbar    Diabetes mellitus without complication ([place])    Dyspnea    with exertion  due to COPD   GERD (gastroesophageal reflux disease)    History of alcohol abuse    20-30 years ago, patient was going through divorce but does not currently drink any alcohol   History of marijuana use    "its been a long time since I last smoked because of my COPD"   History of ovarian cancer 1984   Hypertension    Pneumonia    Substance abuse ([place])    patient denies, states she has only ever used marijuana    Family History  Problem Relation Age  of Onset   Cancer Paternal Grandmother    Diabetes Paternal Grandmother    Diabetes Paternal Grandfather    Heart attack Maternal Aunt    Cancer Maternal Uncle    Cancer Maternal Grandmother    Heart attack Mother    Cancer Mother    Stroke Mother    Diabetes Father    Heart attack Father    Diabetes Paternal Uncle    Diabetes Paternal Uncle     Past Surgical History:  Procedure Laterality Date   DILATION AND CURETTAGE OF UTERUS     ECTOPIC PREGNANCY SURGERY     patient stated she had to have tube removed   I & D EXTREMITY Left 05/12/2022   Procedure: IRRIGATION AND DEBRIDEMENT LEFT ELBOW;  Surgeon: Leandrew Koyanagi, MD;  Location: [facility];  Service: Orthopedics;  Laterality: Left;   TONSILLECTOMY     TRICEPS TENDON REPAIR Left 02/07/2022   Procedure: left triceps repair;  Surgeon: Leandrew Koyanagi, MD;  Location: [facility];  Service: Orthopedics;  Laterality: Left;   Social History   Occupational History   Not on file  Tobacco Use   Smoking status: Every Day    Packs/day: 1.00    Types: Cigarettes   Smokeless tobacco: Never   Tobacco comments:    1 pack a day   Vaping Use   Vaping Use: Never used  Substance and Sexual Activity   Alcohol use: Not Currently   Drug use: Not Currently    Types: Marijuana    Comment: no longer smokes marijuana due to COPD   Sexual activity: Yes

## 2022-06-18 ENCOUNTER — Encounter: Payer: Self-pay | Admitting: Orthopaedic Surgery

## 2022-06-19 ENCOUNTER — Telehealth: Payer: Self-pay

## 2022-06-19 NOTE — Telephone Encounter (Signed)
Called patient to advise her the the MRI has been changed to with contrast. Therefore they will be calling from imaging to reschedule her MRI to next Monday or Tuesday.

## 2022-06-20 ENCOUNTER — Other Ambulatory Visit: Payer: Self-pay | Admitting: Physician Assistant

## 2022-06-21 ENCOUNTER — Other Ambulatory Visit: Payer: 59

## 2022-06-23 ENCOUNTER — Ambulatory Visit (INDEPENDENT_AMBULATORY_CARE_PROVIDER_SITE_OTHER): Payer: 59

## 2022-06-23 DIAGNOSIS — S51002A Unspecified open wound of left elbow, initial encounter: Secondary | ICD-10-CM | POA: Diagnosis not present

## 2022-06-23 DIAGNOSIS — M25522 Pain in left elbow: Secondary | ICD-10-CM | POA: Diagnosis not present

## 2022-06-23 DIAGNOSIS — S56512A Strain of other extensor muscle, fascia and tendon at forearm level, left arm, initial encounter: Secondary | ICD-10-CM | POA: Diagnosis not present

## 2022-06-23 DIAGNOSIS — M25422 Effusion, left elbow: Secondary | ICD-10-CM | POA: Diagnosis not present

## 2022-06-23 MED ORDER — GADOBUTROL 1 MMOL/ML IV SOLN
10.0000 mL | Freq: Once | INTRAVENOUS | Status: AC | PRN
  Administered 2022-06-23: 10 mL via INTRAVENOUS

## 2022-06-24 ENCOUNTER — Telehealth: Payer: Self-pay | Admitting: Orthopaedic Surgery

## 2022-06-24 ENCOUNTER — Telehealth: Payer: Self-pay

## 2022-06-24 ENCOUNTER — Encounter: Payer: Self-pay | Admitting: Orthopaedic Surgery

## 2022-06-24 MED ORDER — CIPROFLOXACIN HCL 500 MG PO TABS: 500.0000 mg | ORAL_TABLET | Freq: Two times a day (BID) | ORAL | 0 refills | Status: DC

## 2022-06-24 NOTE — Progress Notes (Signed)
Tried to call. No answer. LMOM for patient to call and scheduled follow up to discuss MRI results.

## 2022-06-25 ENCOUNTER — Encounter: Payer: Self-pay | Admitting: Orthopaedic Surgery

## 2022-06-25 ENCOUNTER — Encounter: Payer: Self-pay | Admitting: Physician Assistant

## 2022-06-25 DIAGNOSIS — S51002S Unspecified open wound of left elbow, sequela: Secondary | ICD-10-CM

## 2022-06-25 DIAGNOSIS — M7022 Olecranon bursitis, left elbow: Secondary | ICD-10-CM

## 2022-06-26 ENCOUNTER — Encounter: Payer: Self-pay | Admitting: Physician Assistant

## 2022-06-27 ENCOUNTER — Encounter: Payer: Self-pay | Admitting: Physician Assistant

## 2022-07-02 ENCOUNTER — Other Ambulatory Visit: Payer: Self-pay | Admitting: Physician Assistant

## 2022-07-05 ENCOUNTER — Other Ambulatory Visit: Payer: Self-pay | Admitting: Physician Assistant

## 2022-07-07 ENCOUNTER — Other Ambulatory Visit: Payer: Self-pay | Admitting: Physician Assistant

## 2022-07-08 ENCOUNTER — Telehealth: Payer: Self-pay | Admitting: Orthopaedic Surgery

## 2022-07-08 ENCOUNTER — Other Ambulatory Visit: Payer: Self-pay | Admitting: Neurology

## 2022-07-08 DIAGNOSIS — S51002A Unspecified open wound of left elbow, initial encounter: Secondary | ICD-10-CM | POA: Diagnosis not present

## 2022-07-08 MED ORDER — ACCU-CHEK GUIDE VI STRP: ORAL_STRIP | 12 refills | Status: DC

## 2022-07-08 NOTE — Telephone Encounter (Signed)
Received vm fom pt. IC. She needs to get copy of records and MRI for 2nd OP. I advised copy of records are ready and she will need to get MRI from [facility]. She will sign auth when she comes in to pickup.

## 2022-07-09 ENCOUNTER — Encounter: Payer: Self-pay | Admitting: Orthopaedic Surgery

## 2022-07-09 NOTE — Telephone Encounter (Signed)
Please see message about surgery per her request.  Thanks.

## 2022-07-11 ENCOUNTER — Encounter: Payer: Self-pay | Admitting: Orthopaedic Surgery

## 2022-07-11 ENCOUNTER — Other Ambulatory Visit: Payer: Self-pay

## 2022-07-11 ENCOUNTER — Encounter (HOSPITAL_BASED_OUTPATIENT_CLINIC_OR_DEPARTMENT_OTHER): Payer: Self-pay | Admitting: Orthopaedic Surgery

## 2022-07-15 ENCOUNTER — Other Ambulatory Visit: Payer: Self-pay | Admitting: Physician Assistant

## 2022-07-15 ENCOUNTER — Encounter: Payer: Self-pay | Admitting: Physician Assistant

## 2022-07-16 ENCOUNTER — Encounter (HOSPITAL_BASED_OUTPATIENT_CLINIC_OR_DEPARTMENT_OTHER)
Admission: RE | Admit: 2022-07-16 | Discharge: 2022-07-16 | Disposition: A | Discharge status: Home / Self Care | Payer: 59 | Source: Ambulatory Visit | Attending: Orthopaedic Surgery | Admitting: Orthopaedic Surgery

## 2022-07-16 DIAGNOSIS — K219 Gastro-esophageal reflux disease without esophagitis: Secondary | ICD-10-CM | POA: Diagnosis not present

## 2022-07-16 DIAGNOSIS — Z7984 Long term (current) use of oral hypoglycemic drugs: Secondary | ICD-10-CM | POA: Diagnosis not present

## 2022-07-16 DIAGNOSIS — F419 Anxiety disorder, unspecified: Secondary | ICD-10-CM | POA: Diagnosis not present

## 2022-07-16 DIAGNOSIS — E119 Type 2 diabetes mellitus without complications: Secondary | ICD-10-CM | POA: Diagnosis not present

## 2022-07-16 DIAGNOSIS — Z01818 Encounter for other preprocedural examination: Secondary | ICD-10-CM | POA: Insufficient documentation

## 2022-07-16 DIAGNOSIS — G709 Myoneural disorder, unspecified: Secondary | ICD-10-CM | POA: Diagnosis not present

## 2022-07-16 DIAGNOSIS — J449 Chronic obstructive pulmonary disease, unspecified: Secondary | ICD-10-CM | POA: Diagnosis not present

## 2022-07-16 DIAGNOSIS — X58XXXA Exposure to other specified factors, initial encounter: Secondary | ICD-10-CM | POA: Diagnosis not present

## 2022-07-16 DIAGNOSIS — Z794 Long term (current) use of insulin: Secondary | ICD-10-CM | POA: Insufficient documentation

## 2022-07-16 DIAGNOSIS — F32A Depression, unspecified: Secondary | ICD-10-CM | POA: Diagnosis not present

## 2022-07-16 DIAGNOSIS — S51002A Unspecified open wound of left elbow, initial encounter: Secondary | ICD-10-CM | POA: Diagnosis present

## 2022-07-16 DIAGNOSIS — I10 Essential (primary) hypertension: Secondary | ICD-10-CM | POA: Diagnosis not present

## 2022-07-16 DIAGNOSIS — F17210 Nicotine dependence, cigarettes, uncomplicated: Secondary | ICD-10-CM | POA: Diagnosis not present

## 2022-07-16 LAB — BASIC METABOLIC PANEL
Anion gap: 8 (ref 5–15)
BUN: 23 mg/dL — ABNORMAL HIGH (ref 6–20)
CO2: 27 mmol/L (ref 22–32)
Calcium: 9.1 mg/dL (ref 8.9–10.3)
Chloride: 102 mmol/L (ref 98–111)
Creatinine, Ser: 0.97 mg/dL (ref 0.44–1.00)
GFR, Estimated: >60 mL/min (ref >60)
Glucose, Bld: 178 mg/dL — ABNORMAL HIGH (ref 70–99)
Potassium: 4.3 mmol/L (ref 3.5–5.1)
Sodium: 137 mmol/L (ref 135–145)

## 2022-07-16 MED ORDER — NYSTATIN 100000 UNIT/ML MT SUSP: 5.0000 mL | Freq: Four times a day (QID) | OROMUCOSAL | 0 refills | Status: DC

## 2022-07-17 ENCOUNTER — Encounter: Payer: Self-pay | Admitting: Orthopaedic Surgery

## 2022-07-18 ENCOUNTER — Ambulatory Visit (HOSPITAL_BASED_OUTPATIENT_CLINIC_OR_DEPARTMENT_OTHER)
Admission: RE | Admit: 2022-07-18 | Discharge: 2022-07-18 | Disposition: A | Discharge status: Home / Self Care | Payer: 59 | Source: Ambulatory Visit | Attending: Orthopaedic Surgery | Admitting: Orthopaedic Surgery

## 2022-07-18 ENCOUNTER — Ambulatory Visit (HOSPITAL_BASED_OUTPATIENT_CLINIC_OR_DEPARTMENT_OTHER): Payer: 59 | Admitting: Certified Registered"

## 2022-07-18 ENCOUNTER — Other Ambulatory Visit: Payer: Self-pay

## 2022-07-18 ENCOUNTER — Encounter (HOSPITAL_BASED_OUTPATIENT_CLINIC_OR_DEPARTMENT_OTHER): Payer: Self-pay | Admitting: Orthopaedic Surgery

## 2022-07-18 ENCOUNTER — Other Ambulatory Visit: Payer: Self-pay | Admitting: Physician Assistant

## 2022-07-18 ENCOUNTER — Encounter (HOSPITAL_BASED_OUTPATIENT_CLINIC_OR_DEPARTMENT_OTHER)
Admission: RE | Disposition: A | Discharge status: Home / Self Care | Payer: Self-pay | Source: Ambulatory Visit | Attending: Orthopaedic Surgery

## 2022-07-18 DIAGNOSIS — J449 Chronic obstructive pulmonary disease, unspecified: Secondary | ICD-10-CM

## 2022-07-18 DIAGNOSIS — E119 Type 2 diabetes mellitus without complications: Secondary | ICD-10-CM | POA: Insufficient documentation

## 2022-07-18 DIAGNOSIS — Z794 Long term (current) use of insulin: Secondary | ICD-10-CM | POA: Insufficient documentation

## 2022-07-18 DIAGNOSIS — X58XXXA Exposure to other specified factors, initial encounter: Secondary | ICD-10-CM | POA: Insufficient documentation

## 2022-07-18 DIAGNOSIS — I10 Essential (primary) hypertension: Secondary | ICD-10-CM | POA: Insufficient documentation

## 2022-07-18 DIAGNOSIS — F419 Anxiety disorder, unspecified: Secondary | ICD-10-CM | POA: Insufficient documentation

## 2022-07-18 DIAGNOSIS — S51002A Unspecified open wound of left elbow, initial encounter: Secondary | ICD-10-CM | POA: Insufficient documentation

## 2022-07-18 DIAGNOSIS — G709 Myoneural disorder, unspecified: Secondary | ICD-10-CM | POA: Insufficient documentation

## 2022-07-18 DIAGNOSIS — Z7984 Long term (current) use of oral hypoglycemic drugs: Secondary | ICD-10-CM | POA: Insufficient documentation

## 2022-07-18 DIAGNOSIS — F17210 Nicotine dependence, cigarettes, uncomplicated: Secondary | ICD-10-CM | POA: Insufficient documentation

## 2022-07-18 DIAGNOSIS — G629 Polyneuropathy, unspecified: Secondary | ICD-10-CM

## 2022-07-18 DIAGNOSIS — M5136 Other intervertebral disc degeneration, lumbar region: Secondary | ICD-10-CM

## 2022-07-18 DIAGNOSIS — G8929 Other chronic pain: Secondary | ICD-10-CM

## 2022-07-18 DIAGNOSIS — T8189XA Other complications of procedures, not elsewhere classified, initial encounter: Secondary | ICD-10-CM | POA: Diagnosis not present

## 2022-07-18 DIAGNOSIS — K219 Gastro-esophageal reflux disease without esophagitis: Secondary | ICD-10-CM | POA: Insufficient documentation

## 2022-07-18 DIAGNOSIS — F32A Depression, unspecified: Secondary | ICD-10-CM | POA: Insufficient documentation

## 2022-07-18 HISTORY — PX: I & D EXTREMITY: SHX5045

## 2022-07-18 LAB — GLUCOSE, CAPILLARY
Glucose-Capillary: 154 mg/dL — ABNORMAL HIGH (ref 70–99)
Glucose-Capillary: 136 mg/dL — ABNORMAL HIGH (ref 70–99)

## 2022-07-18 SURGERY — IRRIGATION AND DEBRIDEMENT EXTREMITY
Anesthesia: General | Site: Elbow | Laterality: Left

## 2022-07-18 MED ORDER — AMISULPRIDE (ANTIEMETIC) 5 MG/2ML IV SOLN: 10.0000 mg | Freq: Once | INTRAVENOUS | Status: DC | PRN

## 2022-07-18 MED ORDER — LACTATED RINGERS IV SOLN: INTRAVENOUS | Status: DC | PRN

## 2022-07-18 MED ORDER — ONDANSETRON HCL 4 MG/2ML IJ SOLN
INTRAMUSCULAR | Status: DC | PRN
  Administered 2022-07-18: 4 mg via INTRAVENOUS

## 2022-07-18 MED ORDER — FENTANYL CITRATE (PF) 100 MCG/2ML IJ SOLN
INTRAMUSCULAR | Status: AC
  Filled 2022-07-18: qty 2

## 2022-07-18 MED ORDER — FENTANYL CITRATE (PF) 100 MCG/2ML IJ SOLN
INTRAMUSCULAR | Status: DC | PRN
  Administered 2022-07-18 (×2): 25 ug via INTRAVENOUS
  Administered 2022-07-18: 50 ug via INTRAVENOUS

## 2022-07-18 MED ORDER — FENTANYL CITRATE (PF) 100 MCG/2ML IJ SOLN
25.0000 ug | INTRAMUSCULAR | Status: DC | PRN
  Administered 2022-07-18 (×2): 50 ug via INTRAVENOUS

## 2022-07-18 MED ORDER — OXYCODONE HCL 5 MG PO TABS
ORAL_TABLET | ORAL | Status: AC
  Filled 2022-07-18: qty 1

## 2022-07-18 MED ORDER — EPHEDRINE SULFATE (PRESSORS) 50 MG/ML IJ SOLN
INTRAMUSCULAR | Status: DC | PRN
  Administered 2022-07-18: 15 mg via INTRAVENOUS
  Administered 2022-07-18: 10 mg via INTRAVENOUS

## 2022-07-18 MED ORDER — DEXAMETHASONE SODIUM PHOSPHATE 10 MG/ML IJ SOLN
INTRAMUSCULAR | Status: AC
  Filled 2022-07-18: qty 1

## 2022-07-18 MED ORDER — PHENYLEPHRINE HCL (PRESSORS) 10 MG/ML IV SOLN
INTRAVENOUS | Status: DC | PRN
  Administered 2022-07-18: 80 ug via INTRAVENOUS

## 2022-07-18 MED ORDER — ONDANSETRON HCL 4 MG PO TABS: 4.0000 mg | ORAL_TABLET | Freq: Three times a day (TID) | ORAL | 0 refills | Status: DC | PRN

## 2022-07-18 MED ORDER — DEXAMETHASONE SODIUM PHOSPHATE 10 MG/ML IJ SOLN
INTRAMUSCULAR | Status: DC | PRN
  Administered 2022-07-18: 5 mg via INTRAVENOUS

## 2022-07-18 MED ORDER — ACETAMINOPHEN 10 MG/ML IV SOLN
1000.0000 mg | Freq: Once | INTRAVENOUS | Status: DC | PRN
  Administered 2022-07-18: 1000 mg via INTRAVENOUS

## 2022-07-18 MED ORDER — ONDANSETRON HCL 4 MG/2ML IJ SOLN
INTRAMUSCULAR | Status: AC
Start: 2022-07-18 — End: ?
  Filled 2022-07-18: qty 2

## 2022-07-18 MED ORDER — LACTATED RINGERS IV SOLN: INTRAVENOUS | Status: DC

## 2022-07-18 MED ORDER — PROMETHAZINE HCL 25 MG/ML IJ SOLN: 6.2500 mg | INTRAMUSCULAR | Status: DC | PRN

## 2022-07-18 MED ORDER — OXYCODONE HCL 5 MG/5ML PO SOLN: 5.0000 mg | Freq: Once | ORAL | Status: AC | PRN

## 2022-07-18 MED ORDER — ACETAMINOPHEN 325 MG PO TABS: 325.0000 mg | ORAL_TABLET | ORAL | Status: DC | PRN

## 2022-07-18 MED ORDER — ACETAMINOPHEN 10 MG/ML IV SOLN
INTRAVENOUS | Status: AC
  Filled 2022-07-18: qty 100

## 2022-07-18 MED ORDER — PROPOFOL 10 MG/ML IV BOLUS
INTRAVENOUS | Status: DC | PRN
  Administered 2022-07-18: 170 mg via INTRAVENOUS

## 2022-07-18 MED ORDER — OXYCODONE HCL 5 MG PO TABS
5.0000 mg | ORAL_TABLET | Freq: Once | ORAL | Status: AC | PRN
  Administered 2022-07-18: 5 mg via ORAL

## 2022-07-18 MED ORDER — ACETAMINOPHEN 160 MG/5ML PO SOLN: 325.0000 mg | ORAL | Status: DC | PRN

## 2022-07-18 MED ORDER — PROPOFOL 10 MG/ML IV BOLUS
INTRAVENOUS | Status: AC
  Filled 2022-07-18: qty 20

## 2022-07-18 MED ORDER — CEFAZOLIN SODIUM-DEXTROSE 2-3 GM-%(50ML) IV SOLR
INTRAVENOUS | Status: DC | PRN
  Administered 2022-07-18: 3 g via INTRAVENOUS

## 2022-07-18 MED ORDER — HYDROCODONE-ACETAMINOPHEN 7.5-325 MG PO TABS: 1.0000 | ORAL_TABLET | Freq: Every day | ORAL | 0 refills | Status: DC | PRN

## 2022-07-18 MED ORDER — 0.9 % SODIUM CHLORIDE (POUR BTL) OPTIME
TOPICAL | Status: DC | PRN
  Administered 2022-07-18: 3000 mL

## 2022-07-18 MED ORDER — LIDOCAINE 2% (20 MG/ML) 5 ML SYRINGE
INTRAMUSCULAR | Status: AC
  Filled 2022-07-18: qty 5

## 2022-07-18 MED ORDER — GLYCOPYRROLATE 0.2 MG/ML IJ SOLN
INTRAMUSCULAR | Status: DC | PRN
  Administered 2022-07-18: .2 mg via INTRAVENOUS

## 2022-07-18 MED ORDER — DOXYCYCLINE HYCLATE 100 MG PO TABS: 100.0000 mg | ORAL_TABLET | Freq: Two times a day (BID) | ORAL | 0 refills | Status: AC

## 2022-07-18 SURGICAL SUPPLY — 69 items
ADH SKN CLS APL DERMABOND .7 (GAUZE/BANDAGES/DRESSINGS)
BAND INSRT 18 STRL LF DISP RB (MISCELLANEOUS)
BAND RUBBER #18 3X1/16 STRL (MISCELLANEOUS) ×1 IMPLANT
BLADE SURG 15 STRL LF DISP TIS (BLADE) ×2 IMPLANT
BLADE SURG 15 STRL SS (BLADE) ×4
BNDG CMPR 75X21 PLY HI ABS (MISCELLANEOUS)
BNDG CMPR 9X4 STRL LF SNTH (GAUZE/BANDAGES/DRESSINGS) ×1
BNDG COHESIVE 1X5 TAN STRL LF (GAUZE/BANDAGES/DRESSINGS) IMPLANT
BNDG COHESIVE 4X5 TAN ST LF (GAUZE/BANDAGES/DRESSINGS) ×1 IMPLANT
BNDG ELASTIC 3X5.8 VLCR STR LF (GAUZE/BANDAGES/DRESSINGS) ×1 IMPLANT
BNDG ESMARK 4X9 LF (GAUZE/BANDAGES/DRESSINGS) ×2 IMPLANT
BRUSH SCRUB EZ PLAIN DRY (MISCELLANEOUS) ×2 IMPLANT
CORD BIPOLAR FORCEPS 12FT (ELECTRODE) ×2 IMPLANT
COVER BACK TABLE 60X90IN (DRAPES) ×2 IMPLANT
COVER MAYO STAND STRL (DRAPES) ×1 IMPLANT
CUFF TOURN SGL QUICK 18X4 (TOURNIQUET CUFF) IMPLANT
DERMABOND ADVANCED (GAUZE/BANDAGES/DRESSINGS)
DERMABOND ADVANCED .7 DNX12 (GAUZE/BANDAGES/DRESSINGS) IMPLANT
DRAPE EXTREMITY T 121X128X90 (DISPOSABLE) ×2 IMPLANT
DRAPE IMP U-DRAPE 54X76 (DRAPES) ×2 IMPLANT
DRAPE SURG 17X23 STRL (DRAPES) ×2 IMPLANT
ELECT REM PT RETURN 9FT ADLT (ELECTROSURGICAL) ×2
ELECTRODE REM PT RTRN 9FT ADLT (ELECTROSURGICAL) IMPLANT
GAUZE 4X4 16PLY ~~LOC~~+RFID DBL (SPONGE) IMPLANT
GAUZE SPONGE 4X4 12PLY STRL (GAUZE/BANDAGES/DRESSINGS) ×2 IMPLANT
GAUZE STRETCH 2X75IN STRL (MISCELLANEOUS) ×1 IMPLANT
GAUZE XEROFORM 1X8 LF (GAUZE/BANDAGES/DRESSINGS) ×2 IMPLANT
GLOVE ECLIPSE 7.0 STRL STRAW (GLOVE) ×2 IMPLANT
GLOVE INDICATOR 7.0 STRL GRN (GLOVE) ×2 IMPLANT
GLOVE INDICATOR 7.5 STRL GRN (GLOVE) ×2 IMPLANT
GLOVE SURG SYN 7.5  E (GLOVE) ×2
GLOVE SURG SYN 7.5 E (GLOVE) ×2 IMPLANT
GLOVE SURG SYN 7.5 PF PI (GLOVE) ×2 IMPLANT
GOWN STRL REIN XL XLG (GOWN DISPOSABLE) ×2 IMPLANT
GOWN STRL REUS W/ TWL LRG LVL3 (GOWN DISPOSABLE) ×1 IMPLANT
GOWN STRL REUS W/ TWL XL LVL3 (GOWN DISPOSABLE) ×2 IMPLANT
GOWN STRL REUS W/TWL LRG LVL3 (GOWN DISPOSABLE)
GOWN STRL REUS W/TWL XL LVL3 (GOWN DISPOSABLE) ×4
MANIFOLD NEPTUNE II (INSTRUMENTS) ×1 IMPLANT
NDL HYPO 25X1 1.5 SAFETY (NEEDLE) IMPLANT
NEEDLE HYPO 25X1 1.5 SAFETY (NEEDLE) IMPLANT
NS IRRIG 1000ML POUR BTL (IV SOLUTION) ×1 IMPLANT
PACK BASIN DAY SURGERY FS (CUSTOM PROCEDURE TRAY) ×2 IMPLANT
PADDING CAST SYNTHETIC 4 (CAST SUPPLIES) ×3
PADDING CAST SYNTHETIC 4X4 STR (CAST SUPPLIES) IMPLANT
PENCIL SMOKE EVAC W/HOLSTER (ELECTROSURGICAL) ×1 IMPLANT
SHEET MEDIUM DRAPE 40X70 STRL (DRAPES) ×2 IMPLANT
SLEEVE SCD COMPRESS KNEE MED (STOCKING) ×1 IMPLANT
SPIKE FLUID TRANSFER (MISCELLANEOUS) IMPLANT
SPLINT FIBERGLASS 3X35 (CAST SUPPLIES) ×1 IMPLANT
STOCKINETTE 4X48 STRL (DRAPES) ×1 IMPLANT
STOCKINETTE IMPERVIOUS LG (DRAPES) ×1 IMPLANT
SUT ETHILON 2 0 FS 18 (SUTURE) IMPLANT
SUT ETHILON 3 0 PS 1 (SUTURE) ×1 IMPLANT
SUT ETHILON 4 0 PS 2 18 (SUTURE) ×1 IMPLANT
SUT MON AB 2-0 CT1 36 (SUTURE) ×1 IMPLANT
SUT PDS AB 0 CT 36 (SUTURE) ×1 IMPLANT
SUT VIC AB 0 CT1 27 (SUTURE)
SUT VIC AB 0 CT1 27XBRD ANBCTR (SUTURE) IMPLANT
SUT VIC AB 2-0 CT1 27 (SUTURE)
SUT VIC AB 2-0 CT1 TAPERPNT 27 (SUTURE) IMPLANT
SWAB COLLECTION DEVICE MRSA (MISCELLANEOUS) ×1 IMPLANT
SWAB CULTURE ESWAB REG 1ML (MISCELLANEOUS) ×1 IMPLANT
SYR BULB EAR ULCER 3OZ GRN STR (SYRINGE) ×1 IMPLANT
SYR CONTROL 10ML LL (SYRINGE) IMPLANT
TOWEL GREEN STERILE FF (TOWEL DISPOSABLE) ×2 IMPLANT
TRAY DSU PREP LF (CUSTOM PROCEDURE TRAY) ×2 IMPLANT
TUBE CONNECTING 20X1/4 (TUBING) ×1 IMPLANT
YANKAUER SUCT BULB TIP NO VENT (SUCTIONS) ×1 IMPLANT

## 2022-07-18 NOTE — H&P (Signed)
PREOPERATIVE H&P  Chief Complaint: left elbow wound  HPI: Kathleen Le is a 61 y.o. female who presents for surgical treatment of left elbow wound.  She denies any changes in medical history.  Past Medical History:  Diagnosis Date   Anxiety    Arthritis    Asthma    Cancer ([place])    COPD (chronic obstructive pulmonary disease) ([facility])    Degenerative disc disease, lumbar    Diabetes mellitus without complication ([facility])    Dyspnea    with exertion due to COPD   GERD (gastroesophageal reflux disease)    History of alcohol abuse    20-30 years ago, patient was going through divorce but does not currently drink any alcohol   History of marijuana use    "its been a long time since I last smoked because of my COPD"   History of ovarian cancer 1984   Hypertension    Pneumonia    Substance abuse ([place])    patient denies, states she has only ever used marijuana   Past Surgical History:  Procedure Laterality Date   DILATION AND CURETTAGE OF UTERUS     ECTOPIC PREGNANCY SURGERY     patient stated she had to have tube removed   I & D EXTREMITY Left 05/12/2022   Procedure: IRRIGATION AND DEBRIDEMENT LEFT ELBOW;  Surgeon: Leandrew Koyanagi, MD;  Location: [facility];  Service: Orthopedics;  Laterality: Left;   TONSILLECTOMY     TRICEPS TENDON REPAIR Left 02/07/2022   Procedure: left triceps repair;  Surgeon: Leandrew Koyanagi, MD;  Location: [facility];  Service: Orthopedics;  Laterality: Left;   Social History   Socioeconomic History   Marital status: Married    Spouse name: Not on file   Number of children: Not on file   Years of education: Not on file   Highest education level: Not on file  Occupational History   Not on file  Tobacco Use   Smoking status: Every Day    Packs/day: 1.00    Types: Cigarettes   Smokeless tobacco: Never   Tobacco comments:    1 pack a day   Vaping Use   Vaping Use: Never used  Substance and Sexual Activity   Alcohol use: Not Currently   Drug use: Not  Currently    Types: Marijuana    Comment: no longer smokes marijuana due to COPD   Sexual activity: Yes    Birth control/protection: Post-menopausal  Other Topics Concern   Not on file  Social History Narrative   Not on file   Social Determinants of Health   Financial Resource Strain: Not on file  Food Insecurity: Not on file  Transportation Needs: Not on file  Physical Activity: Not on file  Stress: Not on file  Social Connections: Not on file   Family History  Problem Relation Age of Onset   Cancer Paternal Grandmother    Diabetes Paternal Grandmother    Diabetes Paternal Grandfather    Heart attack Maternal Aunt    Cancer Maternal Uncle    Cancer Maternal Grandmother    Heart attack Mother    Cancer Mother    Stroke Mother    Diabetes Father    Heart attack Father    Diabetes Paternal Uncle    Diabetes Paternal Uncle    Allergies  Allergen Reactions   Gabapentin Nausea And Vomiting   Invokana [Canagliflozin] Other (See Comments)    Vaginitis    Lyrica [Pregabalin] Other (  See Comments)    Pain in feet increased and spasm   Prior to Admission medications   Medication Sig Start Date End Date Taking? Authorizing Provider  albuterol (VENTOLIN HFA) 108 (90 Base) MCG/ACT inhaler INHALE 1 TO 2 PUFFS BY MOUTH EVERY 6 HOURS AS NEEDED FOR WHEEZING OR SHORTNESS OF BREATH 07/07/22  Yes Breeback, [name] L, PA-C  amLODipine (NORVASC) 5 MG tablet Take 1 tablet (5 mg total) by mouth daily. 05/06/22  Yes Breeback, [name] L, PA-C  aspirin 81 MG EC tablet Take 81 mg by mouth daily.   Yes [provider]  atenolol (TENORMIN) 50 MG tablet Take 1 tablet (50 mg total) by mouth daily. 05/06/22  Yes Breeback, [name] L, PA-C  Calcium Carbonate (CALCARB 600 PO) Take 600 mg by mouth daily.   Yes [provider]  ciprofloxacin (CIPRO) 500 MG tablet Take 1 tablet (500 mg total) by mouth 2 (two) times daily. 06/24/22  Yes Leandrew Koyanagi, MD  doxepin (SINEQUAN) 25 MG capsule Take 1-2  capsules (25-50 mg total) by mouth at bedtime as needed (sleep). 05/16/22  Yes Breeback, [name] L, PA-C  DULoxetine (CYMBALTA) 30 MG capsule Take 1 capsule by mouth twice daily 07/18/22  Yes Breeback, [name] L, PA-C  esomeprazole (NEXIUM) 20 MG capsule Take 20 mg by mouth daily.   Yes [provider]  Flaxseed, Linseed, (FLAX SEED OIL) 1000 MG CAPS Take 1,000 mg by mouth daily.   Yes [provider]  Fluticasone-Umeclidin-Vilant (TRELEGY ELLIPTA) 100-62.5-25 MCG/ACT AEPB Inhale 1 puff into the lungs daily. 03/04/22  Yes Breeback, [name] L, PA-C  glipiZIDE (GLUCOTROL) 10 MG tablet Take 1 tablet (10 mg total) by mouth 2 (two) times daily before a meal. 05/06/22  Yes Breeback, [name] L, PA-C  hydrochlorothiazide (HYDRODIURIL) 25 MG tablet Take 1 tablet by mouth once daily 06/13/22  Yes Breeback, [name] L, PA-C  HYDROcodone-acetaminophen (NORCO) 7.5-325 MG tablet Take 1-2 tablets by mouth daily as needed for moderate pain. 05/12/22  Yes Leandrew Koyanagi, MD  lisinopril (ZESTRIL) 5 MG tablet Take 1 tablet by mouth once daily 05/30/22  Yes Breeback, [name] L, PA-C  magnesium gluconate (MAGONATE) 500 MG tablet Take 500 mg by mouth daily.   Yes [provider]  meloxicam (MOBIC) 15 MG tablet Take 1 tablet by mouth once daily 05/30/22  Yes Breeback, [name] L, PA-C  metFORMIN (GLUCOPHAGE) 1000 MG tablet Take 1 tablet (1,000 mg total) by mouth 2 (two) times daily with a meal. 05/06/22  Yes Breeback, [name] L, PA-C  Multiple Vitamins-Minerals (MULTIVITAMIN WITH MINERALS) tablet Take 1 tablet by mouth daily.   Yes [provider]  NOVOLIN N RELION 100 UNIT/ML injection INJECT 40 UNITS SUBCUTANEOUSLY AT BEDTIME Patient taking differently: 25 Units at bedtime. 07/07/22  Yes Breeback, [name] L, PA-C  nystatin (MYCOSTATIN) 100000 UNIT/ML suspension Take 5 mLs (500,000 Units total) by mouth 4 (four) times daily. 07/16/22  Yes Breeback, [name] L, PA-C  nystatin (MYCOSTATIN/NYSTOP) powder Apply 1 application. topically 3  (three) times daily. Patient taking differently: Apply 1 application  topically 3 (three) times daily as needed (rash). 03/27/22  Yes Breeback, [name] L, PA-C  Omega-3 Fatty Acids (FISH OIL) 1000 MG CAPS Take 1,000 mg by mouth 2 (two) times daily.   Yes [provider]  OVER THE COUNTER MEDICATION Apply 1 application topically at bedtime. CBD Cream   Yes [provider]  pioglitazone (ACTOS) 45 MG tablet Take 1 tablet (45 mg total) by mouth daily. 05/06/22  Yes  Breeback, [name] L, PA-C  Probiotic Product (PROBIOTIC DAILY PO) Take 1 capsule by mouth daily.   Yes [provider]  rosuvastatin (CRESTOR) 20 MG tablet Take 1 tablet (20 mg total) by mouth daily. 05/06/22  Yes Breeback, [name] L, PA-C  sitaGLIPtin (JANUVIA) 100 MG tablet Take 1 tablet (100 mg total) by mouth daily. 05/06/22  Yes Breeback, [name] L, PA-C  Accu-Chek Softclix Lancets lancets USE 1  TO CHECK GLUCOSE UP TO 4 TIMES DAILY 07/02/22   Breeback, [name] L, PA-C  AMBULATORY NON FORMULARY MEDICATION Blood sugar testing strips and lancets for TrueTrack glucometer.  Use to check blood sugar up to two times a day.  Dx: Type 2 Diabetes 01/04/16   Donella Stade, PA-C  AMBULATORY NON FORMULARY MEDICATION Please provide needles for Tresiba pens 01/08/16   Breeback, [name] L, PA-C  blood glucose meter kit and supplies Dispense based on patient and insurance preference. Use up to four times daily as directed. (FOR ICD-10 E10.9, E11.9). 05/06/22   Breeback, [name] L, PA-C  glucose blood (ACCU-CHEK GUIDE) test strip Use 1 up to 4 times daily to check blood sugar 07/08/22   Breeback, [name] L, PA-C  Insulin Syringe-Needle U-100 (INSULIN SYRINGE .3CC/31GX5/16") 31G X 5/16" 0.3 ML MISC Dx DM E11.9. Inject insulin daily at bedtime. 06/30/21   Breeback, [name] L, PA-C  ipratropium-albuterol (DUONEB) 0.5-2.5 (3) MG/3ML SOLN Take 3 mLs by nebulization every 6 (six) hours as needed. 03/04/22   Breeback, [name] L, PA-C     Positive ROS: All other systems have  been reviewed and were otherwise negative with the exception of those mentioned in the HPI and as above.  Physical Exam: General: Alert, no acute distress Cardiovascular: No pedal edema Respiratory: No cyanosis, no use of accessory musculature GI: abdomen soft Skin: No lesions in the area of chief complaint Neurologic: Sensation intact distally Psychiatric: Patient is competent for consent with normal mood and affect Lymphatic: no lymphedema  MUSCULOSKELETAL: exam stable  Assessment: left elbow wound  Plan: Plan for Procedure(s): LEFT ELBOW IRRIGATION AND DEBRIDEMENT EXTREMITY  The risks benefits and alternatives were discussed with the patient including but not limited to the risks of nonoperative treatment, versus surgical intervention including infection, bleeding, nerve injury,  blood clots, cardiopulmonary complications, morbidity, mortality, among others, and they were willing to proceed.   Eduard Roux, MD 07/18/2022 11:48 AM

## 2022-07-18 NOTE — Discharge Instructions (Addendum)
Postoperative instructions:  Weightbearing instructions: weight bearing as tolerated  Dressing instructions: Keep your dressing and/or splint clean and dry at all times.  It will be removed at your first post-operative appointment.  Your stitches and/or staples will be removed at this visit.  Incision instructions:  Do not soak your incision for 3 weeks after surgery.  If the incision gets wet, pat dry and do not scrub the incision.  Pain control:  You have been given a prescription to be taken as directed for post-operative pain control.  In addition, elevate the operative extremity above the heart at all times to prevent swelling and throbbing pain.  Take over-the-counter Colace, '100mg'$  by mouth twice a day while taking narcotic pain medications to help prevent constipation.  Follow up appointments: 1) 7 days for suture removal and wound check. 2) Dr. Erlinda Hong as scheduled.   -------------------------------------------------------------------------------------------------------------  After Surgery Pain Control:  After your surgery, post-surgical discomfort or pain is likely. This discomfort can last several days to a few weeks. At certain times of the day your discomfort may be more intense.  Did you receive a nerve block?  A nerve block can provide pain relief for one hour to two days after your surgery. As long as the nerve block is working, you will experience little or no sensation in the area the surgeon operated on.  As the nerve block wears off, you will begin to experience pain or discomfort. It is very important that you begin taking your prescribed pain medication before the nerve block fully wears off. Treating your pain at the first sign of the block wearing off will ensure your pain is better controlled and more tolerable when full-sensation returns. Do not wait until the pain is intolerable, as the medicine will be less effective. It is better to treat pain in advance than to try  and catch up.  General Anesthesia:  If you did not receive a nerve block during your surgery, you will need to start taking your pain medication shortly after your surgery and should continue to do so as prescribed by your surgeon.  Pain Medication:  Most commonly we prescribe Vicodin and Percocet for post-operative pain. Both of these medications contain a combination of acetaminophen (Tylenol) and a narcotic to help control pain.   It takes between 30 and 45 minutes before pain medication starts to work. It is important to take your medication before your pain level gets too intense.   Nausea is a common side effect of many pain medications. You will want to eat something before taking your pain medicine to help prevent nausea.   If you are taking a prescription pain medication that contains acetaminophen, we recommend that you do not take additional over the counter acetaminophen (Tylenol).  Other pain relieving options:   Using a cold pack to ice the affected area a few times a day (15 to 20 minutes at a time) can help to relieve pain, reduce swelling and bruising.   Elevation of the affected area can also help to reduce pain and swelling.  Post Anesthesia Home Care Instructions  Activity: Get plenty of rest for the remainder of the day. A responsible individual must stay with you for 24 hours following the procedure.  For the next 24 hours, DO NOT: -Drive a car -Paediatric nurse -Drink alcoholic beverages -Take any medication unless instructed by your physician -Make any legal decisions or sign important papers.  Meals: Start with liquid foods such as gelatin or  soup. Progress to regular foods as tolerated. Avoid greasy, spicy, heavy foods. If nausea and/or vomiting occur, drink only clear liquids until the nausea and/or vomiting subsides. Call your physician if vomiting continues.  Special Instructions/Symptoms: Your throat may feel dry or sore from the anesthesia or the  breathing tube placed in your throat during surgery. If this causes discomfort, gargle with warm salt water. The discomfort should disappear within 24 hours.  If you had a scopolamine patch placed behind your ear for the management of post- operative nausea and/or vomiting:  1. The medication in the patch is effective for 72 hours, after which it should be removed.  Wrap patch in a tissue and discard in the trash. Wash hands thoroughly with soap and water. 2. You may remove the patch earlier than 72 hours if you experience unpleasant side effects which may include dry mouth, dizziness or visual disturbances. 3. Avoid touching the patch. Wash your hands with soap and water after contact with the patch.

## 2022-07-18 NOTE — Transfer of Care (Signed)
Immediate Anesthesia Transfer of Care Note  Patient: Kathleen Le  Procedure(s) Performed: LEFT ELBOW IRRIGATION AND DEBRIDEMENT EXTREMITY (Left: Elbow)  Patient Location: PACU  Anesthesia Type:General  Level of Consciousness: awake, alert  and oriented  Airway & Oxygen Therapy: Patient Spontanous Breathing and Patient connected to face mask oxygen  Post-op Assessment: Report given to RN and Post -op Vital signs reviewed and stable  Post vital signs: Reviewed and stable  Last Vitals:  Vitals Value Taken Time  BP 138/69 07/18/22 1257  Temp    Pulse 94 07/18/22 1259  Resp 17 07/18/22 1259  SpO2 93 % 07/18/22 1259  Vitals shown include unvalidated device data.  Last Pain:  Vitals:   07/18/22 1117  TempSrc: Oral  PainSc: 5       Patients Stated Pain Goal: 6 (53/79/43 2761)  Complications: No notable events documented.

## 2022-07-18 NOTE — Op Note (Signed)
   Date of Surgery: 07/18/2022  INDICATIONS: Ms. Lutterman is a 62 y.o.-year-old female with a left elbow draining wound.  The patient did consent to the procedure after discussion of the risks and benefits.  PREOPERATIVE DIAGNOSIS: Draining wound of left posterior elbow  POSTOPERATIVE DIAGNOSIS: Same.  PROCEDURE:  Irrigation and debridement of left elbow draining wound Adjacent tissue rearrangement left elbow 2 cm  Debridement type: Excisional Debridement  Side: left  Body Location: elbow   Tools used for debridement: scalpel and rongeur  SURGEON: N. Eduard Roux, M.D.  ASSIST: Ciro Backer [place], [place]; necessary for the timely completion of procedure and due to complexity of procedure.  ANESTHESIA:  general  IV FLUIDS AND URINE: See anesthesia.  ESTIMATED BLOOD LOSS: minimal mL.  IMPLANTS: None  DRAINS: none  COMPLICATIONS: see description of procedure.  DESCRIPTION OF PROCEDURE: The patient was brought to the operating room.  The patient had been signed prior to the procedure and this was documented. The patient had the anesthesia placed by the anesthesiologist.  A time-out was performed to confirm that this was the correct patient, site, side and location. The patient did receive antibiotics prior to the incision and was re-dosed during the procedure as needed at indicated intervals.  A tourniquet was placed.  The patient had the operative extremity prepped and draped in the standard surgical fashion.    A longitudinal incision was made on the posterior elbow.  The draining sinus was excised in a full-thickness manner.  Dissection was taken down to the subcutaneous level.  Olecranon bursectomy was performed.  There was no gross evidence of infection.  There was clear drainage consistent with synovial fluid from the back of the elbow joint.  The fluid was cultured.  Finger palpation was then performed and we found that there is a defect in the scar tissue from the previous  triceps repair.  The olecranon bursa was thickened.  This looked like and arthrocutaneous fistula.Hervey Ard excisional debridement was carried out with a rondure and scalpel of the bursa and surrounding tissue.  The tip of the olecranon was excoriated with a rondure to help promote healing.  We then thoroughly irrigated with 3 L normal saline.  I reinforced the posterior aspect of the elbow joint and the triceps tendon with surrounding soft tissue with 0 PDS.  Adjacent tissue rearrangement was performed.  Layered closure was performed with 2-0 Monocryl and 3-0 nylon.  Sterile dressings were applied.  The elbow was splinted in extension to help promote healing of the soft tissues.  [name] was necessary for opening, closing, retracting, limb positioning and overall facilitation and timely completion of the procedure.  POSTOPERATIVE PLAN: Patient will follow-up in the office in 2 weeks for suture removal.  We will follow-up on the cultures.  Azucena Cecil, MD 12:36 PM

## 2022-07-18 NOTE — Anesthesia Preprocedure Evaluation (Addendum)
Anesthesia Evaluation  Patient identified by MRN, date of birth, ID band Patient awake    Reviewed: Allergy & Precautions, NPO status , Patient's Chart, lab work & pertinent test results  Airway Mallampati: III  TM Distance: >3 FB Neck ROM: Full    Dental  (+) Teeth Intact, Dental Advisory Given   Pulmonary asthma , COPD, Current Smoker and Patient abstained from smoking.,    breath sounds clear to auscultation       Cardiovascular hypertension,  Rhythm:Regular Rate:Normal     Neuro/Psych PSYCHIATRIC DISORDERS Anxiety Depression  Neuromuscular disease    GI/Hepatic GERD  ,(+)     substance abuse  marijuana use,   Endo/Other  diabetes  Renal/GU      Musculoskeletal   Abdominal (+) + obese,   Peds  Hematology   Anesthesia Other Findings   Reproductive/Obstetrics                            Anesthesia Physical Anesthesia Plan  ASA: 3  Anesthesia Plan: General   Post-op Pain Management:    Induction: Intravenous  PONV Risk Score and Plan: 3 and Ondansetron, Dexamethasone and Midazolam  Airway Management Planned: LMA  Additional Equipment: None  Intra-op Plan:   Post-operative Plan: Extubation in OR  Informed Consent: I have reviewed the patients History and Physical, chart, labs and discussed the procedure including the risks, benefits and alternatives for the proposed anesthesia with the patient or authorized representative who has indicated his/her understanding and acceptance.     Dental advisory given  Plan Discussed with: CRNA  Anesthesia Plan Comments:        Anesthesia Quick Evaluation

## 2022-07-18 NOTE — H&P (Signed)
PREOPERATIVE H&P  Chief Complaint: left elbow wound  HPI: Kathleen Le is a 60 y.o. female who presents for surgical treatment of left elbow wound.  She denies any changes in medical history.  Past Medical History:  Diagnosis Date   Anxiety    Arthritis    Asthma    Cancer ([place])    COPD (chronic obstructive pulmonary disease) (HCC)    Degenerative disc disease, lumbar    Diabetes mellitus without complication (HCC)    Dyspnea    with exertion due to COPD   GERD (gastroesophageal reflux disease)    History of alcohol abuse    20-30 years ago, patient was going through divorce but does not currently drink any alcohol   History of marijuana use    "its been a long time since I last smoked because of my COPD"   History of ovarian cancer 1984   Hypertension    Pneumonia    Substance abuse ([place])    patient denies, states she has only ever used marijuana   Past Surgical History:  Procedure Laterality Date   DILATION AND CURETTAGE OF UTERUS     ECTOPIC PREGNANCY SURGERY     patient stated she had to have tube removed   I & D EXTREMITY Left 05/12/2022   Procedure: IRRIGATION AND DEBRIDEMENT LEFT ELBOW;  Surgeon: Leandrew Koyanagi, MD;  Location: [facility];  Service: Orthopedics;  Laterality: Left;   TONSILLECTOMY     TRICEPS TENDON REPAIR Left 02/07/2022   Procedure: left triceps repair;  Surgeon: Leandrew Koyanagi, MD;  Location: [facility];  Service: Orthopedics;  Laterality: Left;   Social History   Socioeconomic History   Marital status: Married    Spouse name: Not on file   Number of children: Not on file   Years of education: Not on file   Highest education level: Not on file  Occupational History   Not on file  Tobacco Use   Smoking status: Every Day    Packs/day: 1.00    Types: Cigarettes   Smokeless tobacco: Never   Tobacco comments:    1 pack a day   Vaping Use   Vaping Use: Never used  Substance and Sexual Activity   Alcohol use: Not Currently   Drug use: Not  Currently    Types: Marijuana    Comment: no longer smokes marijuana due to COPD   Sexual activity: Yes    Birth control/protection: Post-menopausal  Other Topics Concern   Not on file  Social History Narrative   Not on file   Social Determinants of Health   Financial Resource Strain: Not on file  Food Insecurity: Not on file  Transportation Needs: Not on file  Physical Activity: Not on file  Stress: Not on file  Social Connections: Not on file   Family History  Problem Relation Age of Onset   Cancer Paternal Grandmother    Diabetes Paternal Grandmother    Diabetes Paternal Grandfather    Heart attack Maternal Aunt    Cancer Maternal Uncle    Cancer Maternal Grandmother    Heart attack Mother    Cancer Mother    Stroke Mother    Diabetes Father    Heart attack Father    Diabetes Paternal Uncle    Diabetes Paternal Uncle    Allergies  Allergen Reactions   Gabapentin Nausea And Vomiting   Invokana [Canagliflozin] Other (See Comments)    Vaginitis    Lyrica [Pregabalin] Other (  See Comments)    Pain in feet increased and spasm   Prior to Admission medications   Medication Sig Start Date End Date Taking? Authorizing Provider  albuterol (VENTOLIN HFA) 108 (90 Base) MCG/ACT inhaler INHALE 1 TO 2 PUFFS BY MOUTH EVERY 6 HOURS AS NEEDED FOR WHEEZING OR SHORTNESS OF BREATH 07/07/22  Yes Breeback, [name] L, PA-C  amLODipine (NORVASC) 5 MG tablet Take 1 tablet (5 mg total) by mouth daily. 05/06/22  Yes Breeback, [name] L, PA-C  aspirin 81 MG EC tablet Take 81 mg by mouth daily.   Yes [provider]  atenolol (TENORMIN) 50 MG tablet Take 1 tablet (50 mg total) by mouth daily. 05/06/22  Yes Breeback, [name] L, PA-C  Calcium Carbonate (CALCARB 600 PO) Take 600 mg by mouth daily.   Yes [provider]  ciprofloxacin (CIPRO) 500 MG tablet Take 1 tablet (500 mg total) by mouth 2 (two) times daily. 06/24/22  Yes Leandrew Koyanagi, MD  doxepin (SINEQUAN) 25 MG capsule Take 1-2  capsules (25-50 mg total) by mouth at bedtime as needed (sleep). 05/16/22  Yes Breeback, [name] L, PA-C  esomeprazole (NEXIUM) 20 MG capsule Take 20 mg by mouth daily.   Yes [provider]  Flaxseed, Linseed, (FLAX SEED OIL) 1000 MG CAPS Take 1,000 mg by mouth daily.   Yes [provider]  Fluticasone-Umeclidin-Vilant (TRELEGY ELLIPTA) 100-62.5-25 MCG/ACT AEPB Inhale 1 puff into the lungs daily. 03/04/22  Yes Breeback, [name] L, PA-C  glipiZIDE (GLUCOTROL) 10 MG tablet Take 1 tablet (10 mg total) by mouth 2 (two) times daily before a meal. 05/06/22  Yes Breeback, [name] L, PA-C  hydrochlorothiazide (HYDRODIURIL) 25 MG tablet Take 1 tablet by mouth once daily 06/13/22  Yes Breeback, [name] L, PA-C  HYDROcodone-acetaminophen (NORCO) 7.5-325 MG tablet Take 1-2 tablets by mouth daily as needed for moderate pain. 05/12/22  Yes Leandrew Koyanagi, MD  lisinopril (ZESTRIL) 5 MG tablet Take 1 tablet by mouth once daily 05/30/22  Yes Breeback, [name] L, PA-C  magnesium gluconate (MAGONATE) 500 MG tablet Take 500 mg by mouth daily.   Yes [provider]  meloxicam (MOBIC) 15 MG tablet Take 1 tablet by mouth once daily 05/30/22  Yes Breeback, [name] L, PA-C  metFORMIN (GLUCOPHAGE) 1000 MG tablet Take 1 tablet (1,000 mg total) by mouth 2 (two) times daily with a meal. 05/06/22  Yes Breeback, [name] L, PA-C  Multiple Vitamins-Minerals (MULTIVITAMIN WITH MINERALS) tablet Take 1 tablet by mouth daily.   Yes [provider]  NOVOLIN N RELION 100 UNIT/ML injection INJECT 40 UNITS SUBCUTANEOUSLY AT BEDTIME Patient taking differently: 25 Units at bedtime. 07/07/22  Yes Breeback, [name] L, PA-C  nystatin (MYCOSTATIN/NYSTOP) powder Apply 1 application. topically 3 (three) times daily. Patient taking differently: Apply 1 application  topically 3 (three) times daily as needed (rash). 03/27/22  Yes Breeback, [name] L, PA-C  Omega-3 Fatty Acids (FISH OIL) 1000 MG CAPS Take 1,000 mg by mouth 2 (two) times daily.   Yes  [provider]  OVER THE COUNTER MEDICATION Apply 1 application topically at bedtime. CBD Cream   Yes [provider]  pioglitazone (ACTOS) 45 MG tablet Take 1 tablet (45 mg total) by mouth daily. 05/06/22  Yes Breeback, [name] L, PA-C  Probiotic Product (PROBIOTIC DAILY PO) Take 1 capsule by mouth daily.   Yes [provider]  rosuvastatin (CRESTOR) 20 MG tablet Take 1 tablet (20 mg total) by mouth daily. 05/06/22  Yes Breeback, Royetta Car, PA-C  sitaGLIPtin (JANUVIA) 100 MG tablet Take 1 tablet (100 mg total) by mouth daily. 05/06/22  Yes Breeback, [name] L, PA-C  Accu-Chek Softclix Lancets lancets USE 1  TO CHECK GLUCOSE UP TO 4 TIMES DAILY 07/02/22   Breeback, [name] L, PA-C  AMBULATORY NON FORMULARY MEDICATION Blood sugar testing strips and lancets for TrueTrack glucometer.  Use to check blood sugar up to two times a day.  Dx: Type 2 Diabetes 01/04/16   Donella Stade, PA-C  AMBULATORY NON FORMULARY MEDICATION Please provide needles for Tresiba pens 01/08/16   Breeback, [name] L, PA-C  blood glucose meter kit and supplies Dispense based on patient and insurance preference. Use up to four times daily as directed. (FOR ICD-10 E10.9, E11.9). 05/06/22   Donella Stade, PA-C  DULoxetine (CYMBALTA) 30 MG capsule Take 1 capsule by mouth twice daily 07/18/22   Breeback, [name] L, PA-C  glucose blood (ACCU-CHEK GUIDE) test strip Use 1 up to 4 times daily to check blood sugar 07/08/22   Breeback, [name] L, PA-C  Insulin Syringe-Needle U-100 (INSULIN SYRINGE .3CC/31GX5/16") 31G X 5/16" 0.3 ML MISC Dx DM E11.9. Inject insulin daily at bedtime. 06/30/21   Breeback, [name] L, PA-C  ipratropium-albuterol (DUONEB) 0.5-2.5 (3) MG/3ML SOLN Take 3 mLs by nebulization every 6 (six) hours as needed. 03/04/22   Breeback, [name] L, PA-C  nystatin (MYCOSTATIN) 100000 UNIT/ML suspension Take 5 mLs (500,000 Units total) by mouth 4 (four) times daily. 07/16/22   Breeback, [name] L, PA-C     Positive ROS: All other systems have  been reviewed and were otherwise negative with the exception of those mentioned in the HPI and as above.  Physical Exam: General: Alert, no acute distress Cardiovascular: No pedal edema Respiratory: No cyanosis, no use of accessory musculature GI: abdomen soft Skin: No lesions in the area of chief complaint Neurologic: Sensation intact distally Psychiatric: Patient is competent for consent with normal mood and affect Lymphatic: no lymphedema  MUSCULOSKELETAL: exam stable  Assessment: left elbow wound  Plan: Plan for Procedure(s): LEFT ELBOW IRRIGATION AND DEBRIDEMENT EXTREMITY  The risks benefits and alternatives were discussed with the patient including but not limited to the risks of nonoperative treatment, versus surgical intervention including infection, bleeding, nerve injury,  blood clots, cardiopulmonary complications, morbidity, mortality, among others, and they were willing to proceed.   Eduard Roux, MD 07/18/2022 11:11 AM

## 2022-07-18 NOTE — Anesthesia Postprocedure Evaluation (Signed)
Anesthesia Post Note  Patient: Kathleen Le  Procedure(s) Performed: LEFT ELBOW IRRIGATION AND DEBRIDEMENT EXTREMITY (Left: Elbow)     Patient location during evaluation: PACU Anesthesia Type: General Level of consciousness: awake and alert Pain management: pain level controlled Vital Signs Assessment: post-procedure vital signs reviewed and stable Respiratory status: spontaneous breathing, nonlabored ventilation, respiratory function stable and patient connected to nasal cannula oxygen Cardiovascular status: blood pressure returned to baseline and stable Postop Assessment: no apparent nausea or vomiting Anesthetic complications: no   No notable events documented.  Last Vitals:  Vitals:   07/18/22 1345 07/18/22 1404  BP: (!) 112/53 126/60  Pulse: 78 77  Resp: 18 19  Temp:  (!) 36.2 C  SpO2: 92% 92%    Last Pain:  Vitals:   07/18/22 1404  TempSrc:   PainSc: [name] Kathleen Le

## 2022-07-21 ENCOUNTER — Encounter (HOSPITAL_BASED_OUTPATIENT_CLINIC_OR_DEPARTMENT_OTHER): Payer: Self-pay | Admitting: Orthopaedic Surgery

## 2022-07-21 NOTE — Progress Notes (Signed)
Left message stating courtesy call and if any questions or concerns please call the doctors office.  

## 2022-07-23 LAB — AEROBIC/ANAEROBIC CULTURE W GRAM STAIN (SURGICAL/DEEP WOUND): Culture: NO GROWTH

## 2022-07-25 ENCOUNTER — Encounter: Payer: Self-pay | Admitting: Orthopaedic Surgery

## 2022-07-25 ENCOUNTER — Ambulatory Visit (INDEPENDENT_AMBULATORY_CARE_PROVIDER_SITE_OTHER): Payer: 59 | Admitting: Orthopaedic Surgery

## 2022-07-25 DIAGNOSIS — S51002A Unspecified open wound of left elbow, initial encounter: Secondary | ICD-10-CM

## 2022-07-25 NOTE — Progress Notes (Signed)
Post-Op Visit Note   Patient: Kathleen Le           Date of Birth: 1961-07-22           MRN: [id number] Visit Date: 07/25/2022 PCP: Donella Stade, PA-C   Assessment & Plan:  Chief Complaint:  Chief Complaint  Patient presents with   Left Elbow - Routine Post Op    Left elbow I&D 07/18/2022   Visit Diagnoses:  1. Elbow wound, left, initial encounter     Plan: [name] is 1 week status post left elbow I&D.  Pain is very mild.  Takes doxycycline.  Surgical incision is intact.  No drainage or dehiscence.  Splint was removed and dry dressing was recovered.  May begin gentle range of motion.  Plan to remove sutures in 2 weeks.  Cultures are no growth to date.  Follow-Up Instructions: Return in about 2 weeks (around 08/08/2022).   Orders:  No orders of the defined types were placed in this encounter.  No orders of the defined types were placed in this encounter.   Imaging: No results found.  PMFS History: Patient Active Problem List   Diagnosis Date Noted   Bursitis of elbow 05/28/2022   Elbow wound, left, initial encounter 05/11/2022   Centrilobular emphysema ([place]) 05/09/2022   Triceps tendon rupture, left, initial encounter 02/07/2022   Fall 12/31/2021   Contusion of left arm 12/31/2021   Acute left-sided low back pain without sciatica 12/31/2021   Neck pain 12/31/2021   Encounter for smoking cessation counseling 11/15/2021   Chronic obstructive pulmonary disease ([place]) 02/26/2021   Hyperlipidemia associated with type 2 diabetes mellitus ([place]) 11/05/2020   Non-restorative sleep 11/05/2020   Snoring 11/05/2020   Anterolisthesis 07/30/2020   Acute stress reaction 09/12/2018   Current smoker 06/08/2018   Diabetes mellitus, type II, insulin dependent ([place]) 06/08/2018   Claustrophobia 06/08/2018   Primary insomnia 06/08/2018   Hot flashes 06/08/2018   Abnormal weight gain 06/08/2018   Lower leg edema 11/10/2017   Insomnia due to other mental disorder 10/27/2017    History of substance abuse ([place]) 10/27/2017   GAD (generalized anxiety disorder) 10/27/2017   Class 3 severe obesity due to excess calories with serious comorbidity and body mass index (BMI) of 40.0 to 44.9 in adult ([place]) 08/31/2017   Cerumen impaction 01/24/2015   Hyperlipidemia 01/24/2015   Gastroesophageal reflux disease with esophagitis 01/10/2015   Foot callus 10/03/2014   PTSD (post-traumatic stress disorder) 09/26/2014   Depression 05/19/2014   Anxiety 05/19/2014   Chronic bilateral low back pain with right-sided sciatica 05/19/2014   DDD (degenerative disc disease), lumbar 05/19/2014   Neuropathy 02/05/2014   IT band syndrome 02/05/2014   Sinus tachycardia 02/05/2014   Memory changes 02/05/2014   Word finding difficulty 02/05/2014   Cervical cancer ([place]) 02/05/2014   Type II diabetes mellitus, uncontrolled 02/03/2014   Unspecified asthma(493.90) 02/03/2014   Essential hypertension, benign 02/03/2014   Pulmonary emphysema ([place]) 07/26/2012   Past Medical History:  Diagnosis Date   Anxiety    Arthritis    Asthma    Cancer ([place])    COPD (chronic obstructive pulmonary disease) ([place])    Degenerative disc disease, lumbar    Diabetes mellitus without complication ([place])    Dyspnea    with exertion due to COPD   GERD (gastroesophageal reflux disease)    History of alcohol abuse    20-30 years ago, patient was going through divorce but does not currently drink any alcohol  History of marijuana use    "its been a long time since I last smoked because of my COPD"   History of ovarian cancer 1984   Hypertension    Pneumonia    Substance abuse ([place])    patient denies, states she has only ever used marijuana    Family History  Problem Relation Age of Onset   Cancer Paternal Grandmother    Diabetes Paternal Grandmother    Diabetes Paternal Grandfather    Heart attack Maternal Aunt    Cancer Maternal Uncle    Cancer Maternal Grandmother    Heart attack Mother    Cancer  Mother    Stroke Mother    Diabetes Father    Heart attack Father    Diabetes Paternal Uncle    Diabetes Paternal Uncle     Past Surgical History:  Procedure Laterality Date   DILATION AND CURETTAGE OF UTERUS     ECTOPIC PREGNANCY SURGERY     patient stated she had to have tube removed   I & D EXTREMITY Left 05/12/2022   Procedure: IRRIGATION AND DEBRIDEMENT LEFT ELBOW;  Surgeon: Leandrew Koyanagi, MD;  Location: [facility];  Service: Orthopedics;  Laterality: Left;   I & D EXTREMITY Left 07/18/2022   Procedure: LEFT ELBOW IRRIGATION AND DEBRIDEMENT EXTREMITY;  Surgeon: Leandrew Koyanagi, MD;  Location: [facility];  Service: Orthopedics;  Laterality: Left;   TONSILLECTOMY     TRICEPS TENDON REPAIR Left 02/07/2022   Procedure: left triceps repair;  Surgeon: Leandrew Koyanagi, MD;  Location: [facility];  Service: Orthopedics;  Laterality: Left;   Social History   Occupational History   Not on file  Tobacco Use   Smoking status: Every Day    Packs/day: 1.00    Types: Cigarettes   Smokeless tobacco: Never   Tobacco comments:    1 pack a day   Vaping Use   Vaping Use: Never used  Substance and Sexual Activity   Alcohol use: Not Currently   Drug use: Not Currently    Types: Marijuana    Comment: no longer smokes marijuana due to COPD   Sexual activity: Yes    Birth control/protection: Post-menopausal

## 2022-07-30 ENCOUNTER — Other Ambulatory Visit: Payer: Self-pay | Admitting: Physician Assistant

## 2022-07-30 DIAGNOSIS — E1165 Type 2 diabetes mellitus with hyperglycemia: Secondary | ICD-10-CM

## 2022-08-05 ENCOUNTER — Ambulatory Visit (INDEPENDENT_AMBULATORY_CARE_PROVIDER_SITE_OTHER): Payer: 59 | Admitting: Physician Assistant

## 2022-08-05 ENCOUNTER — Other Ambulatory Visit: Payer: Self-pay | Admitting: Physician Assistant

## 2022-08-05 DIAGNOSIS — S46312A Strain of muscle, fascia and tendon of triceps, left arm, initial encounter: Secondary | ICD-10-CM

## 2022-08-05 NOTE — Progress Notes (Signed)
Post-Op Visit Note   Patient: Kathleen Le           Date of Birth: 1961-03-01           MRN: [id number] Visit Date: 08/05/2022 PCP: Donella Stade, PA-C   Assessment & Plan:  Chief Complaint:  Chief Complaint  Patient presents with   Left Elbow - Follow-up   Visit Diagnoses:  1. Triceps tendon rupture, left, initial encounter     Plan: Kathleen Le comes in for follow-up.  She is few days shy of 3 weeks status post left elbow I&D.  Cultures negative to date.  She is still on doxycycline and has about another week and a half left.  She denies any pain.  No fevers or chills.  She denies any drainage.  Examination of the left elbow reveals a fully healed surgical scar with nylon sutures in place.  No evidence of infection or cellulitis.  Today, sutures were removed and Steri-Strips applied.  She may continue with gentle range of motion as tolerated but she has been instructed to avoid any loadbearing to the left upper extremity.  She will finish her doxycycline.  She will follow-up with [place] in 3 weeks time for repeat evaluation.  Should anything change in the meantime she will let us know.  Call with concerns or questions.  Follow-Up Instructions: Return in about 3 weeks (around 08/26/2022).   Orders:  No orders of the defined types were placed in this encounter.  No orders of the defined types were placed in this encounter.   Imaging: No new imaging  PMFS History: Patient Active Problem List   Diagnosis Date Noted   Bursitis of elbow 05/28/2022   Elbow wound, left, initial encounter 05/11/2022   Centrilobular emphysema ([place]) 05/09/2022   Triceps tendon rupture, left, initial encounter 02/07/2022   Fall 12/31/2021   Contusion of left arm 12/31/2021   Acute left-sided low back pain without sciatica 12/31/2021   Neck pain 12/31/2021   Encounter for smoking cessation counseling 11/15/2021   Chronic obstructive pulmonary disease ([place]) 02/26/2021   Hyperlipidemia associated with  type 2 diabetes mellitus ([place]) 11/05/2020   Non-restorative sleep 11/05/2020   Snoring 11/05/2020   Anterolisthesis 07/30/2020   Acute stress reaction 09/12/2018   Current smoker 06/08/2018   Diabetes mellitus, type II, insulin dependent ([place]) 06/08/2018   Claustrophobia 06/08/2018   Primary insomnia 06/08/2018   Hot flashes 06/08/2018   Abnormal weight gain 06/08/2018   Lower leg edema 11/10/2017   Insomnia due to other mental disorder 10/27/2017   History of substance abuse ([place]) 10/27/2017   GAD (generalized anxiety disorder) 10/27/2017   Class 3 severe obesity due to excess calories with serious comorbidity and body mass index (BMI) of 40.0 to 44.9 in adult ([place]) 08/31/2017   Cerumen impaction 01/24/2015   Hyperlipidemia 01/24/2015   Gastroesophageal reflux disease with esophagitis 01/10/2015   Foot callus 10/03/2014   PTSD (post-traumatic stress disorder) 09/26/2014   Depression 05/19/2014   Anxiety 05/19/2014   Chronic bilateral low back pain with right-sided sciatica 05/19/2014   DDD (degenerative disc disease), lumbar 05/19/2014   Neuropathy 02/05/2014   IT band syndrome 02/05/2014   Sinus tachycardia 02/05/2014   Memory changes 02/05/2014   Word finding difficulty 02/05/2014   Cervical cancer ([place]) 02/05/2014   Type II diabetes mellitus, uncontrolled 02/03/2014   Unspecified asthma(493.90) 02/03/2014   Essential hypertension, benign 02/03/2014   Pulmonary emphysema ([place]) 07/26/2012   Past Medical History:  Diagnosis Date  Anxiety    Arthritis    Asthma    Cancer (H[facility])    COPD (chronic obstructive pulmonary disease) ([facility])    Degenerative disc disease, lumbar    Diabetes mellitus without complication ([facility])    Dyspnea    with exertion due to COPD   GERD (gastroesophageal reflux disease)    History of alcohol abuse    20-30 years ago, patient was going through divorce but does not currently drink any alcohol   History of marijuana use    "its been a long time  since I last smoked because of my COPD"   History of ovarian cancer 1984   Hypertension    Pneumonia    Substance abuse ([place])    patient denies, states she has only ever used marijuana    Family History  Problem Relation Age of Onset   Cancer Paternal Grandmother    Diabetes Paternal Grandmother    Diabetes Paternal Grandfather    Heart attack Maternal Aunt    Cancer Maternal Uncle    Cancer Maternal Grandmother    Heart attack Mother    Cancer Mother    Stroke Mother    Diabetes Father    Heart attack Father    Diabetes Paternal Uncle    Diabetes Paternal Uncle     Past Surgical History:  Procedure Laterality Date   DILATION AND CURETTAGE OF UTERUS     ECTOPIC PREGNANCY SURGERY     patient stated she had to have tube removed   I & D EXTREMITY Left 05/12/2022   Procedure: IRRIGATION AND DEBRIDEMENT LEFT ELBOW;  Surgeon: Leandrew Koyanagi, MD;  Location: [facility];  Service: Orthopedics;  Laterality: Left;   I & D EXTREMITY Left 07/18/2022   Procedure: LEFT ELBOW IRRIGATION AND DEBRIDEMENT EXTREMITY;  Surgeon: Leandrew Koyanagi, MD;  Location: [facility];  Service: Orthopedics;  Laterality: Left;   TONSILLECTOMY     TRICEPS TENDON REPAIR Left 02/07/2022   Procedure: left triceps repair;  Surgeon: Leandrew Koyanagi, MD;  Location: [facility];  Service: Orthopedics;  Laterality: Left;   Social History   Occupational History   Not on file  Tobacco Use   Smoking status: Every Day    Packs/day: 1.00    Types: Cigarettes   Smokeless tobacco: Never   Tobacco comments:    1 pack a day   Vaping Use   Vaping Use: Never used  Substance and Sexual Activity   Alcohol use: Not Currently   Drug use: Not Currently    Types: Marijuana    Comment: no longer smokes marijuana due to COPD   Sexual activity: Yes    Birth control/protection: Post-menopausal

## 2022-08-05 NOTE — Progress Notes (Deleted)
Post-Op Visit Note   Patient: Kathleen Le           Date of Birth: April 17, 1961           MRN: [id number] Visit Date: 08/05/2022 PCP: Donella Stade, PA-C   Assessment & Plan:  Chief Complaint: No chief complaint on file.  Visit Diagnoses: No diagnosis found.  Plan: Kathleen Le comes in for follow-up.  She is few days shy of 3 weeks status post left elbow I&D.  Cultures negative to date.  She is still on doxycycline and has about another week and a half left.  She denies any pain.  No fevers or chills.  She denies any drainage.  Examination of the left elbow reveals a fully healed surgical scar with nylon sutures in place.  No evidence of infection or cellulitis.  Today, sutures were removed and Steri-Strips applied.  She may continue with gentle range of motion as tolerated but she has been instructed to avoid any loadbearing to the left upper extremity.  She will finish her doxycycline.  She will follow-up with Kathleen Le in 3 weeks time for repeat evaluation.  Should anything change in the meantime she will let Kathleen Le know.  Call with concerns or questions.  Follow-Up Instructions: No follow-ups on file.   Orders:  No orders of the defined types were placed in this encounter.  No orders of the defined types were placed in this encounter.   Imaging: No results found.  PMFS History: Patient Active Problem List   Diagnosis Date Noted   Bursitis of elbow 05/28/2022   Elbow wound, left, initial encounter 05/11/2022   Centrilobular emphysema ([place]) 05/09/2022   Triceps tendon rupture, left, initial encounter 02/07/2022   Fall 12/31/2021   Contusion of left arm 12/31/2021   Acute left-sided low back pain without sciatica 12/31/2021   Neck pain 12/31/2021   Encounter for smoking cessation counseling 11/15/2021   Chronic obstructive pulmonary disease ([place]) 02/26/2021   Hyperlipidemia associated with type 2 diabetes mellitus ([place]) 11/05/2020   Non-restorative sleep 11/05/2020   Snoring 11/05/2020    Anterolisthesis 07/30/2020   Acute stress reaction 09/12/2018   Current smoker 06/08/2018   Diabetes mellitus, type II, insulin dependent ([place]) 06/08/2018   Claustrophobia 06/08/2018   Primary insomnia 06/08/2018   Hot flashes 06/08/2018   Abnormal weight gain 06/08/2018   Lower leg edema 11/10/2017   Insomnia due to other mental disorder 10/27/2017   History of substance abuse ([place]) 10/27/2017   GAD (generalized anxiety disorder) 10/27/2017   Class 3 severe obesity due to excess calories with serious comorbidity and body mass index (BMI) of 40.0 to 44.9 in adult ([place]) 08/31/2017   Cerumen impaction 01/24/2015   Hyperlipidemia 01/24/2015   Gastroesophageal reflux disease with esophagitis 01/10/2015   Foot callus 10/03/2014   PTSD (post-traumatic stress disorder) 09/26/2014   Depression 05/19/2014   Anxiety 05/19/2014   Chronic bilateral low back pain with right-sided sciatica 05/19/2014   DDD (degenerative disc disease), lumbar 05/19/2014   Neuropathy 02/05/2014   IT band syndrome 02/05/2014   Sinus tachycardia 02/05/2014   Memory changes 02/05/2014   Word finding difficulty 02/05/2014   Cervical cancer ([place]) 02/05/2014   Type II diabetes mellitus, uncontrolled 02/03/2014   Unspecified asthma(493.90) 02/03/2014   Essential hypertension, benign 02/03/2014   Pulmonary emphysema ([place]) 07/26/2012   Past Medical History:  Diagnosis Date   Anxiety    Arthritis    Asthma    Cancer ([place])    COPD (chronic obstructive  pulmonary disease) ([place])    Degenerative disc disease, lumbar    Diabetes mellitus without complication (HCC)    Dyspnea    with exertion due to COPD   GERD (gastroesophageal reflux disease)    History of alcohol abuse    20-30 years ago, patient was going through divorce but does not currently drink any alcohol   History of marijuana use    "its been a long time since I last smoked because of my COPD"   History of ovarian cancer 1984   Hypertension     Pneumonia    Substance abuse ([place])    patient denies, states she has only ever used marijuana    Family History  Problem Relation Age of Onset   Cancer Paternal Grandmother    Diabetes Paternal Grandmother    Diabetes Paternal Grandfather    Heart attack Maternal Aunt    Cancer Maternal Uncle    Cancer Maternal Grandmother    Heart attack Mother    Cancer Mother    Stroke Mother    Diabetes Father    Heart attack Father    Diabetes Paternal Uncle    Diabetes Paternal Uncle     Past Surgical History:  Procedure Laterality Date   DILATION AND CURETTAGE OF UTERUS     ECTOPIC PREGNANCY SURGERY     patient stated she had to have tube removed   I & D EXTREMITY Left 05/12/2022   Procedure: IRRIGATION AND DEBRIDEMENT LEFT ELBOW;  Surgeon: Kathleen Koyanagi, MD;  Location: [facility];  Service: Orthopedics;  Laterality: Left;   I & D EXTREMITY Left 07/18/2022   Procedure: LEFT ELBOW IRRIGATION AND DEBRIDEMENT EXTREMITY;  Surgeon: Kathleen Koyanagi, MD;  Location: [facility];  Service: Orthopedics;  Laterality: Left;   TONSILLECTOMY     TRICEPS TENDON REPAIR Left 02/07/2022   Procedure: left triceps repair;  Surgeon: Kathleen Koyanagi, MD;  Location: [facility];  Service: Orthopedics;  Laterality: Left;   Social History   Occupational History   Not on file  Tobacco Use   Smoking status: Every Day    Packs/day: 1.00    Types: Cigarettes   Smokeless tobacco: Never   Tobacco comments:    1 pack a day   Vaping Use   Vaping Use: Never used  Substance and Sexual Activity   Alcohol use: Not Currently   Drug use: Not Currently    Types: Marijuana    Comment: no longer smokes marijuana due to COPD   Sexual activity: Yes    Birth control/protection: Post-menopausal

## 2022-08-08 ENCOUNTER — Other Ambulatory Visit: Payer: Self-pay | Admitting: Physician Assistant

## 2022-08-13 ENCOUNTER — Encounter: Payer: Self-pay | Admitting: Physician Assistant

## 2022-08-13 MED ORDER — FLUCONAZOLE 150 MG PO TABS: ORAL_TABLET | ORAL | 0 refills | Status: DC

## 2022-08-21 ENCOUNTER — Ambulatory Visit (INDEPENDENT_AMBULATORY_CARE_PROVIDER_SITE_OTHER): Payer: 59 | Admitting: Family Medicine

## 2022-08-21 ENCOUNTER — Encounter: Payer: Self-pay | Admitting: Family Medicine

## 2022-08-21 ENCOUNTER — Other Ambulatory Visit: Payer: Self-pay | Admitting: Physician Assistant

## 2022-08-21 ENCOUNTER — Ambulatory Visit (INDEPENDENT_AMBULATORY_CARE_PROVIDER_SITE_OTHER): Payer: 59

## 2022-08-21 VITALS — BP 161/73 | HR 99 | Wt 288.0 lb

## 2022-08-21 DIAGNOSIS — H6123 Impacted cerumen, bilateral: Secondary | ICD-10-CM | POA: Diagnosis not present

## 2022-08-21 DIAGNOSIS — E1165 Type 2 diabetes mellitus with hyperglycemia: Secondary | ICD-10-CM

## 2022-08-21 DIAGNOSIS — R0602 Shortness of breath: Secondary | ICD-10-CM

## 2022-08-21 DIAGNOSIS — R0902 Hypoxemia: Secondary | ICD-10-CM

## 2022-08-21 DIAGNOSIS — J432 Centrilobular emphysema: Secondary | ICD-10-CM

## 2022-08-21 DIAGNOSIS — R059 Cough, unspecified: Secondary | ICD-10-CM | POA: Diagnosis not present

## 2022-08-21 LAB — COMPLETE METABOLIC PANEL WITH GFR
AG Ratio: 1.6 (calc) (ref 1.0–2.5)
ALT: 16 U/L (ref 6–29)
AST: 16 U/L (ref 10–35)
Albumin: 4.1 g/dL (ref 3.6–5.1)
Alkaline phosphatase (APISO): 74 U/L (ref 37–153)
BUN/Creatinine Ratio: 25 (calc) — ABNORMAL HIGH (ref 6–22)
BUN: 26 mg/dL — ABNORMAL HIGH (ref 7–25)
CO2: 31 mmol/L (ref 20–32)
Calcium: 9.5 mg/dL (ref 8.6–10.4)
Chloride: 101 mmol/L (ref 98–110)
Creat: 1.02 mg/dL (ref 0.50–1.03)
Globulin: 2.5 g/dL (calc) (ref 1.9–3.7)
Glucose, Bld: 181 mg/dL — ABNORMAL HIGH (ref 65–139)
Potassium: 4.6 mmol/L (ref 3.5–5.3)
Sodium: 140 mmol/L (ref 135–146)
Total Bilirubin: 0.4 mg/dL (ref 0.2–1.2)
Total Protein: 6.6 g/dL (ref 6.1–8.1)
eGFR: 63 mL/min/{1.73_m2} (ref >=60)

## 2022-08-21 LAB — CBC WITH DIFFERENTIAL/PLATELET
Absolute Monocytes: 586 cells/uL (ref 200–950)
Basophils Absolute: 32 cells/uL (ref 0–200)
Basophils Relative: 0.5 %
Eosinophils Absolute: 107 cells/uL (ref 15–500)
Eosinophils Relative: 1.7 %
HCT: 38.5 % (ref 35.0–45.0)
Hemoglobin: 12.7 g/dL (ref 11.7–15.5)
Lymphs Abs: 1184 cells/uL (ref 850–3900)
MCH: 29.4 pg (ref 27.0–33.0)
MCHC: 33 g/dL (ref 32.0–36.0)
MCV: 89.1 fL (ref 80.0–100.0)
MPV: 9.5 fL (ref 7.5–12.5)
Monocytes Relative: 9.3 %
Neutro Abs: 4391 cells/uL (ref 1500–7800)
Neutrophils Relative %: 69.7 %
Platelets: 294 10*3/uL (ref 140–400)
RBC: 4.32 10*6/uL (ref 3.80–5.10)
RDW: 13.9 % (ref 11.0–15.0)
Total Lymphocyte: 18.8 %
WBC: 6.3 10*3/uL (ref 3.8–10.8)

## 2022-08-21 LAB — BRAIN NATRIURETIC PEPTIDE: Brain Natriuretic Peptide: 40 pg/mL (ref <100)

## 2022-08-21 MED ORDER — FUROSEMIDE 40 MG PO TABS: 40.0000 mg | ORAL_TABLET | Freq: Every day | ORAL | 0 refills | Status: DC | PRN

## 2022-08-21 MED ORDER — TIRZEPATIDE 2.5 MG/0.5ML ~~LOC~~ SOAJ: 2.5000 mg | SUBCUTANEOUS | 0 refills | Status: DC

## 2022-08-21 NOTE — Assessment & Plan Note (Signed)
Irrigation performed today.

## 2022-08-21 NOTE — Patient Instructions (Signed)
Please take your first furosemide this afternoon.  And then take it again tomorrow morning I like for you to take it daily for the next 3 days organ to see if this helps reduce your blood pressure as well as your ankle swelling, and improve your breathing. Should hopefully have your chest x-ray back either this afternoon or first thing in the morning so if we need to change something in regards to antibiotics etc. we can. Your nebulizer machine to administer DuoNeb also known as Atrovent/albuterol 4 times a day so approximately 4 to 5 hours apart.  If you are improving over the next 3 days then you can decrease the neb utilizer treatments down to 3 times a day and then taper over the next week if doing well. Should have some of your blood work back tomorrow.

## 2022-08-21 NOTE — Progress Notes (Signed)
Established Patient Office Visit  Subjective   Patient ID: Kathleen Le, female    DOB: 06-11-1961  Age: 61 y.o. MRN: [id number]  Chief Complaint  Patient presents with   Shortness of Breath    O2 88% at rest.     HPI  She reports that in general she just has not felt well since she fell back in December.  She has had 3 operations and been on multiple rounds of antibiotics for her left elbow.  In fact she is still currently on doxycycline 100 mg twice a day and says she will finish up her medication tomorrow.  She has noted in the last month or so that she has been more short of breath than usual.  She has had an increase in cough but not really increasing sputum production.  She says she has chronic ankle swelling but feels like that is also been worse in the last several weeks.  She has been checking her oxygen at home and says for the last 3 weeks she has been waking up with her oxygen level in the 70s.  She has been starting to prop herself up to sleep in the evening.  She is also concerned that she has gained a lot of weight since December.  He has been more sedentary because she cannot do a lot because of her shoulder.  Her weight is actually up about 8 pounds in the last 4 weeks.  Pressure is also elevated today and normally it looks fantastic.     ROS    Objective:     BP (!) 161/73   Pulse 99   Wt 288 lb (130.6 kg)   SpO2 (!) 88% Comment: At rest  BMI 46.48 kg/m    Physical Exam Constitutional:      Appearance: She is well-developed.  HENT:     Head: Normocephalic and atraumatic.     Right Ear: External ear normal.     Left Ear: External ear normal.     Ears:     Comments: Tms blocked by cerumen     Nose: Nose normal.     Mouth/Throat:     Mouth: Mucous membranes are dry.  Eyes:     Conjunctiva/sclera: Conjunctivae normal.     Pupils: Pupils are equal, round, and reactive to light.  Neck:     Thyroid: No thyromegaly.  Cardiovascular:     Rate and  Rhythm: Normal rate and regular rhythm.     Heart sounds: Normal heart sounds.  Pulmonary:     Effort: Pulmonary effort is normal.     Breath sounds: Wheezing present.     Comments: expiratory wheezing bilaterally  Musculoskeletal:     Cervical back: Neck supple.  Lymphadenopathy:     Cervical: No cervical adenopathy.  Skin:    General: Skin is warm and dry.  Neurological:     Mental Status: She is alert and oriented to person, place, and time.      No results found for any visits on 08/21/22.    The 10-year ASCVD risk score (Arnett DK, et al., 2019) is: 20.6%    Assessment & Plan:   Problem List Items Addressed This Visit       Respiratory   Chronic obstructive pulmonary disease ([place]) - Primary   Relevant Orders   CBC with Differential/Platelet   B Nat Peptide   COMPLETE METABOLIC PANEL WITH GFR   DG Chest 2 View     Endocrine  Type II diabetes mellitus, uncontrolled   Relevant Medications   tirzepatide (MOUNJARO) 2.5 MG/0.5ML Pen     Nervous and Auditory   Cerumen impaction    Irrigation performed today.      Other Visit Diagnoses     SOB (shortness of breath)       Relevant Orders   CBC with Differential/Platelet   B Nat Peptide   COMPLETE METABOLIC PANEL WITH GFR   DG Chest 2 View   Hypoxemia       Relevant Orders   CBC with Differential/Platelet   B Nat Peptide   COMPLETE METABOLIC PANEL WITH GFR   DG Chest 2 View   Bilateral hearing loss due to cerumen impaction           Shortness of breath-I strongly suspect that she is volume overloaded versus a true COPD exacerbation especially since she is already on doxycycline.  Though I am going to have her go ahead and start using her nebulizer 4 times a day for the next few days.  I am also can add furosemide daily for the next 3 days to see if she feels better.  If blood pressure comes down, ankle swelling improves and shortness of breath improves.  We will get a stat chest x-ray today.  As well as  a BNP.  If the BNP is elevated then we will move forward with further work-up including echocardiogram.  PCP can take over at that point.  Still consider COPD exacerbation.  I think pulmonary embolism is less likely but certainly a possibility if she is getting worse or not improving.  Continue to monitor pulse ox is at home.  Hypoxemia-again we will go ahead and get stat chest x-ray we will have her use the nebulizer.  She is already on doxycycline.  I am to have her take the furosemide as soon as she gets home.  And let us know what the oxygen is doing tomorrow.  If not improving then may need to get supplemental oxygen and work-up further.  Indication: Cerumen impaction of the ear(s) Medical necessity statement: On physical examination, cerumen impairs clinically significant portions of the external auditory canal, and tympanic membrane. Noted obstructive, copious cerumen that cannot be removed without magnification and instrumentations.  Consent: Discussed benefits and risks of procedure and verbal consent obtained Procedure: Patient was prepped for the procedure. Utilized an otoscope to assess and take note of the ear canal, the tympanic membrane, and the presence, amount, and placement of the cerumen. Gentle water irrigation and soft plastic curette was utilized to remove cerumen.  Post procedure examination: shows cerumen was completely removed. Patient tolerated procedure well. The patient is made aware that they may experience temporary vertigo, temporary hearing loss, and temporary discomfort. If these symptom last for more than 24 hours to call the clinic or proceed to the ED.    Return in about 8 days (around 08/29/2022) for shortness of breath.    Beatrice Lecher, MD

## 2022-08-21 NOTE — Progress Notes (Signed)
Hi Kathleen Le, they did not see any sign of pneumonia on your chest x-ray which is very reassuring.  I still want you to do the nebulizer treatments 4 times a day and try the diuretic over the next couple of days to see if you feel better and if your swelling is improving and your blood pressure is improving and your shortness of breath is improving.  We should hopefully have some of your lab work back later today or tomorrow.

## 2022-08-22 ENCOUNTER — Encounter: Payer: Self-pay | Admitting: Family Medicine

## 2022-08-22 ENCOUNTER — Telehealth: Payer: Self-pay | Admitting: Family Medicine

## 2022-08-22 DIAGNOSIS — J441 Chronic obstructive pulmonary disease with (acute) exacerbation: Secondary | ICD-10-CM | POA: Diagnosis not present

## 2022-08-22 DIAGNOSIS — R0902 Hypoxemia: Secondary | ICD-10-CM | POA: Diagnosis not present

## 2022-08-22 DIAGNOSIS — R0602 Shortness of breath: Secondary | ICD-10-CM | POA: Diagnosis not present

## 2022-08-22 LAB — D-DIMER, QUANTITATIVE: D-Dimer, Quant: 1.19 mcg/mL FEU — ABNORMAL HIGH (ref <0.50)

## 2022-08-22 MED ORDER — AZITHROMYCIN 250 MG PO TABS: ORAL_TABLET | ORAL | 0 refills | Status: AC

## 2022-08-22 MED ORDER — PREDNISONE 20 MG PO TABS: 40.0000 mg | ORAL_TABLET | Freq: Every day | ORAL | 0 refills | Status: DC

## 2022-08-22 NOTE — Progress Notes (Signed)
Called patient and spoke to her about elevated D-dimer and potential risk for pulmonary embolism which could explain her hypoxemia because right now I do not have any other good explanation.  She really is not feeling any better today.  Urged her to go to the emergency department for further work-up will likely need CT angio but will leave that up to the providers there.

## 2022-08-22 NOTE — Telephone Encounter (Signed)
Attempted to contact the patient. No answer, left a detailed vm msg regarding the reason for call. Patient informed to return a call back as soon as possible. Direct call back info provided.

## 2022-08-22 NOTE — Telephone Encounter (Signed)
Patient viewed Dr [name] message.

## 2022-08-22 NOTE — Addendum Note (Signed)
Addended by: Beatrice Lecher D on: 08/22/2022 08:15 AM   Modules accepted: Orders

## 2022-08-22 NOTE — Telephone Encounter (Signed)
In addition to the result note I really like for her to come by and have a stat D-dimer drawn.  I just want to make sure there is not a blood clot especially since she has not been as active because of her elbow.

## 2022-08-22 NOTE — Telephone Encounter (Signed)
Patient advised. She will try to come by today.

## 2022-08-22 NOTE — Progress Notes (Signed)
Hi Kelleigh, electrolytes are normal and kidney function is stable.  Blood count is normal.  BNP is actually normal so no sign of heart failure.  I am also going to send over a course of prednisone and a Z-Pak so go ahead and start that as well.  If your oxygen gets worse over the weekend please go to the emergency department and if its not getting better but its not worse by Monday then please let us know as well.  Orders Placed This Encounter     furosemide (LASIX) 40 MG tablet         Sig: Take 1 tablet (40 mg total) by mouth daily as needed.         Dispense:  30 tablet         Refill:  0     tirzepatide (MOUNJARO) 2.5 MG/0.5ML Pen         Sig: Inject 2.5 mg into the skin once a week.         Dispense:  2 mL         Refill:  0     azithromycin (ZITHROMAX) 250 MG tablet         Sig: 2 Ttabs PO on Day 1, then one a day x 4 days.         Dispense:  6 tablet         Refill:  0     predniSONE (DELTASONE) 20 MG tablet         Sig: Take 2 tablets (40 mg total) by mouth daily with breakfast.         Dispense:  10 tablet         Refill:  0

## 2022-08-25 ENCOUNTER — Encounter: Payer: Self-pay | Admitting: Family Medicine

## 2022-08-26 ENCOUNTER — Other Ambulatory Visit: Payer: Self-pay

## 2022-08-26 ENCOUNTER — Encounter: Payer: Self-pay | Admitting: Orthopaedic Surgery

## 2022-08-26 ENCOUNTER — Ambulatory Visit (INDEPENDENT_AMBULATORY_CARE_PROVIDER_SITE_OTHER): Payer: 59 | Admitting: Orthopaedic Surgery

## 2022-08-26 DIAGNOSIS — S51002A Unspecified open wound of left elbow, initial encounter: Secondary | ICD-10-CM

## 2022-08-26 DIAGNOSIS — S46312A Strain of muscle, fascia and tendon of triceps, left arm, initial encounter: Secondary | ICD-10-CM | POA: Diagnosis not present

## 2022-08-26 MED ORDER — LIDOCAINE HCL 1 % IJ SOLN
1.0000 mL | INTRAMUSCULAR | Status: AC | PRN
  Administered 2022-08-26: 1 mL

## 2022-08-26 MED ORDER — BUPIVACAINE HCL 0.5 % IJ SOLN
1.0000 mL | INTRAMUSCULAR | Status: AC | PRN
  Administered 2022-08-26: 1 mL via INTRA_ARTICULAR

## 2022-08-26 NOTE — Progress Notes (Signed)
Office Visit Note   Patient: Kathleen Le           Date of Birth: 1961/04/26           MRN: [id number] Visit Date: 08/26/2022              Requested by: Donella Stade, PA-C [address] [place],  [place] [id number] PCP: Donella Stade, PA-C   Assessment & Plan: Visit Diagnoses:  1. Triceps tendon rupture, left, initial encounter   2. Elbow wound, left, initial encounter     Plan: Kathleen Le returns today for recurrent left elbow swelling.  She is about 5 weeks status post I&D of the left elbow wound.  Recently was admitted to the hospital for COPD exacerbation.  She has been off antibiotics for about a week.  States that the swelling started just prior to the hospitalization.  Examination left elbow shows fluctuance without any cellulitis or drainage.  Painless range of motion of the elbow.  I aspirated the elbow and obtained about a cc of yellowish fluid.  This was sent for cell count, Gram stain, culture.  Will be in touch with Kathleen Le regarding the results.  Follow-Up Instructions: No follow-ups on file.   Orders:  No orders of the defined types were placed in this encounter.  No orders of the defined types were placed in this encounter.     Procedures: Medium Joint Inj: L olecranon bursa on 08/26/2022 2:06 PM Details: 22 G needle Medications: 1 mL lidocaine 1 %; 1 mL bupivacaine 0.5 % Aspirate: 8 mL yellow; sent for lab analysis Outcome: tolerated well, no immediate complications      Clinical Data: No additional findings.   Subjective: Chief Complaint  Patient presents with   Left Elbow - Follow-up    Left elbow I&D 07/18/2022    HPI  Review of Systems   Objective: Vital Signs: There were no vitals taken for this visit.  Physical Exam  Ortho Exam  Specialty Comments:  No specialty comments available.  Imaging: No results found.   PMFS History: Patient Active Problem List   Diagnosis Date Noted   Bursitis of elbow  05/28/2022   Elbow wound, left, initial encounter 05/11/2022   Centrilobular emphysema ([place]) 05/09/2022   Triceps tendon rupture, left, initial encounter 02/07/2022   Fall 12/31/2021   Contusion of left arm 12/31/2021   Acute left-sided low back pain without sciatica 12/31/2021   Neck pain 12/31/2021   Encounter for smoking cessation counseling 11/15/2021   Chronic obstructive pulmonary disease ([place]) 02/26/2021   Hyperlipidemia associated with type 2 diabetes mellitus ([place]) 11/05/2020   Non-restorative sleep 11/05/2020   Snoring 11/05/2020   Anterolisthesis 07/30/2020   Acute stress reaction 09/12/2018   Current smoker 06/08/2018   Diabetes mellitus, type II, insulin dependent ([place]) 06/08/2018   Claustrophobia 06/08/2018   Primary insomnia 06/08/2018   Hot flashes 06/08/2018   Abnormal weight gain 06/08/2018   Lower leg edema 11/10/2017   Insomnia due to other mental disorder 10/27/2017   History of substance abuse ([place]) 10/27/2017   GAD (generalized anxiety disorder) 10/27/2017   Class 3 severe obesity due to excess calories with serious comorbidity and body mass index (BMI) of 40.0 to 44.9 in adult [facility]) 08/31/2017   Cerumen impaction 01/24/2015   Hyperlipidemia 01/24/2015   Gastroesophageal reflux disease with esophagitis 01/10/2015   Foot callus 10/03/2014   PTSD (post-traumatic stress disorder) 09/26/2014   Depression 05/19/2014   Anxiety 05/19/2014  Chronic bilateral low back pain with right-sided sciatica 05/19/2014   DDD (degenerative disc disease), lumbar 05/19/2014   Neuropathy 02/05/2014   IT band syndrome 02/05/2014   Sinus tachycardia 02/05/2014   Memory changes 02/05/2014   Word finding difficulty 02/05/2014   Cervical cancer ([place]) 02/05/2014   Type II diabetes mellitus, uncontrolled 02/03/2014   Unspecified asthma(493.90) 02/03/2014   Essential hypertension, benign 02/03/2014   Pulmonary emphysema ([place]) 07/26/2012   Past Medical History:  Diagnosis Date    Anxiety    Arthritis    Asthma    Cancer ([place])    COPD (chronic obstructive pulmonary disease) ([place])    Degenerative disc disease, lumbar    Diabetes mellitus without complication (HCC)    Dyspnea    with exertion due to COPD   GERD (gastroesophageal reflux disease)    History of alcohol abuse    20-30 years ago, patient was going through divorce but does not currently drink any alcohol   History of marijuana use    "its been a long time since I last smoked because of my COPD"   History of ovarian cancer 1984   Hypertension    Pneumonia    Substance abuse ([place])    patient denies, states she has only ever used marijuana    Family History  Problem Relation Age of Onset   Cancer Paternal Grandmother    Diabetes Paternal Grandmother    Diabetes Paternal Grandfather    Heart attack Maternal Aunt    Cancer Maternal Uncle    Cancer Maternal Grandmother    Heart attack Mother    Cancer Mother    Stroke Mother    Diabetes Father    Heart attack Father    Diabetes Paternal Uncle    Diabetes Paternal Uncle     Past Surgical History:  Procedure Laterality Date   DILATION AND CURETTAGE OF UTERUS     ECTOPIC PREGNANCY SURGERY     patient stated she had to have tube removed   I & D EXTREMITY Left 05/12/2022   Procedure: IRRIGATION AND DEBRIDEMENT LEFT ELBOW;  Surgeon: Leandrew Koyanagi, MD;  Location: [facility];  Service: Orthopedics;  Laterality: Left;   I & D EXTREMITY Left 07/18/2022   Procedure: LEFT ELBOW IRRIGATION AND DEBRIDEMENT EXTREMITY;  Surgeon: Leandrew Koyanagi, MD;  Location: [facility];  Service: Orthopedics;  Laterality: Left;   TONSILLECTOMY     TRICEPS TENDON REPAIR Left 02/07/2022   Procedure: left triceps repair;  Surgeon: Leandrew Koyanagi, MD;  Location: [facility];  Service: Orthopedics;  Laterality: Left;   Social History   Occupational History   Not on file  Tobacco Use   Smoking status: Every Day    Packs/day: 1.00    Types: Cigarettes   Smokeless  tobacco: Never   Tobacco comments:    1 pack a day   Vaping Use   Vaping Use: Never used  Substance and Sexual Activity   Alcohol use: Not Currently   Drug use: Not Currently    Types: Marijuana    Comment: no longer smokes marijuana due to COPD   Sexual activity: Yes    Birth control/protection: Post-menopausal

## 2022-08-27 ENCOUNTER — Encounter: Payer: Self-pay | Admitting: Orthopaedic Surgery

## 2022-08-27 NOTE — Telephone Encounter (Signed)
Since she already had prednisone in the hospital she does not need to take the extra I sent in,  at least not now.  She can hang onto it. as far as NPH (novolin),  its the insulin listed on her medication list.  So maybe she is not taking that?  I have absolutely no idea?

## 2022-08-28 ENCOUNTER — Encounter: Payer: Self-pay | Admitting: Orthopaedic Surgery

## 2022-08-28 NOTE — Telephone Encounter (Signed)
thanks

## 2022-08-28 NOTE — Telephone Encounter (Signed)
Lauren are you able to see the results in epic?  I cannot.

## 2022-08-29 ENCOUNTER — Encounter: Payer: Self-pay | Admitting: Physician Assistant

## 2022-08-29 ENCOUNTER — Ambulatory Visit (INDEPENDENT_AMBULATORY_CARE_PROVIDER_SITE_OTHER): Payer: 59 | Admitting: Physician Assistant

## 2022-08-29 ENCOUNTER — Other Ambulatory Visit: Payer: Self-pay | Admitting: Physician Assistant

## 2022-08-29 VITALS — BP 124/62 | HR 72 | Ht 66.0 in | Wt 288.0 lb

## 2022-08-29 DIAGNOSIS — J432 Centrilobular emphysema: Secondary | ICD-10-CM

## 2022-08-29 DIAGNOSIS — E1165 Type 2 diabetes mellitus with hyperglycemia: Secondary | ICD-10-CM | POA: Diagnosis not present

## 2022-08-29 DIAGNOSIS — J441 Chronic obstructive pulmonary disease with (acute) exacerbation: Secondary | ICD-10-CM | POA: Diagnosis not present

## 2022-08-29 DIAGNOSIS — E119 Type 2 diabetes mellitus without complications: Secondary | ICD-10-CM

## 2022-08-29 NOTE — Patient Instructions (Signed)
Stay smoke free Sugars in the morning under 130 and sugars in the evening under 180

## 2022-08-29 NOTE — Progress Notes (Signed)
   Established Patient Office Visit  Subjective   Patient ID: Kathleen Le, female    DOB: 1961-11-19  Age: 61 y.o. MRN: [id number]  Chief Complaint  Patient presents with  . Hospitalization Follow-up    HPI  Stopped smoking 1 week.  Off prednisone Done with antibiotid Zpak Trelegy Albuterol 02 97 percent Pulmonology  Left house 220   8/26  [place] pulmnology {History (Optional):23778}  ROS    Objective:     BP 124/62   Pulse 72   Ht '5\' 6"'$  (1.676 m)   Wt 288 lb (130.6 kg)   SpO2 97%   BMI 46.48 kg/m  {Vitals History (Optional):23777}  Physical Exam   No results found for any visits on 08/29/22.  {Labs (Optional):23779}  The 10-year ASCVD risk score (Arnett DK, et al., 2019) is: 12.7%    Assessment & Plan:   Problem List Items Addressed This Visit   None   No follow-ups on file.    Iran Planas, PA-C

## 2022-08-29 NOTE — Telephone Encounter (Signed)
Patient has an appointment today

## 2022-09-02 ENCOUNTER — Encounter: Payer: Self-pay | Admitting: Orthopaedic Surgery

## 2022-09-02 ENCOUNTER — Encounter: Payer: Self-pay | Admitting: Physician Assistant

## 2022-09-02 DIAGNOSIS — J441 Chronic obstructive pulmonary disease with (acute) exacerbation: Secondary | ICD-10-CM | POA: Insufficient documentation

## 2022-09-02 HISTORY — DX: Chronic obstructive pulmonary disease with (acute) exacerbation: J44.1

## 2022-09-02 LAB — ANAEROBIC AND AEROBIC CULTURE
AER RESULT:: NO GROWTH
MICRO NUMBER:: 13846684
MICRO NUMBER:: 13846685
SPECIMEN QUALITY:: ADEQUATE
SPECIMEN QUALITY:: ADEQUATE

## 2022-09-02 LAB — SYNOVIAL FLUID ANALYSIS, COMPLETE
Basophils, %: 0 %
Eosinophils-Synovial: 0 % (ref 0–2)
Lymphocytes-Synovial Fld: 18 % (ref 0–74)
Monocyte/Macrophage: 4 % (ref 0–69)
Neutrophil, Synovial: 78 % — ABNORMAL HIGH (ref 0–24)
Synoviocytes, %: 0 % (ref 0–15)
WBC, Synovial: 15120 cells/uL — ABNORMAL HIGH (ref <150)

## 2022-09-02 NOTE — Telephone Encounter (Signed)
Called patient about labs.  We're going to sit tight for now.  I would like her to come back to office in 2 weeks for recheck.  Thanks.

## 2022-09-03 ENCOUNTER — Other Ambulatory Visit: Payer: Self-pay | Admitting: Physician Assistant

## 2022-09-03 DIAGNOSIS — F40240 Claustrophobia: Secondary | ICD-10-CM

## 2022-09-03 DIAGNOSIS — F5101 Primary insomnia: Secondary | ICD-10-CM

## 2022-09-05 ENCOUNTER — Encounter: Payer: Self-pay | Admitting: Orthopaedic Surgery

## 2022-09-07 ENCOUNTER — Other Ambulatory Visit: Payer: Self-pay | Admitting: Physician Assistant

## 2022-09-09 ENCOUNTER — Ambulatory Visit (INDEPENDENT_AMBULATORY_CARE_PROVIDER_SITE_OTHER): Payer: 59

## 2022-09-09 ENCOUNTER — Ambulatory Visit (INDEPENDENT_AMBULATORY_CARE_PROVIDER_SITE_OTHER): Payer: 59 | Admitting: Orthopaedic Surgery

## 2022-09-09 DIAGNOSIS — S51002A Unspecified open wound of left elbow, initial encounter: Secondary | ICD-10-CM

## 2022-09-09 MED ORDER — DOXYCYCLINE HYCLATE 100 MG PO TABS: 100.0000 mg | ORAL_TABLET | Freq: Two times a day (BID) | ORAL | 0 refills | Status: DC

## 2022-09-09 NOTE — Progress Notes (Signed)
Post-Op Visit Note   Patient: Kathleen Le           Date of Birth: 1961-02-19           MRN: [id number] Visit Date: 09/09/2022 PCP: Donella Stade, PA-C   Assessment & Plan:  Chief Complaint:  Chief Complaint  Patient presents with   Left Elbow - Pain   Visit Diagnoses:  1. Elbow wound, left, initial encounter     Plan: Kathleen Le is 7 weeks status post I&D left elbow wound.  Drainage began this past weekend.  She has been off of antibiotics.  Denies any constitutional symptoms or pain.  Examination of left elbow shows drainage from the posterior aspect of the elbow.  This is clear drainage.  There is some mild local erythema.  No cellulitis.  No frank purulence.  I aspirated the elbow and obtained about 1 cc of fluid.  This will be sent for cultures.  I will place her on doxycycline.  May need to consider repeating MRI to rule out deeper infection.  Follow-Up Instructions: No follow-ups on file.   Orders:  Orders Placed This Encounter  Procedures   XR Elbow 2 Views Left   No orders of the defined types were placed in this encounter.   Imaging: No results found.  PMFS History: Patient Active Problem List   Diagnosis Date Noted   COPD exacerbation ([place]) 09/02/2022   Bursitis of elbow 05/28/2022   Elbow wound, left, initial encounter 05/11/2022   Centrilobular emphysema ([place]) 05/09/2022   Triceps tendon rupture, left, initial encounter 02/07/2022   Fall 12/31/2021   Contusion of left arm 12/31/2021   Acute left-sided low back pain without sciatica 12/31/2021   Neck pain 12/31/2021   Encounter for smoking cessation counseling 11/15/2021   Chronic obstructive pulmonary disease ([place]) 02/26/2021   Hyperlipidemia associated with type 2 diabetes mellitus ([place]) 11/05/2020   Non-restorative sleep 11/05/2020   Snoring 11/05/2020   Anterolisthesis 07/30/2020   Acute stress reaction 09/12/2018   Current smoker 06/08/2018   Diabetes mellitus, type II, insulin dependent  ([place]) 06/08/2018   Claustrophobia 06/08/2018   Primary insomnia 06/08/2018   Hot flashes 06/08/2018   Abnormal weight gain 06/08/2018   Lower leg edema 11/10/2017   Insomnia due to other mental disorder 10/27/2017   History of substance abuse ([place]) 10/27/2017   GAD (generalized anxiety disorder) 10/27/2017   Class 3 severe obesity due to excess calories with serious comorbidity and body mass index (BMI) of 40.0 to 44.9 in adult ([place]) 08/31/2017   Cerumen impaction 01/24/2015   Hyperlipidemia 01/24/2015   Gastroesophageal reflux disease with esophagitis 01/10/2015   Foot callus 10/03/2014   PTSD (post-traumatic stress disorder) 09/26/2014   Depression 05/19/2014   Anxiety 05/19/2014   Chronic bilateral low back pain with right-sided sciatica 05/19/2014   DDD (degenerative disc disease), lumbar 05/19/2014   Neuropathy 02/05/2014   IT band syndrome 02/05/2014   Sinus tachycardia 02/05/2014   Memory changes 02/05/2014   Word finding difficulty 02/05/2014   Cervical cancer ([place]) 02/05/2014   Type II diabetes mellitus, uncontrolled 02/03/2014   Unspecified asthma(493.90) 02/03/2014   Essential hypertension, benign 02/03/2014   Pulmonary emphysema ([place]) 07/26/2012   Past Medical History:  Diagnosis Date   Anxiety    Arthritis    Asthma    Cancer ([place])    COPD (chronic obstructive pulmonary disease) ([place])    Degenerative disc disease, lumbar    Diabetes mellitus without complication (HCC)    Dyspnea  with exertion due to COPD   GERD (gastroesophageal reflux disease)    History of alcohol abuse    20-30 years ago, patient was going through divorce but does not currently drink any alcohol   History of marijuana use    "its been a long time since I last smoked because of my COPD"   History of ovarian cancer 1984   Hypertension    Pneumonia    Substance abuse ([place])    patient denies, states she has only ever used marijuana    Family History  Problem Relation Age of Onset    Cancer Paternal Grandmother    Diabetes Paternal Grandmother    Diabetes Paternal Grandfather    Heart attack Maternal Aunt    Cancer Maternal Uncle    Cancer Maternal Grandmother    Heart attack Mother    Cancer Mother    Stroke Mother    Diabetes Father    Heart attack Father    Diabetes Paternal Uncle    Diabetes Paternal Uncle     Past Surgical History:  Procedure Laterality Date   DILATION AND CURETTAGE OF UTERUS     ECTOPIC PREGNANCY SURGERY     patient stated she had to have tube removed   I & D EXTREMITY Left 05/12/2022   Procedure: IRRIGATION AND DEBRIDEMENT LEFT ELBOW;  Surgeon: Leandrew Koyanagi, MD;  Location: [facility];  Service: Orthopedics;  Laterality: Left;   I & D EXTREMITY Left 07/18/2022   Procedure: LEFT ELBOW IRRIGATION AND DEBRIDEMENT EXTREMITY;  Surgeon: Leandrew Koyanagi, MD;  Location: [facility];  Service: Orthopedics;  Laterality: Left;   TONSILLECTOMY     TRICEPS TENDON REPAIR Left 02/07/2022   Procedure: left triceps repair;  Surgeon: Leandrew Koyanagi, MD;  Location: [facility];  Service: Orthopedics;  Laterality: Left;   Social History   Occupational History   Not on file  Tobacco Use   Smoking status: Former    Packs/day: 1.00    Types: Cigarettes    Quit date: 08/22/2022    Years since quitting: 0.0   Smokeless tobacco: Never   Tobacco comments:    1 pack a day   Vaping Use   Vaping Use: Never used  Substance and Sexual Activity   Alcohol use: Not Currently   Drug use: Not Currently    Types: Marijuana    Comment: no longer smokes marijuana due to COPD   Sexual activity: Yes    Birth control/protection: Post-menopausal

## 2022-09-11 ENCOUNTER — Encounter: Payer: Self-pay | Admitting: Orthopaedic Surgery

## 2022-09-11 ENCOUNTER — Other Ambulatory Visit: Payer: Self-pay | Admitting: Physician Assistant

## 2022-09-11 DIAGNOSIS — I10 Essential (primary) hypertension: Secondary | ICD-10-CM

## 2022-09-11 MED ORDER — HYDROCHLOROTHIAZIDE 25 MG PO TABS: 25.0000 mg | ORAL_TABLET | Freq: Every day | ORAL | 0 refills | Status: DC

## 2022-09-11 MED ORDER — MELOXICAM 15 MG PO TABS: 15.0000 mg | ORAL_TABLET | Freq: Every day | ORAL | 0 refills | Status: DC

## 2022-09-12 ENCOUNTER — Other Ambulatory Visit: Payer: Self-pay

## 2022-09-12 DIAGNOSIS — S51002A Unspecified open wound of left elbow, initial encounter: Secondary | ICD-10-CM

## 2022-09-15 ENCOUNTER — Encounter: Payer: Self-pay | Admitting: Orthopaedic Surgery

## 2022-09-15 ENCOUNTER — Other Ambulatory Visit: Payer: Self-pay | Admitting: Orthopaedic Surgery

## 2022-09-15 ENCOUNTER — Ambulatory Visit (INDEPENDENT_AMBULATORY_CARE_PROVIDER_SITE_OTHER): Payer: 59

## 2022-09-15 DIAGNOSIS — S51002A Unspecified open wound of left elbow, initial encounter: Secondary | ICD-10-CM | POA: Diagnosis not present

## 2022-09-15 DIAGNOSIS — Z872 Personal history of diseases of the skin and subcutaneous tissue: Secondary | ICD-10-CM | POA: Diagnosis not present

## 2022-09-15 DIAGNOSIS — M25422 Effusion, left elbow: Secondary | ICD-10-CM | POA: Diagnosis not present

## 2022-09-15 MED ORDER — GADOBUTROL 1 MMOL/ML IV SOLN
10.0000 mL | Freq: Once | INTRAVENOUS | Status: AC | PRN
  Administered 2022-09-15: 10 mL via INTRAVENOUS

## 2022-09-15 NOTE — Progress Notes (Signed)
Please have her come in Tuesday or Wednesday for appt to discuss MRI and treatment plan.  Thanks.

## 2022-09-15 NOTE — Progress Notes (Signed)
Tried to call patient to set up appointment. No answer. LMOM for patient to call and schedule follow up for MRI results.

## 2022-09-16 ENCOUNTER — Encounter (HOSPITAL_COMMUNITY): Payer: Self-pay | Admitting: Orthopaedic Surgery

## 2022-09-16 ENCOUNTER — Other Ambulatory Visit: Payer: Self-pay

## 2022-09-16 ENCOUNTER — Encounter: Payer: Self-pay | Admitting: Orthopaedic Surgery

## 2022-09-16 NOTE — Progress Notes (Signed)
For Short Stay: [place] appointment date: N/A Date of COVID positive in last 2 days:N/A  Bowel Prep reminder: N/A   For Anesthesia: PCP - Donella Stade, PA-C last office visit 08/29/22 in epic Cardiologist - N/A  Chest x-ray - 08/21/22 in epic EKG - 02/03/22 in epic Stress Test - N/A ECHO - N/A Cardiac Cath - N/A Pacemaker/ICD device last checked: N/A Pacemaker orders received: N/A Device Rep notified: N/A  Spinal Cord Stimulator: N/A  Sleep Study - 11/05/20 in epic CPAP - No  Fasting Blood Sugar - 112 Checks Blood Sugar __3___ times a day Date and result of last Hgb A1c-08/23/22 7.2 in epic  Blood Thinner Instructions: N/A Aspirin Instructions: ASA 81 mg Last Dose: 09/15/22  Activity level:  Unable to go up a flight of stairs without shortness of breath     Anesthesia review: COPD, DM, HTN, uses oxygen as needed  Patient denies shortness of breath, fever, cough and chest pain at PAT appointment   Patient verbalized understanding of instructions that were given to them at the PAT appointment. Patient was also instructed that they will need to review over the PAT instructions again at home before surgery.

## 2022-09-16 NOTE — Progress Notes (Signed)
Patients chart reviewed with Dr Kalman Shan, she had recent admission for exacerbation of COPD and was discharged with home oxygen. Dr Kalman Shan states patient will be better served being done at [facility]. Debbie at Dr Phoebe Sharps office made aware.

## 2022-09-17 ENCOUNTER — Ambulatory Visit (HOSPITAL_BASED_OUTPATIENT_CLINIC_OR_DEPARTMENT_OTHER): Payer: 59 | Admitting: Anesthesiology

## 2022-09-17 ENCOUNTER — Ambulatory Visit (HOSPITAL_COMMUNITY): Payer: 59 | Admitting: Anesthesiology

## 2022-09-17 ENCOUNTER — Encounter (HOSPITAL_COMMUNITY)
Admission: RE | Disposition: A | Discharge status: Home / Self Care | Payer: Self-pay | Source: Ambulatory Visit | Attending: Orthopaedic Surgery

## 2022-09-17 ENCOUNTER — Ambulatory Visit (HOSPITAL_COMMUNITY)
Admission: RE | Admit: 2022-09-17 | Discharge: 2022-09-17 | Disposition: A | Discharge status: Home / Self Care | Payer: 59 | Source: Ambulatory Visit | Attending: Orthopaedic Surgery | Admitting: Orthopaedic Surgery

## 2022-09-17 ENCOUNTER — Encounter (HOSPITAL_COMMUNITY): Payer: Self-pay | Admitting: Orthopaedic Surgery

## 2022-09-17 DIAGNOSIS — Z87891 Personal history of nicotine dependence: Secondary | ICD-10-CM | POA: Insufficient documentation

## 2022-09-17 DIAGNOSIS — S51002S Unspecified open wound of left elbow, sequela: Secondary | ICD-10-CM | POA: Diagnosis present

## 2022-09-17 DIAGNOSIS — E119 Type 2 diabetes mellitus without complications: Secondary | ICD-10-CM | POA: Insufficient documentation

## 2022-09-17 DIAGNOSIS — F1291 Cannabis use, unspecified, in remission: Secondary | ICD-10-CM | POA: Insufficient documentation

## 2022-09-17 DIAGNOSIS — T8143XA Infection following a procedure, organ and space surgical site, initial encounter: Secondary | ICD-10-CM | POA: Diagnosis not present

## 2022-09-17 DIAGNOSIS — K219 Gastro-esophageal reflux disease without esophagitis: Secondary | ICD-10-CM | POA: Diagnosis not present

## 2022-09-17 DIAGNOSIS — T8459XA Infection and inflammatory reaction due to other internal joint prosthesis, initial encounter: Secondary | ICD-10-CM | POA: Insufficient documentation

## 2022-09-17 DIAGNOSIS — X58XXXA Exposure to other specified factors, initial encounter: Secondary | ICD-10-CM | POA: Diagnosis not present

## 2022-09-17 DIAGNOSIS — M65822 Other synovitis and tenosynovitis, left upper arm: Secondary | ICD-10-CM | POA: Diagnosis not present

## 2022-09-17 DIAGNOSIS — Z7984 Long term (current) use of oral hypoglycemic drugs: Secondary | ICD-10-CM

## 2022-09-17 DIAGNOSIS — F418 Other specified anxiety disorders: Secondary | ICD-10-CM | POA: Diagnosis not present

## 2022-09-17 DIAGNOSIS — I10 Essential (primary) hypertension: Secondary | ICD-10-CM | POA: Diagnosis not present

## 2022-09-17 DIAGNOSIS — J449 Chronic obstructive pulmonary disease, unspecified: Secondary | ICD-10-CM

## 2022-09-17 DIAGNOSIS — R69 Illness, unspecified: Secondary | ICD-10-CM | POA: Diagnosis not present

## 2022-09-17 DIAGNOSIS — M009 Pyogenic arthritis, unspecified: Secondary | ICD-10-CM

## 2022-09-17 DIAGNOSIS — S51002A Unspecified open wound of left elbow, initial encounter: Secondary | ICD-10-CM

## 2022-09-17 DIAGNOSIS — L02414 Cutaneous abscess of left upper limb: Secondary | ICD-10-CM | POA: Diagnosis not present

## 2022-09-17 HISTORY — DX: Personal history of urinary calculi: Z87.442

## 2022-09-17 HISTORY — PX: HARDWARE REMOVAL: SHX979

## 2022-09-17 HISTORY — DX: Polyneuropathy, unspecified: G62.9

## 2022-09-17 HISTORY — PX: IRRIGATION AND DEBRIDEMENT ELBOW: SHX6886

## 2022-09-17 HISTORY — DX: Other specified soft tissue disorders: M79.89

## 2022-09-17 LAB — CBC
HCT: 35.7 % — ABNORMAL LOW (ref 36.0–46.0)
Hemoglobin: 11.5 g/dL — ABNORMAL LOW (ref 12.0–15.0)
MCH: 29.4 pg (ref 26.0–34.0)
MCHC: 32.2 g/dL (ref 30.0–36.0)
MCV: 91.3 fL (ref 80.0–100.0)
Platelets: 262 10*3/uL (ref 150–400)
RBC: 3.91 MIL/uL (ref 3.87–5.11)
RDW: 15.5 % (ref 11.5–15.5)
WBC: 5.9 10*3/uL (ref 4.0–10.5)
nRBC: 0 % (ref 0.0–0.2)

## 2022-09-17 LAB — BASIC METABOLIC PANEL
Anion gap: 8 (ref 5–15)
BUN: 24 mg/dL — ABNORMAL HIGH (ref 6–20)
CO2: 27 mmol/L (ref 22–32)
Calcium: 9.2 mg/dL (ref 8.9–10.3)
Chloride: 104 mmol/L (ref 98–111)
Creatinine, Ser: 0.85 mg/dL (ref 0.44–1.00)
GFR, Estimated: >60 mL/min (ref >60)
Glucose, Bld: 98 mg/dL (ref 70–99)
Potassium: 4.3 mmol/L (ref 3.5–5.1)
Sodium: 139 mmol/L (ref 135–145)

## 2022-09-17 LAB — GLUCOSE, CAPILLARY
Glucose-Capillary: 102 mg/dL — ABNORMAL HIGH (ref 70–99)
Glucose-Capillary: 95 mg/dL (ref 70–99)

## 2022-09-17 LAB — SEDIMENTATION RATE: Sed Rate: 20 mm/hr (ref 0–22)

## 2022-09-17 SURGERY — IRRIGATION AND DEBRIDEMENT ELBOW
Anesthesia: General | Site: Elbow | Laterality: Left

## 2022-09-17 MED ORDER — 0.9 % SODIUM CHLORIDE (POUR BTL) OPTIME
TOPICAL | Status: DC | PRN
  Administered 2022-09-17: 1000 mL

## 2022-09-17 MED ORDER — FENTANYL CITRATE (PF) 100 MCG/2ML IJ SOLN
INTRAMUSCULAR | Status: AC
  Filled 2022-09-17: qty 2

## 2022-09-17 MED ORDER — PROPOFOL 10 MG/ML IV BOLUS
INTRAVENOUS | Status: DC | PRN
  Administered 2022-09-17: 160 mg via INTRAVENOUS

## 2022-09-17 MED ORDER — PHENYLEPHRINE HCL (PRESSORS) 10 MG/ML IV SOLN
INTRAVENOUS | Status: DC | PRN
  Administered 2022-09-17: 80 ug via INTRAVENOUS

## 2022-09-17 MED ORDER — CHLORHEXIDINE GLUCONATE 0.12 % MT SOLN
15.0000 mL | Freq: Once | OROMUCOSAL | Status: AC
  Administered 2022-09-17: 15 mL via OROMUCOSAL

## 2022-09-17 MED ORDER — ORAL CARE MOUTH RINSE: 15.0000 mL | Freq: Once | OROMUCOSAL | Status: AC

## 2022-09-17 MED ORDER — OXYCODONE HCL 5 MG PO TABS
5.0000 mg | ORAL_TABLET | Freq: Once | ORAL | Status: AC | PRN
  Administered 2022-09-17: 5 mg via ORAL

## 2022-09-17 MED ORDER — OXYCODONE HCL 5 MG/5ML PO SOLN: 5.0000 mg | Freq: Once | ORAL | Status: AC | PRN

## 2022-09-17 MED ORDER — SODIUM CHLORIDE 0.9 % IR SOLN
Status: DC | PRN
  Administered 2022-09-17: 3000 mL

## 2022-09-17 MED ORDER — MIDAZOLAM HCL 5 MG/5ML IJ SOLN
INTRAMUSCULAR | Status: DC | PRN
  Administered 2022-09-17: 2 mg via INTRAVENOUS

## 2022-09-17 MED ORDER — VANCOMYCIN HCL 1000 MG IV SOLR
INTRAVENOUS | Status: DC | PRN
  Administered 2022-09-17: 1000 mg

## 2022-09-17 MED ORDER — HYDROMORPHONE HCL 1 MG/ML IJ SOLN
0.2500 mg | INTRAMUSCULAR | Status: DC | PRN
  Administered 2022-09-17 (×2): 0.5 mg via INTRAVENOUS

## 2022-09-17 MED ORDER — MEPERIDINE HCL 50 MG/ML IJ SOLN: 6.2500 mg | INTRAMUSCULAR | Status: DC | PRN

## 2022-09-17 MED ORDER — ACETAMINOPHEN 500 MG PO TABS
1000.0000 mg | ORAL_TABLET | Freq: Once | ORAL | Status: AC
  Administered 2022-09-17: 1000 mg via ORAL
  Filled 2022-09-17: qty 2

## 2022-09-17 MED ORDER — GLYCOPYRROLATE 0.2 MG/ML IJ SOLN
INTRAMUSCULAR | Status: DC | PRN
  Administered 2022-09-17: .2 mg via INTRAVENOUS

## 2022-09-17 MED ORDER — OXYCODONE HCL 5 MG PO TABS
ORAL_TABLET | ORAL | Status: AC
  Filled 2022-09-17: qty 1

## 2022-09-17 MED ORDER — CELECOXIB 200 MG PO CAPS
200.0000 mg | ORAL_CAPSULE | Freq: Once | ORAL | Status: AC
  Administered 2022-09-17: 200 mg via ORAL
  Filled 2022-09-17: qty 1

## 2022-09-17 MED ORDER — DEXAMETHASONE SODIUM PHOSPHATE 4 MG/ML IJ SOLN
INTRAMUSCULAR | Status: DC | PRN
  Administered 2022-09-17: 4 mg via INTRAVENOUS

## 2022-09-17 MED ORDER — DOXYCYCLINE HYCLATE 100 MG PO TABS: 100.0000 mg | ORAL_TABLET | Freq: Two times a day (BID) | ORAL | 2 refills | Status: DC

## 2022-09-17 MED ORDER — DEXMEDETOMIDINE HCL IN NACL 80 MCG/20ML IV SOLN
INTRAVENOUS | Status: AC
  Filled 2022-09-17: qty 20

## 2022-09-17 MED ORDER — PROMETHAZINE HCL 25 MG/ML IJ SOLN: 6.2500 mg | INTRAMUSCULAR | Status: DC | PRN

## 2022-09-17 MED ORDER — EPHEDRINE SULFATE (PRESSORS) 50 MG/ML IJ SOLN
INTRAMUSCULAR | Status: DC | PRN
  Administered 2022-09-17 (×3): 10 mg via INTRAVENOUS
  Administered 2022-09-17: 5 mg via INTRAVENOUS

## 2022-09-17 MED ORDER — LIDOCAINE HCL (CARDIAC) PF 100 MG/5ML IV SOSY
PREFILLED_SYRINGE | INTRAVENOUS | Status: DC | PRN
  Administered 2022-09-17: 100 mg via INTRAVENOUS

## 2022-09-17 MED ORDER — CEFAZOLIN IN SODIUM CHLORIDE 3-0.9 GM/100ML-% IV SOLN
INTRAVENOUS | Status: AC
  Filled 2022-09-17: qty 100

## 2022-09-17 MED ORDER — PROPOFOL 10 MG/ML IV BOLUS
INTRAVENOUS | Status: AC
  Filled 2022-09-17: qty 20

## 2022-09-17 MED ORDER — ONDANSETRON HCL 4 MG/2ML IJ SOLN
INTRAMUSCULAR | Status: DC | PRN
  Administered 2022-09-17: 4 mg via INTRAVENOUS

## 2022-09-17 MED ORDER — MIDAZOLAM HCL 2 MG/2ML IJ SOLN
INTRAMUSCULAR | Status: AC
  Filled 2022-09-17: qty 2

## 2022-09-17 MED ORDER — LACTATED RINGERS IV SOLN: INTRAVENOUS | Status: DC

## 2022-09-17 MED ORDER — VANCOMYCIN HCL 1000 MG IV SOLR
INTRAVENOUS | Status: AC
  Filled 2022-09-17: qty 20

## 2022-09-17 MED ORDER — ISOPROPYL ALCOHOL 70 % SOLN
Status: AC
  Filled 2022-09-17: qty 480

## 2022-09-17 MED ORDER — FENTANYL CITRATE (PF) 100 MCG/2ML IJ SOLN
INTRAMUSCULAR | Status: DC | PRN
  Administered 2022-09-17 (×2): 50 ug via INTRAVENOUS
  Administered 2022-09-17: 100 ug via INTRAVENOUS

## 2022-09-17 MED ORDER — HYDROMORPHONE HCL 1 MG/ML IJ SOLN
INTRAMUSCULAR | Status: AC
  Filled 2022-09-17: qty 1

## 2022-09-17 MED ORDER — DEXTROSE 5 % IV SOLN
INTRAVENOUS | Status: DC | PRN
  Administered 2022-09-17: 3 g via INTRAVENOUS

## 2022-09-17 MED ORDER — PHENYLEPHRINE 80 MCG/ML (10ML) SYRINGE FOR IV PUSH (FOR BLOOD PRESSURE SUPPORT)
PREFILLED_SYRINGE | INTRAVENOUS | Status: DC | PRN
  Administered 2022-09-17: 80 ug via INTRAVENOUS

## 2022-09-17 MED ORDER — DEXMEDETOMIDINE HCL IN NACL 80 MCG/20ML IV SOLN
INTRAVENOUS | Status: DC | PRN
  Administered 2022-09-17 (×2): 8 ug via BUCCAL
  Administered 2022-09-17: 4 ug via BUCCAL

## 2022-09-17 SURGICAL SUPPLY — 58 items
BAG COUNTER SPONGE SURGICOUNT (BAG) IMPLANT
BLADE HEX COATED 2.75 (ELECTRODE) ×1 IMPLANT
BLADE SURG SZ10 CARB STEEL (BLADE) ×1 IMPLANT
BNDG COHESIVE 1X5 TAN STRL LF (GAUZE/BANDAGES/DRESSINGS) IMPLANT
BNDG COHESIVE 4X5 TAN STRL LF (GAUZE/BANDAGES/DRESSINGS) ×1 IMPLANT
BNDG COHESIVE 6X5 TAN ST LF (GAUZE/BANDAGES/DRESSINGS) ×2 IMPLANT
BNDG ELASTIC 3X5.8 VLCR STR LF (GAUZE/BANDAGES/DRESSINGS) IMPLANT
BNDG ELASTIC 6X5.8 VLCR STR LF (GAUZE/BANDAGES/DRESSINGS) ×3 IMPLANT
BNDG STRETCH GAUZE 3IN X12FT (GAUZE/BANDAGES/DRESSINGS) IMPLANT
CORD BIPOLAR FORCEPS 12FT (ELECTRODE) IMPLANT
CUFF TOURN SGL QUICK 24 (TOURNIQUET CUFF)
CUFF TRNQT CYL 24X4X16.5-23 (TOURNIQUET CUFF) IMPLANT
DRAPE BILATERAL LIMB T (DRAPES) IMPLANT
DRAPE IMP U-DRAPE 54X76 (DRAPES) IMPLANT
DRAPE INCISE IOBAN 66X45 STRL (DRAPES) ×4 IMPLANT
DRAPE U-SHAPE 47X51 STRL (DRAPES) ×1 IMPLANT
DURAPREP 26ML APPLICATOR (WOUND CARE) ×1 IMPLANT
ELECT REM PT RETURN 15FT ADLT (MISCELLANEOUS) IMPLANT
FACESHIELD WRAPAROUND (MASK) IMPLANT
FACESHIELD WRAPAROUND OR TEAM (MASK) IMPLANT
GAUZE PAD ABD 8X10 STRL (GAUZE/BANDAGES/DRESSINGS) ×1 IMPLANT
GAUZE SPONGE 4X4 12PLY STRL (GAUZE/BANDAGES/DRESSINGS) ×2 IMPLANT
GAUZE XEROFORM 1X8 LF (GAUZE/BANDAGES/DRESSINGS) ×1 IMPLANT
GAUZE XEROFORM 5X9 LF (GAUZE/BANDAGES/DRESSINGS) ×1 IMPLANT
GLOVE BIOGEL PI IND STRL 7.0 (GLOVE) ×1 IMPLANT
GLOVE ECLIPSE 7.0 STRL STRAW (GLOVE) ×1 IMPLANT
GLOVE SKINSENSE STRL SZ7.5 (GLOVE) ×1 IMPLANT
GLOVE SURG POLYISO LF SZ7.5 (GLOVE) ×2 IMPLANT
GOWN STRL REUS W/ TWL LRG LVL3 (GOWN DISPOSABLE) ×1 IMPLANT
GOWN STRL REUS W/ TWL XL LVL3 (GOWN DISPOSABLE) ×1 IMPLANT
GOWN STRL REUS W/TWL LRG LVL3 (GOWN DISPOSABLE) ×1
GOWN STRL REUS W/TWL XL LVL3 (GOWN DISPOSABLE)
HANDPIECE INTERPULSE COAX TIP (DISPOSABLE)
KIT BASIN OR (CUSTOM PROCEDURE TRAY) ×1 IMPLANT
KIT TURNOVER KIT A (KITS) IMPLANT
MANIFOLD NEPTUNE II (INSTRUMENTS) ×1 IMPLANT
NS IRRIG 1000ML POUR BTL (IV SOLUTION) ×2 IMPLANT
PACK ORTHO EXTREMITY (CUSTOM PROCEDURE TRAY) ×1 IMPLANT
PADDING CAST COTTON 6X4 STRL (CAST SUPPLIES) ×1 IMPLANT
PENCIL SMOKE EVACUATOR (MISCELLANEOUS) IMPLANT
PROTECTOR NERVE ULNAR (MISCELLANEOUS) ×2 IMPLANT
SET HNDPC FAN SPRY TIP SCT (DISPOSABLE) IMPLANT
SET IRRIG Y TYPE TUR BLADDER L (SET/KITS/TRAYS/PACK) ×1 IMPLANT
SPLINT FIBERGLASS 4X30 (CAST SUPPLIES) IMPLANT
STOCKINETTE 8 INCH (MISCELLANEOUS) ×1 IMPLANT
SUT ETHILON 2 0 PSLX (SUTURE) ×1 IMPLANT
SUT ETHILON 3 0 PS 1 (SUTURE) ×2 IMPLANT
SUT MON AB 2-0 SH 27 (SUTURE) ×2
SUT MON AB 2-0 SH27 (SUTURE) IMPLANT
SUT VIC AB 2-0 CT1 36 (SUTURE) ×1 IMPLANT
SUT VIC AB 2-0 FS1 27 (SUTURE) ×2 IMPLANT
SWAB CULTURE ESWAB REG 1ML (MISCELLANEOUS) IMPLANT
SYR CONTROL 10ML LL (SYRINGE) IMPLANT
TOWEL OR 17X26 10 PK STRL BLUE (TOWEL DISPOSABLE) ×2 IMPLANT
TUBE FEEDING ENTERAL 5FR 16IN (TUBING) IMPLANT
TUBING CONNECTING 10 (TUBING) ×1 IMPLANT
UNDERPAD 30X36 HEAVY ABSORB (UNDERPADS AND DIAPERS) ×2 IMPLANT
YANKAUER SUCT BULB TIP NO VENT (SUCTIONS) ×1 IMPLANT

## 2022-09-17 NOTE — Anesthesia Postprocedure Evaluation (Signed)
Anesthesia Post Note  Patient: Kathleen Le  Procedure(s) Performed: LEFT IRRIGATION AND DEBRIDEMENT ELBOW (Left: Elbow) LEFT ELBOW HARDWARE REMOVAL (Left: Elbow)     Patient location during evaluation: PACU Anesthesia Type: General Level of consciousness: sedated and patient cooperative Pain management: pain level controlled Vital Signs Assessment: post-procedure vital signs reviewed and stable Respiratory status: spontaneous breathing Cardiovascular status: stable Anesthetic complications: no   No notable events documented.  Last Vitals:  Vitals:   09/17/22 1535 09/17/22 1603  BP: 124/63 (!) 141/66  Pulse: 61 65  Resp: 10 16  Temp: 36.4 C 36.5 C  SpO2: 96% 91%    Last Pain:  Vitals:   09/17/22 1603  TempSrc: Oral  PainSc:                  Nolon Nations

## 2022-09-17 NOTE — Discharge Instructions (Signed)

## 2022-09-17 NOTE — Anesthesia Preprocedure Evaluation (Signed)
Anesthesia Evaluation    Reviewed: Allergy & Precautions, Patient's Chart, lab work & pertinent test results, Unable to perform ROS - Chart review only  Airway Mallampati: III  TM Distance: >3 FB Neck ROM: Full    Dental  (+) Teeth Intact, Dental Advisory Given   Pulmonary shortness of breath, asthma , pneumonia, COPD, Current Smoker and Patient abstained from smoking., former smoker,    breath sounds clear to auscultation       Cardiovascular hypertension, Pt. on home beta blockers  Rhythm:Regular Rate:Normal     Neuro/Psych PSYCHIATRIC DISORDERS Anxiety Depression  Neuromuscular disease    GI/Hepatic GERD  ,(+)     substance abuse  marijuana use,   Endo/Other  diabetesMorbid obesity  Renal/GU      Musculoskeletal  (+) Arthritis ,   Abdominal (+) + obese,   Peds  Hematology   Anesthesia Other Findings   Reproductive/Obstetrics                             Anesthesia Physical  Anesthesia Plan  ASA: 3  Anesthesia Plan: General   Post-op Pain Management: Tylenol PO (pre-op)* and Celebrex PO (pre-op)*   Induction: Intravenous  PONV Risk Score and Plan: 3 and Ondansetron, Dexamethasone and Midazolam  Airway Management Planned: LMA  Additional Equipment: None  Intra-op Plan:   Post-operative Plan: Extubation in OR  Informed Consent:   Plan Discussed with:   Anesthesia Plan Comments:         Anesthesia Quick Evaluation

## 2022-09-17 NOTE — H&P (Signed)
PREOPERATIVE H&P  Chief Complaint: LEFT ELBOW ABSCESS  HPI: Kathleen Le is a 61 y.o. female who presents for surgical treatment of LEFT ELBOW ABSCESS.  She denies any changes in medical history.  Past Medical History:  Diagnosis Date   Anxiety    Arthritis    Asthma    Cancer ([place])    uterine   COPD (chronic obstructive pulmonary disease) (HCC)    Degenerative disc disease, lumbar    Diabetes mellitus without complication (HCC)    Dyspnea    with exertion due to COPD   GERD (gastroesophageal reflux disease)    History of alcohol abuse    20-30 years ago, patient was going through divorce but does not currently drink any alcohol   History of kidney stones    History of marijuana use    "its been a long time since I last smoked because of my COPD"   History of ovarian cancer 1984   Hypertension    Peripheral neuropathy    Pneumonia    Substance abuse ([place])    patient denies, states she has only ever used marijuana   Swelling of both lower extremities    Past Surgical History:  Procedure Laterality Date   CERVICAL CONIZATION W/BX  Ole[place]     patient stated she had to have tube removed   I & D EXTREMITY Left 05/12/2022   Procedure: IRRIGATION AND DEBRIDEMENT LEFT ELBOW;  Surgeon: Leandrew Koyanagi, MD;  Location: [facility];  Service: Orthopedics;  Laterality: Left;   I & D EXTREMITY Left 07/18/2022   Procedure: LEFT ELBOW IRRIGATION AND DEBRIDEMENT EXTREMITY;  Surgeon: Leandrew Koyanagi, MD;  Location: [facility];  Service: Orthopedics;  Laterality: Left;   TONSILLECTOMY     TRICEPS TENDON REPAIR Left 02/07/2022   Procedure: left triceps repair;  Surgeon: Leandrew Koyanagi, MD;  Location: [facility];  Service: Orthopedics;  Laterality: Left;   Social History   Socioeconomic History   Marital status: Married    Spouse name: Not on file   Number of children: Not on file   Years of education: Not on file    Highest education level: Not on file  Occupational History   Not on file  Tobacco Use   Smoking status: Former    Packs/day: 1.00    Types: Cigarettes    Quit date: 08/22/2022    Years since quitting: 0.0    Passive exposure: Never   Smokeless tobacco: Never   Tobacco comments:    1 pack a day   Vaping Use   Vaping Use: Never used  Substance and Sexual Activity   Alcohol use: Not Currently   Drug use: Not Currently    Types: Marijuana    Comment: no longer smokes marijuana due to COPD   Sexual activity: Yes    Birth control/protection: Post-menopausal  Other Topics Concern   Not on file  Social History Narrative   Not on file   Social Determinants of Health   Financial Resource Strain: Not on file  Food Insecurity: Not on file  Transportation Needs: Not on file  Physical Activity: Not on file  Stress: Not on file  Social Connections: Not on file   Family History  Problem Relation Age of Onset   Cancer Paternal Grandmother    Diabetes Paternal Grandmother    Diabetes Paternal Grandfather    Heart attack  Maternal Aunt    Cancer Maternal Uncle    Cancer Maternal Grandmother    Heart attack Mother    Cancer Mother    Stroke Mother    Diabetes Father    Heart attack Father    Diabetes Paternal Uncle    Diabetes Paternal Uncle    Allergies  Allergen Reactions   Gabapentin Nausea And Vomiting   Invokana [Canagliflozin] Other (See Comments)    Vaginitis    Latex Itching and Rash   Lyrica [Pregabalin] Other (See Comments)    Pain in feet increased and spasm   Prior to Admission medications   Medication Sig Start Date End Date Taking? Authorizing Provider  albuterol (VENTOLIN HFA) 108 (90 Base) MCG/ACT inhaler INHALE 1 TO 2 PUFFS INTO LUNGS EVERY 6 HOURS AS NEEDED FOR WHEEZING OR SHORTNESS OF BREATH Patient taking differently: Inhale 2 puffs into the lungs daily as needed for shortness of breath or wheezing. 09/08/22  Yes Breeback, [name] L, PA-C  amLODipine  (NORVASC) 5 MG tablet Take 1 tablet (5 mg total) by mouth daily. 05/06/22  Yes Breeback, [name] L, PA-C  aspirin 81 MG EC tablet Take 81 mg by mouth daily.   Yes [provider]  atenolol (TENORMIN) 50 MG tablet Take 1 tablet (50 mg total) by mouth daily. 05/06/22  Yes Breeback, [name] L, PA-C  Calcium Carbonate (CALCARB 600 PO) Take 600 mg by mouth daily.   Yes [provider]  doxepin (SINEQUAN) 25 MG capsule TAKE 1 TO 2 CAPSULES BY MOUTH AT BEDTIME AS NEEDED FOR SLEEP Patient taking differently: Take 25-50 mg by mouth at bedtime as needed (sleep/anxiety). 09/04/22  Yes Breeback, [name] L, PA-C  doxycycline (VIBRA-TABS) 100 MG tablet Take 1 tablet (100 mg total) by mouth 2 (two) times daily. 09/09/22 10/09/22 Yes Leandrew Koyanagi, MD  DULoxetine (CYMBALTA) 30 MG capsule Take 1 capsule by mouth twice daily Patient taking differently: Take 30 mg by mouth 2 (two) times daily. 07/18/22  Yes Breeback, [name] L, PA-C  esomeprazole (NEXIUM) 20 MG capsule Take 20 mg by mouth daily.   Yes [provider]  Flaxseed, Linseed, (FLAX SEED OIL) 1000 MG CAPS Take 1,000 mg by mouth daily.   Yes [provider]  Fluticasone-Umeclidin-Vilant (TRELEGY ELLIPTA) 100-62.5-25 MCG/ACT AEPB Inhale 1 puff into the lungs daily. 03/04/22  Yes Breeback, [name] L, PA-C  furosemide (LASIX) 40 MG tablet Take 1 tablet (40 mg total) by mouth daily as needed. Patient taking differently: Take 40 mg by mouth daily as needed for fluid or edema. 08/21/22  Yes Hali Marry, MD  glipiZIDE (GLUCOTROL) 10 MG tablet Take 1 tablet (10 mg total) by mouth 2 (two) times daily before a meal. 05/06/22  Yes Breeback, [name] L, PA-C  hydrochlorothiazide (HYDRODIURIL) 25 MG tablet Take 1 tablet (25 mg total) by mouth daily. 09/11/22  Yes Breeback, [name] L, PA-C  insulin NPH Human (NOVOLIN N RELION) 100 UNIT/ML injection Inject 0.4 mLs (40 Units total) into the skin at bedtime. Patient taking differently: Inject 10-15 Units into the  skin See admin instructions. Inject 10 units in the morning and 15 units at bedtime. 08/08/22  Yes Breeback, [name] L, PA-C  ipratropium-albuterol (DUONEB) 0.5-2.5 (3) MG/3ML SOLN Take 3 mLs by nebulization every 6 (six) hours as needed. Patient taking differently: Take 3 mLs by nebulization 2 (two) times daily as needed (wheezing/shortness of breath). 03/04/22  Yes Breeback, [name] L, PA-C  lisinopril (ZESTRIL) 5 MG tablet Take 1 tablet by mouth  once daily Patient taking differently: Take 5 mg by mouth daily. 09/02/22  Yes Breeback, [name] L, PA-C  magnesium gluconate (MAGONATE) 500 MG tablet Take 500 mg by mouth daily.   Yes [provider]  meloxicam (MOBIC) 15 MG tablet Take 1 tablet (15 mg total) by mouth daily. 09/11/22  Yes Breeback, [name] L, PA-C  metFORMIN (GLUCOPHAGE) 1000 MG tablet Take 1 tablet (1,000 mg total) by mouth 2 (two) times daily with a meal. 05/06/22  Yes Breeback, [name] L, PA-C  Multiple Vitamins-Minerals (MULTIVITAMIN WITH MINERALS) tablet Take 1 tablet by mouth daily.   Yes [provider]  nystatin (MYCOSTATIN) 100000 UNIT/ML suspension Take 5 mLs (500,000 Units total) by mouth 4 (four) times daily. Patient taking differently: Take 5 mLs by mouth daily as needed (thrush). 07/16/22  Yes Breeback, [name] L, PA-C  nystatin (MYCOSTATIN/NYSTOP) powder Apply 1 application. topically 3 (three) times daily. Patient taking differently: Apply 1 application  topically daily. 03/27/22  Yes Breeback, [name] L, PA-C  Omega-3 Fatty Acids (FISH OIL) 1000 MG CAPS Take 1,000 mg by mouth 2 (two) times daily.   Yes [provider]  OVER THE COUNTER MEDICATION Apply 1 application  topically at bedtime. CBD Cream. Apply to feet   Yes [provider]  pioglitazone (ACTOS) 45 MG tablet Take 1 tablet (45 mg total) by mouth daily. 05/06/22  Yes Breeback, [name] L, PA-C  Probiotic Product (PROBIOTIC DAILY PO) Take 1 capsule by mouth daily.   Yes [provider]  rosuvastatin  (CRESTOR) 20 MG tablet Take 1 tablet (20 mg total) by mouth daily. 05/06/22  Yes Breeback, [name] L, PA-C  sitaGLIPtin (JANUVIA) 100 MG tablet Take 1 tablet (100 mg total) by mouth daily. 05/06/22  Yes Breeback, [name] L, PA-C  AMBULATORY NON FORMULARY MEDICATION Blood sugar testing strips and lancets for TrueTrack glucometer.  Use to check blood sugar up to two times a day.  Dx: Type 2 Diabetes 01/04/16   Donella Stade, PA-C  AMBULATORY NON FORMULARY MEDICATION Please provide needles for Tresiba pens 01/08/16   Breeback, [name] L, PA-C  Blood Glucose Monitoring Suppl (ACCU-CHEK GUIDE ME) w/Device KIT USE TO CHECK BLOOD SUGAR UP TO 4 TIMES DAILY AS DIRECTED 07/30/22   Breeback, [name] L, PA-C  glucose blood (ACCU-CHEK GUIDE) test strip Use 1 up to 4 times daily to check blood sugar 07/08/22   Breeback, [name] L, PA-C  Insulin Syringe-Needle U-100 (INSULIN SYRINGE .3CC/31GX5/16") 31G X 5/16" 0.3 ML MISC Dx DM E11.9. Inject insulin daily at bedtime. 06/30/21   Breeback, [name] L, PA-C  nicotine (NICODERM CQ - DOSED IN MG/24 HOURS) 14 mg/24hr patch 14 mg daily. Patient not taking: Reported on 09/16/2022 08/24/22   [provider]     Positive ROS: All other systems have been reviewed and were otherwise negative with the exception of those mentioned in the HPI and as above.  Physical Exam: General: Alert, no acute distress Cardiovascular: No pedal edema Respiratory: No cyanosis, no use of accessory musculature GI: abdomen soft Skin: No lesions in the area of chief complaint Neurologic: Sensation intact distally Psychiatric: Patient is competent for consent with normal mood and affect Lymphatic: no lymphedema  MUSCULOSKELETAL: exam stable  Assessment: LEFT ELBOW ABSCESS  Plan: Plan for Procedure(s): LEFT IRRIGATION AND DEBRIDEMENT ELBOW LEFT ELBOW HARDWARE REMOVAL  The risks benefits and alternatives were discussed with the patient including but not limited to the risks of nonoperative treatment,  versus surgical intervention including infection, bleeding, nerve injury,  blood clots, cardiopulmonary complications, morbidity, mortality, among others, and they were willing to proceed.   Eduard Roux, MD 09/17/2022 11:29 AM

## 2022-09-17 NOTE — Op Note (Addendum)
   Date of Surgery: 09/17/2022  INDICATIONS: Kathleen Le is a 61 y.o.-year-old female with a left elbow abscess and persistent drainage.  The patient did consent to the procedure after discussion of the risks and benefits.  PREOPERATIVE DIAGNOSIS:  Left elbow abscess and persistent drainage through open wound  POSTOPERATIVE DIAGNOSIS: same  OPERATIVE FINDINGS:  Elbow abscess and soft tissue infection Septic arthritis left elbow Retained suture and swivel lock that were easily removed No evidence of osteomyelitis  PROCEDURE:  Irrigation and debridement of left elbow abscess, septic arthritis including excisional debridement of skin, subcutaneous tissue, muscle, fascia, bone 96 cm including arthrotomy of the elbow joint and synovectomy Removal of deep implant.  Swivel lock and previous sutures Adjacent tissue rearrangement left elbow 8 cm  Debridement type: Excisional Debridement  SURGEON: Kathleen Le, M.D.  ASSIST: Kathleen Le, [place]; necessary for the timely completion of procedure and due to complexity of procedure.  ANESTHESIA:  general  IV FLUIDS AND URINE: See anesthesia.  ESTIMATED BLOOD LOSS: 50 mL.  IMPLANTS: None  DRAINS: None  COMPLICATIONS: see description of procedure.  DESCRIPTION OF PROCEDURE: The patient was brought to the operating room.  The patient had been signed prior to the procedure and this was documented. The patient had the anesthesia placed by the anesthesiologist.  A time-out was performed to confirm that this was the correct patient, site, side and location. The patient did receive antibiotics prior to the incision and was re-dosed during the procedure as needed at indicated intervals.  A tourniquet was placed.  The patient had the operative extremity prepped and draped in the standard surgical fashion.    The previous scar was incised and the skin edges were excisionally debrided with a 15 blade.  We immediately encountered an abscess and  infected soft tissue.  Rondure was taken to this.  Tissue and fluid cultures were obtained.  The abscess communicated with the elbow joint.  Arthrotomy of the joint was performed and a circumferential synovectomy was performed.  There was no obvious evidence of osteomyelitis.  The previous triceps repair had failed.  The retained sutures in the swivel lock were removed from the proximal ulna without any difficulty.  The bone was thoroughly debrided using a curette and rondure.  Sharp excisional debridement was performed of the surrounding subcutaneous tissue and muscle and fascia in addition to the skin and bone with combination of knife, curette, rondure.  All devitalized tissue was resected back to healthy viable bleeding tissue.  The surgical site was then thoroughly irrigated with 4 L of normal saline.  Hemostasis was then obtained.  Half a gram of vancomycin powder was placed in the wound and deep in the elbow joint.  Backcut was made for adjacent tissue rearrangement.  Layered closure was performed with 2-0 Monocryl and 3-0 nylon.  Sterile dressings were applied.  The elbow was immobilized at 45 degrees in a long-arm splint.  Patient tolerated the procedure well had no immediate complications.  Kathleen Le was necessary for opening, closing, retracting, limb positioning and overall facilitation and timely completion of the procedure.  POSTOPERATIVE PLAN: Patient will be discharged home and follow-up in the office in 2 weeks.  We will arrange for her to have outpatient follow-up with infectious disease as well.  Kathleen Cecil, MD 2:08 PM

## 2022-09-17 NOTE — Transfer of Care (Signed)
Immediate Anesthesia Transfer of Care Note  Patient: Kathleen Le  Procedure(s) Performed: Procedure(s): LEFT IRRIGATION AND DEBRIDEMENT ELBOW (Left) LEFT ELBOW HARDWARE REMOVAL (Left)  Patient Location: PACU  Anesthesia Type:General  Level of Consciousness: Alert, Awake, Oriented  Airway & Oxygen Therapy: Patient Spontanous Breathing  Post-op Assessment: Report given to RN  Post vital signs: Reviewed and stable  Last Vitals:  Vitals:   09/17/22 1110  BP: (!) 141/49  Pulse: 62  Resp: 15  Temp: 36.6 C  SpO2: 91%    Complications: No apparent anesthesia complications

## 2022-09-17 NOTE — Anesthesia Procedure Notes (Signed)
Procedure Name: LMA Insertion Date/Time: 09/17/2022 1:08 PM  Performed by: Gerald Leitz, CRNAPre-anesthesia Checklist: Patient identified, Patient being monitored, Timeout performed, Emergency Drugs available and Suction available Patient Re-evaluated:Patient Re-evaluated prior to induction Oxygen Delivery Method: Circle system utilized Preoxygenation: Pre-oxygenation with 100% oxygen Induction Type: IV induction Ventilation: Mask ventilation without difficulty LMA: LMA inserted and LMA with gastric port inserted LMA Size: 4.0 Tube type: Oral Number of attempts: 1 Placement Confirmation: positive ETCO2 and breath sounds checked- equal and bilateral Tube secured with: Tape Dental Injury: Teeth and Oropharynx as per pre-operative assessment

## 2022-09-18 ENCOUNTER — Other Ambulatory Visit: Payer: Self-pay

## 2022-09-18 ENCOUNTER — Encounter (HOSPITAL_COMMUNITY): Payer: Self-pay | Admitting: Orthopaedic Surgery

## 2022-09-18 LAB — SURGICAL PATHOLOGY

## 2022-09-19 ENCOUNTER — Other Ambulatory Visit: Payer: Self-pay | Admitting: Physician Assistant

## 2022-09-22 ENCOUNTER — Other Ambulatory Visit: Payer: Self-pay

## 2022-09-22 ENCOUNTER — Encounter: Payer: Self-pay | Admitting: Internal Medicine

## 2022-09-22 ENCOUNTER — Ambulatory Visit: Payer: 59 | Admitting: Internal Medicine

## 2022-09-22 VITALS — BP 143/71 | HR 65 | Temp 97.2°F | Ht 66.0 in | Wt 304.0 lb

## 2022-09-22 DIAGNOSIS — L02414 Cutaneous abscess of left upper limb: Secondary | ICD-10-CM | POA: Diagnosis not present

## 2022-09-22 MED ORDER — CEFADROXIL 500 MG PO CAPS: 1000.0000 mg | ORAL_CAPSULE | Freq: Two times a day (BID) | ORAL | 0 refills | Status: DC

## 2022-09-22 NOTE — Progress Notes (Unsigned)
RFV: follow up for   Patient ID: Kathleen Le, female   DOB: 08-Jan-1961, 61 y.o.   MRN: [id number]  HPI 61 yo. F with hx of left triceps rupture, underwent repair of left triceps tendon without graft but had swelling and draining from incision roughly 2 weeks post op. Back in early February 2023. She has had intermittent drainage where she would get a course of oral abtx which somewhat helped but now has had 3 subsequent I x D to left arm/elbow. Last debridement on 9/20- for elbow abscess, septic arthritis, retained suture and swivel lock that were easily removed. She was discharged on doxycycline. She states that in the past swelling and drainage would start roughly 2-3 wks after surgery  Arm remains wrapped. No fever, chills.  Outpatient Encounter Medications as of 09/22/2022  Medication Sig   albuterol (VENTOLIN HFA) 108 (90 Base) MCG/ACT inhaler INHALE 1 TO 2 PUFFS INTO LUNGS EVERY 6 HOURS AS NEEDED FOR WHEEZING OR SHORTNESS OF BREATH (Patient taking differently: Inhale 2 puffs into the lungs daily as needed for shortness of breath or wheezing.)   AMBULATORY NON FORMULARY MEDICATION Blood sugar testing strips and lancets for TrueTrack glucometer.  Use to check blood sugar up to two times a day.  Dx: Type 2 Diabetes   AMBULATORY NON FORMULARY MEDICATION Please provide needles for Tresiba pens   amLODipine (NORVASC) 5 MG tablet Take 1 tablet (5 mg total) by mouth daily.   aspirin 81 MG EC tablet Take 81 mg by mouth daily.   atenolol (TENORMIN) 50 MG tablet Take 1 tablet (50 mg total) by mouth daily.   Blood Glucose Monitoring Suppl (ACCU-CHEK GUIDE ME) w/Device KIT USE TO CHECK BLOOD SUGAR UP TO 4 TIMES DAILY AS DIRECTED   Calcium Carbonate (CALCARB 600 PO) Take 600 mg by mouth daily.   doxepin (SINEQUAN) 25 MG capsule TAKE 1 TO 2 CAPSULES BY MOUTH AT BEDTIME AS NEEDED FOR SLEEP (Patient taking differently: Take 25-50 mg by mouth at bedtime as needed (sleep/anxiety).)   doxycycline  (VIBRA-TABS) 100 MG tablet Take 1 tablet (100 mg total) by mouth 2 (two) times daily.   DULoxetine (CYMBALTA) 30 MG capsule Take 1 capsule by mouth twice daily (Patient taking differently: Take 30 mg by mouth 2 (two) times daily.)   esomeprazole (NEXIUM) 20 MG capsule Take 20 mg by mouth daily.   Flaxseed, Linseed, (FLAX SEED OIL) 1000 MG CAPS Take 1,000 mg by mouth daily.   Fluticasone-Umeclidin-Vilant (TRELEGY ELLIPTA) 100-62.5-25 MCG/ACT AEPB Inhale 1 puff into the lungs daily.   furosemide (LASIX) 40 MG tablet Take 1 tablet (40 mg total) by mouth daily as needed. (Patient taking differently: Take 40 mg by mouth daily as needed for fluid or edema.)   glipiZIDE (GLUCOTROL) 10 MG tablet Take 1 tablet (10 mg total) by mouth 2 (two) times daily before a meal.   glucose blood (ACCU-CHEK GUIDE) test strip Use 1 up to 4 times daily to check blood sugar   hydrochlorothiazide (HYDRODIURIL) 25 MG tablet Take 1 tablet (25 mg total) by mouth daily.   insulin NPH Human (NOVOLIN N RELION) 100 UNIT/ML injection Inject 0.1-0.15 mLs (10-15 Units total) into the skin See admin instructions. Inject 10 units in the morning and 15 units at bedtime.   Insulin Syringe-Needle U-100 (INSULIN SYRINGE .3CC/31GX5/16") 31G X 5/16" 0.3 ML MISC Dx DM E11.9. Inject insulin daily at bedtime.   ipratropium-albuterol (DUONEB) 0.5-2.5 (3) MG/3ML SOLN Take 3 mLs by nebulization every 6 (six) hours as  needed. (Patient taking differently: Take 3 mLs by nebulization 2 (two) times daily as needed (wheezing/shortness of breath).)   lisinopril (ZESTRIL) 5 MG tablet Take 1 tablet by mouth once daily (Patient taking differently: Take 5 mg by mouth daily.)   magnesium gluconate (MAGONATE) 500 MG tablet Take 500 mg by mouth daily.   meloxicam (MOBIC) 15 MG tablet Take 1 tablet (15 mg total) by mouth daily.   metFORMIN (GLUCOPHAGE) 1000 MG tablet Take 1 tablet (1,000 mg total) by mouth 2 (two) times daily with a meal.   Multiple  Vitamins-Minerals (MULTIVITAMIN WITH MINERALS) tablet Take 1 tablet by mouth daily.   nicotine (NICODERM CQ - DOSED IN MG/24 HOURS) 14 mg/24hr patch 14 mg daily.   nystatin (MYCOSTATIN) 100000 UNIT/ML suspension Take 5 mLs (500,000 Units total) by mouth 4 (four) times daily. (Patient taking differently: Take 5 mLs by mouth daily as needed (thrush).)   nystatin (MYCOSTATIN/NYSTOP) powder Apply 1 application. topically 3 (three) times daily. (Patient taking differently: Apply 1 application  topically daily.)   Omega-3 Fatty Acids (FISH OIL) 1000 MG CAPS Take 1,000 mg by mouth 2 (two) times daily.   OVER THE COUNTER MEDICATION Apply 1 application  topically at bedtime. CBD Cream. Apply to feet   pioglitazone (ACTOS) 45 MG tablet Take 1 tablet (45 mg total) by mouth daily.   Probiotic Product (PROBIOTIC DAILY PO) Take 1 capsule by mouth daily.   rosuvastatin (CRESTOR) 20 MG tablet Take 1 tablet (20 mg total) by mouth daily.   sitaGLIPtin (JANUVIA) 100 MG tablet Take 1 tablet (100 mg total) by mouth daily.   [DISCONTINUED] doxycycline (VIBRA-TABS) 100 MG tablet Take 1 tablet (100 mg total) by mouth 2 (two) times daily. (Patient not taking: Reported on 09/22/2022)   No facility-administered encounter medications on file as of 09/22/2022.     Patient Active Problem List   Diagnosis Date Noted   COPD exacerbation ([place]) 09/02/2022   Bursitis of elbow 05/28/2022   Elbow wound, left, initial encounter 05/11/2022   Centrilobular emphysema ([place]) 05/09/2022   Triceps tendon rupture, left, initial encounter 02/07/2022   Fall 12/31/2021   Contusion of left arm 12/31/2021   Acute left-sided low back pain without sciatica 12/31/2021   Neck pain 12/31/2021   Encounter for smoking cessation counseling 11/15/2021   Chronic obstructive pulmonary disease ([place]) 02/26/2021   Hyperlipidemia associated with type 2 diabetes mellitus ([place]) 11/05/2020   Non-restorative sleep 11/05/2020   Snoring 11/05/2020    Anterolisthesis 07/30/2020   Acute stress reaction 09/12/2018   Current smoker 06/08/2018   Diabetes mellitus, type II, insulin dependent ([place]) 06/08/2018   Claustrophobia 06/08/2018   Primary insomnia 06/08/2018   Hot flashes 06/08/2018   Abnormal weight gain 06/08/2018   Lower leg edema 11/10/2017   Insomnia due to other mental disorder 10/27/2017   History of substance abuse ([place]) 10/27/2017   GAD (generalized anxiety disorder) 10/27/2017   Class 3 severe obesity due to excess calories with serious comorbidity and body mass index (BMI) of 40.0 to 44.9 in adult ([place]) 08/31/2017   Cerumen impaction 01/24/2015   Hyperlipidemia 01/24/2015   Gastroesophageal reflux disease with esophagitis 01/10/2015   Foot callus 10/03/2014   PTSD (post-traumatic stress disorder) 09/26/2014   Depression 05/19/2014   Anxiety 05/19/2014   Chronic bilateral low back pain with right-sided sciatica 05/19/2014   DDD (degenerative disc disease), lumbar 05/19/2014   Neuropathy 02/05/2014   IT band syndrome 02/05/2014   Sinus tachycardia 02/05/2014   Memory changes 02/05/2014  Word finding difficulty 02/05/2014   Cervical cancer ([place]) 02/05/2014   Type II diabetes mellitus, uncontrolled 02/03/2014   Unspecified asthma(493.90) 02/03/2014   Essential hypertension, benign 02/03/2014   Pulmonary emphysema ([place]) 07/26/2012     Health Maintenance Due  Topic Date Due   HIV Screening  Never done   PAP SMEAR-Modifier  01/24/2018   OPHTHALMOLOGY EXAM  01/13/2022   FOOT EXAM  08/16/2022     Review of Systems +weight gain otherwise 12 point ros is negative Sochx: off of smoking x 1 month. [place] Physical Exam   BP (!) 143/71   Pulse 65   Temp (!) 97.2 F (36.2 C) (Temporal)   Ht '5\' 6"'  (1.676 m)   Wt (!) 304 lb (137.9 kg)   BMI 49.07 kg/m   Gen= a x o by 3 in NAD Heent = moist mucous membranes. No thrush Ext = left arm is wrapped. Moves all fingers without difficulty Skin = no rash, but left  arm wrapped unable to see elbow  Lab Results  Component Value Date   LABRPR NON REAC 02/28/2014    CBC Lab Results  Component Value Date   WBC 5.9 09/17/2022   RBC 3.91 09/17/2022   HGB 11.5 (L) 09/17/2022   HCT 35.7 (L) 09/17/2022   PLT 262 09/17/2022   MCV 91.3 09/17/2022   MCH 29.4 09/17/2022   MCHC 32.2 09/17/2022   RDW 15.5 09/17/2022   LYMPHSABS 1,184 08/21/2022   EOSABS 107 08/21/2022    BMET Lab Results  Component Value Date   NA 139 09/17/2022   K 4.3 09/17/2022   CL 104 09/17/2022   CO2 27 09/17/2022   GLUCOSE 98 09/17/2022   BUN 24 (H) 09/17/2022   CREATININE 0.85 09/17/2022   CALCIUM 9.2 09/17/2022   GFRNONAA >60 09/17/2022   GFRAA 92 10/30/2020      Assessment and Plan SSTI /elbow abscess = suspect that infection may now resolve since suture/swivel lock now removed. For the time being we will plan to continue with Doxycyline 192m bid plus Cefadroxil 10073mbid x 4 wk - plan to see back in 3-4 wk for follow-up - discussed with micro lab to hold specimen longer to see if isolates c.acnes

## 2022-09-24 ENCOUNTER — Other Ambulatory Visit: Payer: Self-pay | Admitting: Physician Assistant

## 2022-09-24 ENCOUNTER — Encounter: Payer: Self-pay | Admitting: Internal Medicine

## 2022-09-24 DIAGNOSIS — J9690 Respiratory failure, unspecified, unspecified whether with hypoxia or hypercapnia: Secondary | ICD-10-CM | POA: Diagnosis not present

## 2022-09-25 ENCOUNTER — Ambulatory Visit (INDEPENDENT_AMBULATORY_CARE_PROVIDER_SITE_OTHER): Payer: 59 | Admitting: Orthopaedic Surgery

## 2022-09-25 ENCOUNTER — Encounter: Payer: Self-pay | Admitting: Physician Assistant

## 2022-09-25 DIAGNOSIS — S51002A Unspecified open wound of left elbow, initial encounter: Secondary | ICD-10-CM | POA: Diagnosis not present

## 2022-09-25 MED ORDER — ACCU-CHEK SOFTCLIX LANCETS MISC: 99 refills | Status: DC

## 2022-09-25 NOTE — Progress Notes (Signed)
Post-Op Visit Note   Patient: Kathleen Le           Date of Birth: 09-Oct-1961           MRN: [id number] Visit Date: 09/25/2022 PCP: Donella Stade, PA-C   Assessment & Plan:  Chief Complaint:  Chief Complaint  Patient presents with   Left Elbow - Routine Post Op   Visit Diagnoses:  1. Elbow wound, left, initial encounter     Plan: Kathleen Le is 1 week status post left elbow wound I&D and removal of implants.  She saw Dr. Graylon Le earlier this week who put her on cefadroxil and doxycycline for 4 weeks.  She has been doing okay overall.  Examination of the left elbow shows an intact incision.  There is some mild clear drainage from the top of the incision.  No gross purulence.  There is no cellulitis.  No pain with elbow range of motion.  Dry dressing was applied which she will change every day.  I gave her the option for incisional VAC but she declined because she is going on vacation on Saturday and does not want to have a wound VAC attached to her.  She says that she is able to manage the dry dressings herself.  We will see her back in a week for suture removal.  Follow-Up Instructions: Return in about 1 week (around 10/02/2022).   Orders:  Orders Placed This Encounter  Procedures   Prealbumin   No orders of the defined types were placed in this encounter.   Imaging: No results found.  PMFS History: Patient Active Problem List   Diagnosis Date Noted   COPD exacerbation ([place]) 09/02/2022   Bursitis of elbow 05/28/2022   Elbow wound, left, initial encounter 05/11/2022   Centrilobular emphysema ([place]) 05/09/2022   Triceps tendon rupture, left, initial encounter 02/07/2022   Fall 12/31/2021   Contusion of left arm 12/31/2021   Acute left-sided low back pain without sciatica 12/31/2021   Neck pain 12/31/2021   Encounter for smoking cessation counseling 11/15/2021   Chronic obstructive pulmonary disease ([place]) 02/26/2021   Hyperlipidemia associated with type 2 diabetes  mellitus ([place]) 11/05/2020   Non-restorative sleep 11/05/2020   Snoring 11/05/2020   Anterolisthesis 07/30/2020   Acute stress reaction 09/12/2018   Current smoker 06/08/2018   Diabetes mellitus, type II, insulin dependent ([place]) 06/08/2018   Claustrophobia 06/08/2018   Primary insomnia 06/08/2018   Hot flashes 06/08/2018   Abnormal weight gain 06/08/2018   Lower leg edema 11/10/2017   Insomnia due to other mental disorder 10/27/2017   History of substance abuse ([place]) 10/27/2017   GAD (generalized anxiety disorder) 10/27/2017   Class 3 severe obesity due to excess calories with serious comorbidity and body mass index (BMI) of 40.0 to 44.9 in adult ([place]) 08/31/2017   Cerumen impaction 01/24/2015   Hyperlipidemia 01/24/2015   Gastroesophageal reflux disease with esophagitis 01/10/2015   Foot callus 10/03/2014   PTSD (post-traumatic stress disorder) 09/26/2014   Depression 05/19/2014   Anxiety 05/19/2014   Chronic bilateral low back pain with right-sided sciatica 05/19/2014   DDD (degenerative disc disease), lumbar 05/19/2014   Neuropathy 02/05/2014   IT band syndrome 02/05/2014   Sinus tachycardia 02/05/2014   Memory changes 02/05/2014   Word finding difficulty 02/05/2014   Cervical cancer ([place]) 02/05/2014   Type II diabetes mellitus, uncontrolled 02/03/2014   Unspecified asthma(493.90) 02/03/2014   Essential hypertension, benign 02/03/2014   Pulmonary emphysema ([place]) 07/26/2012   Past Medical History:  Diagnosis Date   Anxiety    Arthritis    Asthma    Cancer ([place])    uterine   COPD (chronic obstructive pulmonary disease) (H[facility])    Degenerative disc disease, lumbar    Diabetes mellitus without complication ([facility])    Dyspnea    with exertion due to COPD   GERD (gastroesophageal reflux disease)    History of alcohol abuse    20-30 years ago, patient was going through divorce but does not currently drink any alcohol   History of kidney stones    History of marijuana use     "its been a long time since I last smoked because of my COPD"   History of ovarian cancer 1984   Hypertension    Peripheral neuropathy    Pneumonia    Substance abuse ([place])    patient denies, states she has only ever used marijuana   Swelling of both lower extremities     Family History  Problem Relation Age of Onset   Cancer Paternal Grandmother    Diabetes Paternal Grandmother    Diabetes Paternal Grandfather    Heart attack Maternal Aunt    Cancer Maternal Uncle    Cancer Maternal Grandmother    Heart attack Mother    Cancer Mother    Stroke Mother    Diabetes Father    Heart attack Father    Diabetes Paternal Uncle    Diabetes Paternal Uncle     Past Surgical History:  Procedure Laterality Date   CERVICAL CONIZATION W/BX  1985   DILATION AND CURETTAGE OF UTERUS     ECTOPIC PREGNANCY SURGERY     patient stated she had to have tube removed   HARDWARE REMOVAL Left 09/17/2022   Procedure: LEFT ELBOW HARDWARE REMOVAL;  Surgeon: Kathleen Koyanagi, MD;  Location: [facility];  Service: Orthopedics;  Laterality: Left;   I & D EXTREMITY Left 05/12/2022   Procedure: IRRIGATION AND DEBRIDEMENT LEFT ELBOW;  Surgeon: Kathleen Koyanagi, MD;  Location: [facility];  Service: Orthopedics;  Laterality: Left;   I & D EXTREMITY Left 07/18/2022   Procedure: LEFT ELBOW IRRIGATION AND DEBRIDEMENT EXTREMITY;  Surgeon: Kathleen Koyanagi, MD;  Location: [facility];  Service: Orthopedics;  Laterality: Left;   IRRIGATION AND DEBRIDEMENT ELBOW Left 09/17/2022   Procedure: LEFT IRRIGATION AND DEBRIDEMENT ELBOW;  Surgeon: Kathleen Koyanagi, MD;  Location: [facility];  Service: Orthopedics;  Laterality: Left;   TONSILLECTOMY     TRICEPS TENDON REPAIR Left 02/07/2022   Procedure: left triceps repair;  Surgeon: Kathleen Koyanagi, MD;  [facility];  Service: Orthopedics;  Laterality: Left;   Social History   Occupational History   Not on file  Tobacco Use   Smoking status: Former    Packs/day: 1.00    Types:  Cigarettes    Quit date: 08/22/2022    Years since quitting: 0.0    Passive exposure: Never   Smokeless tobacco: Never   Tobacco comments:    1 pack a day   Vaping Use   Vaping Use: Never used  Substance and Sexual Activity   Alcohol use: Not Currently   Drug use: Not Currently    Types: Marijuana    Comment: no longer smokes marijuana due to COPD   Sexual activity: Yes    Birth control/protection: Post-menopausal

## 2022-09-26 LAB — PREALBUMIN: Prealbumin: 24 mg/dL (ref 17–34)

## 2022-10-01 LAB — AEROBIC/ANAEROBIC CULTURE W GRAM STAIN (SURGICAL/DEEP WOUND): Gram Stain: NONE SEEN

## 2022-10-06 ENCOUNTER — Encounter: Payer: Self-pay | Admitting: Internal Medicine

## 2022-10-06 ENCOUNTER — Ambulatory Visit: Payer: 59 | Admitting: Internal Medicine

## 2022-10-06 VITALS — BP 134/64 | HR 71 | Temp 97.9°F | Ht 66.0 in | Wt 295.0 lb

## 2022-10-06 DIAGNOSIS — J439 Emphysema, unspecified: Secondary | ICD-10-CM | POA: Diagnosis not present

## 2022-10-06 DIAGNOSIS — J9611 Chronic respiratory failure with hypoxia: Secondary | ICD-10-CM | POA: Diagnosis not present

## 2022-10-06 DIAGNOSIS — G4733 Obstructive sleep apnea (adult) (pediatric): Secondary | ICD-10-CM | POA: Diagnosis not present

## 2022-10-06 DIAGNOSIS — J4489 Other specified chronic obstructive pulmonary disease: Secondary | ICD-10-CM

## 2022-10-06 NOTE — Patient Instructions (Addendum)
Please schedule follow up scheduled with APP in 2-3 months.  If my schedule is not open yet, we will contact you with a reminder closer to that time. Please call [phone] if you haven't heard from [place] a month before.   Before your next visit I would like you to have: Home sleep study PFTs - 1 hour.  Continue trelegy inhaler 1 puff once a day.  Continue albuterol inhaler as needed.   What are the benefits of quitting smoking? Quitting smoking can lower your chances of getting or dying from heart disease, lung disease, kidney failure, infection, or cancer. It can also lower your chances of getting osteoporosis, a condition that makes your bones weak. Plus, quitting smoking can help your skin look younger and reduce the chances that you will have problems with sex.  Quitting smoking will improve your health no matter how old you are, and no matter how long or how much you have smoked.  What should I do if I want to quit smoking? The letters in the word "START" can help you remember the steps to take: S = Set a quit date. T = Tell family, friends, and the people around you that you plan to quit. A = Anticipate or plan ahead for the tough times you'll face while quitting. R = Remove cigarettes and other tobacco products from your home, car, and work. T = Talk to your doctor about getting help to quit.  How can my doctor or nurse help? Your doctor or nurse can give you advice on the best way to quit. He or she can also put you in touch with counselors or other people you can call for support. Plus, your doctor or nurse can give you medicines to: ?Reduce your craving for cigarettes ?Reduce the unpleasant symptoms that happen when you stop smoking (called "withdrawal symptoms"). You can also get help from a free phone line (1-800-QUIT-NOW) or go online to [facility].  What are the symptoms of withdrawal? The symptoms include: ?Trouble sleeping ?Being irritable, anxious or  restless ?Getting frustrated or angry ?Having trouble thinking clearly  Some people who stop smoking become temporarily depressed. Some people need treatment for depression, such as counseling or antidepressant medicines. Depressed people might: ?No longer enjoy or care about doing the things they used to like to do ?Feel sad, down, hopeless, nervous, or cranky most of the day, almost every day ?Lose or gain weight ?Sleep too much or too little ?Feel tired or like they have no energy ?Feel guilty or like they are worth nothing ?Forget things or feel confused ?Move and speak more slowly than usual ?Act restless or have trouble staying still ?Think about death or suicide  If you think you might be depressed, see your doctor or nurse. Only someone trained in mental health can tell for sure if you are depressed. If you ever feel like you might hurt yourself, go straight to the nearest emergency department. Or you can call for an ambulance (in the [place] and [place], [place] 9-1-1) or call your doctor or nurse right away and tell them it is an emergency. You can also reach the [place] National Suicide Prevention Lifeline at 775-130-3121 or http://walker-sanchez.info/.  How do medicines help you stop smoking? Different medicines work in different ways: ?Nicotine replacement therapy eases withdrawal and reduces your body's craving for nicotine, the main drug found in cigarettes. There are different forms of nicotine replacement, including skin patches, lozenges, gum, nasal sprays, and "puffers" or inhalers.  Many can be bought without a prescription, while others might require one. ?Bupropion is a prescription medicine that reduces your desire to smoke. This medicine is sold under the brand names Zyban and Wellbutrin. It is also available in a generic version, which is cheaper than brand name medicines. ?Varenicline (brand names: Chantix, Champix) is a prescription medicine that reduces withdrawal symptoms  and cigarette cravings. If you think you'd like to take varenicline and you have a history of depression, anxiety, or heart disease, discuss this with your doctor or nurse before taking the medicine. Varenicline can also increase the effects of alcohol in some people. It's a good idea to limit drinking while you're taking it, at least until you know how it affects you.  How does counseling work? Counseling can happen during formal office visits or just over the phone. A counselor can help you: ?Figure out what triggers your smoking and what to do instead ?Overcome cravings ?Figure out what went wrong when you tried to quit before  What works best? Studies show that people have the best luck at quitting if they take medicines to help them quit and work with a Social worker. It might also be helpful to combine nicotine replacement with one of the prescription medicines that help people quit. In some cases, it might even make sense to take bupropion and varenicline together.  What about e-cigarettes? Sometimes people wonder if using electronic cigarettes, or "e-cigarettes," might help them quit smoking. Using e-cigarettes is also called "vaping." Doctors do not recommend e-cigarettes in place of medicines and counseling. That's because e-cigarettes still contain nicotine as well as other substances that might be harmful. It's not clear how they can affect a person's health in the long term.  Will I gain weight if I quit? Yes, you might gain a few pounds. But quitting smoking will have a much more positive effect on your health than weighing a few pounds more. Plus, you can help prevent some weight gain by being more active and eating less. Taking the medicine bupropion might help control weight gain.   What else can I do to improve my chances of quitting? You can: ?Start exercising. ?Stay away from smokers and places that you associate with smoking. If people close to you smoke, ask them to quit with  you. ?Keep gum, hard candy, or something to put in your mouth handy. If you get a craving for a cigarette, try one of these instead. ?Don't give up, even if you start smoking again. It takes most people a few tries before they succeed.  What if I am pregnant and I smoke? If you are pregnant, it's really important for the health of your baby that you quit. Ask your doctor what options you have, and what is safest for your baby

## 2022-10-06 NOTE — Progress Notes (Signed)
Kathleen Le    [id number]    08-13-1961  Primary Care Physician:Breeback, Royetta Car, PA-C  Referring Physician: Donella Stade, PA-C [place] [place],  [place] [address] Reason for Consultation: COPD Date of Consultation: 10/06/2022  Chief complaint:   Chief Complaint  Patient presents with   Consult    Consult for COPD.  Cough, wheeze and SOB chronic.     HPI: Kathleen Le is a 61 y.o. woman who presents for new patient evaluation of COPD.   Notes worsening dyspnea on exertion over the last year. Has had worsening sciatica and has been less active and says she has been spending most of her time sitting and smoking cigarettes.  Put on 100 lbs in the past year. Was evaluated in the ED 2 months ago for PE rule out. Was negative and treated for COPD exacerbation. She is trying to quit smoking since then. This is the first time she was hospitalized for her breathing, but used to have pneumonia about twice a year, but hasn't had anything in a long time. She is on [facility] since discharge from the hospital.   Takes albuterol prn - has needed less since she quit smoking. Already has a nebulizer machine at home. Takes trelegy inhaler 1 puff once a day. This does seem to help.   DME - [employer]  OBSTRUCTIVE SLEEP APNEA SCREENING  1.  Snoring?:  yes 2.  Tired?:  yes 3.  Observed apnea, stop breathing or choking/gasping during sleep?:  no 4.  Pressure. HTN history?  yes 5.  BMI more than 35 kg/m2?  yes 6.  Age more than 60 yrs?  yes 7.  Neck size larger than 17 in for female or 16 in for female?  yes 8.  Gender = Female?  no  Total:  6  For general population  OSA - Low Risk : Yes to 0 - 2 questions OSA - Intermediate Risk : Yes to 3 - 4 questions OSA - High Risk : Yes to 5 - 8 questions  or Yes to 2 or more of 4 STOP questions + female gender or Yes to 2 or more of 4 STOP questions + BMI > 35kg/m2  or Yes to 2 or more of 4 STOP questions + neck circumference  17 inches / 43cm in female or 16 inches / 41cm in female  References: Rinaldo Cloud al. Anesthesiology 2008; 108: 812-821,  Gabriel Cirri et al Br Dara Hoyer 2012; 108: 332-951,  Gabriel Cirri et al J Clin Sleep Med Sept 2014.   Social history:  Occupation: yes, has worked as Engineer, agricultural, medical supplies. Hasn't worked in 7 years due to sciatica. Exposures: lives with husband, he does not smoke. He is a long Associate Professor. He uses dip. Smoking history: 70 pack years, quit cold [place] in the past.    Social History   Occupational History   Not on file  Tobacco Use   Smoking status: Former    Packs/day: 2.00    Years: 35.00    Total pack years: 70.00    Types: Cigarettes    Quit date: 08/22/2022    Years since quitting: 0.1    Passive exposure: Never   Smokeless tobacco: Never   Tobacco comments:    Did smoke cigarettes over the weekend.  Vaping Use   Vaping Use: Never used  Substance and Sexual Activity   Alcohol use: Not Currently  Drug use: Not Currently    Types: Marijuana    Comment: no longer smokes marijuana due to COPD   Sexual activity: Yes    Birth control/protection: Post-menopausal    Relevant family history:  Family History  Problem Relation Age of Onset   Cancer Paternal Grandmother    Diabetes Paternal Grandmother    Diabetes Paternal Grandfather    Heart attack Maternal Aunt    Cancer Maternal Uncle    Cancer Maternal Grandmother    Heart attack Mother    Cancer Mother    Stroke Mother    Diabetes Father    Heart attack Father    Diabetes Paternal Uncle    Diabetes Paternal Uncle     Past Medical History:  Diagnosis Date   Anxiety    Arthritis    Asthma    Cancer ([place])    uterine   COPD (chronic obstructive pulmonary disease) ([place])    Degenerative disc disease, lumbar    Diabetes mellitus without complication (HCC)    Dyspnea    with exertion due to COPD   GERD (gastroesophageal reflux disease)    History of alcohol abuse    20-30 years ago,  patient was going through divorce but does not currently drink any alcohol   History of kidney stones    History of marijuana use    "its been a long time since I last smoked because of my COPD"   History of ovarian cancer 1984   Hypertension    Peripheral neuropathy    Pneumonia    Substance abuse ([place])    patient denies, states she has only ever used marijuana   Swelling of both lower extremities     Past Surgical History:  Procedure Laterality Date   CERVICAL CONIZATION W/BX  1985   DILATION AND CURETTAGE OF UTERUS     ECTOPIC PREGNANCY SURGERY     patient stated she had to have tube removed   HARDWARE REMOVAL Left 09/17/2022   Procedure: LEFT ELBOW HARDWARE REMOVAL;  Surgeon: Leandrew Koyanagi, MD;  [facility];  Service: Orthopedics;  Laterality: Left;   I & D EXTREMITY Left 05/12/2022   Procedure: IRRIGATION AND DEBRIDEMENT LEFT ELBOW;  Surgeon: Leandrew Koyanagi, MD;  Location: [place];  Service: Orthopedics;  Laterality: Left;   I & D EXTREMITY Left 07/18/2022   Procedure: LEFT ELBOW IRRIGATION AND DEBRIDEMENT EXTREMITY;  Surgeon: Leandrew Koyanagi, MD;  Location: [place];  Service: Orthopedics;  Laterality: Left;   IRRIGATION AND DEBRIDEMENT ELBOW Left 09/17/2022   Procedure: LEFT IRRIGATION AND DEBRIDEMENT ELBOW;  Surgeon: Leandrew Koyanagi, MD;  [facility];  Service: Orthopedics;  Laterality: Left;   TONSILLECTOMY     TRICEPS TENDON REPAIR Left 02/07/2022   Procedure: left triceps repair;  Surgeon: Leandrew Koyanagi, MD;  Location: [place];  Service: Orthopedics;  Laterality: Left;     Physical Exam: Blood pressure 134/64, pulse 71, temperature 97.9 F (36.6 C), height '5\' 6"'$  (1.676 m), weight 295 lb (133.8 kg), SpO2 96 %. Gen:      No acute distress, hoarse voice ENT:  no nasal polyps, mucus membranes moist Lungs:    No increased respiratory effort, symmetric chest wall excursion, clear to auscultation bilaterally, no wheezes or crackles CV:         Regular rate  and rhythm; no murmurs, rubs, or gallops.  No pedal edema Abd:      + bowel sounds; soft, non-tender; no  distension MSK: no acute synovitis of DIP or PIP joints, no mechanics hands. Left elbow wrapped in ace-bandage Skin:      Warm and dry; no rashes Neuro: normal speech, no focal facial asymmetry Psych: alert and oriented x3, normal mood and affect   Data Reviewed/Medical Decision Making:  Independent interpretation of tests:  PFTs:  Labs:  Lab Results  Component Value Date   WBC 5.9 09/17/2022   HGB 11.5 (L) 09/17/2022   HCT 35.7 (L) 09/17/2022   MCV 91.3 09/17/2022   PLT 262 09/17/2022   Lab Results  Component Value Date   NA 139 09/17/2022   K 4.3 09/17/2022   CL 104 09/17/2022   CO2 27 09/17/2022     Immunization status:  Immunization History  Administered Date(s) Administered   Pneumococcal Polysaccharide-23 02/03/2014   Tdap 02/03/2014     I reviewed prior external note(s) from hospital stay at [facility]  I reviewed the result(s) of the labs and imaging as noted above.   I have ordered pfts  Assessment:  COPD, stage unknown with progression of symptoms 3 mm pulmonary nodule Tobacco use disorder OSA - high index of suspicion  Plan/Recommendations: Will proceed with PFTs Continue trelegy inhaler 1 puff once a day.  Home sleep study on room air.   Continue trelegy inhaler 1 puff once a day.  Continue albuterol inhaler as needed.   Wants to try cold [place] for smoking cessation.   Smoking Cessation Counseling:  1. The patient is an everyday smoker and symptomatic due to the following condition COPD 2. The patient is currently contemplative in quitting smoking. 3. I advised patient to quit smoking. 4. We identified patient specific barriers to change.  5. I personally spent 3  minutes counseling the patient regarding tobacco use disorder. 6. We discussed management of stress and anxiety to help with smoking cessation, when applicable. 7. We discussed  nicotine replacement therapy, Wellbutrin, Chantix as possible options. 8. I advised setting a quit date. 9. Follow?up arranged with our office to continue ongoing discussions. 10.Resources given to patient including quit hotline.   We discussed disease management and progression at length today.    Return to Care: Return in about 2 months (around 12/06/2022).  Lenice Llamas, MD Pulmonary and [place]  CC: Donella Stade, [place]

## 2022-10-07 ENCOUNTER — Ambulatory Visit (INDEPENDENT_AMBULATORY_CARE_PROVIDER_SITE_OTHER): Payer: 59 | Admitting: Orthopaedic Surgery

## 2022-10-07 ENCOUNTER — Other Ambulatory Visit: Payer: Self-pay

## 2022-10-07 DIAGNOSIS — S51002A Unspecified open wound of left elbow, initial encounter: Secondary | ICD-10-CM

## 2022-10-07 NOTE — Telephone Encounter (Signed)
She did not desaturate to 88% in the office yesterday unfortunately so we can't prescribe a POC.

## 2022-10-07 NOTE — Telephone Encounter (Signed)
Dr. Shearon Stalls, pt is requesting a more portable oxygen system. Pt was see on 10/16/22. Pt is scheduled to see Beth on 01/06/23. Please advise.

## 2022-10-07 NOTE — Progress Notes (Signed)
Post-Op Visit Note   Patient: Kathleen Le           Date of Birth: [birth year]           MRN: [id number] Visit Date: 10/07/2022 PCP: Donella Stade, PA-C   Assessment & Plan:  Chief Complaint:  Chief Complaint  Patient presents with   Left Elbow - Routine Post Op   Visit Diagnoses:  1. Elbow wound, left, initial encounter     Plan: Kathleen Le returns today 3 weeks status post left elbow I&D on 09/17/2022.  She continues to have serous drainage from the incision.  She states that she was recently hospitalized for another COPD exacerbation.  Examination left elbow shows a small 7 to 8 mm area towards the top half of the incision that is draining serous fluid.  There is no surrounding cellulitis or frank pus.  Painless range of motion of the elbow.  The remaining portions of the incision are healed.  The sutures were entirely removed.  We placed an incisional VAC over this area which will hopefully granulate the wound in.  Recheck next week.  Follow-Up Instructions: Return in about 1 week (around 10/14/2022).   Orders:  No orders of the defined types were placed in this encounter.  No orders of the defined types were placed in this encounter.   Imaging: No results found.  PMFS History: Patient Active Problem List   Diagnosis Date Noted   COPD exacerbation ([place]) 09/02/2022   Bursitis of elbow 05/28/2022   Elbow wound, left, initial encounter 05/11/2022   Centrilobular emphysema ([place]) 05/09/2022   Triceps tendon rupture, left, initial encounter 02/07/2022   Fall 12/31/2021   Contusion of left arm 12/31/2021   Acute left-sided low back pain without sciatica 12/31/2021   Neck pain 12/31/2021   Encounter for smoking cessation counseling 11/15/2021   Chronic obstructive pulmonary disease ([place]) 02/26/2021   Hyperlipidemia associated with type 2 diabetes mellitus ([place]) 11/05/2020   Non-restorative sleep 11/05/2020   Snoring 11/05/2020   Anterolisthesis 07/30/2020   Acute  stress reaction 09/12/2018   Current smoker 06/08/2018   Diabetes mellitus, type II, insulin dependent ([place]) 06/08/2018   Claustrophobia 06/08/2018   Primary insomnia 06/08/2018   Hot flashes 06/08/2018   Abnormal weight gain 06/08/2018   Lower leg edema 11/10/2017   Insomnia due to other mental disorder 10/27/2017   History of substance abuse ([place]) 10/27/2017   GAD (generalized anxiety disorder) 10/27/2017   Class 3 severe obesity due to excess calories with serious comorbidity and body mass index (BMI) of 40.0 to 44.9 in adult ([place]) 08/31/2017   Cerumen impaction 01/24/2015   Hyperlipidemia 01/24/2015   Gastroesophageal reflux disease with esophagitis 01/10/2015   Foot callus 10/03/2014   PTSD (post-traumatic stress disorder) 09/26/2014   Depression 05/19/2014   Anxiety 05/19/2014   Chronic bilateral low back pain with right-sided sciatica 05/19/2014   DDD (degenerative disc disease), lumbar 05/19/2014   Neuropathy 02/05/2014   IT band syndrome 02/05/2014   Sinus tachycardia 02/05/2014   Memory changes 02/05/2014   Word finding difficulty 02/05/2014   Cervical cancer ([employer]) 02/05/2014   Type II diabetes mellitus, uncontrolled 02/03/2014   Unspecified asthma(493.90) 02/03/2014   Essential hypertension, benign 02/03/2014   Pulmonary emphysema ([place]) 07/26/2012   Past Medical History:  Diagnosis Date   Anxiety    Arthritis    Asthma    Cancer ([place])    uterine   COPD (chronic obstructive pulmonary disease) ([place])    Degenerative disc  disease, lumbar    Diabetes mellitus without complication ([place])    Dyspnea    with exertion due to COPD   GERD (gastroesophageal reflux disease)    History of alcohol abuse    20-30 years ago, patient was going through divorce but does not currently drink any alcohol   History of kidney stones    History of marijuana use    "its been a long time since I last smoked because of my COPD"   History of ovarian cancer 1984   Hypertension     Peripheral neuropathy    Pneumonia    Substance abuse ([place])    patient denies, states she has only ever used marijuana   Swelling of both lower extremities     Family History  Problem Relation Age of Onset   Cancer Paternal Grandmother    Diabetes Paternal Grandmother    Diabetes Paternal Grandfather    Heart attack Maternal Aunt    Cancer Maternal Uncle    Cancer Maternal Grandmother    Heart attack Mother    Cancer Mother    Stroke Mother    Diabetes Father    Heart attack Father    Diabetes Paternal Uncle    Diabetes Paternal Uncle     Past Surgical History:  Procedure Laterality Date   CERVICAL CONIZATION W/BX  1985   DILATION AND CURETTAGE OF UTERUS     ECTOPIC PREGNANCY SURGERY     patient stated she had to have tube removed   HARDWARE REMOVAL Left 09/17/2022   Procedure: LEFT ELBOW HARDWARE REMOVAL;  Surgeon: Leandrew Koyanagi, MD;  [facility];  Service: Orthopedics;  Laterality: Left;   I & D EXTREMITY Left 05/12/2022   Procedure: IRRIGATION AND DEBRIDEMENT LEFT ELBOW;  Surgeon: Leandrew Koyanagi, MD;  Location: [place];  Service: Orthopedics;  Laterality: Left;   I & D EXTREMITY Left 07/18/2022   Procedure: LEFT ELBOW IRRIGATION AND DEBRIDEMENT EXTREMITY;  Surgeon: Leandrew Koyanagi, MD;  Location: [facility];  Service: Orthopedics;  Laterality: Left;   IRRIGATION AND DEBRIDEMENT ELBOW Left 09/17/2022   Procedure: LEFT IRRIGATION AND DEBRIDEMENT ELBOW;  Surgeon: Leandrew Koyanagi, MD;  [facility];  Service: Orthopedics;  Laterality: Left;   TONSILLECTOMY     TRICEPS TENDON REPAIR Left 02/07/2022   Procedure: left triceps repair;  Surgeon: Leandrew Koyanagi, MD;  Location: [facility] [place];  Service: Orthopedics;  Laterality: Left;   Social History   Occupational History   Not on file  Tobacco Use   Smoking status: Former    Packs/day: 2.00    Years: 35.00    Total pack years: 70.00    Types: Cigarettes    Quit date: 08/22/2022    Years since quitting: 0.1     Passive exposure: Never   Smokeless tobacco: Never   Tobacco comments:    Did smoke cigarettes over the weekend.  Vaping Use   Vaping Use: Never used  Substance and Sexual Activity   Alcohol use: Not Currently   Drug use: Not Currently    Types: Marijuana    Comment: no longer smokes marijuana due to COPD   Sexual activity: Yes    Birth control/protection: Post-menopausal

## 2022-10-13 ENCOUNTER — Other Ambulatory Visit: Payer: Self-pay | Admitting: Physician Assistant

## 2022-10-14 ENCOUNTER — Ambulatory Visit (INDEPENDENT_AMBULATORY_CARE_PROVIDER_SITE_OTHER): Payer: 59 | Admitting: Physician Assistant

## 2022-10-14 ENCOUNTER — Encounter: Payer: Self-pay | Admitting: Orthopaedic Surgery

## 2022-10-14 DIAGNOSIS — S51002A Unspecified open wound of left elbow, initial encounter: Secondary | ICD-10-CM

## 2022-10-14 NOTE — Progress Notes (Signed)
Post-Op Visit Note   Patient: Kathleen Le           Date of Birth: November 15, 1961           MRN: [id number] Visit Date: 10/14/2022 PCP: Donella Stade, PA-C   Assessment & Plan:  Chief Complaint:  Chief Complaint  Patient presents with   Left Elbow - Follow-up    Left elbow I&D 09/17/2022   Visit Diagnoses:  1. Elbow wound, left, initial encounter     Plan: Patient is a pleasant 61 year old female who comes in today approximately 4 weeks status post left elbow I&D, date of surgery 09/17/2022.  Wound VAC was placed at her appointment last week and has been draining serous fluid.  She denies any pain.  She is currently on doxycycline and cefadroxil per ID.  She is scheduled to follow-up with them later this week.  Examination of the left elbow reveals a small open area towards the proximal incision that is not draining.  no evidence of erythema or cellulitis.  At this point, we have reapplied a new wound VAC.  She will continue this for the next week and follow-up with [place] for recheck next week.  Continue with antibiotics per ID.  Call with concerns or questions in the meantime.  Follow-Up Instructions: Return in about 1 week (around 10/21/2022).   Orders:  No orders of the defined types were placed in this encounter.  No orders of the defined types were placed in this encounter.   Imaging: No new imaging  PMFS History: Patient Active Problem List   Diagnosis Date Noted   COPD exacerbation ([place]) 09/02/2022   Bursitis of elbow 05/28/2022   Elbow wound, left, initial encounter 05/11/2022   Centrilobular emphysema ([place]) 05/09/2022   Triceps tendon rupture, left, initial encounter 02/07/2022   Fall 12/31/2021   Contusion of left arm 12/31/2021   Acute left-sided low back pain without sciatica 12/31/2021   Neck pain 12/31/2021   Encounter for smoking cessation counseling 11/15/2021   Chronic obstructive pulmonary disease ([place]) 02/26/2021   Hyperlipidemia associated with type 2  diabetes mellitus ([place]) 11/05/2020   Non-restorative sleep 11/05/2020   Snoring 11/05/2020   Anterolisthesis 07/30/2020   Acute stress reaction 09/12/2018   Current smoker 06/08/2018   Diabetes mellitus, type II, insulin dependent ([place]) 06/08/2018   Claustrophobia 06/08/2018   Primary insomnia 06/08/2018   Hot flashes 06/08/2018   Abnormal weight gain 06/08/2018   Lower leg edema 11/10/2017   Insomnia due to other mental disorder 10/27/2017   History of substance abuse ([place]) 10/27/2017   GAD (generalized anxiety disorder) 10/27/2017   Class 3 severe obesity due to excess calories with serious comorbidity and body mass index (BMI) of 40.0 to 44.9 in adult ([place]) 08/31/2017   Cerumen impaction 01/24/2015   Hyperlipidemia 01/24/2015   Gastroesophageal reflux disease with esophagitis 01/10/2015   Foot callus 10/03/2014   PTSD (post-traumatic stress disorder) 09/26/2014   Depression 05/19/2014   Anxiety 05/19/2014   Chronic bilateral low back pain with right-sided sciatica 05/19/2014   DDD (degenerative disc disease), lumbar 05/19/2014   Neuropathy 02/05/2014   IT band syndrome 02/05/2014   Sinus tachycardia 02/05/2014   Memory changes 02/05/2014   Word finding difficulty 02/05/2014   Cervical cancer ([place]) 02/05/2014   Type II diabetes mellitus, uncontrolled 02/03/2014   Unspecified asthma(493.90) 02/03/2014   Essential hypertension, benign 02/03/2014   Pulmonary emphysema ([place]) 07/26/2012   Past Medical History:  Diagnosis Date   Anxiety  Arthritis    Asthma    Cancer ([place])    uterine   COPD (chronic obstructive pulmonary disease) (H[facility])    Degenerative disc disease, lumbar    Diabetes mellitus without complication (HCC)    Dyspnea    with exertion due to COPD   GERD (gastroesophageal reflux disease)    History of alcohol abuse    20-30 years ago, patient was going through divorce but does not currently drink any alcohol   History of kidney stones    History of  marijuana use    "its been a long time since I last smoked because of my COPD"   History of ovarian cancer 1984   Hypertension    Peripheral neuropathy    Pneumonia    Substance abuse ([place])    patient denies, states she has only ever used marijuana   Swelling of both lower extremities     Family History  Problem Relation Age of Onset   Cancer Paternal Grandmother    Diabetes Paternal Grandmother    Diabetes Paternal Grandfather    Heart attack Maternal Aunt    Cancer Maternal Uncle    Cancer Maternal Grandmother    Heart attack Mother    Cancer Mother    Stroke Mother    Diabetes Father    Heart attack Father    Diabetes Paternal Uncle    Diabetes Paternal Uncle     Past Surgical History:  Procedure Laterality Date   CERVICAL CONIZATION W/BX  1985   DILATION AND CURETTAGE OF UTERUS     ECTOPIC PREGNANCY SURGERY     patient stated she had to have tube removed   HARDWARE REMOVAL Left 09/17/2022   Procedure: LEFT ELBOW HARDWARE REMOVAL;  Surgeon: Leandrew Koyanagi, MD;  [facility];  Service: Orthopedics;  Laterality: Left;   I & D EXTREMITY Left 05/12/2022   Procedure: IRRIGATION AND DEBRIDEMENT LEFT ELBOW;  Surgeon: Leandrew Koyanagi, MD;  Location: [place];  Service: Orthopedics;  Laterality: Left;   I & D EXTREMITY Left 07/18/2022   Procedure: LEFT ELBOW IRRIGATION AND DEBRIDEMENT EXTREMITY;  Surgeon: Leandrew Koyanagi, MD;  Location: [facility];  Service: Orthopedics;  Laterality: Left;   IRRIGATION AND DEBRIDEMENT ELBOW Left 09/17/2022   Procedure: LEFT IRRIGATION AND DEBRIDEMENT ELBOW;  Surgeon: Leandrew Koyanagi, MD;  [facility];  Service: Orthopedics;  Laterality: Left;   TONSILLECTOMY     TRICEPS TENDON REPAIR Left 02/07/2022   Procedure: left triceps repair;  Surgeon: Leandrew Koyanagi, MD;  Location: [place];  Service: Orthopedics;  Laterality: Left;   Social History   Occupational History   Not on file  Tobacco Use   Smoking status: Former    Packs/day:  2.00    Years: 35.00    Total pack years: 70.00    Types: Cigarettes    Quit date: 08/22/2022    Years since quitting: 0.1    Passive exposure: Never   Smokeless tobacco: Never   Tobacco comments:    Did smoke cigarettes over the weekend.  Vaping Use   Vaping Use: Never used  Substance and Sexual Activity   Alcohol use: Not Currently   Drug use: Not Currently    Types: Marijuana    Comment: no longer smokes marijuana due to COPD   Sexual activity: Yes    Birth control/protection: Post-menopausal

## 2022-10-15 ENCOUNTER — Encounter: Payer: Self-pay | Admitting: Internal Medicine

## 2022-10-15 NOTE — Telephone Encounter (Signed)
Per Dr. Baxter Flattery: "can you have her continue with cefadroxil 1000 mg bid plus doxy '100mg'$  bid x 2 more weeks. can you set up phone appt either Friday or next Monday?"

## 2022-10-16 ENCOUNTER — Other Ambulatory Visit: Payer: Self-pay | Admitting: Physician Assistant

## 2022-10-16 DIAGNOSIS — E119 Type 2 diabetes mellitus without complications: Secondary | ICD-10-CM

## 2022-10-16 DIAGNOSIS — E1165 Type 2 diabetes mellitus with hyperglycemia: Secondary | ICD-10-CM

## 2022-10-16 DIAGNOSIS — I10 Essential (primary) hypertension: Secondary | ICD-10-CM

## 2022-10-16 DIAGNOSIS — Z794 Long term (current) use of insulin: Secondary | ICD-10-CM

## 2022-10-20 ENCOUNTER — Other Ambulatory Visit: Payer: Self-pay

## 2022-10-20 MED ORDER — DOXYCYCLINE HYCLATE 100 MG PO TABS: 100.0000 mg | ORAL_TABLET | Freq: Two times a day (BID) | ORAL | 0 refills | Status: DC

## 2022-10-21 ENCOUNTER — Ambulatory Visit (INDEPENDENT_AMBULATORY_CARE_PROVIDER_SITE_OTHER): Payer: 59 | Admitting: Orthopedic Surgery

## 2022-10-21 ENCOUNTER — Other Ambulatory Visit: Payer: Self-pay | Admitting: Physician Assistant

## 2022-10-21 ENCOUNTER — Encounter: Payer: Self-pay | Admitting: Orthopedic Surgery

## 2022-10-21 DIAGNOSIS — E119 Type 2 diabetes mellitus without complications: Secondary | ICD-10-CM

## 2022-10-21 DIAGNOSIS — S51002A Unspecified open wound of left elbow, initial encounter: Secondary | ICD-10-CM

## 2022-10-21 NOTE — Progress Notes (Signed)
Office Visit Note   Patient: Kathleen Le           Date of Birth: [birth year]           MRN: [id number] Visit Date: 10/21/2022              Requested by: Donella Stade, PA-C [address] [place],  [place] [address] PCP: Donella Stade, [place]  Chief Complaint  Patient presents with   Left Elbow - Routine Post Op      HPI: Patient is a 61 year old woman who has had multiple surgeries for her left elbow.  She started with a triceps tendon rupture underwent repair and has had subsequent surgeries for debridement for infection she has been on antibiotics treated with a wound VAC with persistent drainage.  Assessment & Plan: Visit Diagnoses:  1. Elbow wound, left, initial encounter     Plan: We will plan for a two-stage procedure.  Plan for surgery on Friday with debridement and sending tissue samples for cultures.  We will place her on an installation wound VAC and plan to return to the operating room the next Wednesday for application of Kerecis tissue powder and wound closure.  Risk and benefits were discussed including neurovascular injury need for additional surgery.  Follow-Up Instructions: Return in about 2 weeks (around 11/04/2022).   Ortho Exam  Patient is alert, oriented, no adenopathy, well-dressed, normal affect, normal respiratory effort. Examination of the left elbow patient has no cellulitis there is no pain with flexion extension supination pronation of the elbow.  MRI scans have been negative for osteomyelitis.  Patient has clear gelatinous fluid and clear drainage from the wound.  Patient's most recent hemoglobin A1c is 7.3.  Most recent sed rate is 20 and most recent cultures were negative.  Most recent white cell count is 5.9 with a hemoglobin of 11.5.  Imaging: No results found. No images are attached to the encounter.  Labs: Lab Results  Component Value Date   HGBA1C 7.3 (A) 05/06/2022   HGBA1C 8.0 (H) 02/03/2022   HGBA1C 7.0 (A)  11/15/2021   ESRSEDRATE 20 09/17/2022   ESRSEDRATE 7 02/28/2014   REPTSTATUS 10/01/2022 FINAL 09/17/2022   GRAMSTAIN NO WBC SEEN NO ORGANISMS SEEN  09/17/2022   CULT  09/17/2022    CULTURE HELD FOR 14 DAYS No growth aerobically or anaerobically. Performed at [facility], [place] [address] [place], [place] [address]    LABORGA NO GROWTH 01/24/2015     Lab Results  Component Value Date   ALBUMIN 3.8 08/26/2017   ALBUMIN 4.0 06/25/2016   ALBUMIN 4.0 02/28/2014   PREALBUMIN 24 09/25/2022    No results found for: "MG" No results found for: "VD25OH"  Lab Results  Component Value Date   PREALBUMIN 24 09/25/2022      Latest Ref Rng & Units 09/17/2022   11:40 AM 08/21/2022   12:17 PM 05/06/2022   12:00 AM  CBC EXTENDED  WBC 4.0 - 10.5 K/uL 5.9  6.3  4.9   RBC 3.87 - 5.11 MIL/uL 3.91  4.32  4.46   Hemoglobin 12.0 - 15.0 g/dL 11.5  12.7  13.3   HCT 36.0 - 46.0 % 35.7  38.5  40.9   Platelets 150 - 400 K/uL 262  294  266   NEUT# 1,500 - 7,800 cells/uL  4,391  3,116   Lymph# 850 - 3,900 cells/uL  1,184  1,142      There is no  height or weight on file to calculate BMI.  Orders:  No orders of the defined types were placed in this encounter.  No orders of the defined types were placed in this encounter.    Procedures: No procedures performed  Clinical Data: No additional findings.  ROS:  All other systems negative, except as noted in the HPI. Review of Systems  Objective: Vital Signs: There were no vitals taken for this visit.  Specialty Comments:  No specialty comments available.  PMFS History: Patient Active Problem List   Diagnosis Date Noted   COPD exacerbation ([place]) 09/02/2022   Bursitis of elbow 05/28/2022   Elbow wound, left, initial encounter 05/11/2022   Centrilobular emphysema ([place]) 05/09/2022   Triceps tendon rupture, left, initial encounter 02/07/2022   Fall 12/31/2021   Contusion of left arm 12/31/2021   Acute left-sided low back pain  without sciatica 12/31/2021   Neck pain 12/31/2021   Encounter for smoking cessation counseling 11/15/2021   Chronic obstructive pulmonary disease ([place]) 02/26/2021   Hyperlipidemia associated with type 2 diabetes mellitus ([place]) 11/05/2020   Non-restorative sleep 11/05/2020   Snoring 11/05/2020   Anterolisthesis 07/30/2020   Acute stress reaction 09/12/2018   Current smoker 06/08/2018   Diabetes mellitus, type II, insulin dependent ([place]) 06/08/2018   Claustrophobia 06/08/2018   Primary insomnia 06/08/2018   Hot flashes 06/08/2018   Abnormal weight gain 06/08/2018   Lower leg edema 11/10/2017   Insomnia due to other mental disorder 10/27/2017   History of substance abuse ([place]) 10/27/2017   GAD (generalized anxiety disorder) 10/27/2017   Class 3 severe obesity due to excess calories with serious comorbidity and body mass index (BMI) of 40.0 to 44.9 in adult ([place]) 08/31/2017   Cerumen impaction 01/24/2015   Hyperlipidemia 01/24/2015   Gastroesophageal reflux disease with esophagitis 01/10/2015   Foot callus 10/03/2014   PTSD (post-traumatic stress disorder) 09/26/2014   Depression 05/19/2014   Anxiety 05/19/2014   Chronic bilateral low back pain with right-sided sciatica 05/19/2014   DDD (degenerative disc disease), lumbar 05/19/2014   Neuropathy 02/05/2014   IT band syndrome 02/05/2014   Sinus tachycardia 02/05/2014   Memory changes 02/05/2014   Word finding difficulty 02/05/2014   Cervical cancer ([place]) 02/05/2014   Type II diabetes mellitus, uncontrolled 02/03/2014   Unspecified asthma(493.90) 02/03/2014   Essential hypertension, benign 02/03/2014   Pulmonary emphysema ([place]) 07/26/2012   Past Medical History:  Diagnosis Date   Anxiety    Arthritis    Asthma    Cancer ([place])    uterine   COPD (chronic obstructive pulmonary disease) ([place])    Degenerative disc disease, lumbar    Diabetes mellitus without complication ([place])    Dyspnea    with exertion due to COPD   GERD  (gastroesophageal reflux disease)    History of alcohol abuse    20-30 years ago, patient was going through divorce but does not currently drink any alcohol   History of kidney stones    History of marijuana use    "its been a long time since I last smoked because of my COPD"   History of ovarian cancer 1984   Hypertension    Peripheral neuropathy    Pneumonia    Substance abuse ([place])    patient denies, states she has only ever used marijuana   Swelling of both lower extremities     Family History  Problem Relation Age of Onset   Cancer Paternal Grandmother    Diabetes Paternal Grandmother  Diabetes Paternal Grandfather    Heart attack Maternal Aunt    Cancer Maternal Uncle    Cancer Maternal Grandmother    Heart attack Mother    Cancer Mother    Stroke Mother    Diabetes Father    Heart attack Father    Diabetes Paternal Uncle    Diabetes Paternal Uncle     Past Surgical History:  Procedure Laterality Date   CERVICAL CONIZATION W/BX  1985   DILATION AND CURETTAGE OF UTERUS     ECTOPIC PREGNANCY SURGERY     patient stated she had to have tube removed   HARDWARE REMOVAL Left 09/17/2022   Procedure: LEFT ELBOW HARDWARE REMOVAL;  Surgeon: Leandrew Koyanagi, MD;  Location: [facility];  Service: Orthopedics;  Laterality: Left;   I & D EXTREMITY Left 05/12/2022   Procedure: IRRIGATION AND DEBRIDEMENT LEFT ELBOW;  Surgeon: Leandrew Koyanagi, MD;  Location: [facility];  Service: Orthopedics;  Laterality: Left;   I & D EXTREMITY Left 07/18/2022   Procedure: LEFT ELBOW IRRIGATION AND DEBRIDEMENT EXTREMITY;  Surgeon: Leandrew Koyanagi, MD;  Location: [facility];  Service: Orthopedics;  Laterality: Left;   IRRIGATION AND DEBRIDEMENT ELBOW Left 09/17/2022   Procedure: LEFT IRRIGATION AND DEBRIDEMENT ELBOW;  Surgeon: Leandrew Koyanagi, MD;  Location: [facility];  Service: Orthopedics;  Laterality: Left;   TONSILLECTOMY     TRICEPS TENDON REPAIR Left 02/07/2022   Procedure: left triceps repair;   Surgeon: Leandrew Koyanagi, MD;  Location: [facility];  Service: Orthopedics;  Laterality: Left;   Social History   Occupational History   Not on file  Tobacco Use   Smoking status: Former    Packs/day: 2.00    Years: 35.00    Total pack years: 70.00    Types: Cigarettes    Quit date: 08/22/2022    Years since quitting: 0.1    Passive exposure: Never   Smokeless tobacco: Never   Tobacco comments:    Did smoke cigarettes over the weekend.  Vaping Use   Vaping Use: Never used  Substance and Sexual Activity   Alcohol use: Not Currently   Drug use: Not Currently    Types: Marijuana    Comment: no longer smokes marijuana due to COPD   Sexual activity: Yes    Birth control/protection: Post-menopausal

## 2022-10-23 ENCOUNTER — Encounter (HOSPITAL_COMMUNITY): Payer: Self-pay | Admitting: Orthopedic Surgery

## 2022-10-23 ENCOUNTER — Ambulatory Visit (INDEPENDENT_AMBULATORY_CARE_PROVIDER_SITE_OTHER): Payer: 59 | Admitting: Internal Medicine

## 2022-10-23 ENCOUNTER — Other Ambulatory Visit: Payer: Self-pay

## 2022-10-23 DIAGNOSIS — M7022 Olecranon bursitis, left elbow: Secondary | ICD-10-CM | POA: Diagnosis not present

## 2022-10-23 DIAGNOSIS — L02414 Cutaneous abscess of left upper limb: Secondary | ICD-10-CM

## 2022-10-23 NOTE — Progress Notes (Signed)
Virtual Visit via Telephone Note  I connected with Kathleen Le on 10/23/22 at  1:45 PM EDT by telephone and verified that I am speaking with the correct person using two identifiers.  Location: Patient: at home Provider: in clinic   I discussed the limitations, risks, security and privacy concerns of performing an evaluation and management service by telephone and the availability of in person appointments. I also discussed with the patient that there may be a patient responsible charge related to this service. The patient expressed understanding and agreed to proceed. History of Present Illness: 61 yo. F with hx of left triceps rupture, underwent repair of left triceps tendon without graft but had swelling and draining from incision roughly 2 weeks post op. Back in early February 2023. She has had intermittent drainage where she would get a course of oral abtx which somewhat helped but now has had 3 subsequent I x D to left arm/elbow. Last debridement on 9/20- for elbow abscess, septic arthritis, retained suture and swivel lock that were easily removed. She was discharged on doxycycline and cefadroxil for which she has been taking consistently. She had wound vac removed and evaluated by Dr Sharol Given earlier in the week and more purulence had been expressed per her report. She is now scheduled for another I x D tomorrow by Dr Sharol Given  ROS: no fever or chills. 12 point ros is negative other than hpi Meds:  Outpatient Encounter Medications as of 10/23/2022  Medication Sig   Accu-Chek Softclix Lancets lancets USE 1  TO CHECK GLUCOSE UP TO 4 TIMES DAILY   albuterol (VENTOLIN HFA) 108 (90 Base) MCG/ACT inhaler INHALE 1 TO 2 PUFFS INTO LUNGS EVERY 6 HOURS AS NEEDED FOR WHEEZING OR SHORTNESS OF BREATH (Patient taking differently: Inhale 2 puffs into the lungs daily as needed for shortness of breath or wheezing.)   AMBULATORY NON FORMULARY MEDICATION Blood sugar testing strips and lancets for TrueTrack  glucometer.  Use to check blood sugar up to two times a day.  Dx: Type 2 Diabetes   AMBULATORY NON FORMULARY MEDICATION Please provide needles for Tresiba pens   amLODipine (NORVASC) 5 MG tablet Take 1 tablet by mouth once daily   aspirin 81 MG EC tablet Take 81 mg by mouth daily.   atenolol (TENORMIN) 50 MG tablet Take 1 tablet (50 mg total) by mouth daily.   Blood Glucose Monitoring Suppl (ACCU-CHEK GUIDE ME) w/Device KIT USE TO CHECK BLOOD SUGAR UP TO 4 TIMES DAILY AS DIRECTED   Calcium Carbonate (CALCARB 600 PO) Take 600 mg by mouth daily.   cefadroxil (DURICEF) 500 MG capsule Take 2 capsules (1,000 mg total) by mouth 2 (two) times daily.   doxepin (SINEQUAN) 25 MG capsule TAKE 1 TO 2 CAPSULES BY MOUTH AT BEDTIME AS NEEDED FOR SLEEP (Patient taking differently: Take 50 mg by mouth at bedtime as needed (sleep/anxiety).)   doxycycline (VIBRA-TABS) 100 MG tablet Take 1 tablet (100 mg total) by mouth 2 (two) times daily.   DULoxetine (CYMBALTA) 30 MG capsule Take 1 capsule by mouth twice daily (Patient taking differently: Take 30 mg by mouth 2 (two) times daily.)   esomeprazole (NEXIUM) 20 MG capsule Take 20 mg by mouth daily.   Flaxseed, Linseed, (FLAX SEED OIL) 1000 MG CAPS Take 1,000 mg by mouth daily.   Fluticasone-Umeclidin-Vilant (TRELEGY ELLIPTA) 100-62.5-25 MCG/ACT AEPB Inhale 1 puff into the lungs daily.   furosemide (LASIX) 40 MG tablet Take 1 tablet (40 mg total) by mouth daily as  needed. (Patient taking differently: Take 40 mg by mouth daily as needed for fluid or edema.)   glipiZIDE (GLUCOTROL) 10 MG tablet TAKE 1 TABLET BY MOUTH TWICE DAILY BEFORE A MEAL   glucose blood (ACCU-CHEK GUIDE) test strip Use 1 up to 4 times daily to check blood sugar   hydrochlorothiazide (HYDRODIURIL) 25 MG tablet Take 1 tablet (25 mg total) by mouth daily.   Insulin Syringe-Needle U-100 (INSULIN SYRINGE .3CC/31GX5/16") 31G X 5/16" 0.3 ML MISC Dx DM E11.9. Inject insulin daily at bedtime.    ipratropium-albuterol (DUONEB) 0.5-2.5 (3) MG/3ML SOLN Take 3 mLs by nebulization every 6 (six) hours as needed. (Patient taking differently: Take 3 mLs by nebulization 2 (two) times daily as needed (wheezing/shortness of breath).)   lisinopril (ZESTRIL) 5 MG tablet Take 1 tablet by mouth once daily (Patient taking differently: Take 5 mg by mouth daily.)   MAGNESIUM GLUCONATE PO Take 420 mg by mouth daily.   meloxicam (MOBIC) 15 MG tablet Take 1 tablet (15 mg total) by mouth daily.   metFORMIN (GLUCOPHAGE) 1000 MG tablet TAKE 1 TABLET BY MOUTH TWICE DAILY WITH MEALS   Multiple Vitamins-Minerals (MULTIVITAMIN WITH MINERALS) tablet Take 1 tablet by mouth daily. Woman's +   NOVOLIN N RELION 100 UNIT/ML injection INJECT 10 UNITS SUBCUTANEOUSLY IN THE MORNING AND 15 UNITS AT BEDTIME   nystatin (MYCOSTATIN) 100000 UNIT/ML suspension Take 5 mLs (500,000 Units total) by mouth 4 (four) times daily. (Patient taking differently: Take 5 mLs by mouth daily as needed (thrush).)   nystatin (MYCOSTATIN/NYSTOP) powder Apply 1 application. topically 3 (three) times daily. (Patient taking differently: Apply 1 application  topically daily.)   Omega-3 Fatty Acids (FISH OIL) 1000 MG CAPS Take 1,000 mg by mouth 2 (two) times daily.   OVER THE COUNTER MEDICATION Apply 1 application  topically at bedtime as needed (CBD Cream. Apply to feet).   pioglitazone (ACTOS) 45 MG tablet Take 1 tablet by mouth once daily   Probiotic Product (PROBIOTIC DAILY PO) Take 2 tablets by mouth at bedtime. Gummies   rosuvastatin (CRESTOR) 20 MG tablet Take 1 tablet (20 mg total) by mouth daily.   sitaGLIPtin (JANUVIA) 100 MG tablet Take 1 tablet (100 mg total) by mouth daily.   No facility-administered encounter medications on file as of 10/23/2022.     Patient Active Problem List   Diagnosis Date Noted   COPD exacerbation ([place]) 09/02/2022   Bursitis of elbow 05/28/2022   Elbow wound, left, initial encounter 05/11/2022   Centrilobular  emphysema ([place]) 05/09/2022   Triceps tendon rupture, left, initial encounter 02/07/2022   Fall 12/31/2021   Contusion of left arm 12/31/2021   Acute left-sided low back pain without sciatica 12/31/2021   Neck pain 12/31/2021   Encounter for smoking cessation counseling 11/15/2021   Chronic obstructive pulmonary disease ([place]) 02/26/2021   Hyperlipidemia associated with type 2 diabetes mellitus ([place]) 11/05/2020   Non-restorative sleep 11/05/2020   Snoring 11/05/2020   Anterolisthesis 07/30/2020   Acute stress reaction 09/12/2018   Current smoker 06/08/2018   Diabetes mellitus, type II, insulin dependent ([place]) 06/08/2018   Claustrophobia 06/08/2018   Primary insomnia 06/08/2018   Hot flashes 06/08/2018   Abnormal weight gain 06/08/2018   Lower leg edema 11/10/2017   Insomnia due to other mental disorder 10/27/2017   History of substance abuse ([place]) 10/27/2017   GAD (generalized anxiety disorder) 10/27/2017   Class 3 severe obesity due to excess calories with serious comorbidity and body mass index (BMI) of 40.0 to  44.9 in adult [facility]) 08/31/2017   Cerumen impaction 01/24/2015   Hyperlipidemia 01/24/2015   Gastroesophageal reflux disease with esophagitis 01/10/2015   Foot callus 10/03/2014   PTSD (post-traumatic stress disorder) 09/26/2014   Depression 05/19/2014   Anxiety 05/19/2014   Chronic bilateral low back pain with right-sided sciatica 05/19/2014   DDD (degenerative disc disease), lumbar 05/19/2014   Neuropathy 02/05/2014   IT band syndrome 02/05/2014   Sinus tachycardia 02/05/2014   Memory changes 02/05/2014   Word finding difficulty 02/05/2014   Cervical cancer ([place]) 02/05/2014   Type II diabetes mellitus, uncontrolled 02/03/2014   Unspecified asthma(493.90) 02/03/2014   Essential hypertension, benign 02/03/2014   Pulmonary emphysema ([place]) 07/26/2012     Health Maintenance Due  Topic Date Due   HIV Screening  Never done   Lung Cancer Screening  Never done   PAP  SMEAR-Modifier  01/24/2018   OPHTHALMOLOGY EXAM  01/13/2022   FOOT EXAM  08/16/2022     Review of Systems  Still having significant drainage from elbow wound Physical Exam   Did not examine   CBC Lab Results  Component Value Date   WBC 5.9 09/17/2022   RBC 3.91 09/17/2022   HGB 11.5 (L) 09/17/2022   HCT 35.7 (L) 09/17/2022   PLT 262 09/17/2022   MCV 91.3 09/17/2022   MCH 29.4 09/17/2022   MCHC 32.2 09/17/2022   RDW 15.5 09/17/2022   LYMPHSABS 1,184 08/21/2022   EOSABS 107 08/21/2022    BMET Lab Results  Component Value Date   NA 139 09/17/2022   K 4.3 09/17/2022   CL 104 09/17/2022   CO2 27 09/17/2022   GLUCOSE 98 09/17/2022   BUN 24 (H) 09/17/2022   CREATININE 0.85 09/17/2022   CALCIUM 9.2 09/17/2022   GFRNONAA >60 09/17/2022   GFRAA 92 10/30/2020     Assessment and Plan: Will have her stop doxycycline and cefadroxil today so that can possibly increase probability of positive cultures. Will reach out to dr duda to see if can get multiple cultures in hope to isolate  Will see if need to MRI to see if this has evolved to osteomyelitis Will likely need IV abtx x 6 wk since oral regimen has not worked.  Follow Up Instructions:will see patient in the hospital for further consultation on abtx regimen    I discussed the assessment and treatment plan with the patient. The patient was provided an opportunity to ask questions and all were answered. The patient agreed with the plan and demonstrated an understanding of the instructions.   The patient was advised to call back or seek an in-person evaluation if the symptoms worsen or if the condition fails to improve as anticipated.  I provided 10 minutes of non-face-to-face time during this encounter.  Carlyle Basques, MD

## 2022-10-23 NOTE — Anesthesia Preprocedure Evaluation (Addendum)
Anesthesia Evaluation  Patient identified by MRN, date of birth, ID band Patient awake    Reviewed: Allergy & Precautions, H&P , NPO status , Patient's Chart, lab work & pertinent test results  Airway Mallampati: II   Neck ROM: full    Dental   Pulmonary shortness of breath, asthma , COPD, Patient abstained from smoking., former smoker,    breath sounds clear to auscultation       Cardiovascular hypertension,  Rhythm:regular Rate:Normal     Neuro/Psych PSYCHIATRIC DISORDERS Anxiety Depression    GI/Hepatic GERD  ,  Endo/Other  diabetes, Type 2Morbid obesity  Renal/GU      Musculoskeletal  (+) Arthritis ,   Abdominal   Peds  Hematology   Anesthesia Other Findings   Reproductive/Obstetrics                            Anesthesia Physical Anesthesia Plan  ASA: 3  Anesthesia Plan: General   Post-op Pain Management:    Induction: Intravenous  PONV Risk Score and Plan: 3 and Ondansetron, Dexamethasone, Midazolam and Treatment may vary due to age or medical condition  Airway Management Planned: LMA  Additional Equipment:   Intra-op Plan:   Post-operative Plan:   Informed Consent: I have reviewed the patients History and Physical, chart, labs and discussed the procedure including the risks, benefits and alternatives for the proposed anesthesia with the patient or authorized representative who has indicated his/her understanding and acceptance.       Plan Discussed with: CRNA, Anesthesiologist and Surgeon  Anesthesia Plan Comments: (PAT note written 10/23/2022 by Myra Gianotti, PA-C. )       Anesthesia Quick Evaluation

## 2022-10-23 NOTE — Progress Notes (Signed)
Kathleen Le denies chest pain or shortness of breath. Patient denies having any s/s of Covid in her household, also denies any known exposure to Covid.  Kathleen Le's PCP is Glean Salen, PA-C.  Kathleen. Le has type II diabetes. Patient usually checks CBG 2 times a day, numbers have been 112- 165. Kathleen Le does not see an endocrinologist. I instructed Kathleen Le to not take evening dose of Glipizide, take 7 units of Novolin N at bedtime. influenza am if CBG is greater than 70 take 7 units of Novolin N, do not take any pills for DM in am.  I instructed patient to check CBG after awaking and every 2 hours until arrival  to the hospital.  I Instructed patient if CBG is less than 70 to take 4 Glucose Tablets or 1 tube of Glucose Gel or 1/2 cup of a clear juice. Recheck CBG in 15 minutes if CBG is not over 70 call, pre- op desk at [phone] for further instructions. If scheduled to receive Insulin, do not take Insulin

## 2022-10-23 NOTE — Progress Notes (Signed)
Anesthesia Chart Review: Kathleen Le  Case: [id number] Date/Time: 10/24/22 0715   Procedure: LEFT ELBOW DEBRIDEMENT (Left)   Anesthesia type: Choice   Pre-op diagnosis: Abscess Left Elbow   Location: [facility] OR ROOM 07 / [facility]   Surgeons: [name], MD       DISCUSSION:  Patient is a 61 year old female scheduled for the above procedure. left triceps repair by Dr. Erlinda Hong.  She underwent left triceps repair on 02/07/22 and has required I&D x3, last on 09/17/22. She has had an incisional wound VAC and has been on doxycycline and cefadroxil per ID. At her 10/21/22 follow-up with Dr. Sharol Given, he noted persistent drainage from the wound, but no evidence of cellulitis. He discussed, "We will plan for a two-stage procedure.  Plan for surgery on Friday with debridement and sending tissue samples for cultures.  We will place her on an installation wound VAC and plan to return to the operating room the next Wednesday for application of Kerecis tissue powder and wound closure."   Other history includes smoking, HTN, DM2 (with peripheral neuropathy), asthma, COPD, DOE, GERD, polysubstance use (no longer using THC due to COPD; alcohol abuse was during divorce > 20 years ago, no current use), cancer (h/o "cervical" cancer by 02/05/14 note), morbid obesity.   Recent evaluation on 10/06/22 by pulmonologist Dr. Shearon Stalls for COPD with worsening DOE over the past year in setting of being less active due to sciatica and "spending most of her time sitting and smoking cigarettes.  Put on 100 lbs in the past year." She was treated for a COPD exacerbation 08/22/22 and has been trying to cut down on smoking. CTA chest then showed atelectasis versus infiltrate of the inferior lingula, 3 mm LLL nodule, no PE. Results noted by Dr. Shearon Stalls. Patient was placed on O2 2L/ then, but did not qualify for home O2 by 10/06/22 walk test (no desaturations < 88%). Future PFTs and home sleep study recommended. Continue daily Trelegy and as needed albuterol.  Smoking cessation advised. Two month follow-up planned.   A1c 08/23/22 ([facility]) was 7.2%. Labs on arrival as indicated.   Anesthesia team to evaluate on the day of surgery.    VS:  BP Readings from Last 3 Encounters:  10/06/22 134/64  09/22/22 (!) 143/71  09/17/22 (!) 141/66   Pulse Readings from Last 3 Encounters:  10/06/22 71  09/22/22 65  09/17/22 65     PROVIDERS: Donella Stade, PA-C is PCP  Lenice Llamas, MD is pulmonologist Carlyle Basques, MD is ID   LABS: Last lab results in [facility] include: Lab Results  Component Value Date   WBC 5.9 09/17/2022   HGB 11.5 (L) 09/17/2022   HCT 35.7 (L) 09/17/2022   PLT 262 09/17/2022   GLUCOSE 98 09/17/2022   ALT 16 08/21/2022   AST 16 08/21/2022   NA 139 09/17/2022   K 4.3 09/17/2022   CL 104 09/17/2022   CREATININE 0.85 09/17/2022   BUN 24 (H) 09/17/2022   CO2 27 09/17/2022   TSH 1.04 05/06/2022   HGBA1C 7.3 (A) 05/06/2022   MICROALBUR 60m/L 05/06/2022    IMAGES: MRI Left Elbow 09/15/22: IMPRESSION: 1. Postsurgical changes are again seen of triceps tendon repair. Unchanged high-grade partial-thickness, near complete, tear of the distal triceps tendon insertion. 2. There is again a soft tissue wound at the posterior aspect of the olecranon. New multiloculated and septated enhancing fluid collection measuring up to approximately 3.0 x 2.2 x 1.5 cm suspicious for  an abscess. This borders the posterior aspect of the olecranon and extends towards the posterolateral elbow joint capsule at the distal humerus and proximal olecranon. Again no cortical erosion or marrow edema is seen to indicate MRI evidence of acute osteomyelitis. There is a small elbow joint effusion that is similar to prior. 3. Unchanged small partial-thickness tear of the left common extensor tendon origin.  CTA Chest 08/22/22 ([facility]): IMPRESSION:  1.  No pulmonary embolism.  2.  Atelectasis or infiltrate of the inferior lingula with  atelectasis or scar in the right lower lobe.  3.  3 mm nodule of the left lower lobe. See below for recommendations.  - Excerpt from "Guidelines for Management of Incidental Pulmonary Nodules Detected on CT Images: From the Fleischner Society 2017"  Radiology 2017.  - Note: Guidelines do not apply to lung cancer screening, patients with immunosuppression, or patients with known primary cancer.  - Nodule 56m or less:  Low risk- No routine follow up.  High risk- Optional CT at 12 months.   CXR 08/21/22: FINDINGS: The heart size and mediastinal contours are within normal limits. Both lungs are clear. The visualized skeletal structures are unremarkable. IMPRESSION: No active cardiopulmonary disease.    EKG:   EKG 08/22/22 ([facility]): Per narrative in Care Everywhere: Normal sinus rhythm  Low voltage QRS  Cannot rule out Anterior infarct (cited on or before 03-Jan-2014)  Abnormal ECG  When compared with ECG of 22-Aug-2022 18:52,  No significant change was found  SYoung([phone] on 80/35/[id number]:63:84AM certifies that he/she has reviewed the ECG tracing and confirms the  independent interpretation is  correct.   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   COPD (chronic obstructive pulmonary disease) ([place]    Degenerative disc disease, lumbar    Diabetes mellitus without complication (HCC)    Dyspnea    with exertion due to COPD   GERD (gastroesophageal reflux disease)    History of alcohol abuse    20-30 years ago, patient was going through divorce but does not currently drink any alcohol   History of kidney stones    History of marijuana use    "its been a long time since I last smoked because of my COPD"   History of  ovarian cancer 1984   Hypertension    Peripheral neuropathy    Pneumonia    Substance abuse ([place]    patient denies, states she has only ever used marijuana   Swelling of both lower extremities     Past Surgical History:  Procedure Laterality Date   CERVICAL CONIZATION W/BX  1[place]    patient stated she had to have tube removed   HARDWARE REMOVAL Left 09/17/2022   Procedure: LEFT ELBOW HARDWARE REMOVAL;  Surgeon: XLeandrew Koyanagi MD;  Location: [facility];  Service: Orthopedics;  Laterality: Left;   I & D EXTREMITY Left 05/12/2022   Procedure: IRRIGATION AND DEBRIDEMENT LEFT ELBOW;  Surgeon: XLeandrew Koyanagi MD;  [place] [facility]  Service: Orthopedics;  Laterality: Left;   I & D EXTREMITY Left 07/18/2022   Procedure: LEFT ELBOW IRRIGATION  AND DEBRIDEMENT EXTREMITY;  Surgeon: Leandrew Koyanagi, MD;  [place];  Service: Orthopedics;  Laterality: Left;   IRRIGATION AND DEBRIDEMENT ELBOW Left 09/17/2022   Procedure: LEFT IRRIGATION AND DEBRIDEMENT ELBOW;  Surgeon: Leandrew Koyanagi, MD;  Location: [facility];  Service: Orthopedics;  Laterality: Left;   TONSILLECTOMY     TRICEPS TENDON REPAIR Left 02/07/2022   Procedure: left triceps repair;  Surgeon: Leandrew Koyanagi, MD;  Location: [place];  Service: Orthopedics;  Laterality: Left;    MEDICATIONS: No current facility-administered medications for this encounter.    albuterol (VENTOLIN HFA) 108 (90 Base) MCG/ACT inhaler   amLODipine (NORVASC) 5 MG tablet   aspirin 81 MG EC tablet   atenolol (TENORMIN) 50 MG tablet   Calcium Carbonate (CALCARB 600 PO)   cefadroxil (DURICEF) 500 MG capsule   doxepin (SINEQUAN) 25 MG capsule   doxycycline (VIBRA-TABS) 100 MG tablet   DULoxetine (CYMBALTA) 30 MG capsule   esomeprazole (NEXIUM) 20 MG capsule   Flaxseed, Linseed, (FLAX SEED OIL) 1000 MG CAPS   Fluticasone-Umeclidin-Vilant (TRELEGY ELLIPTA) 100-62.5-25 MCG/ACT AEPB    furosemide (LASIX) 40 MG tablet   glipiZIDE (GLUCOTROL) 10 MG tablet   hydrochlorothiazide (HYDRODIURIL) 25 MG tablet   ipratropium-albuterol (DUONEB) 0.5-2.5 (3) MG/3ML SOLN   lisinopril (ZESTRIL) 5 MG tablet   MAGNESIUM GLUCONATE PO   meloxicam (MOBIC) 15 MG tablet   metFORMIN (GLUCOPHAGE) 1000 MG tablet   Multiple Vitamins-Minerals (MULTIVITAMIN WITH MINERALS) tablet   NOVOLIN N RELION 100 UNIT/ML injection   nystatin (MYCOSTATIN) 100000 UNIT/ML suspension   nystatin (MYCOSTATIN/NYSTOP) powder   Omega-3 Fatty Acids (FISH OIL) 1000 MG CAPS   OVER THE COUNTER MEDICATION   pioglitazone (ACTOS) 45 MG tablet   Probiotic Product (PROBIOTIC DAILY PO)   rosuvastatin (CRESTOR) 20 MG tablet   sitaGLIPtin (JANUVIA) 100 MG tablet   Accu-Chek Softclix Lancets lancets   AMBULATORY NON FORMULARY MEDICATION   AMBULATORY NON FORMULARY MEDICATION   Blood Glucose Monitoring Suppl (ACCU-CHEK GUIDE ME) w/Device KIT   glucose blood (ACCU-CHEK GUIDE) test strip   Insulin Syringe-Needle U-100 (INSULIN SYRINGE .3CC/31GX5/16") 31G X 5/16" 0.3 ML MISC    Myra Gianotti, PA-C Surgical Short Stay/Anesthesiology [facility] Phone [phone] [facility] Phone [phone] 10/23/2022 9:38 AM

## 2022-10-24 ENCOUNTER — Encounter (HOSPITAL_COMMUNITY)
Admission: RE | Disposition: A | Discharge status: Home / Self Care | Payer: Self-pay | Source: Ambulatory Visit | Attending: Orthopedic Surgery

## 2022-10-24 ENCOUNTER — Inpatient Hospital Stay (HOSPITAL_COMMUNITY): Payer: 59 | Admitting: Vascular Surgery

## 2022-10-24 ENCOUNTER — Other Ambulatory Visit: Payer: Self-pay

## 2022-10-24 ENCOUNTER — Inpatient Hospital Stay (HOSPITAL_COMMUNITY)
Admission: RE | Admit: 2022-10-24 | Discharge: 2022-10-29 | DRG: 574 | Disposition: A | Discharge status: Home / Self Care | Payer: 59 | Source: Ambulatory Visit | Attending: Orthopedic Surgery | Admitting: Orthopedic Surgery

## 2022-10-24 ENCOUNTER — Encounter (HOSPITAL_COMMUNITY): Payer: Self-pay | Admitting: Orthopedic Surgery

## 2022-10-24 DIAGNOSIS — K219 Gastro-esophageal reflux disease without esophagitis: Secondary | ICD-10-CM | POA: Diagnosis not present

## 2022-10-24 DIAGNOSIS — F17210 Nicotine dependence, cigarettes, uncomplicated: Secondary | ICD-10-CM | POA: Diagnosis present

## 2022-10-24 DIAGNOSIS — Z809 Family history of malignant neoplasm, unspecified: Secondary | ICD-10-CM

## 2022-10-24 DIAGNOSIS — Z8249 Family history of ischemic heart disease and other diseases of the circulatory system: Secondary | ICD-10-CM | POA: Diagnosis not present

## 2022-10-24 DIAGNOSIS — Z791 Long term (current) use of non-steroidal anti-inflammatories (NSAID): Secondary | ICD-10-CM | POA: Diagnosis not present

## 2022-10-24 DIAGNOSIS — S51002A Unspecified open wound of left elbow, initial encounter: Secondary | ICD-10-CM | POA: Diagnosis not present

## 2022-10-24 DIAGNOSIS — Z79899 Other long term (current) drug therapy: Secondary | ICD-10-CM

## 2022-10-24 DIAGNOSIS — J45909 Unspecified asthma, uncomplicated: Secondary | ICD-10-CM

## 2022-10-24 DIAGNOSIS — Z7982 Long term (current) use of aspirin: Secondary | ICD-10-CM | POA: Diagnosis not present

## 2022-10-24 DIAGNOSIS — J449 Chronic obstructive pulmonary disease, unspecified: Secondary | ICD-10-CM | POA: Diagnosis not present

## 2022-10-24 DIAGNOSIS — Z7951 Long term (current) use of inhaled steroids: Secondary | ICD-10-CM

## 2022-10-24 DIAGNOSIS — Z6841 Body Mass Index (BMI) 40.0 and over, adult: Secondary | ICD-10-CM | POA: Diagnosis not present

## 2022-10-24 DIAGNOSIS — Z888 Allergy status to other drugs, medicaments and biological substances status: Secondary | ICD-10-CM

## 2022-10-24 DIAGNOSIS — Z8543 Personal history of malignant neoplasm of ovary: Secondary | ICD-10-CM

## 2022-10-24 DIAGNOSIS — R69 Illness, unspecified: Secondary | ICD-10-CM | POA: Diagnosis not present

## 2022-10-24 DIAGNOSIS — M71129 Other infective bursitis, unspecified elbow: Secondary | ICD-10-CM

## 2022-10-24 DIAGNOSIS — Z91040 Latex allergy status: Secondary | ICD-10-CM

## 2022-10-24 DIAGNOSIS — L02414 Cutaneous abscess of left upper limb: Secondary | ICD-10-CM | POA: Diagnosis not present

## 2022-10-24 DIAGNOSIS — I10 Essential (primary) hypertension: Secondary | ICD-10-CM

## 2022-10-24 DIAGNOSIS — M71122 Other infective bursitis, left elbow: Secondary | ICD-10-CM | POA: Diagnosis present

## 2022-10-24 DIAGNOSIS — E1142 Type 2 diabetes mellitus with diabetic polyneuropathy: Secondary | ICD-10-CM | POA: Diagnosis not present

## 2022-10-24 DIAGNOSIS — Z7984 Long term (current) use of oral hypoglycemic drugs: Secondary | ICD-10-CM | POA: Diagnosis not present

## 2022-10-24 DIAGNOSIS — Z833 Family history of diabetes mellitus: Secondary | ICD-10-CM | POA: Diagnosis not present

## 2022-10-24 DIAGNOSIS — J9690 Respiratory failure, unspecified, unspecified whether with hypoxia or hypercapnia: Secondary | ICD-10-CM | POA: Diagnosis not present

## 2022-10-24 DIAGNOSIS — I96 Gangrene, not elsewhere classified: Secondary | ICD-10-CM | POA: Diagnosis not present

## 2022-10-24 DIAGNOSIS — Z8542 Personal history of malignant neoplasm of other parts of uterus: Secondary | ICD-10-CM | POA: Diagnosis not present

## 2022-10-24 DIAGNOSIS — Z794 Long term (current) use of insulin: Secondary | ICD-10-CM | POA: Diagnosis not present

## 2022-10-24 DIAGNOSIS — M009 Pyogenic arthritis, unspecified: Secondary | ICD-10-CM | POA: Diagnosis not present

## 2022-10-24 DIAGNOSIS — S46312A Strain of muscle, fascia and tendon of triceps, left arm, initial encounter: Secondary | ICD-10-CM

## 2022-10-24 DIAGNOSIS — Z87891 Personal history of nicotine dependence: Secondary | ICD-10-CM | POA: Diagnosis not present

## 2022-10-24 DIAGNOSIS — M879 Osteonecrosis, unspecified: Secondary | ICD-10-CM | POA: Diagnosis not present

## 2022-10-24 HISTORY — PX: I & D EXTREMITY: SHX5045

## 2022-10-24 HISTORY — DX: Other infective bursitis, unspecified elbow: M71.129

## 2022-10-24 LAB — BASIC METABOLIC PANEL
Anion gap: 12 (ref 5–15)
BUN: 25 mg/dL — ABNORMAL HIGH (ref 6–20)
CO2: 27 mmol/L (ref 22–32)
Calcium: 9.2 mg/dL (ref 8.9–10.3)
Chloride: 102 mmol/L (ref 98–111)
Creatinine, Ser: 1 mg/dL (ref 0.44–1.00)
GFR, Estimated: >60 mL/min (ref >60)
Glucose, Bld: 163 mg/dL — ABNORMAL HIGH (ref 70–99)
Potassium: 3.9 mmol/L (ref 3.5–5.1)
Sodium: 141 mmol/L (ref 135–145)

## 2022-10-24 LAB — CBC
HCT: 34.9 % — ABNORMAL LOW (ref 36.0–46.0)
Hemoglobin: 11.3 g/dL — ABNORMAL LOW (ref 12.0–15.0)
MCH: 29.4 pg (ref 26.0–34.0)
MCHC: 32.4 g/dL (ref 30.0–36.0)
MCV: 90.6 fL (ref 80.0–100.0)
Platelets: 326 10*3/uL (ref 150–400)
RBC: 3.85 MIL/uL — ABNORMAL LOW (ref 3.87–5.11)
RDW: 15.8 % — ABNORMAL HIGH (ref 11.5–15.5)
WBC: 5.7 10*3/uL (ref 4.0–10.5)
nRBC: 0 % (ref 0.0–0.2)

## 2022-10-24 LAB — GLUCOSE, CAPILLARY
Glucose-Capillary: 172 mg/dL — ABNORMAL HIGH (ref 70–99)
Glucose-Capillary: 138 mg/dL — ABNORMAL HIGH (ref 70–99)
Glucose-Capillary: 196 mg/dL — ABNORMAL HIGH (ref 70–99)
Glucose-Capillary: 329 mg/dL — ABNORMAL HIGH (ref 70–99)
Glucose-Capillary: 256 mg/dL — ABNORMAL HIGH (ref 70–99)

## 2022-10-24 LAB — C-REACTIVE PROTEIN: CRP: <0.5 mg/dL (ref <1.0)

## 2022-10-24 LAB — SEDIMENTATION RATE: Sed Rate: 32 mm/hr — ABNORMAL HIGH (ref 0–22)

## 2022-10-24 SURGERY — IRRIGATION AND DEBRIDEMENT EXTREMITY
Anesthesia: General | Site: Elbow | Laterality: Left

## 2022-10-24 MED ORDER — GLIPIZIDE 5 MG PO TABS
10.0000 mg | ORAL_TABLET | Freq: Two times a day (BID) | ORAL | Status: DC
  Administered 2022-10-24 – 2022-10-28 (×9): 10 mg via ORAL
  Filled 2022-10-24 (×9): qty 2

## 2022-10-24 MED ORDER — MORPHINE SULFATE (PF) 2 MG/ML IV SOLN
0.5000 mg | INTRAVENOUS | Status: DC | PRN
  Administered 2022-10-29: 1 mg via INTRAVENOUS
  Filled 2022-10-24: qty 1

## 2022-10-24 MED ORDER — HYDROCHLOROTHIAZIDE 25 MG PO TABS
25.0000 mg | ORAL_TABLET | Freq: Every day | ORAL | Status: DC
  Administered 2022-10-24 – 2022-10-28 (×5): 25 mg via ORAL
  Filled 2022-10-24 (×5): qty 1

## 2022-10-24 MED ORDER — CEFAZOLIN SODIUM-DEXTROSE 2-4 GM/100ML-% IV SOLN: 2.0000 g | Freq: Four times a day (QID) | INTRAVENOUS | Status: DC

## 2022-10-24 MED ORDER — METHOCARBAMOL 500 MG PO TABS
500.0000 mg | ORAL_TABLET | Freq: Four times a day (QID) | ORAL | Status: DC | PRN
  Administered 2022-10-24 – 2022-10-27 (×2): 500 mg via ORAL
  Filled 2022-10-24 (×3): qty 1

## 2022-10-24 MED ORDER — ALBUTEROL SULFATE HFA 108 (90 BASE) MCG/ACT IN AERS: 2.0000 | INHALATION_SPRAY | Freq: Every day | RESPIRATORY_TRACT | Status: DC | PRN

## 2022-10-24 MED ORDER — ONDANSETRON HCL 4 MG PO TABS: 4.0000 mg | ORAL_TABLET | Freq: Four times a day (QID) | ORAL | Status: DC | PRN

## 2022-10-24 MED ORDER — DOCUSATE SODIUM 100 MG PO CAPS
100.0000 mg | ORAL_CAPSULE | Freq: Two times a day (BID) | ORAL | Status: DC
  Administered 2022-10-24 – 2022-10-28 (×10): 100 mg via ORAL
  Filled 2022-10-24 (×10): qty 1

## 2022-10-24 MED ORDER — UMECLIDINIUM BROMIDE 62.5 MCG/ACT IN AEPB
1.0000 | INHALATION_SPRAY | Freq: Every day | RESPIRATORY_TRACT | Status: DC
  Administered 2022-10-24 – 2022-10-28 (×5): 1 via RESPIRATORY_TRACT
  Filled 2022-10-24: qty 7

## 2022-10-24 MED ORDER — 0.9 % SODIUM CHLORIDE (POUR BTL) OPTIME
TOPICAL | Status: DC | PRN
  Administered 2022-10-24: 1000 mL

## 2022-10-24 MED ORDER — ORAL CARE MOUTH RINSE: 15.0000 mL | Freq: Once | OROMUCOSAL | Status: AC

## 2022-10-24 MED ORDER — POLYETHYLENE GLYCOL 3350 17 G PO PACK: 17.0000 g | PACK | Freq: Every day | ORAL | Status: DC | PRN

## 2022-10-24 MED ORDER — HYDROCODONE-ACETAMINOPHEN 5-325 MG PO TABS
1.0000 | ORAL_TABLET | ORAL | Status: DC | PRN
  Administered 2022-10-25 (×2): 1 via ORAL
  Administered 2022-10-26: 2 via ORAL
  Filled 2022-10-24 (×2): qty 1
  Filled 2022-10-24: qty 2

## 2022-10-24 MED ORDER — VANCOMYCIN HCL 1750 MG/350ML IV SOLN
1750.0000 mg | INTRAVENOUS | Status: DC
  Administered 2022-10-25 – 2022-10-27 (×3): 1750 mg via INTRAVENOUS
  Filled 2022-10-24 (×3): qty 350

## 2022-10-24 MED ORDER — LIDOCAINE 2% (20 MG/ML) 5 ML SYRINGE
INTRAMUSCULAR | Status: AC
  Filled 2022-10-24: qty 5

## 2022-10-24 MED ORDER — FENTANYL CITRATE (PF) 100 MCG/2ML IJ SOLN
INTRAMUSCULAR | Status: AC
  Filled 2022-10-24: qty 2

## 2022-10-24 MED ORDER — PROPOFOL 10 MG/ML IV BOLUS
INTRAVENOUS | Status: AC
  Filled 2022-10-24: qty 20

## 2022-10-24 MED ORDER — METOCLOPRAMIDE HCL 5 MG PO TABS: 5.0000 mg | ORAL_TABLET | Freq: Three times a day (TID) | ORAL | Status: DC | PRN

## 2022-10-24 MED ORDER — PIOGLITAZONE HCL 30 MG PO TABS
45.0000 mg | ORAL_TABLET | Freq: Every day | ORAL | Status: DC
  Administered 2022-10-24 – 2022-10-28 (×5): 45 mg via ORAL
  Filled 2022-10-24 (×6): qty 1

## 2022-10-24 MED ORDER — OXYCODONE HCL 5 MG/5ML PO SOLN: 5.0000 mg | Freq: Once | ORAL | Status: AC | PRN

## 2022-10-24 MED ORDER — CHLORHEXIDINE GLUCONATE 0.12 % MT SOLN
OROMUCOSAL | Status: AC
  Administered 2022-10-24: 15 mL via OROMUCOSAL
  Filled 2022-10-24: qty 15

## 2022-10-24 MED ORDER — MAGNESIUM CITRATE PO SOLN: 1.0000 | Freq: Once | ORAL | Status: DC | PRN

## 2022-10-24 MED ORDER — ALBUTEROL SULFATE (2.5 MG/3ML) 0.083% IN NEBU: 2.5000 mg | INHALATION_SOLUTION | Freq: Every day | RESPIRATORY_TRACT | Status: DC | PRN

## 2022-10-24 MED ORDER — ASPIRIN 81 MG PO TBEC
81.0000 mg | DELAYED_RELEASE_TABLET | Freq: Every day | ORAL | Status: DC
  Administered 2022-10-24 – 2022-10-28 (×5): 81 mg via ORAL
  Filled 2022-10-24 (×5): qty 1

## 2022-10-24 MED ORDER — ATENOLOL 50 MG PO TABS
50.0000 mg | ORAL_TABLET | Freq: Every day | ORAL | Status: DC
  Filled 2022-10-24: qty 1

## 2022-10-24 MED ORDER — DULOXETINE HCL 30 MG PO CPEP
30.0000 mg | ORAL_CAPSULE | Freq: Two times a day (BID) | ORAL | Status: DC
  Administered 2022-10-24 – 2022-10-28 (×9): 30 mg via ORAL
  Filled 2022-10-24 (×9): qty 1

## 2022-10-24 MED ORDER — FENTANYL CITRATE (PF) 250 MCG/5ML IJ SOLN
INTRAMUSCULAR | Status: AC
  Filled 2022-10-24: qty 5

## 2022-10-24 MED ORDER — ROSUVASTATIN CALCIUM 20 MG PO TABS
20.0000 mg | ORAL_TABLET | Freq: Every day | ORAL | Status: DC
  Administered 2022-10-24 – 2022-10-28 (×5): 20 mg via ORAL
  Filled 2022-10-24 (×5): qty 1

## 2022-10-24 MED ORDER — MIDAZOLAM HCL 2 MG/2ML IJ SOLN
INTRAMUSCULAR | Status: AC
  Filled 2022-10-24: qty 2

## 2022-10-24 MED ORDER — FLUTICASONE FUROATE-VILANTEROL 100-25 MCG/ACT IN AEPB
1.0000 | INHALATION_SPRAY | Freq: Every day | RESPIRATORY_TRACT | Status: DC
  Administered 2022-10-24 – 2022-10-28 (×5): 1 via RESPIRATORY_TRACT
  Filled 2022-10-24: qty 28

## 2022-10-24 MED ORDER — ACETAMINOPHEN 325 MG PO TABS: 325.0000 mg | ORAL_TABLET | Freq: Four times a day (QID) | ORAL | Status: DC | PRN

## 2022-10-24 MED ORDER — DOXEPIN HCL 50 MG PO CAPS
50.0000 mg | ORAL_CAPSULE | Freq: Every evening | ORAL | Status: DC | PRN
  Filled 2022-10-24: qty 1

## 2022-10-24 MED ORDER — METOCLOPRAMIDE HCL 5 MG/ML IJ SOLN: 5.0000 mg | Freq: Three times a day (TID) | INTRAMUSCULAR | Status: DC | PRN

## 2022-10-24 MED ORDER — ATENOLOL 50 MG PO TABS
50.0000 mg | ORAL_TABLET | Freq: Every day | ORAL | Status: DC
  Administered 2022-10-25 – 2022-10-28 (×4): 50 mg via ORAL
  Filled 2022-10-24 (×5): qty 1

## 2022-10-24 MED ORDER — PROPOFOL 10 MG/ML IV BOLUS
INTRAVENOUS | Status: DC | PRN
  Administered 2022-10-24: 180 mg via INTRAVENOUS

## 2022-10-24 MED ORDER — INSULIN ASPART 100 UNIT/ML IJ SOLN
0.0000 [IU] | Freq: Three times a day (TID) | INTRAMUSCULAR | Status: DC
  Administered 2022-10-24: 2 [IU] via SUBCUTANEOUS
  Administered 2022-10-24: 11 [IU] via SUBCUTANEOUS
  Administered 2022-10-25: 2 [IU] via SUBCUTANEOUS
  Administered 2022-10-25 – 2022-10-26 (×2): 3 [IU] via SUBCUTANEOUS
  Administered 2022-10-26: 2 [IU] via SUBCUTANEOUS
  Administered 2022-10-26 – 2022-10-27 (×2): 3 [IU] via SUBCUTANEOUS
  Administered 2022-10-27 – 2022-10-28 (×3): 2 [IU] via SUBCUTANEOUS
  Administered 2022-10-28: 5 [IU] via SUBCUTANEOUS
  Administered 2022-10-29: 3 [IU] via SUBCUTANEOUS
  Administered 2022-10-29: 5 [IU] via SUBCUTANEOUS

## 2022-10-24 MED ORDER — EPHEDRINE SULFATE-NACL 50-0.9 MG/10ML-% IV SOSY
PREFILLED_SYRINGE | INTRAVENOUS | Status: DC | PRN
  Administered 2022-10-24: 10 mg via INTRAVENOUS
  Administered 2022-10-24: 5 mg via INTRAVENOUS

## 2022-10-24 MED ORDER — INSULIN ASPART 100 UNIT/ML IJ SOLN
INTRAMUSCULAR | Status: AC
  Administered 2022-10-24: 2 [IU] via SUBCUTANEOUS
  Filled 2022-10-24: qty 1

## 2022-10-24 MED ORDER — OXYCODONE HCL 5 MG PO TABS
ORAL_TABLET | ORAL | Status: AC
  Filled 2022-10-24: qty 1

## 2022-10-24 MED ORDER — LISINOPRIL 10 MG PO TABS
5.0000 mg | ORAL_TABLET | Freq: Every day | ORAL | Status: DC
  Administered 2022-10-24 – 2022-10-28 (×5): 5 mg via ORAL
  Filled 2022-10-24 (×5): qty 1

## 2022-10-24 MED ORDER — EPHEDRINE 5 MG/ML INJ
INTRAVENOUS | Status: AC
  Filled 2022-10-24: qty 5

## 2022-10-24 MED ORDER — CHLORHEXIDINE GLUCONATE 0.12 % MT SOLN: 15.0000 mL | Freq: Once | OROMUCOSAL | Status: AC

## 2022-10-24 MED ORDER — FUROSEMIDE 40 MG PO TABS: 40.0000 mg | ORAL_TABLET | Freq: Every day | ORAL | Status: DC | PRN

## 2022-10-24 MED ORDER — LACTATED RINGERS IV SOLN: INTRAVENOUS | Status: DC

## 2022-10-24 MED ORDER — ONDANSETRON HCL 4 MG/2ML IJ SOLN
INTRAMUSCULAR | Status: AC
  Filled 2022-10-24: qty 2

## 2022-10-24 MED ORDER — ONDANSETRON HCL 4 MG/2ML IJ SOLN: 4.0000 mg | Freq: Four times a day (QID) | INTRAMUSCULAR | Status: DC | PRN

## 2022-10-24 MED ORDER — PANTOPRAZOLE SODIUM 40 MG PO TBEC
40.0000 mg | DELAYED_RELEASE_TABLET | Freq: Every day | ORAL | Status: DC
  Administered 2022-10-25 – 2022-10-28 (×4): 40 mg via ORAL
  Filled 2022-10-24 (×4): qty 1

## 2022-10-24 MED ORDER — CEFAZOLIN IN SODIUM CHLORIDE 3-0.9 GM/100ML-% IV SOLN
3.0000 g | INTRAVENOUS | Status: AC
  Administered 2022-10-24: 3 g via INTRAVENOUS

## 2022-10-24 MED ORDER — INSULIN ASPART 100 UNIT/ML IJ SOLN: 0.0000 [IU] | INTRAMUSCULAR | Status: DC | PRN

## 2022-10-24 MED ORDER — CEFAZOLIN IN SODIUM CHLORIDE 3-0.9 GM/100ML-% IV SOLN
INTRAVENOUS | Status: AC
  Filled 2022-10-24: qty 100

## 2022-10-24 MED ORDER — LACTATED RINGERS IV SOLN: INTRAVENOUS | Status: DC | PRN

## 2022-10-24 MED ORDER — METFORMIN HCL 500 MG PO TABS
1000.0000 mg | ORAL_TABLET | Freq: Two times a day (BID) | ORAL | Status: DC
  Administered 2022-10-24 – 2022-10-28 (×9): 1000 mg via ORAL
  Filled 2022-10-24 (×9): qty 2

## 2022-10-24 MED ORDER — OXYCODONE HCL 5 MG PO TABS
5.0000 mg | ORAL_TABLET | Freq: Once | ORAL | Status: AC | PRN
  Administered 2022-10-24: 5 mg via ORAL

## 2022-10-24 MED ORDER — IPRATROPIUM-ALBUTEROL 0.5-2.5 (3) MG/3ML IN SOLN: 3.0000 mL | Freq: Four times a day (QID) | RESPIRATORY_TRACT | Status: DC | PRN

## 2022-10-24 MED ORDER — METHOCARBAMOL 1000 MG/10ML IJ SOLN: 500.0000 mg | Freq: Four times a day (QID) | INTRAVENOUS | Status: DC | PRN

## 2022-10-24 MED ORDER — AMLODIPINE BESYLATE 5 MG PO TABS
5.0000 mg | ORAL_TABLET | Freq: Every day | ORAL | Status: DC
  Administered 2022-10-25 – 2022-10-28 (×4): 5 mg via ORAL
  Filled 2022-10-24 (×4): qty 1

## 2022-10-24 MED ORDER — SODIUM CHLORIDE 0.9 % IV SOLN
2.0000 g | Freq: Three times a day (TID) | INTRAVENOUS | Status: DC
  Administered 2022-10-24 – 2022-10-27 (×9): 2 g via INTRAVENOUS
  Filled 2022-10-24 (×9): qty 12.5

## 2022-10-24 MED ORDER — FENTANYL CITRATE (PF) 100 MCG/2ML IJ SOLN
25.0000 ug | INTRAMUSCULAR | Status: DC | PRN
  Administered 2022-10-24 (×2): 50 ug via INTRAVENOUS

## 2022-10-24 MED ORDER — LIDOCAINE 2% (20 MG/ML) 5 ML SYRINGE
INTRAMUSCULAR | Status: DC | PRN
  Administered 2022-10-24: 400 mg via INTRAVENOUS

## 2022-10-24 MED ORDER — LINAGLIPTIN 5 MG PO TABS
5.0000 mg | ORAL_TABLET | Freq: Every day | ORAL | Status: DC
  Administered 2022-10-24 – 2022-10-28 (×5): 5 mg via ORAL
  Filled 2022-10-24 (×6): qty 1

## 2022-10-24 MED ORDER — ONDANSETRON HCL 4 MG/2ML IJ SOLN
4.0000 mg | Freq: Four times a day (QID) | INTRAMUSCULAR | Status: DC | PRN
  Administered 2022-10-26 – 2022-10-27 (×2): 4 mg via INTRAVENOUS
  Filled 2022-10-24 (×2): qty 2

## 2022-10-24 MED ORDER — VANCOMYCIN HCL 2000 MG/400ML IV SOLN
2000.0000 mg | Freq: Once | INTRAVENOUS | Status: AC
  Administered 2022-10-24: 2000 mg via INTRAVENOUS
  Filled 2022-10-24: qty 400

## 2022-10-24 MED ORDER — DEXAMETHASONE SODIUM PHOSPHATE 10 MG/ML IJ SOLN
INTRAMUSCULAR | Status: DC | PRN
  Administered 2022-10-24: 4 mg via INTRAVENOUS

## 2022-10-24 MED ORDER — HYDROCODONE-ACETAMINOPHEN 7.5-325 MG PO TABS
1.0000 | ORAL_TABLET | ORAL | Status: DC | PRN
  Administered 2022-10-24 (×2): 1 via ORAL
  Administered 2022-10-24 – 2022-10-27 (×3): 2 via ORAL
  Filled 2022-10-24 (×3): qty 2
  Filled 2022-10-24 (×2): qty 1

## 2022-10-24 MED ORDER — MIDAZOLAM HCL 5 MG/5ML IJ SOLN
INTRAMUSCULAR | Status: DC | PRN
  Administered 2022-10-24: 2 mg via INTRAVENOUS

## 2022-10-24 MED ORDER — BISACODYL 10 MG RE SUPP: 10.0000 mg | Freq: Every day | RECTAL | Status: DC | PRN

## 2022-10-24 MED ORDER — DEXAMETHASONE SODIUM PHOSPHATE 10 MG/ML IJ SOLN
INTRAMUSCULAR | Status: AC
  Filled 2022-10-24: qty 1

## 2022-10-24 MED ORDER — SODIUM CHLORIDE 0.9 % IV SOLN: INTRAVENOUS | Status: DC

## 2022-10-24 MED ORDER — FENTANYL CITRATE (PF) 250 MCG/5ML IJ SOLN
INTRAMUSCULAR | Status: DC | PRN
  Administered 2022-10-24 (×5): 50 ug via INTRAVENOUS

## 2022-10-24 MED ORDER — ONDANSETRON HCL 4 MG/2ML IJ SOLN
INTRAMUSCULAR | Status: DC | PRN
  Administered 2022-10-24: 4 mg via INTRAVENOUS

## 2022-10-24 SURGICAL SUPPLY — 36 items
BAG COUNTER SPONGE SURGICOUNT (BAG) IMPLANT
BLADE SURG 21 STRL SS (BLADE) ×1 IMPLANT
BNDG COHESIVE 6X5 TAN NS LF (GAUZE/BANDAGES/DRESSINGS) IMPLANT
BNDG COHESIVE 6X5 TAN STRL LF (GAUZE/BANDAGES/DRESSINGS) IMPLANT
BNDG GAUZE DERMACEA FLUFF 4 (GAUZE/BANDAGES/DRESSINGS) ×2 IMPLANT
CANISTER WOUNDNEG PRESSURE 500 (CANNISTER) IMPLANT
COVER SURGICAL LIGHT HANDLE (MISCELLANEOUS) ×2 IMPLANT
DRAPE U-SHAPE 47X51 STRL (DRAPES) ×1 IMPLANT
DRESSING VERAFLO CLEANS CC MED (GAUZE/BANDAGES/DRESSINGS) IMPLANT
DRSG ADAPTIC 3X8 NADH LF (GAUZE/BANDAGES/DRESSINGS) ×1 IMPLANT
DRSG VERAFLO CLEANSE CC MED (GAUZE/BANDAGES/DRESSINGS) ×1
DURAPREP 26ML APPLICATOR (WOUND CARE) ×1 IMPLANT
ELECT REM PT RETURN 9FT ADLT (ELECTROSURGICAL)
ELECTRODE REM PT RTRN 9FT ADLT (ELECTROSURGICAL) IMPLANT
GAUZE SPONGE 4X4 12PLY STRL (GAUZE/BANDAGES/DRESSINGS) ×1 IMPLANT
GLOVE BIOGEL PI IND STRL 9 (GLOVE) ×1 IMPLANT
GLOVE SURG ORTHO 9.0 STRL STRW (GLOVE) ×1 IMPLANT
GOWN STRL REUS W/ TWL XL LVL3 (GOWN DISPOSABLE) ×2 IMPLANT
GOWN STRL REUS W/TWL XL LVL3 (GOWN DISPOSABLE) ×2
GRAFT SKIN WND MICRO 38 (Tissue) IMPLANT
HANDPIECE INTERPULSE COAX TIP (DISPOSABLE)
KIT BASIN OR (CUSTOM PROCEDURE TRAY) ×1 IMPLANT
KIT TURNOVER KIT B (KITS) ×1 IMPLANT
MANIFOLD NEPTUNE II (INSTRUMENTS) ×1 IMPLANT
NS IRRIG 1000ML POUR BTL (IV SOLUTION) ×1 IMPLANT
PACK ORTHO EXTREMITY (CUSTOM PROCEDURE TRAY) ×1 IMPLANT
PAD ARMBOARD 7.5X6 YLW CONV (MISCELLANEOUS) ×2 IMPLANT
PAD NEG PRESSURE SENSATRAC (MISCELLANEOUS) IMPLANT
SET HNDPC FAN SPRY TIP SCT (DISPOSABLE) IMPLANT
STOCKINETTE IMPERVIOUS 9X36 MD (GAUZE/BANDAGES/DRESSINGS) IMPLANT
SUT ETHILON 2 0 PSLX (SUTURE) ×1 IMPLANT
SWAB COLLECTION DEVICE MRSA (MISCELLANEOUS) ×1 IMPLANT
SWAB CULTURE ESWAB REG 1ML (MISCELLANEOUS) IMPLANT
TOWEL GREEN STERILE (TOWEL DISPOSABLE) ×1 IMPLANT
TUBE CONNECTING 12X1/4 (SUCTIONS) ×1 IMPLANT
YANKAUER SUCT BULB TIP NO VENT (SUCTIONS) ×1 IMPLANT

## 2022-10-24 NOTE — Anesthesia Procedure Notes (Signed)
Procedure Name: LMA Insertion Date/Time: 10/24/2022 7:21 AM  Performed by: Colin Benton, CRNAPre-anesthesia Checklist: Patient identified, Emergency Drugs available, Suction available and Patient being monitored Patient Re-evaluated:Patient Re-evaluated prior to induction Oxygen Delivery Method: Circle System Utilized Preoxygenation: Pre-oxygenation with 100% oxygen Induction Type: IV induction Ventilation: Mask ventilation without difficulty LMA: LMA inserted LMA Size: 4.0 Number of attempts: 1 Placement Confirmation: positive ETCO2 Tube secured with: Tape Dental Injury: Teeth and Oropharynx as per pre-operative assessment

## 2022-10-24 NOTE — Transfer of Care (Signed)
Immediate Anesthesia Transfer of Care Note  Patient: Toya Palacios  Procedure(s) Performed: LEFT ELBOW DEBRIDEMENT (Left: Elbow)  Patient Location: PACU  Anesthesia Type:General  Level of Consciousness: drowsy and patient cooperative  Airway & Oxygen Therapy: Patient Spontanous Breathing and Patient connected to face mask oxygen  Post-op Assessment: Report given to RN and Post -op Vital signs reviewed and stable  Post vital signs: Reviewed and stable  Last Vitals:  Vitals Value Taken Time  BP 135/61 10/24/22 0757  Temp    Pulse 67 10/24/22 0759  Resp 12 10/24/22 0759  SpO2 93 % 10/24/22 0759  Vitals shown include unvalidated device data.  Last Pain:  Vitals:   10/24/22 0613  TempSrc:   PainSc: 5          Complications: No notable events documented.

## 2022-10-24 NOTE — Progress Notes (Signed)
Patient ID: Kathleen Le, female   DOB: 1961/07/09, 61 y.o.   MRN: [id number] Patient is immediately postoperative.  The wound VAC is functioning well.  Infectious disease consulted for antibiotic coverage.  We will return to the operating room on Wednesday for additional debridement and wound closure.

## 2022-10-24 NOTE — Progress Notes (Signed)
Pharmacy Antibiotic Note  Kathleen Le is a 61 y.o. female admitted on 10/24/2022 with  septic bursitis of the elbow .  Pharmacy has been consulted for vancomycin and cefepime dosing.  Patient has a history of left triceps rupture s/p surgical repair, complicated by drainage from incision ~2 weeks post-op. Has had multiple debridements, the last of which occurred on 9/20. Followed up with Dr. Sharol Given earlier this week for wound vac removal and was experiencing more purulence.   Underwent I&D this morning with wound vac placement. Wound cultures have been sent. Dr. Sharol Given planning to return to [place] on 11/1 for additional debridement and wound closure. Patient is afebrile and WBC is WNL.  Plan: Give vancomycin 2000 mg IV x1 dose Start vancomycin 1750 mg q24 hours (Scr 1, eAUC 503) Start cefepime 2g IV q8 hours Monitor clinical status, renal function, and culture data Follow-up additional I&D, LOT  Height: '5\' 6"'$  (167.6 cm) Weight: 135.2 kg (298 lb) IBW/kg (Calculated) : 59.3  Temp (24hrs), Avg:97.8 F (36.6 C), Min:97.2 F (36.2 C), Max:98.2 F (36.8 C)  Recent Labs  Lab 10/24/22 0602  WBC 5.7  CREATININE 1.00    Estimated Creatinine Clearance: 85.8 mL/min (by C-G formula based on SCr of 1 mg/dL).    Allergies  Allergen Reactions   Gabapentin Nausea And Vomiting   Invokana [Canagliflozin] Other (See Comments)    Vaginitis    Latex Itching and Rash   Lyrica [Pregabalin] Other (See Comments)    Pain in feet increased and spasm    Antimicrobials this admission: 10/27 cefepime >>  10/27 vancomycin >>   Dose adjustments this admission: N/A  Microbiology results: 10/27 Wound Cx: sent  Thank you for allowing pharmacy to be a part of this patient's care.  Louanne Belton, PharmD, [facility] PGY1 Pharmacy Resident 10/24/2022 11:08 AM

## 2022-10-24 NOTE — Op Note (Signed)
10/24/2022  10:27 AM  PATIENT:  Kathleen Le    PRE-OPERATIVE DIAGNOSIS:  Abscess Left Elbow  POST-OPERATIVE DIAGNOSIS:  Same  PROCEDURE:  LEFT ELBOW DEBRIDEMENT Tissue including skin and soft tissue muscle fascia and bone sent for cultures. Application of Kerecis micro powder 95 cm. Application of cleanse choice wound VAC sponge x1.  SURGEON:  [name], MD  PHYSICIAN ASSISTANT:None ANESTHESIA:   General  PREOPERATIVE INDICATIONS:  Kathleen Le is a  61 y.o. female with a diagnosis of Abscess Left Elbow who failed conservative measures and elected for surgical management.    The risks benefits and alternatives were discussed with the patient preoperatively including but not limited to the risks of infection, bleeding, nerve injury, cardiopulmonary complications, the need for revision surgery, among others, and the patient was willing to proceed.  OPERATIVE IMPLANTS: Kerecis micro powder 95 cm.  '@ENCIMAGES'$ @  OPERATIVE FINDINGS: Patient had gelatinous nonviable tissue at the insertion of the triceps tendon on the bone.  The necrotic tissue and bone was sent for cultures.  OPERATIVE PROCEDURE: Patient was brought the operating room and underwent a general anesthetic.  After adequate levels anesthesia were obtained patient's left upper extremity was prepped using DuraPrep draped into a sterile field a timeout was called.  Elliptical incision was made around the draining ulcer left elbow.  This was carried sharply down to bone.  There is gelatinous necrotic triceps tendon tissue that was excised.  Nonviable tissue was excised down to bone.  A rondure was used to further excise bone.  Debridement was carried throughout the wound until the remaining tissue was healthy and viable.  The wound was irrigated with normal saline.  95 cm of Kerecis micro powder was applied to the wound.  A cleanse choice wound VAC sponge was applied and local tissue rearrangement was used to close the skin  over the wound VAC sponge for an area of 10 x 4 cm.  This had a good suction fit patient was extubated taken the PACU in stable condition.   DISCHARGE PLANNING:  Antibiotic duration: Antibiotics will adjust according to culture sensitivities.  Weightbearing: None applicable  Pain medication: Opioid pathway  Dressing care/ Wound VAC: Continue wound VAC for 1 week  Ambulatory devices: Not applicable  Discharge to: Discharge to home once cultures are finalized.  Follow-up: In the office 1 week post operative.

## 2022-10-24 NOTE — Plan of Care (Signed)

## 2022-10-24 NOTE — Consult Note (Signed)
[facility]  Total days of antibiotics vanco/ceftriaxone         Reason for Consult: left elbow abscess   Referring Physician: duda  Principal Problem:   Septic bursitis of elbow    HPI: Kathleen Le is a 61 y.o. female hx of left septic olecranon bursitis requiring repeated IX D, and cultures NGTD. X 4. She was last on cefadroxil plus doxycycline since her last debridement on 9/20 (cx were NGTD held for 14 days) the patient states there was little in the wound vac until but she started to notices some serosanginous drainage. When she went this past week to see dr duda, there as collagenous like material extruding from wound. She was electively admitted on 10/27 for IX D. Operative note mentioned that there ewas gelatinous necrotic tricep tendon and other nonviable tissue to the bone.   Her last mri imaging was prior to previous I x D -mri on 9/18 - which showed abscess but no signs of osteomyelitls  Patient would like to avoid picc line since she is mostly by herself. -   Past Medical History:  Diagnosis Date   Anxiety    Arthritis    Asthma    Cancer ([place])    uterine   COPD (chronic obstructive pulmonary disease) ([place])    Degenerative disc disease, lumbar    Diabetes mellitus without complication (HCC)    Type II   Dyspnea    with exertion due to COPD   GERD (gastroesophageal reflux disease)    History of alcohol abuse    20-30 years ago, patient was going through divorce but does not currently drink any alcohol   History of kidney stones    passed   History of marijuana use    "its been a long time since I last smoked because of my COPD"   History of ovarian cancer 1984   Hypertension    Peripheral neuropathy    Pneumonia    Substance abuse ([place])    patient denies, states she has only ever used marijuana   Swelling of both lower extremities     Allergies:  Allergies  Allergen Reactions   Gabapentin Nausea And Vomiting   Invokana  [Canagliflozin] Other (See Comments)    Vaginitis    Latex Itching and Rash   Lyrica [Pregabalin] Other (See Comments)    Pain in feet increased and spasm    MEDICATIONS:  [START ON 10/25/2022] amLODipine  5 mg Oral Daily   aspirin EC  81 mg Oral Daily   [START ON 10/25/2022] atenolol  50 mg Oral Daily   docusate sodium  100 mg Oral BID   DULoxetine  30 mg Oral BID   fentaNYL       fentaNYL       fluticasone furoate-vilanterol  1 puff Inhalation Daily   And   umeclidinium bromide  1 puff Inhalation Daily   glipiZIDE  10 mg Oral BID AC   hydrochlorothiazide  25 mg Oral Daily   insulin aspart  0-15 Units Subcutaneous TID WC   linagliptin  5 mg Oral Daily   lisinopril  5 mg Oral Daily   metFORMIN  1,000 mg Oral BID WC   oxyCODONE       [START ON 10/25/2022] pantoprazole  40 mg Oral Daily   pioglitazone  45 mg Oral Daily   rosuvastatin  20 mg Oral Daily    Social History   Tobacco Use   Smoking status:  Former    Packs/day: 0.25    Years: 35.00    Total pack years: 8.75    Types: Cigarettes    Quit date: 08/22/2022    Years since quitting: 0.1    Passive exposure: Never   Smokeless tobacco: Never   Tobacco comments:    Did smoke cigarettes over the weekend.  Vaping Use   Vaping Use: Never used  Substance Use Topics   Alcohol use: Not Currently   Drug use: Not Currently    Types: Marijuana    Comment: 10/23/22- has not used since 2019    Family History  Problem Relation Age of Onset   Cancer Paternal Grandmother    Diabetes Paternal Grandmother    Diabetes Paternal Grandfather    Heart attack Maternal Aunt    Cancer Maternal Uncle    Cancer Maternal Grandmother    Heart attack Mother    Cancer Mother    Stroke Mother    Diabetes Father    Heart attack Father    Diabetes Paternal Uncle    Diabetes Paternal Uncle     Review of Systems -   Constitutional: Negative for fever, chills, diaphoresis, activity change, appetite change, fatigue and unexpected  weight change.  HENT: Negative for congestion, sore throat, rhinorrhea, sneezing, trouble swallowing and sinus pressure.  Eyes: Negative for photophobia and visual disturbance.  Respiratory: Negative for cough, chest tightness, shortness of breath, wheezing and stridor.  Cardiovascular: Negative for chest pain, palpitations and leg swelling.  Gastrointestinal: Negative for nausea, vomiting, abdominal pain, diarrhea, constipation, blood in stool, abdominal distention and anal bleeding.  Genitourinary: Negative for dysuria, hematuria, flank pain and difficulty urinating.  Musculoskeletal: Negative for myalgias, back pain, joint swelling, arthralgias and gait problem.  Skin: +left elbow drainage Neurological: Negative for dizziness, tremors, weakness and light-headedness.  Hematological: Negative for adenopathy. Does not bruise/bleed easily.  Psychiatric/Behavioral: Negative for behavioral problems, confusion, sleep disturbance, dysphoric mood, decreased concentration and agitation.    OBJECTIVE: Temp:  [97.2 F (36.2 C)-98.2 F (36.8 C)] 97.9 F (36.6 C) (10/27 1000) Pulse Rate:  [63-76] 64 (10/27 1000) Resp:  [10-23] 14 (10/27 1000) BP: (97-146)/(56-98) 106/65 (10/27 1000) SpO2:  [91 %-98 %] 93 % (10/27 1000) Weight:  [135.2 kg] 135.2 kg (10/27 0541) Physical Exam  Constitutional:  oriented to person, place, and time. appears well-developed and well-nourished. No distress.  HENT: /AT, PERRLA, no scleral icterus Mouth/Throat: Oropharynx is clear and moist. No oropharyngeal exudate.  Cardiovascular: Normal rate, regular rhythm and normal heart sounds. Exam reveals no gallop and no friction rub.  No murmur heard.  Pulmonary/Chest: Effort normal and breath sounds normal. No respiratory distress.  has no wheezes.  Neck = supple, no nuchal rigidity Abdominal: Soft. Bowel sounds are normal.  exhibits no distension. There is no tenderness.  Ext: +left elbow wrapped with wound vac Skin:  Skin is warm and dry. No rash noted. No erythema.  Psychiatric: a normal mood and affect.  behavior is normal.    LABS: Results for orders placed or performed during the hospital encounter of 10/24/22 (from the past 48 hour(s))  Glucose, capillary     Status: Abnormal   Collection Time: 10/24/22  5:44 AM  Result Value Ref Range   Glucose-Capillary 172 (H) 70 - 99 mg/dL    Comment: Glucose reference range applies only to samples taken after fasting for at least 8 hours.  CBC per protocol     Status: Abnormal   Collection Time: 10/24/22  6:02  AM  Result Value Ref Range   WBC 5.7 4.0 - 10.5 K/uL   RBC 3.85 (L) 3.87 - 5.11 MIL/uL   Hemoglobin 11.3 (L) 12.0 - 15.0 g/dL   HCT 34.9 (L) 36.0 - 46.0 %   MCV 90.6 80.0 - 100.0 fL   MCH 29.4 26.0 - 34.0 pg   MCHC 32.4 30.0 - 36.0 g/dL   RDW 15.8 (H) 11.5 - 15.5 %   Platelets 326 150 - 400 K/uL   nRBC 0.0 0.0 - 0.2 %    Comment: Performed at [facility] [address] [place], [place] [address]  Basic metabolic panel per protocol     Status: Abnormal   Collection Time: 10/24/22  6:02 AM  Result Value Ref Range   Sodium 141 135 - 145 mmol/L   Potassium 3.9 3.5 - 5.1 mmol/L   Chloride 102 98 - 111 mmol/L   CO2 27 22 - 32 mmol/L   Glucose, Bld 163 (H) 70 - 99 mg/dL    Comment: Glucose reference range applies only to samples taken after fasting for at least 8 hours.   BUN 25 (H) 6 - 20 mg/dL   Creatinine, Ser 1.00 0.44 - 1.00 mg/dL   Calcium 9.2 8.9 - 10.3 mg/dL   GFR, Estimated >60 >60 mL/min    Comment: (NOTE) Calculated using the CKD-EPI Creatinine Equation (2021)    Anion gap 12 5 - 15    Comment: Performed at [facility] [address] [place], [place] [address]  Glucose, capillary     Status: Abnormal   Collection Time: 10/24/22  8:43 AM  Result Value Ref Range   Glucose-Capillary 138 (H) 70 - 99 mg/dL    Comment: Glucose reference range applies only to samples taken after fasting for at least 8 hours.     MICRO: pending IMAGING: No results found.  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 Will look at cultures to see if need to change abtx

## 2022-10-24 NOTE — H&P (Signed)
Kathleen Le is an 61 y.o. female.   Chief Complaint: Chronic left elbow infection. HPI: Patient is a 61 year old woman who has had multiple surgeries for her left elbow.  She started with a triceps tendon rupture underwent repair and has had subsequent surgeries for debridement for infection she has been on antibiotics treated with a wound VAC with persistent drainage.   Past Medical History:  Diagnosis Date   Anxiety    Arthritis    Asthma    Cancer ([place])    uterine   COPD (chronic obstructive pulmonary disease) (HCC)    Degenerative disc disease, lumbar    Diabetes mellitus without complication (HCC)    Type II   Dyspnea    with exertion due to COPD   GERD (gastroesophageal reflux disease)    History of alcohol abuse    20-30 years ago, patient was going through divorce but does not currently drink any alcohol   History of kidney stones    passed   History of marijuana use    "its been a long time since I last smoked because of my COPD"   History of ovarian cancer 1984   Hypertension    Peripheral neuropathy    Pneumonia    Substance abuse ([place])    patient denies, states she has only ever used marijuana   Swelling of both lower extremities     Past Surgical History:  Procedure Laterality Date   CERVICAL CONIZATION W/BX  [place]     patient stated she had to have tube removed   HARDWARE REMOVAL Left 09/17/2022   Procedure: LEFT ELBOW HARDWARE REMOVAL;  Surgeon: Leandrew Koyanagi, MD;  Location: [facility];  Service: Orthopedics;  Laterality: Left;   I & D EXTREMITY Left 05/12/2022   Procedure: IRRIGATION AND DEBRIDEMENT LEFT ELBOW;  Surgeon: Leandrew Koyanagi, MD;  Location: [facility];  Service: Orthopedics;  Laterality: Left;   I & D EXTREMITY Left 07/18/2022   Procedure: LEFT ELBOW IRRIGATION AND DEBRIDEMENT EXTREMITY;  Surgeon: Leandrew Koyanagi, MD;  Location: [facility];  Service: Orthopedics;  Laterality:  Left;   IRRIGATION AND DEBRIDEMENT ELBOW Left 09/17/2022   Procedure: LEFT IRRIGATION AND DEBRIDEMENT ELBOW;  Surgeon: Leandrew Koyanagi, MD;  Location: [facility];  Service: Orthopedics;  Laterality: Left;   TONSILLECTOMY     TRICEPS TENDON REPAIR Left 02/07/2022   Procedure: left triceps repair;  Surgeon: Leandrew Koyanagi, MD;  Location: [facility];  Service: Orthopedics;  Laterality: Left;    Family History  Problem Relation Age of Onset   Cancer Paternal Grandmother    Diabetes Paternal Grandmother    Diabetes Paternal Grandfather    Heart attack Maternal Aunt    Cancer Maternal Uncle    Cancer Maternal Grandmother    Heart attack Mother    Cancer Mother    Stroke Mother    Diabetes Father    Heart attack Father    Diabetes Paternal Uncle    Diabetes Paternal Uncle    Social History:  reports that she quit smoking about 2 months ago. Her smoking use included cigarettes. She has a 8.75 pack-year smoking history. She has never been exposed to tobacco smoke. She has never used smokeless tobacco. She reports that she does not currently use alcohol. She reports that she does not currently use drugs after having used the following drugs: Marijuana.  Allergies:  Allergies  Allergen Reactions   Gabapentin Nausea And Vomiting   Invokana [Canagliflozin] Other (See Comments)    Vaginitis    Latex Itching and Rash   Lyrica [Pregabalin] Other (See Comments)    Pain in feet increased and spasm    Medications Prior to Admission  Medication Sig Dispense Refill   albuterol (VENTOLIN HFA) 108 (90 Base) MCG/ACT inhaler INHALE 1 TO 2 PUFFS INTO LUNGS EVERY 6 HOURS AS NEEDED FOR WHEEZING OR SHORTNESS OF BREATH (Patient taking differently: Inhale 2 puffs into the lungs daily as needed for shortness of breath or wheezing.) 18 g 0   amLODipine (NORVASC) 5 MG tablet Take 1 tablet by mouth once daily 90 tablet 0   aspirin 81 MG EC tablet Take 81 mg by mouth daily.     atenolol (TENORMIN) 50 MG tablet Take 1  tablet (50 mg total) by mouth daily. 90 tablet 3   Calcium Carbonate (CALCARB 600 PO) Take 600 mg by mouth daily.     cefadroxil (DURICEF) 500 MG capsule Take 2 capsules (1,000 mg total) by mouth 2 (two) times daily. 120 capsule 0   doxepin (SINEQUAN) 25 MG capsule TAKE 1 TO 2 CAPSULES BY MOUTH AT BEDTIME AS NEEDED FOR SLEEP (Patient taking differently: Take 50 mg by mouth at bedtime as needed (sleep/anxiety).) 180 capsule 0   doxycycline (VIBRA-TABS) 100 MG tablet Take 1 tablet (100 mg total) by mouth 2 (two) times daily. 30 tablet 0   DULoxetine (CYMBALTA) 30 MG capsule Take 1 capsule by mouth twice daily (Patient taking differently: Take 30 mg by mouth 2 (two) times daily.) 180 capsule 1   esomeprazole (NEXIUM) 20 MG capsule Take 20 mg by mouth daily.     Flaxseed, Linseed, (FLAX SEED OIL) 1000 MG CAPS Take 1,000 mg by mouth daily.     Fluticasone-Umeclidin-Vilant (TRELEGY ELLIPTA) 100-62.5-25 MCG/ACT AEPB Inhale 1 puff into the lungs daily. 1 each 11   furosemide (LASIX) 40 MG tablet Take 1 tablet (40 mg total) by mouth daily as needed. (Patient taking differently: Take 40 mg by mouth daily as needed for fluid or edema.) 30 tablet 0   glipiZIDE (GLUCOTROL) 10 MG tablet TAKE 1 TABLET BY MOUTH TWICE DAILY BEFORE A MEAL 180 tablet 0   hydrochlorothiazide (HYDRODIURIL) 25 MG tablet Take 1 tablet (25 mg total) by mouth daily. 90 tablet 0   Insulin Syringe-Needle U-100 (INSULIN SYRINGE .3CC/31GX5/16") 31G X 5/16" 0.3 ML MISC Dx DM E11.9. Inject insulin daily at bedtime. 10 each prn   lisinopril (ZESTRIL) 5 MG tablet Take 1 tablet by mouth once daily (Patient taking differently: Take 5 mg by mouth daily.) 90 tablet 0   MAGNESIUM GLUCONATE PO Take 420 mg by mouth daily.     meloxicam (MOBIC) 15 MG tablet Take 1 tablet (15 mg total) by mouth daily. 90 tablet 0   metFORMIN (GLUCOPHAGE) 1000 MG tablet TAKE 1 TABLET BY MOUTH TWICE DAILY WITH MEALS 180 tablet 0   Multiple Vitamins-Minerals (MULTIVITAMIN WITH  MINERALS) tablet Take 1 tablet by mouth daily. Woman's +     NOVOLIN N RELION 100 UNIT/ML injection INJECT 10 UNITS SUBCUTANEOUSLY IN THE MORNING AND 15 UNITS AT BEDTIME 10 mL 0   nystatin (MYCOSTATIN) 100000 UNIT/ML suspension Take 5 mLs (500,000 Units total) by mouth 4 (four) times daily. (Patient taking differently: Take 5 mLs by mouth daily as needed (thrush).) 473 mL 0   nystatin (MYCOSTATIN/NYSTOP) powder Apply 1 application. topically 3 (three) times daily. (Patient taking differently: Apply  1 application  topically daily.) 60 g 2   Omega-3 Fatty Acids (FISH OIL) 1000 MG CAPS Take 1,000 mg by mouth 2 (two) times daily.     OVER THE COUNTER MEDICATION Apply 1 application  topically at bedtime as needed (CBD Cream. Apply to feet).     pioglitazone (ACTOS) 45 MG tablet Take 1 tablet by mouth once daily 90 tablet 0   Probiotic Product (PROBIOTIC DAILY PO) Take 2 tablets by mouth at bedtime. Gummies     rosuvastatin (CRESTOR) 20 MG tablet Take 1 tablet (20 mg total) by mouth daily. 90 tablet 1   sitaGLIPtin (JANUVIA) 100 MG tablet Take 1 tablet (100 mg total) by mouth daily. 90 tablet 1   Accu-Chek Softclix Lancets lancets USE 1  TO CHECK GLUCOSE UP TO 4 TIMES DAILY 100 each PRN   AMBULATORY NON FORMULARY MEDICATION Blood sugar testing strips and lancets for TrueTrack glucometer.  Use to check blood sugar up to two times a day.  Dx: Type 2 Diabetes 100 Units 4   AMBULATORY NON FORMULARY MEDICATION Please provide needles for Tresiba pens 100 each 3   Blood Glucose Monitoring Suppl (ACCU-CHEK GUIDE ME) w/Device KIT USE TO CHECK BLOOD SUGAR UP TO 4 TIMES DAILY AS DIRECTED 1 kit 0   glucose blood (ACCU-CHEK GUIDE) test strip Use 1 up to 4 times daily to check blood sugar 100 each 12   ipratropium-albuterol (DUONEB) 0.5-2.5 (3) MG/3ML SOLN Take 3 mLs by nebulization every 6 (six) hours as needed. (Patient taking differently: Take 3 mLs by nebulization 2 (two) times daily as needed (wheezing/shortness of  breath).) 360 mL 1    Results for orders placed or performed during the hospital encounter of 10/24/22 (from the past 48 hour(s))  Glucose, capillary     Status: Abnormal   Collection Time: 10/24/22  5:44 AM  Result Value Ref Range   Glucose-Capillary 172 (H) 70 - 99 mg/dL    Comment: Glucose reference range applies only to samples taken after fasting for at least 8 hours.  CBC per protocol     Status: Abnormal   Collection Time: 10/24/22  6:02 AM  Result Value Ref Range   WBC 5.7 4.0 - 10.5 K/uL   RBC 3.85 (L) 3.87 - 5.11 MIL/uL   Hemoglobin 11.3 (L) 12.0 - 15.0 g/dL   HCT 34.9 (L) 36.0 - 46.0 %   MCV 90.6 80.0 - 100.0 fL   MCH 29.4 26.0 - 34.0 pg   MCHC 32.4 30.0 - 36.0 g/dL   RDW 15.8 (H) 11.5 - 15.5 %   Platelets 326 150 - 400 K/uL   nRBC 0.0 0.0 - 0.2 %    Comment: Performed at [facility], [place] [address] [place], [place] [address]  Basic metabolic panel per protocol     Status: Abnormal   Collection Time: 10/24/22  6:02 AM  Result Value Ref Range   Sodium 141 135 - 145 mmol/L   Potassium 3.9 3.5 - 5.1 mmol/L   Chloride 102 98 - 111 mmol/L   CO2 27 22 - 32 mmol/L   Glucose, Bld 163 (H) 70 - 99 mg/dL    Comment: Glucose reference range applies only to samples taken after fasting for at least 8 hours.   BUN 25 (H) 6 - 20 mg/dL   Creatinine, Ser 1.00 0.44 - 1.00 mg/dL   Calcium 9.2 8.9 - 10.3 mg/dL   GFR, Estimated >60 >60 mL/min    Comment: (NOTE) Calculated  using the CKD-EPI Creatinine Equation (2021)    Anion gap 12 5 - 15    Comment: Performed at [facility], [place] [address], [place], [place] [address]   No results found.  Review of Systems  All other systems reviewed and are negative.   Blood pressure (!) 146/63, pulse 76, temperature 98.2 F (36.8 C), temperature source Oral, resp. rate 17, height _0  (1.676 m), weight 135.2 kg, SpO2 95 %. Physical Exam  Patient is alert, oriented, no adenopathy, well-dressed, normal affect, normal  respiratory effort. Examination of the left elbow patient has no cellulitis there is no pain with flexion extension supination pronation of the elbow.  MRI scans have been negative for osteomyelitis.  Patient has clear gelatinous fluid and clear drainage from the wound.   Patient's most recent hemoglobin A1c is 7.3.  Most recent sed rate is 20 and most recent cultures were negative.  Most recent white cell count is 5.9 with a hemoglobin of 11.5. Assessment/Plan 1. Elbow wound, left, initial encounter       Plan: We will plan for a two-stage procedure.  Plan for surgery on Friday with debridement and sending tissue samples for cultures.  We will place her on an installation wound VAC and plan to return to the operating room the next Wednesday for application of Kerecis tissue powder and wound closure.  Risk and benefits were discussed including neurovascular injury need for additional surgery.  [name], MD 10/24/2022, 6:37 AM

## 2022-10-24 NOTE — Anesthesia Postprocedure Evaluation (Signed)
Anesthesia Post Note  Patient: Kathleen Le  Procedure(s) Performed: LEFT ELBOW DEBRIDEMENT (Left: Elbow)     Patient location during evaluation: PACU Anesthesia Type: General Level of consciousness: awake and alert Pain management: pain level controlled Vital Signs Assessment: post-procedure vital signs reviewed and stable Respiratory status: spontaneous breathing, nonlabored ventilation, respiratory function stable and patient connected to nasal cannula oxygen Cardiovascular status: blood pressure returned to baseline and stable Postop Assessment: no apparent nausea or vomiting Anesthetic complications: no   No notable events documented.  Last Vitals:  Vitals:   10/24/22 0940 10/24/22 1000  BP: (!) 103/56 106/65  Pulse: 66 64  Resp: 12 14  Temp: (!) 36.2 C 36.6 C  SpO2: 93% 93%    Last Pain:  Vitals:   10/24/22 1008  TempSrc:   PainSc: [place]

## 2022-10-24 NOTE — Evaluation (Signed)
Physical Therapy Evaluation Patient Details Name: Kathleen Le MRN: [id number] DOB: [birth year] Today's Date: 10/24/2022  History of Present Illness  61 yo female s/p L elbow debridement on 10/27. PMH includes L triceps tendon rupture repair with subsequent surgeries for debridement given infection, uterine cancer, COPD, OA, DMII, ETOH use in remission, HTN, peripheral neuropathy.  Clinical Impression   Pt presents with [facility] strength, balance, and mobility, main complaint at this time is post-operative LUE pain. Pt mobilizing in hallway at a mod I level, no physical assist needed for safe mobility at this time. PT recommending mobility specialists see pt daily, and will place an OT order to address LUE. PT to sign off, please reconsult as needed.    Recommendations for follow up therapy are one component of a multi-disciplinary discharge planning process, led by the attending physician.  Recommendations may be updated based on patient status, additional functional criteria and insurance authorization.  Follow Up Recommendations No PT follow up      Assistance Recommended at Discharge PRN  Patient can return home with the following       Equipment Recommendations None recommended by PT  Recommendations for Other Services       Functional Status Assessment Patient has not had a recent decline in their functional status     Precautions / Restrictions Precautions Precautions: Fall Restrictions Weight Bearing Restrictions: No      Mobility  Bed Mobility Overal bed mobility: Modified Independent             General bed mobility comments: increased time    Transfers Overall transfer level: Modified independent Equipment used: None               General transfer comment: mod I for increased time to rise/sit    Ambulation/Gait Ambulation/Gait assistance: Modified independent (Device/Increase time) Gait Distance (Feet): 125 Feet Assistive device: None Gait  Pattern/deviations: Step-through pattern, Decreased stride length Gait velocity: decr     General Gait Details: slightly increased time, otherwise [facility]  Stairs            Wheelchair Mobility    Modified Rankin (Stroke Patients Only)       Balance Overall balance assessment: Modified Independent                                           Pertinent Vitals/Pain Pain Assessment Pain Assessment: Faces Faces Pain Scale: Hurts little more Pain Location: L elbow Pain Descriptors / Indicators: Sore, Discomfort Pain Intervention(s): Limited activity within patient's tolerance, Monitored during session, Repositioned    Home Living Family/patient expects to be discharged to:: Private residence Living Arrangements: Spouse/significant other (truck driver, home 2 days/week) Available Help at Discharge: Family Type of Home: House Home Access: Level entry     Alternate Level Stairs-Number of Steps: stair lift Home Layout: Two level Home Equipment: Shower seat;Cane - single point;Rolling Environmental consultant (2 wheels)      Prior Function Prior Level of Function : Independent/Modified Independent                     Hand Dominance   Dominant Hand: Right    Extremity/Trunk Assessment   Upper Extremity Assessment Upper Extremity Assessment: Defer to OT evaluation    Lower Extremity Assessment Lower Extremity Assessment: Overall WFL for tasks assessed    Cervical / Trunk Assessment Cervical / Trunk Assessment: Normal  Communication   Communication: No difficulties  Cognition Arousal/Alertness: Awake/alert Behavior During Therapy: WFL for tasks assessed/performed Overall Cognitive Status: Within Functional Limits for tasks assessed                                          General Comments      Exercises     Assessment/Plan    PT Assessment Patient does not need any further PT services  PT Problem List         PT Treatment  Interventions      PT Goals (Current goals can be found in the Care Plan section)  Acute Rehab PT Goals PT Goal Formulation: With patient Time For Goal Achievement: 10/24/22 Potential to Achieve Goals: Good    Frequency       Co-evaluation               AM-PAC PT "6 Clicks" Mobility  Outcome Measure Help needed turning from your back to your side while in a flat bed without using bedrails?: None Help needed moving from lying on your back to sitting on the side of a flat bed without using bedrails?: None Help needed moving to and from a bed to a chair (including a wheelchair)?: None Help needed standing up from a chair using your arms (e.g., wheelchair or bedside chair)?: None Help needed to walk in hospital room?: None Help needed climbing 3-5 steps with a railing? : A Little 6 Click Score: 23    End of Session   Activity Tolerance: Patient tolerated treatment well Patient left: in bed;with call bell/phone within reach;with family/visitor present Nurse Communication: Mobility status PT Visit Diagnosis: Other abnormalities of gait and mobility (R26.89)    Time: 2440-1027 PT Time Calculation (min) (ACUTE ONLY): 26 min   Charges:   PT Evaluation $PT Eval Low Complexity: 1 Low PT Treatments $Therapeutic Activity: 8-22 mins      Stacie Glaze, PT DPT Acute Rehabilitation Services Pager [phone]  Office [phone]   Titania Gault E Stroup 10/24/2022, 3:10 PM

## 2022-10-25 LAB — GLUCOSE, CAPILLARY
Glucose-Capillary: 162 mg/dL — ABNORMAL HIGH (ref 70–99)
Glucose-Capillary: 105 mg/dL — ABNORMAL HIGH (ref 70–99)
Glucose-Capillary: 134 mg/dL — ABNORMAL HIGH (ref 70–99)
Glucose-Capillary: 201 mg/dL — ABNORMAL HIGH (ref 70–99)

## 2022-10-25 NOTE — Evaluation (Signed)
Occupational Therapy Evaluation Patient Details Name: Kathleen Le MRN: [id number] DOB: 10/11/1961 Today's Date: 10/25/2022   History of Present Illness 61 yo female s/p L elbow debridement on 10/27. PMH includes L triceps tendon rupture repair with subsequent surgeries for debridement given infection, uterine cancer, COPD, OA, DMII, ETOH use in remission, HTN, peripheral neuropathy.   Clinical Impression   Patient admitted for the diagnosis and procedure above.  PTA she lives at home with her spouse, who is able to assist as needed.  Currently she is limited due to pain around her left elbow.  She is needing Min A with ADL completion, but should return to her baseline with healing.  OT to follow along for HEP, and advise regarding any changes post additional I&D Wednesday.  Patient completed fist pumps, wrist flex/ext, gentle limited forearm sup/pro and elbow flex/ext, as well as AA shoulder flexion.  Patient educated on use of ice packs and elevation.       Recommendations for follow up therapy are one component of a multi-disciplinary discharge planning process, led by the attending physician.  Recommendations may be updated based on patient status, additional functional criteria and insurance authorization.   Follow Up Recommendations  Follow physician's recommendations for discharge plan and follow up therapies    Assistance Recommended at Discharge Set up Supervision/Assistance  Patient can return home with the following Assist for transportation    Functional Status Assessment  Patient has had a recent decline in their functional status and demonstrates the ability to make significant improvements in function in a reasonable and predictable amount of time.  Equipment Recommendations  None recommended by OT    Recommendations for Other Services       Precautions / Restrictions Precautions Precautions: Fall Restrictions Weight Bearing Restrictions: No Other Position/Activity  Restrictions: advised patient elbow flex/ext and forearm sup/pro to tolerance, and not to push it given natrue of the sx.  Gentle and limited to discomfort.      Mobility Bed Mobility Overal bed mobility: Modified Independent                  Transfers   Equipment used: None                      Balance Overall balance assessment: Modified Independent                                         ADL either performed or assessed with clinical judgement   ADL                   Upper Body Dressing : Minimal assistance;Sitting   Lower Body Dressing: Minimal assistance;Sit to/from stand Lower Body Dressing Details (indicate cue type and reason): limited use of L UE Toilet Transfer: Supervision/safety;Ambulation                   Vision Patient Visual Report: No change from baseline       Perception Perception Perception: Not tested   Praxis Praxis Praxis: Not tested    Pertinent Vitals/Pain Pain Assessment Faces Pain Scale: Hurts even more Pain Location: L elbow Pain Descriptors / Indicators: Sore, Discomfort, Grimacing, Operative site guarding Pain Intervention(s): Monitored during session     Hand Dominance Right   Extremity/Trunk Assessment Upper Extremity Assessment Upper Extremity Assessment: LUE deficits/detail LUE Deficits / Details: painful elbow and  forearm ROM. LUE Sensation: decreased light touch   Lower Extremity Assessment Lower Extremity Assessment: Overall WFL for tasks assessed   Cervical / Trunk Assessment Cervical / Trunk Assessment: Normal   Communication Communication Communication: No difficulties   Cognition Arousal/Alertness: Awake/alert Behavior During Therapy: WFL for tasks assessed/performed Overall Cognitive Status: Within Functional Limits for tasks assessed                                       General Comments   VSS on RA    Exercises     Shoulder Instructions       Home Living Family/patient expects to be discharged to:: Private residence Living Arrangements: Spouse/significant other Available Help at Discharge: Family Type of Home: House Home Access: Level entry     Home Layout: Two level Alternate Level Stairs-Number of Steps: stair lift   Bathroom Shower/Tub: Occupational psychologist: Standard     Home Equipment: Shower seat;Cane - single Barista (2 wheels)          Prior Functioning/Environment Prior Level of Function : Independent/Modified Independent                        OT Problem List: Decreased range of motion;Pain      OT Treatment/Interventions: Therapeutic exercise;Therapeutic activities;Self-care/ADL training    OT Goals(Current goals can be found in the care plan section) Acute Rehab OT Goals Patient Stated Goal: Return home OT Goal Formulation: With patient Time For Goal Achievement: 11/07/22 Potential to Achieve Goals: Good ADL Goals Pt Will Transfer to Toilet: Independently;ambulating Pt/caregiver will Perform Home Exercise Program: Increased ROM;Left upper extremity;Independently  OT Frequency: Min 2X/week    Co-evaluation              AM-PAC OT "6 Clicks" Daily Activity     Outcome Measure Help from another person eating meals?: None Help from another person taking care of personal grooming?: A Little Help from another person toileting, which includes using toliet, bedpan, or urinal?: A Little Help from another person bathing (including washing, rinsing, drying)?: A Little Help from another person to put on and taking off regular upper body clothing?: A Little Help from another person to put on and taking off regular lower body clothing?: A Little 6 Click Score: 19   End of Session Equipment Utilized During Treatment: Other (comment) (wound vac) Nurse Communication: Mobility status  Activity Tolerance: Patient tolerated treatment well Patient left: in bed;with  call bell/phone within reach;with family/visitor present  OT Visit Diagnosis: Pain Pain - Right/Left: Left Pain - part of body: Arm                Time: 0165-5374 OT Time Calculation (min): 22 min Charges:  OT General Charges $OT Visit: 1 Visit OT Evaluation $OT Eval Moderate Complexity: 1 Mod  10/25/2022  RP, OTR/L  Acute Rehabilitation Services  Office:  [phone]   Metta Clines 10/25/2022, 12:24 PM

## 2022-10-25 NOTE — Progress Notes (Signed)
Patient ID: Kathleen Le, female   DOB: 1961/02/02, 61 y.o.   MRN: [id number] Patient is postoperative day 1 debridement abscess left ankle.  There is 75 cc in the wound VAC canister she is on Maxipime.  Thank you for infectious disease consult.  Tissue cultures are pending.

## 2022-10-25 NOTE — Progress Notes (Signed)
    [place] for Infectious Disease   Reason for visit: Follow up on olecranon bursitis  Interval History: cultures without any growth to date  Physical Exam: Constitutional:  Vitals:   10/25/22 0513 10/25/22 0700  BP: 127/73 (!) 113/48  Pulse: 65 65  Resp: 17 17  Temp: 98.3 F (36.8 C) 98.2 F (36.8 C)  SpO2: 98% 95%    Impression: olecranon bursitis, possible osteomyelitis.   Plan: 1.  Continue antibiotics and will continue to monitor cultures

## 2022-10-25 NOTE — Plan of Care (Signed)

## 2022-10-26 LAB — GLUCOSE, CAPILLARY
Glucose-Capillary: 143 mg/dL — ABNORMAL HIGH (ref 70–99)
Glucose-Capillary: 157 mg/dL — ABNORMAL HIGH (ref 70–99)
Glucose-Capillary: 164 mg/dL — ABNORMAL HIGH (ref 70–99)
Glucose-Capillary: 184 mg/dL — ABNORMAL HIGH (ref 70–99)
Glucose-Capillary: 186 mg/dL — ABNORMAL HIGH (ref 70–99)

## 2022-10-27 ENCOUNTER — Encounter (HOSPITAL_COMMUNITY): Payer: Self-pay | Admitting: Orthopedic Surgery

## 2022-10-27 DIAGNOSIS — M71122 Other infective bursitis, left elbow: Secondary | ICD-10-CM | POA: Diagnosis not present

## 2022-10-27 LAB — BASIC METABOLIC PANEL
Anion gap: 9 (ref 5–15)
BUN: 23 mg/dL — ABNORMAL HIGH (ref 6–20)
CO2: 25 mmol/L (ref 22–32)
Calcium: 9.3 mg/dL (ref 8.9–10.3)
Chloride: 103 mmol/L (ref 98–111)
Creatinine, Ser: 0.97 mg/dL (ref 0.44–1.00)
GFR, Estimated: >60 mL/min (ref >60)
Glucose, Bld: 174 mg/dL — ABNORMAL HIGH (ref 70–99)
Potassium: 4.4 mmol/L (ref 3.5–5.1)
Sodium: 137 mmol/L (ref 135–145)

## 2022-10-27 LAB — GLUCOSE, CAPILLARY
Glucose-Capillary: 150 mg/dL — ABNORMAL HIGH (ref 70–99)
Glucose-Capillary: 101 mg/dL — ABNORMAL HIGH (ref 70–99)
Glucose-Capillary: 187 mg/dL — ABNORMAL HIGH (ref 70–99)
Glucose-Capillary: 140 mg/dL — ABNORMAL HIGH (ref 70–99)

## 2022-10-27 MED ORDER — SODIUM CHLORIDE 0.9 % IV SOLN
8.0000 mg/kg | Freq: Every day | INTRAVENOUS | Status: DC
  Administered 2022-10-28: 700 mg via INTRAVENOUS
  Filled 2022-10-27 (×2): qty 14

## 2022-10-27 MED ORDER — SODIUM CHLORIDE 0.9 % IV SOLN
2.0000 g | INTRAVENOUS | Status: DC
  Administered 2022-10-28: 2 g via INTRAVENOUS
  Filled 2022-10-27: qty 20

## 2022-10-27 NOTE — Progress Notes (Signed)
Pharmacy Antibiotic Note  Kathleen Le is a 61 y.o. female admitted on 10/24/2022 with  septic bursitis of the elbow .  Pharmacy has been consulted for vancomycin and cefepime dosing.  Patient has a history of left triceps rupture s/p surgical repair, complicated by drainage from incision ~2 weeks post-op. Has had multiple debridements, the last of which occurred on 9/20. Followed up with Dr. Sharol Given earlier this week for wound vac removal and was experiencing more purulence.   Underwent I&D this morning with wound vac placement. Wound cultures have been sent. Dr. Sharol Given planning to return to [place] on 11/1 for additional debridement and wound closure. Patient is afebrile and WBC is WNL.  Plan: Continue Vancomycin 1750 mg q24 hours (Scr 1 [last 10/27], eAUC 521, Vd coefficient 0.5 for BMI >30) Continue Cefepime 2g IV q8 hours Monitor clinical status, renal function, and culture data Follow-up additional I&D, LOT *Will plan levels prior to 4th dose - Peak after today's dose with BMP and Trough prior to dose tomorrow and re-evaluate  Height: '5\' 6"'$  (167.6 cm) Weight: 135.2 kg (298 lb) IBW/kg (Calculated) : 59.3  Temp (24hrs), Avg:98.3 F (36.8 C), Min:97.8 F (36.6 C), Max:98.5 F (36.9 C)  Recent Labs  Lab 10/24/22 0602  WBC 5.7  CREATININE 1.00    Estimated Creatinine Clearance: 85.8 mL/min (by C-G formula based on SCr of 1 mg/dL).    Allergies  Allergen Reactions   Gabapentin Nausea And Vomiting   Invokana [Canagliflozin] Other (See Comments)    Vaginitis    Latex Itching and Rash   Lyrica [Pregabalin] Other (See Comments)    Pain in feet increased and spasm    Antimicrobials this admission: 10/27 cefepime >>  10/27 vancomycin >>   Dose adjustments this admission: N/A  Microbiology results: 10/27 Wound Cx: sent  Thank you for allowing pharmacy to be a part of this patient's care.  Sloan Leiter, PharmD, BCPS, BCCCP Clinical Pharmacist Please refer to [facility] for [place] numbers 10/27/2022 9:36 AM

## 2022-10-27 NOTE — Progress Notes (Signed)
    [facility] for Infectious Disease    Date of Admission:  10/24/2022   Total days of antibiotics 4  ID: Kathleen Le is a 61 y.o. female with   Principal Problem:   Septic bursitis of elbow    Subjective: Afebrile; having nausea from pain medications  Medications:   amLODipine  5 mg Oral Daily   aspirin EC  81 mg Oral Daily   atenolol  50 mg Oral Daily   docusate sodium  100 mg Oral BID   DULoxetine  30 mg Oral BID   fluticasone furoate-vilanterol  1 puff Inhalation Daily   And   umeclidinium bromide  1 puff Inhalation Daily   glipiZIDE  10 mg Oral BID AC   hydrochlorothiazide  25 mg Oral Daily   insulin aspart  0-15 Units Subcutaneous TID WC   linagliptin  5 mg Oral Daily   lisinopril  5 mg Oral Daily   metFORMIN  1,000 mg Oral BID WC   pantoprazole  40 mg Oral Daily   pioglitazone  45 mg Oral Daily   rosuvastatin  20 mg Oral Daily    Objective: Vital signs in last 24 hours: Temp:  [97.8 F (36.6 C)-98.5 F (36.9 C)] 98.3 F (36.8 C) (10/30 0755) Pulse Rate:  [62-66] 64 (10/30 0755) Resp:  [17-18] 18 (10/30 0755) BP: (109-131)/(42-75) 131/52 (10/30 0755) SpO2:  [93 %-98 %] 94 % (10/30 0755)  Physical Exam  Constitutional:  oriented to person, place, and time. appears well-developed and well-nourished. No distress.  HENT: [place]/AT, PERRLA, no scleral icterus Mouth/Throat: Oropharynx is clear and moist. No oropharyngeal exudate.  Cardiovascular: Normal rate, regular rhythm and normal heart sounds. Exam reveals no gallop and no friction rub.  No murmur heard.  Pulmonary/Chest: Effort normal and breath sounds normal. No respiratory distress.  has no wheezes.  WYO:VZCH elbow wrapped Abdominal: Soft. Bowel sounds are normal.  exhibits no distension. There is no tenderness.  Lymphadenopathy: no cervical adenopathy. No axillary adenopathy Neurological: alert and oriented to person, place, and time.  Skin: Skin is warm and dry. No rash noted. No erythema.   Psychiatric: a normal mood and affect.  behavior is normal.    Lab Results  Sedimentation Rate Recent Labs    10/24/22 1855  ESRSEDRATE 32*   C-Reactive Protein Recent Labs    10/24/22 1708  CRP <0.5    Microbiology: Negative Hx of GNR on gram stain -- 05/12/2022 Studies/Results: No results found.   Assessment/Plan: Recurrent/poorly healing left elbow wound requiring repeat debridement = necrotic tissue/triceps tendon was debrided on 10/27. Currently on vancomycin and cefepime. Will change cefepime to ceftriaxone. Patient to have wound closure on Wednesday per dr duda. Will add AFB cx.  Nausea = will recommend to premedicate with zofran prior to giving pain medication   [facility] for Infectious Diseases Pager: [phone]  10/27/2022, 1:59 PM

## 2022-10-27 NOTE — Progress Notes (Signed)
Mobility Specialist Progress Note:   10/27/22 1439  Mobility  Activity Ambulated independently in hallway  Level of Assistance Modified independent, requires aide device or extra time  Assistive Device Other (Comment) (IV Pole)  Distance Ambulated (ft) 500 ft  Activity Response Tolerated well  Mobility Referral Yes  $Mobility charge 1 Mobility   Pt received sitting EOB and agreeable. No complaints. Pt left sitting EOB with all needs met and call bell in reach.   Maebry Obrien Mobility Specialist-Acute Rehab Secure Chat only

## 2022-10-27 NOTE — Progress Notes (Signed)
Patient ID: Kathleen Le, female   DOB: 05-Jan-1961, 61 y.o.   MRN: [id number] Patient is status post debridement left elbow.  There is 100 cc of clear serosanguineous drainage in the wound VAC canister.  Cultures are negative to date.  We will plan for return to the operating room on Wednesday for repeat debridement and possible wound closure.  Patient has no complaints this morning.

## 2022-10-28 ENCOUNTER — Other Ambulatory Visit (HOSPITAL_COMMUNITY): Payer: Self-pay

## 2022-10-28 DIAGNOSIS — M71122 Other infective bursitis, left elbow: Secondary | ICD-10-CM | POA: Diagnosis not present

## 2022-10-28 LAB — GLUCOSE, CAPILLARY
Glucose-Capillary: 126 mg/dL — ABNORMAL HIGH (ref 70–99)
Glucose-Capillary: 131 mg/dL — ABNORMAL HIGH (ref 70–99)
Glucose-Capillary: 214 mg/dL — ABNORMAL HIGH (ref 70–99)
Glucose-Capillary: 130 mg/dL — ABNORMAL HIGH (ref 70–99)

## 2022-10-28 LAB — CBC WITH DIFFERENTIAL/PLATELET
Abs Immature Granulocytes: 0.01 10*3/uL (ref 0.00–0.07)
Basophils Absolute: 0 10*3/uL (ref 0.0–0.1)
Basophils Relative: 0 %
Eosinophils Absolute: 0.1 10*3/uL (ref 0.0–0.5)
Eosinophils Relative: 3 %
HCT: 33.1 % — ABNORMAL LOW (ref 36.0–46.0)
Hemoglobin: 10.8 g/dL — ABNORMAL LOW (ref 12.0–15.0)
Immature Granulocytes: 0 %
Lymphocytes Relative: 28 %
Lymphs Abs: 1.4 10*3/uL (ref 0.7–4.0)
MCH: 29.7 pg (ref 26.0–34.0)
MCHC: 32.6 g/dL (ref 30.0–36.0)
MCV: 90.9 fL (ref 80.0–100.0)
Monocytes Absolute: 0.7 10*3/uL (ref 0.1–1.0)
Monocytes Relative: 15 %
Neutro Abs: 2.6 10*3/uL (ref 1.7–7.7)
Neutrophils Relative %: 54 %
Platelets: 303 10*3/uL (ref 150–400)
RBC: 3.64 MIL/uL — ABNORMAL LOW (ref 3.87–5.11)
RDW: 14.9 % (ref 11.5–15.5)
WBC: 4.8 10*3/uL (ref 4.0–10.5)
nRBC: 0 % (ref 0.0–0.2)

## 2022-10-28 LAB — CK: Total CK: 64 U/L (ref 38–234)

## 2022-10-28 MED ORDER — CHLORHEXIDINE GLUCONATE 4 % EX LIQD
60.0000 mL | Freq: Once | CUTANEOUS | Status: DC
  Filled 2022-10-28: qty 60

## 2022-10-28 MED ORDER — POVIDONE-IODINE 10 % EX SWAB
2.0000 | Freq: Once | CUTANEOUS | Status: AC
  Administered 2022-10-29: 2 via TOPICAL

## 2022-10-28 MED ORDER — CEFAZOLIN IN SODIUM CHLORIDE 3-0.9 GM/100ML-% IV SOLN
3.0000 g | INTRAVENOUS | Status: AC
  Administered 2022-10-29: 3 g via INTRAVENOUS
  Filled 2022-10-28 (×2): qty 100

## 2022-10-28 NOTE — Progress Notes (Signed)
Patient ID: Kathleen Le, female   DOB: [birth year], 61 y.o.   MRN: [id number] Patient is seen in follow-up status post debridement septic bursitis left elbow.  Cultures are still negative.  Plan for return to the operating room tomorrow for repeat debridement application of tissue graft and wound closure.  325 cc in the wound VAC canister of clear serosanguineous fluid.

## 2022-10-28 NOTE — H&P (View-Only) (Signed)
Patient ID: Kathleen Le, female   DOB: 11/06/1961, 61 y.o.   MRN: [id number] Patient is seen in follow-up status post debridement septic bursitis left elbow.  Cultures are still negative.  Plan for return to the operating room tomorrow for repeat debridement application of tissue graft and wound closure.  325 cc in the wound VAC canister of clear serosanguineous fluid.

## 2022-10-28 NOTE — Progress Notes (Signed)
Occupational Therapy Treatment Patient Details Name: Kathleen Le MRN: [id number] DOB: 09/19/1961 Today's Date: 10/28/2022   History of present illness 61 yo female s/p L elbow debridement on 10/27. PMH includes L triceps tendon rupture repair with subsequent surgeries for debridement given infection, uterine cancer, COPD, OA, DMII, ETOH use in remission, HTN, peripheral neuropathy.   OT comments  Pt currently still with AROM limitations in elbow flexion of the left arm and shoulder flexion.  Pain still at a 6/10 overall.  Educated on AAROM and AROM exercises with handout provided as reference.  Will attempt to see one more time post debridement and then recommend progression of rehab outpatient per MD recommendations.     Recommendations for follow up therapy are one component of a multi-disciplinary discharge planning process, led by the attending physician.  Recommendations may be updated based on patient status, additional functional criteria and insurance authorization.    Follow Up Recommendations  Follow physician's recommendations for discharge plan and follow up therapies    Assistance Recommended at Discharge PRN  Patient can return home with the following  Assist for transportation;A little help with bathing/dressing/bathroom;Assistance with cooking/housework   Equipment Recommendations  None recommended by OT       Precautions / Restrictions Precautions Precautions: Fall Precaution Comments: wound vac       Mobility Bed Mobility                    Transfers                         Balance Overall balance assessment: Independent                                         ADL either performed or assessed with clinical judgement   ADL Overall ADL's : Needs assistance/impaired                                       General ADL Comments: Provided education and handout on AROM/AAROM exercises for the left shoulder,  elbow, wrist, and digits.  Pt able to to return demonstrate all exercises for 2 sets of 10 repetitions of each.  Also had her complete 2 sets of 10 reps for digit flexion with use of a foam ball for resistance.  Discussed using ball for 10 reps as well and to monitor for any joint pain secondary to history of osteoarthritis.  If pain in hand other than musculoskeletal, then refrain from using and just do AROM.  Pt with limited AAROM shoulder flexion 1-110 degrees as well as elbow flexion AROM 10-100 degrees secondary to end range extensor limitation.  Will see again post debridement.               Cognition Arousal/Alertness: Awake/alert Behavior During Therapy: WFL for tasks assessed/performed Overall Cognitive Status: Within Functional Limits for tasks assessed                                                     Pertinent Vitals/ Pain       Pain Assessment Pain Assessment: 0-10 Pain Score: 6  Pain Location: L elbow Pain Descriptors / Indicators: Discomfort Pain Intervention(s): Limited activity within patient's tolerance, Monitored during session, Repositioned         Frequency  Min 2X/week        Progress Toward Goals  OT Goals(current goals can now be found in the care plan section)  Progress towards OT goals: Progressing toward goals  Acute Rehab OT Goals Patient Stated Goal: Get her arm fixed once and for all, she's been dealing with this for over a year. OT Goal Formulation: With patient Time For Goal Achievement: 11/07/22 Potential to Achieve Goals: Good  Plan Discharge plan remains appropriate       AM-PAC OT "6 Clicks" Daily Activity     Outcome Measure   Help from another person eating meals?: None Help from another person taking care of personal grooming?: A Little Help from another person toileting, which includes using toliet, bedpan, or urinal?: A Little Help from another person bathing (including washing, rinsing, drying)?: A  Little Help from another person to put on and taking off regular upper body clothing?: A Little Help from another person to put on and taking off regular lower body clothing?: A Little 6 Click Score: 19    End of Session Equipment Utilized During Treatment: Other (comment) (wound vac)  OT Visit Diagnosis: Pain Pain - Right/Left: Left Pain - part of body: Arm   Activity Tolerance Patient tolerated treatment well   Patient Left in bed;with call bell/phone within reach   Nurse Communication          Time: 6160-7371 OT Time Calculation (min): 29 min  Charges: OT General Charges $OT Visit: 1 Visit OT Treatments $Therapeutic Exercise: 23-37 mins  [name] OTR/L 10/28/2022, 3:52 PM

## 2022-10-28 NOTE — Progress Notes (Signed)
[facility] for Infectious Disease    Date of Admission:  10/24/2022   Total days of antibiotics 5           ID: Kathleen Le is a 61 y.o. female with hx of poorly healing septic bursitis of left elbow requiring repeat IX D Principal Problem:   Septic bursitis of elbow    Subjective: Feeling better. Less swelling and pain to left elbow some improvement with flexion. No fevers.   Had 384m output from wound drain.  Cx remain negative  ROS: no fever, chills, nightsweats.  Medications:   amLODipine  5 mg Oral Daily   aspirin EC  81 mg Oral Daily   atenolol  50 mg Oral Daily   docusate sodium  100 mg Oral BID   DULoxetine  30 mg Oral BID   fluticasone furoate-vilanterol  1 puff Inhalation Daily   And   umeclidinium bromide  1 puff Inhalation Daily   glipiZIDE  10 mg Oral BID AC   hydrochlorothiazide  25 mg Oral Daily   insulin aspart  0-15 Units Subcutaneous TID WC   linagliptin  5 mg Oral Daily   lisinopril  5 mg Oral Daily   metFORMIN  1,000 mg Oral BID WC   pantoprazole  40 mg Oral Daily   pioglitazone  45 mg Oral Daily   rosuvastatin  20 mg Oral Daily    Objective: Vital signs in last 24 hours: Temp:  [97.9 F (36.6 C)-98.4 F (36.9 C)] 97.9 F (36.6 C) (10/31 0738) Pulse Rate:  [61-74] 71 (10/31 0738) Resp:  [18] 18 (10/31 0738) BP: (113-128)/(52-101) 113/95 (10/31 0738) SpO2:  [94 %-98 %] 96 % (10/31 0738)  Physical Exam  Constitutional:  oriented to person, place, and time. appears well-developed and well-nourished. No distress.  HENT: [place]/AT, PERRLA, no scleral icterus Mouth/Throat: Oropharynx is clear and moist. No oropharyngeal exudate.  Cardiovascular: Normal rate, regular rhythm and normal heart sounds. Exam reveals no gallop and no friction rub.  No murmur heard.  Pulmonary/Chest: Effort normal and breath sounds normal. No respiratory distress.  has no wheezes.  Neck = supple, no nuchal rigidity Abdominal: Soft. Bowel sounds are normal.   exhibits no distension. There is no tenderness.  Ext: left elbow wound vac in place Lymphadenopathy: no cervical adenopathy. No axillary adenopathy Neurological: alert and oriented to person, place, and time.  Skin: Skin is warm and dry. No rash noted. No erythema.  Psychiatric: a normal mood and affect.  behavior is normal.    Lab Results Recent Labs    10/27/22 1635 10/28/22 0544  WBC  --  4.8  HGB  --  10.8*  HCT  --  33.1*  NA 137  --   K 4.4  --   CL 103  --   CO2 25  --   BUN 23*  --   CREATININE 0.97  --    Lab Results  Component Value Date   ESRSEDRATE 32 (H) 10/24/2022    Microbiology: reviewed Studies/Results: No results found.   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 Will arrange for long acting infusion that will provide gram positive coverage (oritavancin) either in the hospital tomorrow or if can arrange as outpatient infusion on Thursday. Then will give levofloxacin x 4 wk upon discharge.  Will continue to follow tomorrow to see results of repeat  IX D and wound closure  CCaren Griffins  [facility] for Infectious Diseases Pager: [phone]  10/28/2022, 4:00 PM

## 2022-10-29 ENCOUNTER — Encounter (HOSPITAL_COMMUNITY)
Admission: RE | Disposition: A | Discharge status: Home / Self Care | Payer: Self-pay | Source: Ambulatory Visit | Attending: Orthopedic Surgery

## 2022-10-29 ENCOUNTER — Encounter (HOSPITAL_COMMUNITY): Payer: Self-pay | Admitting: Orthopedic Surgery

## 2022-10-29 ENCOUNTER — Inpatient Hospital Stay (HOSPITAL_COMMUNITY): Payer: 59 | Admitting: Certified Registered Nurse Anesthetist

## 2022-10-29 ENCOUNTER — Other Ambulatory Visit (HOSPITAL_COMMUNITY): Payer: Self-pay

## 2022-10-29 DIAGNOSIS — Z87891 Personal history of nicotine dependence: Secondary | ICD-10-CM | POA: Diagnosis not present

## 2022-10-29 DIAGNOSIS — J449 Chronic obstructive pulmonary disease, unspecified: Secondary | ICD-10-CM

## 2022-10-29 DIAGNOSIS — I10 Essential (primary) hypertension: Secondary | ICD-10-CM

## 2022-10-29 DIAGNOSIS — L02414 Cutaneous abscess of left upper limb: Secondary | ICD-10-CM | POA: Diagnosis not present

## 2022-10-29 DIAGNOSIS — R69 Illness, unspecified: Secondary | ICD-10-CM | POA: Diagnosis not present

## 2022-10-29 DIAGNOSIS — M009 Pyogenic arthritis, unspecified: Secondary | ICD-10-CM | POA: Diagnosis not present

## 2022-10-29 DIAGNOSIS — M71122 Other infective bursitis, left elbow: Secondary | ICD-10-CM | POA: Diagnosis not present

## 2022-10-29 HISTORY — PX: I & D EXTREMITY: SHX5045

## 2022-10-29 LAB — SURGICAL PCR SCREEN
MRSA, PCR: NEGATIVE
Staphylococcus aureus: NEGATIVE

## 2022-10-29 LAB — AEROBIC/ANAEROBIC CULTURE W GRAM STAIN (SURGICAL/DEEP WOUND)
Culture: NO GROWTH
Gram Stain: NONE SEEN

## 2022-10-29 LAB — GLUCOSE, CAPILLARY
Glucose-Capillary: 155 mg/dL — ABNORMAL HIGH (ref 70–99)
Glucose-Capillary: 219 mg/dL — ABNORMAL HIGH (ref 70–99)
Glucose-Capillary: 219 mg/dL — ABNORMAL HIGH (ref 70–99)
Glucose-Capillary: 220 mg/dL — ABNORMAL HIGH (ref 70–99)

## 2022-10-29 SURGERY — IRRIGATION AND DEBRIDEMENT EXTREMITY
Anesthesia: General | Laterality: Left

## 2022-10-29 MED ORDER — DEXAMETHASONE SODIUM PHOSPHATE 10 MG/ML IJ SOLN
INTRAMUSCULAR | Status: DC | PRN
  Administered 2022-10-29: 4 mg via INTRAVENOUS

## 2022-10-29 MED ORDER — ORAL CARE MOUTH RINSE: 15.0000 mL | Freq: Once | OROMUCOSAL | Status: AC

## 2022-10-29 MED ORDER — ONDANSETRON HCL 4 MG/2ML IJ SOLN
INTRAMUSCULAR | Status: AC
  Filled 2022-10-29: qty 2

## 2022-10-29 MED ORDER — KETOROLAC TROMETHAMINE 30 MG/ML IJ SOLN
INTRAMUSCULAR | Status: DC | PRN
  Administered 2022-10-29: 30 mg via INTRAVENOUS

## 2022-10-29 MED ORDER — 0.9 % SODIUM CHLORIDE (POUR BTL) OPTIME
TOPICAL | Status: DC | PRN
  Administered 2022-10-29: 1000 mL

## 2022-10-29 MED ORDER — FENTANYL CITRATE (PF) 250 MCG/5ML IJ SOLN
INTRAMUSCULAR | Status: DC | PRN
  Administered 2022-10-29 (×3): 50 ug via INTRAVENOUS

## 2022-10-29 MED ORDER — ONDANSETRON HCL 4 MG/2ML IJ SOLN
INTRAMUSCULAR | Status: DC | PRN
  Administered 2022-10-29: 4 mg via INTRAVENOUS

## 2022-10-29 MED ORDER — CHLORHEXIDINE GLUCONATE 0.12 % MT SOLN: 15.0000 mL | Freq: Once | OROMUCOSAL | Status: AC

## 2022-10-29 MED ORDER — KETOROLAC TROMETHAMINE 30 MG/ML IJ SOLN
INTRAMUSCULAR | Status: AC
  Filled 2022-10-29: qty 1

## 2022-10-29 MED ORDER — ACETAMINOPHEN 500 MG PO TABS
1000.0000 mg | ORAL_TABLET | Freq: Once | ORAL | Status: AC
  Administered 2022-10-29: 1000 mg via ORAL
  Filled 2022-10-29: qty 2

## 2022-10-29 MED ORDER — INSULIN ASPART 100 UNIT/ML IJ SOLN
0.0000 [IU] | INTRAMUSCULAR | Status: DC | PRN
  Administered 2022-10-29: 4 [IU] via SUBCUTANEOUS

## 2022-10-29 MED ORDER — HYDROCODONE-ACETAMINOPHEN 5-325 MG PO TABS: 1.0000 | ORAL_TABLET | ORAL | 0 refills | Status: DC | PRN

## 2022-10-29 MED ORDER — AMISULPRIDE (ANTIEMETIC) 5 MG/2ML IV SOLN: 10.0000 mg | Freq: Once | INTRAVENOUS | Status: DC | PRN

## 2022-10-29 MED ORDER — LEVOFLOXACIN 750 MG PO TABS
750.0000 mg | ORAL_TABLET | Freq: Every day | ORAL | 0 refills | Status: DC
  Filled 2022-10-29: qty 28, 28d supply, fill #0

## 2022-10-29 MED ORDER — LIDOCAINE 2% (20 MG/ML) 5 ML SYRINGE
INTRAMUSCULAR | Status: DC | PRN
  Administered 2022-10-29: 60 mg via INTRAVENOUS

## 2022-10-29 MED ORDER — CHLORHEXIDINE GLUCONATE 0.12 % MT SOLN
OROMUCOSAL | Status: AC
  Administered 2022-10-29: 15 mL via OROMUCOSAL
  Filled 2022-10-29: qty 15

## 2022-10-29 MED ORDER — LEVOFLOXACIN 750 MG PO TABS: 750.0000 mg | ORAL_TABLET | Freq: Every day | ORAL | 0 refills | Status: AC

## 2022-10-29 MED ORDER — DEXAMETHASONE SODIUM PHOSPHATE 10 MG/ML IJ SOLN
INTRAMUSCULAR | Status: AC
  Filled 2022-10-29: qty 1

## 2022-10-29 MED ORDER — PROPOFOL 10 MG/ML IV BOLUS
INTRAVENOUS | Status: DC | PRN
  Administered 2022-10-29: 100 mg via INTRAVENOUS
  Administered 2022-10-29: 200 mg via INTRAVENOUS

## 2022-10-29 MED ORDER — FENTANYL CITRATE (PF) 100 MCG/2ML IJ SOLN: 25.0000 ug | INTRAMUSCULAR | Status: DC | PRN

## 2022-10-29 MED ORDER — MIDAZOLAM HCL 2 MG/2ML IJ SOLN
INTRAMUSCULAR | Status: AC
  Filled 2022-10-29: qty 2

## 2022-10-29 MED ORDER — PROPOFOL 10 MG/ML IV BOLUS
INTRAVENOUS | Status: AC
  Filled 2022-10-29: qty 20

## 2022-10-29 MED ORDER — ORITAVANCIN DIPHOSPHATE 400 MG IV SOLR
1200.0000 mg | Freq: Once | INTRAVENOUS | Status: AC
  Administered 2022-10-29: 1200 mg via INTRAVENOUS
  Filled 2022-10-29: qty 120

## 2022-10-29 MED ORDER — FENTANYL CITRATE (PF) 250 MCG/5ML IJ SOLN
INTRAMUSCULAR | Status: AC
  Filled 2022-10-29: qty 5

## 2022-10-29 MED ORDER — LACTATED RINGERS IV SOLN: INTRAVENOUS | Status: DC

## 2022-10-29 MED ORDER — INSULIN ASPART 100 UNIT/ML IJ SOLN
INTRAMUSCULAR | Status: AC
  Filled 2022-10-29: qty 1

## 2022-10-29 MED ORDER — MIDAZOLAM HCL 2 MG/2ML IJ SOLN
INTRAMUSCULAR | Status: DC | PRN
  Administered 2022-10-29: 2 mg via INTRAVENOUS

## 2022-10-29 SURGICAL SUPPLY — 34 items
BAG COUNTER SPONGE SURGICOUNT (BAG) IMPLANT
BLADE SURG 21 STRL SS (BLADE) ×1 IMPLANT
BNDG COHESIVE 6X5 TAN STRL LF (GAUZE/BANDAGES/DRESSINGS) IMPLANT
BNDG GAUZE DERMACEA FLUFF 4 (GAUZE/BANDAGES/DRESSINGS) ×2 IMPLANT
COVER SURGICAL LIGHT HANDLE (MISCELLANEOUS) ×2 IMPLANT
DRAPE DERMATAC (DRAPES) IMPLANT
DRAPE U-SHAPE 47X51 STRL (DRAPES) ×1 IMPLANT
DRESSING PEEL AND PLC PRVNA 13 (GAUZE/BANDAGES/DRESSINGS) IMPLANT
DRSG ADAPTIC 3X8 NADH LF (GAUZE/BANDAGES/DRESSINGS) ×1 IMPLANT
DRSG PEEL AND PLACE PREVENA 13 (GAUZE/BANDAGES/DRESSINGS) ×1
DURAPREP 26ML APPLICATOR (WOUND CARE) ×1 IMPLANT
ELECT REM PT RETURN 9FT ADLT (ELECTROSURGICAL)
ELECTRODE REM PT RTRN 9FT ADLT (ELECTROSURGICAL) IMPLANT
GAUZE SPONGE 4X4 12PLY STRL (GAUZE/BANDAGES/DRESSINGS) ×1 IMPLANT
GLOVE BIOGEL PI IND STRL 9 (GLOVE) ×1 IMPLANT
GLOVE SURG ORTHO 9.0 STRL STRW (GLOVE) ×1 IMPLANT
GOWN STRL REUS W/ TWL XL LVL3 (GOWN DISPOSABLE) ×2 IMPLANT
GOWN STRL REUS W/TWL XL LVL3 (GOWN DISPOSABLE) ×2
GRAFT SKIN WND MICRO 38 (Tissue) IMPLANT
HANDPIECE INTERPULSE COAX TIP (DISPOSABLE)
KIT BASIN OR (CUSTOM PROCEDURE TRAY) ×1 IMPLANT
KIT TURNOVER KIT B (KITS) ×1 IMPLANT
MANIFOLD NEPTUNE II (INSTRUMENTS) ×1 IMPLANT
NS IRRIG 1000ML POUR BTL (IV SOLUTION) ×1 IMPLANT
PACK ORTHO EXTREMITY (CUSTOM PROCEDURE TRAY) ×1 IMPLANT
PAD ARMBOARD 7.5X6 YLW CONV (MISCELLANEOUS) ×2 IMPLANT
SET HNDPC FAN SPRY TIP SCT (DISPOSABLE) IMPLANT
STOCKINETTE IMPERVIOUS 9X36 MD (GAUZE/BANDAGES/DRESSINGS) IMPLANT
SUT ETHILON 2 0 PSLX (SUTURE) ×1 IMPLANT
SWAB COLLECTION DEVICE MRSA (MISCELLANEOUS) ×1 IMPLANT
SWAB CULTURE ESWAB REG 1ML (MISCELLANEOUS) IMPLANT
TOWEL GREEN STERILE (TOWEL DISPOSABLE) ×1 IMPLANT
TUBE CONNECTING 12X1/4 (SUCTIONS) ×1 IMPLANT
YANKAUER SUCT BULB TIP NO VENT (SUCTIONS) ×1 IMPLANT

## 2022-10-29 NOTE — Progress Notes (Signed)
[place] [facility] for Infectious Disease    Date of Admission:  10/24/2022   Total days of antibiotics 6          ID: Kathleen Le is a 61 y.o. female with  culture negative Principal Problem:   Septic bursitis of elbow    Subjective: Afebrile. Still having discomfort to left elbow but improving. Having pain to right forearm where PIV infiltrated  Medications:   acetaminophen  1,000 mg Oral Once   [MAR Hold] amLODipine  5 mg Oral Daily   [MAR Hold] aspirin EC  81 mg Oral Daily   [MAR Hold] atenolol  50 mg Oral Daily   chlorhexidine  60 mL Topical Once   [MAR Hold] docusate sodium  100 mg Oral BID   [MAR Hold] DULoxetine  30 mg Oral BID   [MAR Hold] fluticasone furoate-vilanterol  1 puff Inhalation Daily   And   [MAR Hold] umeclidinium bromide  1 puff Inhalation Daily   [MAR Hold] glipiZIDE  10 mg Oral BID AC   [MAR Hold] hydrochlorothiazide  25 mg Oral Daily   [MAR Hold] insulin aspart  0-15 Units Subcutaneous TID WC   [MAR Hold] linagliptin  5 mg Oral Daily   [MAR Hold] lisinopril  5 mg Oral Daily   [MAR Hold] metFORMIN  1,000 mg Oral BID WC   [MAR Hold] pantoprazole  40 mg Oral Daily   [MAR Hold] pioglitazone  45 mg Oral Daily   [MAR Hold] rosuvastatin  20 mg Oral Daily    Objective: Vital signs in last 24 hours: Temp:  [97.6 F (36.4 C)-98.1 F (36.7 C)] 98.1 F (36.7 C) (11/01 0753) Pulse Rate:  [62-83] 69 (11/01 0753) Resp:  [16-18] 16 (11/01 0753) BP: (129-138)/(55-61) 138/61 (11/01 0753) SpO2:  [95 %-97 %] 95 % (11/01 0753)  Physical Exam  Constitutional:  oriented to person, place, and time. appears well-developed and well-nourished. No distress.  HENT: [place]/AT, PERRLA, no scleral icterus Mouth/Throat: Oropharynx is clear and moist. No oropharyngeal exudate.  Cardiovascular: Normal rate, regular rhythm and normal heart sounds. Exam reveals no gallop and no friction rub.  No murmur heard.  Pulmonary/Chest: Effort normal and breath sounds normal. No  respiratory distress.  has no wheezes.  UMP:NTIR elbow c/d/I incision with dressing overlay   right inner forearm bruising, tenderness from piv infiltrate Abdominal: Soft. Bowel sounds are normal.  exhibits no distension. There is no tenderness.  Lymphadenopathy: no cervical adenopathy. No axillary adenopathy Neurological: alert and oriented to person, place, and time.  Skin: Skin is warm and dry. No rash noted. No erythema.  Psychiatric: a normal mood and affect.  behavior is normal.    Lab Results Recent Labs    10/27/22 1635 10/28/22 0544  WBC  --  4.8  HGB  --  10.8*  HCT  --  33.1*  NA 137  --   K 4.4  --   CL 103  --   CO2 25  --   BUN 23*  --   CREATININE 0.97  --    Lab Results  Component Value Date   ESRSEDRATE 32 (H) 10/24/2022     Microbiology: reviewed Studies/Results: No results found.   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 Will give IV oritavancin today which will provide gram positive coverage for 10 days and will schedule next infusion at infusion center in 10  days. Plus will send in rx for levofloxacin '750mg'$  daily by mouth to avoid getting picc line  Wound vac and wound care per dr duda.  Will plan to see in the ID clinic in 10-14 days.  [facility] for Infectious Diseases Pager: [phone]  10/29/2022, 2:55 PM

## 2022-10-29 NOTE — Op Note (Signed)
10/29/2022  4:15 PM  PATIENT:  Kathleen Le    PRE-OPERATIVE DIAGNOSIS:  Abscess Left Elbow  POST-OPERATIVE DIAGNOSIS:  Same  PROCEDURE:  LEFT ELBOW DEBRIDEMENT of skin and soft tissue muscle and bone. Application of Kerecis 38 cm micro graft. Application of Prevena 13 cm wound VAC.  SURGEON:  [name], MD  PHYSICIAN ASSISTANT:None ANESTHESIA:   General  PREOPERATIVE INDICATIONS:  Kathleen Le is a  61 y.o. female with a diagnosis of Abscess Left Elbow who failed conservative measures and elected for surgical management.    The risks benefits and alternatives were discussed with the patient preoperatively including but not limited to the risks of infection, bleeding, nerve injury, cardiopulmonary complications, the need for revision surgery, among others, and the patient was willing to proceed.  OPERATIVE IMPLANTS: 38 cm Kerecis micro graft.  '@ENCIMAGES'$ @  OPERATIVE FINDINGS: There is good healthy granulation tissue no purulence no abscess no necrotic tissue.  OPERATIVE PROCEDURE: Patient was brought the operating room and underwent a general anesthetic.  After adequate levels anesthesia were obtained patient's left upper extremity was prepped using DuraPrep draped into a sterile field a timeout was called.  The cleanse choice wound VAC sponge was removed there is good granulating wound bed.  The skin and soft tissue muscle fascia and bone was resected back to healthy viable bleeding margins.  This was irrigated with normal saline.  The wound was filled with Kerecis 38 cm micro powder.  The wound wound was closed with 2-0 nylon over the tissue graft.  This was connected to a 13 cm Prevena wound VAC this had a good suction fit patient was extubated taken the PACU in stable condition.   DISCHARGE PLANNING:  Antibiotic duration: Continue antibiotics with Levaquin  Weightbearing: Not applicable  Pain medication: Prescription for Vicodin  Dressing care/ Wound VAC: Continue  wound VAC for 1 week  Ambulatory devices: Not applicable  Discharge to: Home  Follow-up: In the office 1 week post operative.

## 2022-10-29 NOTE — Interval H&P Note (Signed)
History and Physical Interval Note:  10/29/2022 7:00 AM  Kathleen Le  has presented today for surgery, with the diagnosis of Abscess Left Elbow.  The various methods of treatment have been discussed with the patient and family. After consideration of risks, benefits and other options for treatment, the patient has consented to  Procedure(s): LEFT ELBOW DEBRIDEMENT (Left) as a surgical intervention.  The patient's history has been reviewed, patient examined, no change in status, stable for surgery.  I have reviewed the patient's chart and labs.  Questions were answered to the patient's satisfaction.     [name]

## 2022-10-29 NOTE — Anesthesia Procedure Notes (Signed)
Procedure Name: LMA Insertion Date/Time: 10/29/2022 3:27 PM  Performed by: Carolan Clines, CRNAPre-anesthesia Checklist: Patient identified, Emergency Drugs available, Suction available and Patient being monitored Patient Re-evaluated:Patient Re-evaluated prior to induction Oxygen Delivery Method: Circle System Utilized Preoxygenation: Pre-oxygenation with 100% oxygen Induction Type: IV induction LMA: LMA inserted LMA Size: 4.0 Number of attempts: 1 Placement Confirmation: positive ETCO2 Tube secured with: Tape Dental Injury: Teeth and Oropharynx as per pre-operative assessment

## 2022-10-29 NOTE — Anesthesia Preprocedure Evaluation (Signed)
Anesthesia Evaluation  Patient identified by MRN, date of birth, ID band Patient awake    Reviewed: Allergy & Precautions, H&P , NPO status , Patient's Chart, lab work & pertinent test results  Airway Mallampati: II   Neck ROM: full    Dental   Pulmonary shortness of breath, asthma , COPD, Patient abstained from smoking., former smoker   breath sounds clear to auscultation       Cardiovascular hypertension,  Rhythm:regular Rate:Normal     Neuro/Psych  PSYCHIATRIC DISORDERS Anxiety Depression       GI/Hepatic Neg liver ROS,GERD  ,,  Endo/Other  diabetes, Type 2  Morbid obesity  Renal/GU negative Renal ROS     Musculoskeletal  (+) Arthritis ,    Abdominal   Peds  Hematology negative hematology ROS (+)   Anesthesia Other Findings   Reproductive/Obstetrics                             Anesthesia Physical Anesthesia Plan  ASA: 3  Anesthesia Plan: General   Post-op Pain Management: Tylenol PO (pre-op)* and Toradol IV (intra-op)*   Induction: Intravenous  PONV Risk Score and Plan: 3 and Ondansetron, Dexamethasone, Midazolam and Treatment may vary due to age or medical condition  Airway Management Planned: LMA  Additional Equipment:   Intra-op Plan:   Post-operative Plan: Extubation in OR  Informed Consent: I have reviewed the patients History and Physical, chart, labs and discussed the procedure including the risks, benefits and alternatives for the proposed anesthesia with the patient or authorized representative who has indicated his/her understanding and acceptance.       Plan Discussed with: CRNA, Anesthesiologist and Surgeon  Anesthesia Plan Comments: ( )        Anesthesia Quick Evaluation

## 2022-10-29 NOTE — Progress Notes (Signed)
Mobility Specialist Progress Note:   10/29/22 0942  Mobility  Activity Ambulated independently in room  Level of Assistance Modified independent, requires aide device or extra time  Assistive Device None  Distance Ambulated (ft) 50 ft  Activity Response Tolerated well  Mobility Referral Yes  $Mobility charge 1 Mobility   Pt received sitting EOB and agreeable. C/o of nausea. Declined further mobility d/t increased nausea with ambulation. Pt left sitting EOB with all needs met and call bell in reach.   Donette Mainwaring Mobility Specialist-Acute Rehab Secure Chat only

## 2022-10-29 NOTE — Transfer of Care (Signed)
Immediate Anesthesia Transfer of Care Note  Patient: Kathleen Le  Procedure(s) Performed: LEFT ELBOW DEBRIDEMENT (Left)  Patient Location: PACU  Anesthesia Type:General  Level of Consciousness: awake, alert  and oriented  Airway & Oxygen Therapy: Patient Spontanous Breathing  Post-op Assessment: Report given to RN and Post -op Vital signs reviewed and stable  Post vital signs: Reviewed and stable  Last Vitals:  Vitals Value Taken Time  BP 171/67 10/29/22 1610  Temp    Pulse 87 10/29/22 1613  Resp 13 10/29/22 1613  SpO2 94 % 10/29/22 1613  Vitals shown include unvalidated device data.  Last Pain:  Vitals:   10/29/22 0817  TempSrc:   PainSc: 2       Patients Stated Pain Goal: 2 (36/85/99 2341)  Complications: No notable events documented.

## 2022-10-29 NOTE — Anesthesia Postprocedure Evaluation (Signed)
Anesthesia Post Note  Patient: Londa Mackowski  Procedure(s) Performed: LEFT ELBOW DEBRIDEMENT (Left)     Patient location during evaluation: PACU Anesthesia Type: General Level of consciousness: awake and alert Pain management: pain level controlled Vital Signs Assessment: post-procedure vital signs reviewed and stable Respiratory status: spontaneous breathing, nonlabored ventilation, respiratory function stable and patient connected to nasal cannula oxygen Cardiovascular status: blood pressure returned to baseline and stable Postop Assessment: no apparent nausea or vomiting Anesthetic complications: no   No notable events documented.  Last Vitals:  Vitals:   10/29/22 1645 10/29/22 1649  BP: (!) 157/75   Pulse: 72 72  Resp: 13 13  Temp: 36.6 C   SpO2: 91% 94%    Last Pain:  Vitals:   10/29/22 1630  TempSrc:   PainSc: 0-No pain                 Tiajuana Amass

## 2022-10-29 NOTE — Progress Notes (Signed)
Patient is being discharged home. All discharge instructions reviewed including new medications and wound care for wound vac. Patient verbalized full understanding of all. Patients sister is her ride home.

## 2022-10-29 NOTE — TOC Initial Note (Addendum)
Transition of Care [facility]) - Initial/Assessment Note    Patient Details  Name: Kathleen Le MRN: [id number] Date of Birth: Jul 21, 1961  Transition of [facility]) CM/SW Contact:    Curlene Labrum, RN Phone Number: 10/29/2022, 10:30 AM  Clinical Narrative:                 CM met with the patient at the bedside to discuss TOC needs.  The patient states that she will return to the OR today at 2 pm for scheduled surgery today with Dr. Sharol Given at 3 pm.  ID continues to follow - plan to avoid need for PICC line.  The patient is hopeful that she will discharge home this evening.  The patient states that her sister plans to drive her home by car today after surgery.  10/29/2022 1348 - I called and spoke with [facility], [place], [facility] and [facility] and they are not in network with patient's insurance for availability of home health for provision of IV infusions.  The patient states that she has poor venous access and does not want a PICC line.  PIV in right arm at this time is 20G and was placed with US guidance for placement.  The patient has reddened area from old PIV stick noted in her Right antecubital area.  Carolynn Sayers, RNCM with [facility] states that the patient would not be a candidate for IV home infusions and will updated ID physician.  The patient will need outpatient follow up for IV medications or remain inpatient after surgery for needed IV antibiotics.  Carolynn Sayers, RNCM with [facility] plans to communicate with ID this afternoon.  The patient is scheduled to have surgery this afternoon for 3 pm surgery with Dr. Sharol Given.  Expected Discharge Plan: Home/Self Care Barriers to Discharge: Continued Medical Work up   Patient Goals and CMS Choice Patient states their goals for this hospitalization and ongoing recovery are:: To return home CMS Medicare.gov Compare Post Acute Care list provided to:: Patient    Expected Discharge Plan and Services Expected Discharge Plan: Home/Self Care    Discharge Planning Services: CM Consult   Living arrangements for the past 2 months: Single Family Home                                      Prior Living Arrangements/Services Living arrangements for the past 2 months: Single Family Home Lives with:: Spouse Patient language and need for interpreter reviewed:: Yes Do you feel safe going back to the place where you live?: Yes      Need for Family Participation in Patient Care: Yes (Comment) Care giver support system in place?: Yes (comment) Current home services: DME (Glucometer, currently has home oxygen - not using O2 at this time,) Criminal Activity/Legal Involvement Pertinent to Current Situation/Hospitalization: No - Comment as needed  Activities of Daily Living Home Assistive Devices/Equipment: Cane (specify quad or straight), Walker (specify type), Shower chair with back, CBG Meter, Blood pressure cuff, Eyeglasses, Nebulizer, Oxygen (pt states she is no longer using Oxygen at home) ADL Screening (condition at time of admission) Patient's cognitive ability adequate to safely complete daily activities?: Yes Is the patient deaf or have difficulty hearing?: No Does the patient have difficulty seeing, even when wearing glasses/contacts?: No Does the patient have difficulty concentrating, remembering, or making decisions?: No Patient able to express need for assistance with ADLs?: Yes Does the patient have difficulty  dressing or bathing?: No Independently performs ADLs?: Yes (appropriate for developmental age) Does the patient have difficulty walking or climbing stairs?: Yes Weakness of Legs: Right Weakness of Arms/Hands: Both  Permission Sought/Granted Permission sought to share information with : Case Manager, Family Supports, PCP          Permission granted to share info w Relationship: sister - [phone] will provide transportation to home     Emotional Assessment Appearance:: Appears stated  age Attitude/Demeanor/Rapport: Gracious Affect (typically observed): Accepting Orientation: : Oriented to Self, Oriented to Place, Oriented to  Time, Oriented to Situation Alcohol / Substance Use: Not Applicable Psych Involvement: No (comment)  Admission diagnosis:  Septic bursitis of elbow [M71.129] Patient Active Problem List   Diagnosis Date Noted   Septic bursitis of elbow 10/24/2022   COPD exacerbation ([place]) 09/02/2022   Bursitis of elbow 05/28/2022   Elbow wound, left, initial encounter 05/11/2022   Centrilobular emphysema ([place]) 05/09/2022   Triceps tendon rupture, left, initial encounter 02/07/2022   Fall 12/31/2021   Contusion of left arm 12/31/2021   Acute left-sided low back pain without sciatica 12/31/2021   Neck pain 12/31/2021   Encounter for smoking cessation counseling 11/15/2021   Chronic obstructive pulmonary disease ([place]) 02/26/2021   Hyperlipidemia associated with type 2 diabetes mellitus ([place]) 11/05/2020   Non-restorative sleep 11/05/2020   Snoring 11/05/2020   Anterolisthesis 07/30/2020   Acute stress reaction 09/12/2018   Current smoker 06/08/2018   Diabetes mellitus, type II, insulin dependent ([place]) 06/08/2018   Claustrophobia 06/08/2018   Primary insomnia 06/08/2018   Hot flashes 06/08/2018   Abnormal weight gain 06/08/2018   Lower leg edema 11/10/2017   Insomnia due to other mental disorder 10/27/2017   History of substance abuse ([place]) 10/27/2017   GAD (generalized anxiety disorder) 10/27/2017   Class 3 severe obesity due to excess calories with serious comorbidity and body mass index (BMI) of 40.0 to 44.9 in adult ([place]) 08/31/2017   Cerumen impaction 01/24/2015   Hyperlipidemia 01/24/2015   Gastroesophageal reflux disease with esophagitis 01/10/2015   Foot callus 10/03/2014   PTSD (post-traumatic stress disorder) 09/26/2014   Depression 05/19/2014   Anxiety 05/19/2014   Chronic bilateral low back pain with right-sided sciatica 05/19/2014   DDD  (degenerative disc disease), lumbar 05/19/2014   Neuropathy 02/05/2014   IT band syndrome 02/05/2014   Sinus tachycardia 02/05/2014   Memory changes 02/05/2014   Word finding difficulty 02/05/2014   Cervical cancer ([place]) 02/05/2014   Type II diabetes mellitus, uncontrolled 02/03/2014   Unspecified asthma(493.90) 02/03/2014   Essential hypertension, benign 02/03/2014   Pulmonary emphysema ([place]) 07/26/2012   PCP:  Donella Stade, PA-C Pharmacy:   [place], [place] - [facility] [address] [place] [address] Phone: [phone] Fax: [phone]     Social Determinants of Health (SDOH) Interventions    Readmission Risk Interventions    10/29/2022   10:30 AM  Readmission Risk Prevention Plan  Transportation Screening Complete  PCP or Specialist Appt within 5-7 Days Complete  Home Care Screening Complete  Medication Review (RN CM) Complete

## 2022-10-30 ENCOUNTER — Encounter (HOSPITAL_COMMUNITY): Payer: Self-pay | Admitting: Orthopedic Surgery

## 2022-10-30 ENCOUNTER — Other Ambulatory Visit (HOSPITAL_COMMUNITY): Payer: Self-pay

## 2022-10-30 ENCOUNTER — Encounter: Payer: Self-pay | Admitting: Orthopedic Surgery

## 2022-10-30 ENCOUNTER — Other Ambulatory Visit: Payer: Self-pay

## 2022-10-30 ENCOUNTER — Inpatient Hospital Stay (HOSPITAL_COMMUNITY): Admit: 2022-10-30 | Payer: 59

## 2022-11-02 NOTE — Discharge Summary (Signed)
Discharge Diagnoses:  Principal Problem:   Septic bursitis of elbow   Surgeries: Procedure(s): LEFT ELBOW DEBRIDEMENT on 10/29/2022    Consultants:   Discharged Condition: Improved  Hospital Course: Kathleen Le is an 61 y.o. female who was admitted 10/24/2022 with a chief complaint of abscess left elbow, with a final diagnosis of Abscess Left Elbow.  Patient was brought to the operating room on 10/29/2022 and underwent Procedure(s): LEFT ELBOW DEBRIDEMENT.    Patient was given perioperative antibiotics:  Anti-infectives (From admission, onward)    Start     Dose/Rate Route Frequency Ordered Stop   10/29/22 1145  Oritavancin Diphosphate (ORBACTIV) 1,200 mg in dextrose 5 % IVPB        1,200 mg 333.3 mL/hr over 180 Minutes Intravenous Once 10/29/22 1048 10/29/22 1628   10/29/22 0600  ceFAZolin (ANCEF) IVPB 3g/100 mL premix        3 g 200 mL/hr over 30 Minutes Intravenous On call to O.R. 10/28/22 1728 10/29/22 1529   10/29/22 0000  levofloxacin (LEVAQUIN) 750 MG tablet  Status:  Discontinued        750 mg Oral Daily 10/29/22 1501 10/29/22    10/29/22 0000  levofloxacin (LEVAQUIN) 750 MG tablet        750 mg Oral Daily 10/29/22 1735 11/26/22 2359   10/28/22 1600  DAPTOmycin (CUBICIN) 700 mg in sodium chloride 0.9 % IVPB  Status:  Discontinued        8 mg/kg  89.7 kg (Adjusted) 128 mL/hr over 30 Minutes Intravenous Daily 10/27/22 1422 10/29/22 1048   10/27/22 1500  cefTRIAXone (ROCEPHIN) 2 g in sodium chloride 0.9 % 100 mL IVPB  Status:  Discontinued        2 g 200 mL/hr over 30 Minutes Intravenous Every 24 hours 10/27/22 1407 10/29/22 2301   10/25/22 1400  vancomycin (VANCOREADY) IVPB 1750 mg/350 mL  Status:  Discontinued        1,750 mg 175 mL/hr over 120 Minutes Intravenous Every 24 hours 10/24/22 1254 10/27/22 1422   10/24/22 1400  ceFEPIme (MAXIPIME) 2 g in sodium chloride 0.9 % 100 mL IVPB  Status:  Discontinued        2 g 200 mL/hr over 30 Minutes Intravenous Every 8 hours  10/24/22 1254 10/27/22 1407   10/24/22 1400  vancomycin (VANCOREADY) IVPB 2000 mg/400 mL        2,000 mg 200 mL/hr over 120 Minutes Intravenous  Once 10/24/22 1254 10/24/22 1614   10/24/22 1330  ceFAZolin (ANCEF) IVPB 2g/100 mL premix  Status:  Discontinued        2 g 200 mL/hr over 30 Minutes Intravenous Every 6 hours 10/24/22 1003 10/24/22 1256   10/24/22 0600  ceFAZolin (ANCEF) IVPB 3g/100 mL premix        3 g 200 mL/hr over 30 Minutes Intravenous On call to O.R. 10/24/22 0536 10/24/22 0754   10/24/22 0552  ceFAZolin (ANCEF) 3-0.9 GM/100ML-% IVPB       Note to Pharmacy: Sundra Aland H: cabinet override      10/24/22 0552 10/24/22 0726     .  Patient was given sequential compression devices, early ambulation, and aspirin for DVT prophylaxis.  Recent vital signs: No data found..  Recent laboratory studies: No results found.  Discharge Medications:   Allergies as of 10/29/2022       Reactions   Gabapentin Nausea And Vomiting   Invokana [canagliflozin] Other (See Comments)   Vaginitis   Latex Itching, Rash   Lyrica [  pregabalin] Other (See Comments)   Pain in feet increased and spasm        Medication List     STOP taking these medications    cefadroxil 500 MG capsule Commonly known as: DURICEF   doxycycline 100 MG tablet Commonly known as: VIBRA-TABS       TAKE these medications    Accu-Chek Guide Me w/Device Kit USE TO CHECK BLOOD SUGAR UP TO 4 TIMES DAILY AS DIRECTED   Accu-Chek Guide test strip Generic drug: glucose blood Use 1 up to 4 times daily to check blood sugar   Accu-Chek Softclix Lancets lancets USE 1  TO CHECK GLUCOSE UP TO 4 TIMES DAILY   albuterol 108 (90 Base) MCG/ACT inhaler Commonly known as: VENTOLIN HFA INHALE 1 TO 2 PUFFS INTO LUNGS EVERY 6 HOURS AS NEEDED FOR WHEEZING OR SHORTNESS OF BREATH What changed: See the new instructions.   AMBULATORY NON FORMULARY MEDICATION Blood sugar testing strips and lancets for [employer]  glucometer.  Use to check blood sugar up to two times a day.  Dx: Type 2 Diabetes   AMBULATORY NON FORMULARY MEDICATION Please provide needles for Tresiba pens   amLODipine 5 MG tablet Commonly known as: NORVASC Take 1 tablet by mouth once daily   aspirin EC 81 MG tablet Take 81 mg by mouth daily.   atenolol 50 MG tablet Commonly known as: TENORMIN Take 1 tablet (50 mg total) by mouth daily.   CALCARB 600 PO Take 600 mg by mouth daily.   doxepin 25 MG capsule Commonly known as: SINEQUAN TAKE 1 TO 2 CAPSULES BY MOUTH AT BEDTIME AS NEEDED FOR SLEEP What changed: See the new instructions.   DULoxetine 30 MG capsule Commonly known as: CYMBALTA Take 1 capsule by mouth twice daily   esomeprazole 20 MG capsule Commonly known as: NEXIUM Take 20 mg by mouth daily.   Fish Oil 1000 MG Caps Take 1,000 mg by mouth 2 (two) times daily.   Flax Seed Oil 1000 MG Caps Take 1,000 mg by mouth daily.   furosemide 40 MG tablet Commonly known as: LASIX Take 1 tablet (40 mg total) by mouth daily as needed. What changed: reasons to take this   glipiZIDE 10 MG tablet Commonly known as: GLUCOTROL TAKE 1 TABLET BY MOUTH TWICE DAILY BEFORE A MEAL   hydrochlorothiazide 25 MG tablet Commonly known as: HYDRODIURIL Take 1 tablet (25 mg total) by mouth daily.   HYDROcodone-acetaminophen 5-325 MG tablet Commonly known as: NORCO/VICODIN Take 1 tablet by mouth every 4 (four) hours as needed.   INSULIN SYRINGE .3CC/31GX5/16" 31G X 5/16" 0.3 ML Misc Dx DM E11.9. Inject insulin daily at bedtime.   ipratropium-albuterol 0.5-2.5 (3) MG/3ML Soln Commonly known as: DUONEB Take 3 mLs by nebulization every 6 (six) hours as needed. What changed:  when to take this reasons to take this   levofloxacin 750 MG tablet Commonly known as: Levaquin Take 1 tablet (750 mg total) by mouth daily for 28 days.   lisinopril 5 MG tablet Commonly known as: ZESTRIL Take 1 tablet by mouth once daily    MAGNESIUM GLUCONATE PO Take 420 mg by mouth daily.   meloxicam 15 MG tablet Commonly known as: MOBIC Take 1 tablet (15 mg total) by mouth daily.   metFORMIN 1000 MG tablet Commonly known as: GLUCOPHAGE TAKE 1 TABLET BY MOUTH TWICE DAILY WITH MEALS   multivitamin with minerals tablet Take 1 tablet by mouth daily. Woman's +   NovoLIN N ReliOn 100 UNIT/ML  injection Generic drug: insulin NPH Human INJECT 10 UNITS SUBCUTANEOUSLY IN THE MORNING AND 15 UNITS AT BEDTIME   nystatin 100000 UNIT/ML suspension Commonly known as: MYCOSTATIN Take 5 mLs (500,000 Units total) by mouth 4 (four) times daily. What changed:  when to take this reasons to take this   nystatin powder Commonly known as: MYCOSTATIN/NYSTOP Apply 1 application. topically 3 (three) times daily. What changed: when to take this   OVER THE COUNTER MEDICATION Apply 1 application  topically at bedtime as needed (CBD Cream. Apply to feet).   pioglitazone 45 MG tablet Commonly known as: ACTOS Take 1 tablet by mouth once daily   PROBIOTIC DAILY PO Take 2 tablets by mouth at bedtime. Gummies   rosuvastatin 20 MG tablet Commonly known as: CRESTOR Take 1 tablet (20 mg total) by mouth daily.   sitaGLIPtin 100 MG tablet Commonly known as: Januvia Take 1 tablet (100 mg total) by mouth daily.   Trelegy Ellipta 100-62.5-25 MCG/ACT Aepb Generic drug: Fluticasone-Umeclidin-Vilant Inhale 1 puff into the lungs daily.        Diagnostic Studies: No results found.  Patient benefited maximally from their hospital stay and there were no complications.     Disposition: Discharge disposition: 01-Home or Self Care      Discharge Instructions     Call MD / Call 911   Complete by: As directed    If you experience chest pain or shortness of breath, CALL 911 and be transported to the hospital emergency room.  If you develope a fever above 101 F, pus (white drainage) or increased drainage or redness at the wound, or calf  pain, call your surgeon's office.   Constipation Prevention   Complete by: As directed    Drink plenty of fluids.  Prune juice may be helpful.  You may use a stool softener, such as Colace (over the counter) 100 mg twice a day.  Use MiraLax (over the counter) for constipation as needed.   Diet - low sodium heart healthy   Complete by: As directed    Increase activity slowly as tolerated   Complete by: As directed    Negative Pressure Wound Therapy - Incisional   Complete by: As directed    Negative Pressure Wound Therapy - Incisional   Complete by: As directed    Post-operative opioid taper instructions:   Complete by: As directed    POST-OPERATIVE OPIOID TAPER INSTRUCTIONS: It is important to wean off of your opioid medication as soon as possible. If you do not need pain medication after your surgery it is ok to stop day one. Opioids include: Codeine, Hydrocodone(Norco, Vicodin), Oxycodone(Percocet, oxycontin) and hydromorphone amongst others.  Long term and even short term use of opiods can cause: Increased pain response Dependence Constipation Depression Respiratory depression And more.  Withdrawal symptoms can include Flu like symptoms Nausea, vomiting And more Techniques to manage these symptoms Hydrate well Eat regular healthy meals Stay active Use relaxation techniques(deep breathing, meditating, yoga) Do Not substitute Alcohol to help with tapering If you have been on opioids for less than two weeks and do not have pain than it is ok to stop all together.  Plan to wean off of opioids This plan should start within one week post op of your joint replacement. Maintain the same interval or time between taking each dose and first decrease the dose.  Cut the total daily intake of opioids by one tablet each day Next start to increase the time between doses. The last  dose that should be eliminated is the evening dose.      Sling   Complete by: As directed         Follow-up Information     [name], MD Follow up in 1 week(s).   Specialty: Orthopedic Surgery Contact information: [address] [place] [address] [phone]                  Signed: [name] 11/02/2022, 10:54 AM

## 2022-11-03 ENCOUNTER — Encounter: Payer: Self-pay | Admitting: Orthopedic Surgery

## 2022-11-03 LAB — ACID FAST SMEAR (AFB, MYCOBACTERIA): Acid Fast Smear: NEGATIVE

## 2022-11-04 ENCOUNTER — Ambulatory Visit: Payer: 59 | Admitting: Family

## 2022-11-04 ENCOUNTER — Other Ambulatory Visit: Payer: Self-pay | Admitting: Physician Assistant

## 2022-11-04 ENCOUNTER — Encounter: Payer: Self-pay | Admitting: Family

## 2022-11-04 DIAGNOSIS — E119 Type 2 diabetes mellitus without complications: Secondary | ICD-10-CM

## 2022-11-04 DIAGNOSIS — S51002D Unspecified open wound of left elbow, subsequent encounter: Secondary | ICD-10-CM

## 2022-11-04 DIAGNOSIS — E1165 Type 2 diabetes mellitus with hyperglycemia: Secondary | ICD-10-CM

## 2022-11-04 NOTE — Progress Notes (Signed)
Post-Op Visit Note   Patient: Kathleen Le           Date of Birth: 08-28-1961           MRN: [id number] Visit Date: 11/04/2022 PCP: Donella Stade, PA-C  Chief Complaint:  Chief Complaint  Patient presents with   Left Elbow - Routine Post Op    10/24/2022 and 10/29/2022 left elbow debridement with graft     HPI:  HPI The patient is a 61 year old woman seen status post left elbow debridement with wound VAC placement this was removed today Ortho Exam On examination left elbow the incision is approximated with sutures there is moderate serosanguineous drainage.  No significant edema erythema or warmth  Visit Diagnoses: No diagnosis found.  Plan: She will begin daily Dial soap cleansing silver cell and ABD dressing changes with Ace wrap.  She will follow-up in 1 week  Follow-Up Instructions: No follow-ups on file.   Imaging: No results found.  Orders:  No orders of the defined types were placed in this encounter.  No orders of the defined types were placed in this encounter.    PMFS History: Patient Active Problem List   Diagnosis Date Noted   Septic bursitis of elbow 10/24/2022   COPD exacerbation ([place]) 09/02/2022   Bursitis of elbow 05/28/2022   Elbow wound, left, initial encounter 05/11/2022   Centrilobular emphysema ([place]) 05/09/2022   Triceps tendon rupture, left, initial encounter 02/07/2022   Fall 12/31/2021   Contusion of left arm 12/31/2021   Acute left-sided low back pain without sciatica 12/31/2021   Neck pain 12/31/2021   Encounter for smoking cessation counseling 11/15/2021   Chronic obstructive pulmonary disease ([place]) 02/26/2021   Hyperlipidemia associated with type 2 diabetes mellitus ([place]) 11/05/2020   Non-restorative sleep 11/05/2020   Snoring 11/05/2020   Anterolisthesis 07/30/2020   Acute stress reaction 09/12/2018   Current smoker 06/08/2018   Diabetes mellitus, type II, insulin dependent ([place]) 06/08/2018   Claustrophobia 06/08/2018    Primary insomnia 06/08/2018   Hot flashes 06/08/2018   Abnormal weight gain 06/08/2018   Lower leg edema 11/10/2017   Insomnia due to other mental disorder 10/27/2017   History of substance abuse ([place]) 10/27/2017   GAD (generalized anxiety disorder) 10/27/2017   Class 3 severe obesity due to excess calories with serious comorbidity and body mass index (BMI) of 40.0 to 44.9 in adult ([place]) 08/31/2017   Cerumen impaction 01/24/2015   Hyperlipidemia 01/24/2015   Gastroesophageal reflux disease with esophagitis 01/10/2015   Foot callus 10/03/2014   PTSD (post-traumatic stress disorder) 09/26/2014   Depression 05/19/2014   Anxiety 05/19/2014   Chronic bilateral low back pain with right-sided sciatica 05/19/2014   DDD (degenerative disc disease), lumbar 05/19/2014   Neuropathy 02/05/2014   IT band syndrome 02/05/2014   Sinus tachycardia 02/05/2014   Memory changes 02/05/2014   Word finding difficulty 02/05/2014   Cervical cancer ([place]) 02/05/2014   Type II diabetes mellitus, uncontrolled 02/03/2014   Unspecified asthma(493.90) 02/03/2014   Essential hypertension, benign 02/03/2014   Pulmonary emphysema ([place]) 07/26/2012   Past Medical History:  Diagnosis Date   Anxiety    Arthritis    Asthma    Cancer ([place])    uterine   COPD (chronic obstructive pulmonary disease) ([place])    Degenerative disc disease, lumbar    Diabetes mellitus without complication ([place])    Type II   Dyspnea    with exertion due to COPD   GERD (gastroesophageal reflux disease)  History of alcohol abuse    20-30 years ago, patient was going through divorce but does not currently drink any alcohol   History of kidney stones    passed   History of marijuana use    "its been a long time since I last smoked because of my COPD"   History of ovarian cancer 1984   Hypertension    Peripheral neuropathy    Pneumonia    Substance abuse ([place])    patient denies, states she has only ever used marijuana   Swelling of  both lower extremities     Family History  Problem Relation Age of Onset   Cancer Paternal Grandmother    Diabetes Paternal Grandmother    Diabetes Paternal Grandfather    Heart attack Maternal Aunt    Cancer Maternal Uncle    Cancer Maternal Grandmother    Heart attack Mother    Cancer Mother    Stroke Mother    Diabetes Father    Heart attack Father    Diabetes Paternal Uncle    Diabetes Paternal Uncle     Past Surgical History:  Procedure Laterality Date   CERVICAL CONIZATION W/BX  1985   DILATION AND CURETTAGE OF UTERUS     ECTOPIC PREGNANCY SURGERY     patient stated she had to have tube removed   HARDWARE REMOVAL Left 09/17/2022   Procedure: LEFT ELBOW HARDWARE REMOVAL;  Surgeon: Leandrew Koyanagi, MD;  Location: [facility];  Service: Orthopedics;  Laterality: Left;   I & D EXTREMITY Left 05/12/2022   Procedure: IRRIGATION AND DEBRIDEMENT LEFT ELBOW;  Surgeon: Leandrew Koyanagi, MD;  Location: [facility];  Service: Orthopedics;  Laterality: Left;   I & D EXTREMITY Left 07/18/2022   Procedure: LEFT ELBOW IRRIGATION AND DEBRIDEMENT EXTREMITY;  Surgeon: Leandrew Koyanagi, MD;  Location: [facility];  Service: Orthopedics;  Laterality: Left;   I & D EXTREMITY Left 10/24/2022   Procedure: LEFT ELBOW DEBRIDEMENT;  Surgeon: [name], MD;  Location: [place] [facility];  Service: Orthopedics;  Laterality: Left;   I & D EXTREMITY Left 10/29/2022   Procedure: LEFT ELBOW DEBRIDEMENT;  Surgeon: [name], MD;  Location: [facility];  Service: Orthopedics;  Laterality: Left;   IRRIGATION AND DEBRIDEMENT ELBOW Left 09/17/2022   Procedure: LEFT IRRIGATION AND DEBRIDEMENT ELBOW;  Surgeon: Leandrew Koyanagi, MD;  [facility];  Service: Orthopedics;  Laterality: Left;   TONSILLECTOMY     TRICEPS TENDON REPAIR Left 02/07/2022   Procedure: left triceps repair;  Surgeon: Leandrew Koyanagi, MD;  Location: [facility];  Service: Orthopedics;  Laterality: Left;   Social History   Occupational History   Not on file   Tobacco Use   Smoking status: Former    Packs/day: 0.25    Years: 35.00    Total pack years: 8.75    Types: Cigarettes    Quit date: 08/22/2022    Years since quitting: 0.2    Passive exposure: Never   Smokeless tobacco: Never   Tobacco comments:    Did smoke cigarettes over the weekend.  Vaping Use   Vaping Use: Never used  Substance and Sexual Activity   Alcohol use: Not Currently   Drug use: Not Currently    Types: Marijuana    Comment: 10/23/22- has not used since 2019   Sexual activity: Yes    Birth control/protection: Post-menopausal

## 2022-11-06 ENCOUNTER — Telehealth: Payer: Self-pay | Admitting: Orthopedic Surgery

## 2022-11-06 NOTE — Telephone Encounter (Signed)
Patient called in and was advised to come back in 1 week to come in on Tuesday by Junie Panning but she is only available that afternoon, she needs stitches removed, please advise

## 2022-11-06 NOTE — Telephone Encounter (Signed)
Scheduled

## 2022-11-06 NOTE — Telephone Encounter (Signed)
Can you call her and see if she can come in at 3pm Tuesday afternoon to see Dr. Sharol Given? If so I will open a spot, thanks!

## 2022-11-07 ENCOUNTER — Ambulatory Visit (HOSPITAL_COMMUNITY)
Admission: RE | Admit: 2022-11-07 | Discharge: 2022-11-07 | Disposition: A | Discharge status: Home / Self Care | Payer: 59 | Source: Ambulatory Visit | Attending: Internal Medicine | Admitting: Internal Medicine

## 2022-11-07 VITALS — BP 118/50 | HR 68 | Temp 97.3°F | Resp 16 | Wt 298.0 lb

## 2022-11-07 DIAGNOSIS — M71122 Other infective bursitis, left elbow: Secondary | ICD-10-CM | POA: Insufficient documentation

## 2022-11-07 LAB — COMPREHENSIVE METABOLIC PANEL
ALT: 15 U/L (ref 0–44)
AST: 21 U/L (ref 15–41)
Albumin: 3.2 g/dL — ABNORMAL LOW (ref 3.5–5.0)
Alkaline Phosphatase: 83 U/L (ref 38–126)
Anion gap: 13 (ref 5–15)
BUN: 18 mg/dL (ref 6–20)
CO2: 28 mmol/L (ref 22–32)
Calcium: 9.2 mg/dL (ref 8.9–10.3)
Chloride: 101 mmol/L (ref 98–111)
Creatinine, Ser: 0.95 mg/dL (ref 0.44–1.00)
GFR, Estimated: >60 mL/min (ref >60)
Glucose, Bld: 290 mg/dL — ABNORMAL HIGH (ref 70–99)
Potassium: 4 mmol/L (ref 3.5–5.1)
Sodium: 142 mmol/L (ref 135–145)
Total Bilirubin: 0.3 mg/dL (ref 0.3–1.2)
Total Protein: 6.4 g/dL — ABNORMAL LOW (ref 6.5–8.1)

## 2022-11-07 LAB — C-REACTIVE PROTEIN: CRP: 0.7 mg/dL (ref <1.0)

## 2022-11-07 LAB — CBC
HCT: 35.2 % — ABNORMAL LOW (ref 36.0–46.0)
Hemoglobin: 11 g/dL — ABNORMAL LOW (ref 12.0–15.0)
MCH: 29.1 pg (ref 26.0–34.0)
MCHC: 31.3 g/dL (ref 30.0–36.0)
MCV: 93.1 fL (ref 80.0–100.0)
Platelets: 348 10*3/uL (ref 150–400)
RBC: 3.78 MIL/uL — ABNORMAL LOW (ref 3.87–5.11)
RDW: 15.5 % (ref 11.5–15.5)
WBC: 5.7 10*3/uL (ref 4.0–10.5)
nRBC: 0 % (ref 0.0–0.2)

## 2022-11-07 LAB — SEDIMENTATION RATE: Sed Rate: 45 mm/hr — ABNORMAL HIGH (ref 0–22)

## 2022-11-07 MED ORDER — DEXTROSE 5 % IV SOLN
1500.0000 mg | Freq: Once | INTRAVENOUS | Status: AC
  Administered 2022-11-07: 1500 mg via INTRAVENOUS
  Filled 2022-11-07: qty 75

## 2022-11-10 ENCOUNTER — Ambulatory Visit (INDEPENDENT_AMBULATORY_CARE_PROVIDER_SITE_OTHER): Payer: 59 | Admitting: Internal Medicine

## 2022-11-10 ENCOUNTER — Other Ambulatory Visit: Payer: Self-pay

## 2022-11-10 ENCOUNTER — Encounter: Payer: Self-pay | Admitting: Internal Medicine

## 2022-11-10 DIAGNOSIS — M7022 Olecranon bursitis, left elbow: Secondary | ICD-10-CM

## 2022-11-10 DIAGNOSIS — L02414 Cutaneous abscess of left upper limb: Secondary | ICD-10-CM

## 2022-11-10 NOTE — Progress Notes (Signed)
Virtual Visit via Telephone Note  I connected with Kathleen Le on 11/10/22 at 11:15 AM EST by telephone and verified that I am speaking with the correct person using two identifiers.  Location: Patient: at home Provider: in clinic   I discussed the limitations, risks, security and privacy concerns of performing an evaluation and management service by telephone and the availability of in person appointments. I also discussed with the patient that there may be a patient responsible charge related to this service. The patient expressed understanding and agreed to proceed.   History of Present Illness:   Kathleen Le is a 61yo F with poorly healing septic olecranon bursitis requiring repeat I X D. Most recently had on 10/27 and on 11/1 Culture negative deep tissue infection/early osteo from septic olecranon bursitis slow c/b wound dehiscence = getting repeat I x d and wound closure. She received IV oritavancin today which will provide gram positive coverage for 10 days and will schedule next infusion at infusion center on 11/07/2022. Plus will send in rx for levofloxacin '750mg'$  daily by mouth x 28 days to avoid getting picc line   She tolerated 2nd infusion --on 11/10  Followed up with Dr duda's office on 11/07 where wound VAC  removed and On examination left elbow the incision is approximated with sutures there is moderate serosanguineous drainage.  No significant edema erythema or warmth   Changes bandage daily, wound still alittle opn  Observations/Objective:   Assessment and Plan:  Send recent pictures to see how to compare to pictures from 11/08.Call if anything worsens. Will get a call for the next dalbavancin infusion; appears still draining serous fluid. Does not appear to be infected  Next infusion to schedule around 11/20-- dalbavancin (DALVANCE) 1,500 mg in dextrose 5 % 500 mL IVPB  Continue on levofloxacin through 11/28  Rtc in 2-3 wks   Follow Up Instructions:    I discussed  the assessment and treatment plan with the patient. The patient was provided an opportunity to ask questions and all were answered. The patient agreed with the plan and demonstrated an understanding of the instructions.   The patient was advised to call back or seek an in-person evaluation if the symptoms worsen or if the condition fails to improve as anticipated.  I provided 20 minutes of non-face-to-face time during this encounter.   Carlyle Basques, MD

## 2022-11-11 ENCOUNTER — Ambulatory Visit (INDEPENDENT_AMBULATORY_CARE_PROVIDER_SITE_OTHER): Payer: 59 | Admitting: Orthopedic Surgery

## 2022-11-11 ENCOUNTER — Other Ambulatory Visit: Payer: Self-pay | Admitting: Physician Assistant

## 2022-11-11 DIAGNOSIS — S51002D Unspecified open wound of left elbow, subsequent encounter: Secondary | ICD-10-CM

## 2022-11-13 ENCOUNTER — Encounter: Payer: Self-pay | Admitting: Orthopedic Surgery

## 2022-11-13 NOTE — Progress Notes (Signed)
Office Visit Note   Patient: Kathleen Le           Date of Birth: 1961-10-04           MRN: [id number] Visit Date: 11/11/2022              Requested by: Donella Stade, PA-C [address] [place],  [place] [address] PCP: Donella Stade, [place]  Chief Complaint  Patient presents with   Left Elbow - Routine Post Op    10/24/2022 and 10/29/2022 left elbow debridement with graft       HPI: Patient is a 61 year old woman status post left elbow debridement for infected olecranon bursitis with tissue graft.  Patient has infectious disease managing her antibiotic coverage.  She is 2 weeks out from surgery.  Patient is approximately 1 years status post her initial injury.  Assessment & Plan: Visit Diagnoses:  1. Elbow wound, left, subsequent encounter     Plan: Begin Dial soap cleansing dry dressing changes with an Ace wrap daily.  Continue with her antibiotics including Levaquin and IV injections.  Follow-Up Instructions: Return in about 1 week (around 11/18/2022).   Ortho Exam  Patient is alert, oriented, no adenopathy, well-dressed, normal affect, normal respiratory effort. Examination there is slight wound dehiscence of several millimeters.  There is no cellulitis there is a small amount of clear drainage no purulence.  Imaging: No results found.   Labs: Lab Results  Component Value Date   HGBA1C 7.3 (A) 05/06/2022   HGBA1C 8.0 (H) 02/03/2022   HGBA1C 7.0 (A) 11/15/2021   ESRSEDRATE 45 (H) 11/07/2022   ESRSEDRATE 32 (H) 10/24/2022   ESRSEDRATE 20 09/17/2022   CRP 0.7 11/07/2022   CRP <0.5 10/24/2022   REPTSTATUS 10/29/2022 FINAL 10/24/2022   GRAMSTAIN NO WBC SEEN NO ORGANISMS SEEN  10/24/2022   CULT  10/24/2022    No growth aerobically or anaerobically. Performed at [facility], [place] [address] [place], [place] [address]    LABORGA NO GROWTH 01/24/2015     Lab Results  Component Value Date   ALBUMIN 3.2 (L) 11/07/2022   ALBUMIN  3.8 08/26/2017   ALBUMIN 4.0 06/25/2016   PREALBUMIN 24 09/25/2022    No results found for: "MG" No results found for: "VD25OH"  Lab Results  Component Value Date   PREALBUMIN 24 09/25/2022      Latest Ref Rng & Units 11/07/2022   12:30 PM 10/28/2022    5:44 AM 10/24/2022    6:02 AM  CBC EXTENDED  WBC 4.0 - 10.5 K/uL 5.7  4.8  5.7   RBC 3.87 - 5.11 MIL/uL 3.78  3.64  3.85   Hemoglobin 12.0 - 15.0 g/dL 11.0  10.8  11.3   HCT 36.0 - 46.0 % 35.2  33.1  34.9   Platelets 150 - 400 K/uL 348  303  326   NEUT# 1.7 - 7.7 K/uL  2.6    Lymph# 0.7 - 4.0 K/uL  1.4       There is no height or weight on file to calculate BMI.  Orders:  No orders of the defined types were placed in this encounter.  No orders of the defined types were placed in this encounter.    Procedures: No procedures performed  Clinical Data: No additional findings.  ROS:  All other systems negative, except as noted in the HPI. Review of Systems  Objective: Vital Signs: There were no vitals taken for this visit.  Specialty Comments:  No specialty comments available.  PMFS History: Patient Active Problem List   Diagnosis Date Noted   Septic bursitis of elbow 10/24/2022   COPD exacerbation ([place]) 09/02/2022   Bursitis of elbow 05/28/2022   Elbow wound, left, initial encounter 05/11/2022   Centrilobular emphysema ([place]) 05/09/2022   Triceps tendon rupture, left, initial encounter 02/07/2022   Fall 12/31/2021   Contusion of left arm 12/31/2021   Acute left-sided low back pain without sciatica 12/31/2021   Neck pain 12/31/2021   Encounter for smoking cessation counseling 11/15/2021   Chronic obstructive pulmonary disease ([place]) 02/26/2021   Hyperlipidemia associated with type 2 diabetes mellitus ([place]) 11/05/2020   Non-restorative sleep 11/05/2020   Snoring 11/05/2020   Anterolisthesis 07/30/2020   Acute stress reaction 09/12/2018   Current smoker 06/08/2018   Diabetes mellitus, type II, insulin  dependent ([place]) 06/08/2018   Claustrophobia 06/08/2018   Primary insomnia 06/08/2018   Hot flashes 06/08/2018   Abnormal weight gain 06/08/2018   Lower leg edema 11/10/2017   Insomnia due to other mental disorder 10/27/2017   History of substance abuse ([place]) 10/27/2017   GAD (generalized anxiety disorder) 10/27/2017   Class 3 severe obesity due to excess calories with serious comorbidity and body mass index (BMI) of 40.0 to 44.9 in adult ([place]) 08/31/2017   Cerumen impaction 01/24/2015   Hyperlipidemia 01/24/2015   Gastroesophageal reflux disease with esophagitis 01/10/2015   Foot callus 10/03/2014   PTSD (post-traumatic stress disorder) 09/26/2014   Depression 05/19/2014   Anxiety 05/19/2014   Chronic bilateral low back pain with right-sided sciatica 05/19/2014   DDD (degenerative disc disease), lumbar 05/19/2014   Neuropathy 02/05/2014   IT band syndrome 02/05/2014   Sinus tachycardia 02/05/2014   Memory changes 02/05/2014   Word finding difficulty 02/05/2014   Cervical cancer ([place]) 02/05/2014   Type II diabetes mellitus, uncontrolled 02/03/2014   Unspecified asthma(493.90) 02/03/2014   Essential hypertension, benign 02/03/2014   Pulmonary emphysema ([place]) 07/26/2012   Past Medical History:  Diagnosis Date   Anxiety    Arthritis    Asthma    Cancer ([place])    uterine   COPD (chronic obstructive pulmonary disease) ([place])    Degenerative disc disease, lumbar    Diabetes mellitus without complication ([facility])    Type II   Dyspnea    with exertion due to COPD   GERD (gastroesophageal reflux disease)    History of alcohol abuse    20-30 years ago, patient was going through divorce but does not currently drink any alcohol   History of kidney stones    passed   History of marijuana use    "its been a long time since I last smoked because of my COPD"   History of ovarian cancer 1984   Hypertension    Peripheral neuropathy    Pneumonia    Substance abuse ([place])    patient  denies, states she has only ever used marijuana   Swelling of both lower extremities     Family History  Problem Relation Age of Onset   Cancer Paternal Grandmother    Diabetes Paternal Grandmother    Diabetes Paternal Grandfather    Heart attack Maternal Aunt    Cancer Maternal Uncle    Cancer Maternal Grandmother    Heart attack Mother    Cancer Mother    Stroke Mother    Diabetes Father    Heart attack Father    Diabetes Paternal Uncle    Diabetes Paternal Uncle  Past Surgical History:  Procedure Laterality Date   CERVICAL CONIZATION W/BX  1985   DILATION AND CURETTAGE OF UTERUS     ECTOPIC PREGNANCY SURGERY     patient stated she had to have tube removed   HARDWARE REMOVAL Left 09/17/2022   Procedure: LEFT ELBOW HARDWARE REMOVAL;  Surgeon: Leandrew Koyanagi, MD;  [facility];  Service: Orthopedics;  Laterality: Left;   I & D EXTREMITY Left 05/12/2022   Procedure: IRRIGATION AND DEBRIDEMENT LEFT ELBOW;  Surgeon: Leandrew Koyanagi, MD;  Location: [facility];  Service: Orthopedics;  Laterality: Left;   I & D EXTREMITY Left 07/18/2022   Procedure: LEFT ELBOW IRRIGATION AND DEBRIDEMENT EXTREMITY;  Surgeon: Leandrew Koyanagi, MD;  Location: [facility];  Service: Orthopedics;  Laterality: Left;   I & D EXTREMITY Left 10/24/2022   Procedure: LEFT ELBOW DEBRIDEMENT;  Surgeon: [name], MD;  Location: [facility];  Service: Orthopedics;  Laterality: Left;   I & D EXTREMITY Left 10/29/2022   Procedure: LEFT ELBOW DEBRIDEMENT;  Surgeon: [name], MD;  Location: [facility];  Service: Orthopedics;  Laterality: Left;   IRRIGATION AND DEBRIDEMENT ELBOW Left 09/17/2022   Procedure: LEFT IRRIGATION AND DEBRIDEMENT ELBOW;  Surgeon: Leandrew Koyanagi, MD;  [facility];  Service: Orthopedics;  Laterality: Left;   TONSILLECTOMY     TRICEPS TENDON REPAIR Left 02/07/2022   Procedure: left triceps repair;  Surgeon: Leandrew Koyanagi, MD;  Location: [facility];  Service: Orthopedics;  Laterality: Left;    Social History   Occupational History   Not on file  Tobacco Use   Smoking status: Every Day    Packs/day: 0.10    Years: 35.00    Total pack years: 3.50    Types: Cigarettes    Last attempt to quit: 08/22/2022    Years since quitting: 0.2    Passive exposure: Never   Smokeless tobacco: Never   Tobacco comments:    Smokes 2 cigarettes/day  Vaping Use   Vaping Use: Never used  Substance and Sexual Activity   Alcohol use: Not Currently   Drug use: Not Currently    Types: Marijuana    Comment: 10/23/22- has not used since 2019   Sexual activity: Yes    Birth control/protection: Post-menopausal

## 2022-11-16 ENCOUNTER — Other Ambulatory Visit: Payer: Self-pay | Admitting: Physician Assistant

## 2022-11-17 ENCOUNTER — Ambulatory Visit (INDEPENDENT_AMBULATORY_CARE_PROVIDER_SITE_OTHER): Payer: 59

## 2022-11-17 VITALS — BP 131/61 | HR 78 | Temp 98.4°F | Resp 18 | Ht 66.0 in | Wt 302.2 lb

## 2022-11-17 DIAGNOSIS — M71122 Other infective bursitis, left elbow: Secondary | ICD-10-CM | POA: Diagnosis not present

## 2022-11-17 MED ORDER — DIPHENHYDRAMINE HCL 50 MG/ML IJ SOLN: 50.0000 mg | Freq: Once | INTRAMUSCULAR | Status: DC | PRN

## 2022-11-17 MED ORDER — METHYLPREDNISOLONE SODIUM SUCC 125 MG IJ SOLR: 125.0000 mg | Freq: Once | INTRAMUSCULAR | Status: DC | PRN

## 2022-11-17 MED ORDER — SODIUM CHLORIDE 0.9 % IV SOLN: Freq: Once | INTRAVENOUS | Status: DC | PRN

## 2022-11-17 MED ORDER — FAMOTIDINE IN NACL 20-0.9 MG/50ML-% IV SOLN: 20.0000 mg | Freq: Once | INTRAVENOUS | Status: DC | PRN

## 2022-11-17 MED ORDER — ALBUTEROL SULFATE HFA 108 (90 BASE) MCG/ACT IN AERS: 2.0000 | INHALATION_SPRAY | Freq: Once | RESPIRATORY_TRACT | Status: DC | PRN

## 2022-11-17 MED ORDER — DEXTROSE 5 % IV SOLN
1500.0000 mg | Freq: Once | INTRAVENOUS | Status: AC
  Administered 2022-11-17: 1500 mg via INTRAVENOUS
  Filled 2022-11-17: qty 75

## 2022-11-17 MED ORDER — EPINEPHRINE 0.3 MG/0.3ML IJ SOAJ: 0.3000 mg | Freq: Once | INTRAMUSCULAR | Status: DC | PRN

## 2022-11-17 NOTE — Progress Notes (Signed)
Diagnosis:   Septic olecranon bursitis of left elbow    Provider:  Marshell Garfinkel MD  Procedure: Infusion  IV Type: Peripheral, IV Location: R Antecubital  Dalvance (Dalbavancin), Dose: 1500 mg  Infusion Start Time: 8682  Infusion Stop Time: 1331  Post Infusion IV Care: Peripheral IV Discontinued  Discharge: Condition: Good, Destination: Home . AVS provided to patient.   Performed by:  Adelina Mings, LPN

## 2022-11-18 ENCOUNTER — Ambulatory Visit (INDEPENDENT_AMBULATORY_CARE_PROVIDER_SITE_OTHER): Payer: 59 | Admitting: Orthopedic Surgery

## 2022-11-18 DIAGNOSIS — S51002D Unspecified open wound of left elbow, subsequent encounter: Secondary | ICD-10-CM

## 2022-11-21 ENCOUNTER — Encounter: Payer: Self-pay | Admitting: Orthopedic Surgery

## 2022-11-21 NOTE — Progress Notes (Signed)
Office Visit Note   Patient: Kathleen Le           Date of Birth: September 19, 1961           MRN: [id number] Visit Date: 11/18/2022              Requested by: Donella Stade, PA-C [place],  [place] [address] PCP: Donella Stade, [place]  Chief Complaint  Patient presents with   Left Elbow - Routine Post Op    10/29/2022 left elbow debridement kerecis graft       HPI: Patient is a 61 year old woman is 2 weeks status post debridement left elbow infection.  Application of Kerecis tissue graft there is small wound dehiscence and clear drainage.  Assessment & Plan: Visit Diagnoses:  1. Elbow wound, left, subsequent encounter     Plan: Patient will continue with Dial soap cleansing compressive dressings , she has completed her antibiotics.  Follow-Up Instructions: Return in about 2 weeks (around 12/02/2022).   Ortho Exam  Patient is alert, oriented, no adenopathy, well-dressed, normal affect, normal respiratory effort. Examination the incision is healing there is no cellulitis there is a small amount of clear drainage.  There is no fluctuation.  Patient will continue with Dial soap cleansing 4 x 4 dressing changes with an Ace wrap.  Imaging: No results found. No images are attached to the encounter.  Labs: Lab Results  Component Value Date   HGBA1C 7.3 (A) 05/06/2022   HGBA1C 8.0 (H) 02/03/2022   HGBA1C 7.0 (A) 11/15/2021   ESRSEDRATE 45 (H) 11/07/2022   ESRSEDRATE 32 (H) 10/24/2022   ESRSEDRATE 20 09/17/2022   CRP 0.7 11/07/2022   CRP <0.5 10/24/2022   REPTSTATUS 10/29/2022 FINAL 10/24/2022   GRAMSTAIN NO WBC SEEN NO ORGANISMS SEEN  10/24/2022   CULT  10/24/2022    No growth aerobically or anaerobically. Performed at [facility], [place] [address], [place], [place] [address]    LABORGA NO GROWTH 01/24/2015     Lab Results  Component Value Date   ALBUMIN 3.2 (L) 11/07/2022   ALBUMIN 3.8 08/26/2017   ALBUMIN 4.0 06/25/2016    PREALBUMIN 24 09/25/2022    No results found for: "MG" No results found for: "VD25OH"  Lab Results  Component Value Date   PREALBUMIN 24 09/25/2022      Latest Ref Rng & Units 11/07/2022   12:30 PM 10/28/2022    5:44 AM 10/24/2022    6:02 AM  CBC EXTENDED  WBC 4.0 - 10.5 K/uL 5.7  4.8  5.7   RBC 3.87 - 5.11 MIL/uL 3.78  3.64  3.85   Hemoglobin 12.0 - 15.0 g/dL 11.0  10.8  11.3   HCT 36.0 - 46.0 % 35.2  33.1  34.9   Platelets 150 - 400 K/uL 348  303  326   NEUT# 1.7 - 7.7 K/uL  2.6    Lymph# 0.7 - 4.0 K/uL  1.4       There is no height or weight on file to calculate BMI.  Orders:  No orders of the defined types were placed in this encounter.  No orders of the defined types were placed in this encounter.    Procedures: No procedures performed  Clinical Data: No additional findings.  ROS:  All other systems negative, except as noted in the HPI. Review of Systems  Objective: Vital Signs: There were no vitals taken for this visit.  Specialty Comments:  No  specialty comments available.  PMFS History: Patient Active Problem List   Diagnosis Date Noted   Septic bursitis of elbow 10/24/2022   COPD exacerbation ([place]) 09/02/2022   Bursitis of elbow 05/28/2022   Elbow wound, left, initial encounter 05/11/2022   Centrilobular emphysema ([place]) 05/09/2022   Triceps tendon rupture, left, initial encounter 02/07/2022   Fall 12/31/2021   Contusion of left arm 12/31/2021   Acute left-sided low back pain without sciatica 12/31/2021   Neck pain 12/31/2021   Encounter for smoking cessation counseling 11/15/2021   Chronic obstructive pulmonary disease ([place]) 02/26/2021   Hyperlipidemia associated with type 2 diabetes mellitus ([place]) 11/05/2020   Non-restorative sleep 11/05/2020   Snoring 11/05/2020   Anterolisthesis 07/30/2020   Acute stress reaction 09/12/2018   Current smoker 06/08/2018   Diabetes mellitus, type II, insulin dependent ([place]) 06/08/2018   Claustrophobia  06/08/2018   Primary insomnia 06/08/2018   Hot flashes 06/08/2018   Abnormal weight gain 06/08/2018   Lower leg edema 11/10/2017   Insomnia due to other mental disorder 10/27/2017   History of substance abuse ([place]) 10/27/2017   GAD (generalized anxiety disorder) 10/27/2017   Class 3 severe obesity due to excess calories with serious comorbidity and body mass index (BMI) of 40.0 to 44.9 in adult ([place]) 08/31/2017   Cerumen impaction 01/24/2015   Hyperlipidemia 01/24/2015   Gastroesophageal reflux disease with esophagitis 01/10/2015   Foot callus 10/03/2014   PTSD (post-traumatic stress disorder) 09/26/2014   Depression 05/19/2014   Anxiety 05/19/2014   Chronic bilateral low back pain with right-sided sciatica 05/19/2014   DDD (degenerative disc disease), lumbar 05/19/2014   Neuropathy 02/05/2014   IT band syndrome 02/05/2014   Sinus tachycardia 02/05/2014   Memory changes 02/05/2014   Word finding difficulty 02/05/2014   Cervical cancer ([place]) 02/05/2014   Type II diabetes mellitus, uncontrolled 02/03/2014   Unspecified asthma(493.90) 02/03/2014   Essential hypertension, benign 02/03/2014   Pulmonary emphysema ([place]) 07/26/2012   Past Medical History:  Diagnosis Date   Anxiety    Arthritis    Asthma    Cancer ([place])    uterine   COPD (chronic obstructive pulmonary disease) ([place])    Degenerative disc disease, lumbar    Diabetes mellitus without complication (HCC)    Type II   Dyspnea    with exertion due to COPD   GERD (gastroesophageal reflux disease)    History of alcohol abuse    20-30 years ago, patient was going through divorce but does not currently drink any alcohol   History of kidney stones    passed   History of marijuana use    "its been a long time since I last smoked because of my COPD"   History of ovarian cancer 1984   Hypertension    Peripheral neuropathy    Pneumonia    Substance abuse ([place])    patient denies, states she has only ever used marijuana    Swelling of both lower extremities     Family History  Problem Relation Age of Onset   Cancer Paternal Grandmother    Diabetes Paternal Grandmother    Diabetes Paternal Grandfather    Heart attack Maternal Aunt    Cancer Maternal Uncle    Cancer Maternal Grandmother    Heart attack Mother    Cancer Mother    Stroke Mother    Diabetes Father    Heart attack Father    Diabetes Paternal Uncle    Diabetes Paternal Uncle  Past Surgical History:  Procedure Laterality Date   CERVICAL CONIZATION W/BX  1985   DILATION AND CURETTAGE OF UTERUS     ECTOPIC PREGNANCY SURGERY     patient stated she had to have tube removed   HARDWARE REMOVAL Left 09/17/2022   Procedure: LEFT ELBOW HARDWARE REMOVAL;  Surgeon: Leandrew Koyanagi, MD;  Location: [facility];  Service: Orthopedics;  Laterality: Left;   I & D EXTREMITY Left 05/12/2022   Procedure: IRRIGATION AND DEBRIDEMENT LEFT ELBOW;  Surgeon: Leandrew Koyanagi, MD;  Location: [facility];  Service: Orthopedics;  Laterality: Left;   I & D EXTREMITY Left 07/18/2022   Procedure: LEFT ELBOW IRRIGATION AND DEBRIDEMENT EXTREMITY;  Surgeon: Leandrew Koyanagi, MD;  Location: [facility];  Service: Orthopedics;  Laterality: Left;   I & D EXTREMITY Left 10/24/2022   Procedure: LEFT ELBOW DEBRIDEMENT;  Surgeon: [name], MD;  Location: [facility];  Service: Orthopedics;  Laterality: Left;   I & D EXTREMITY Left 10/29/2022   Procedure: LEFT ELBOW DEBRIDEMENT;  Surgeon: [name], MD;  Location: [facility];  Service: Orthopedics;  Laterality: Left;   IRRIGATION AND DEBRIDEMENT ELBOW Left 09/17/2022   Procedure: LEFT IRRIGATION AND DEBRIDEMENT ELBOW;  Surgeon: Leandrew Koyanagi, MD;  [facility];  Service: Orthopedics;  Laterality: Left;   TONSILLECTOMY     TRICEPS TENDON REPAIR Left 02/07/2022   Procedure: left triceps repair;  Surgeon: Leandrew Koyanagi, MD;  Location: [facility];  Service: Orthopedics;  Laterality: Left;   Social History   Occupational History   Not  on file  Tobacco Use   Smoking status: Every Day    Packs/day: 0.10    Years: 35.00    Total pack years: 3.50    Types: Cigarettes    Last attempt to quit: 08/22/2022    Years since quitting: 0.2    Passive exposure: Never   Smokeless tobacco: Never   Tobacco comments:    Smokes 2 cigarettes/day  Vaping Use   Vaping Use: Never used  Substance and Sexual Activity   Alcohol use: Not Currently   Drug use: Not Currently    Types: Marijuana    Comment: 10/23/22- has not used since 2019   Sexual activity: Yes    Birth control/protection: Post-menopausal

## 2022-11-24 DIAGNOSIS — J9690 Respiratory failure, unspecified, unspecified whether with hypoxia or hypercapnia: Secondary | ICD-10-CM | POA: Diagnosis not present

## 2022-11-28 ENCOUNTER — Ambulatory Visit: Payer: 59 | Admitting: Physician Assistant

## 2022-11-29 LAB — HM DIABETES EYE EXAM

## 2022-12-02 ENCOUNTER — Encounter: Payer: Self-pay | Admitting: Orthopedic Surgery

## 2022-12-02 ENCOUNTER — Ambulatory Visit (INDEPENDENT_AMBULATORY_CARE_PROVIDER_SITE_OTHER): Payer: 59 | Admitting: Orthopedic Surgery

## 2022-12-02 DIAGNOSIS — S51002D Unspecified open wound of left elbow, subsequent encounter: Secondary | ICD-10-CM

## 2022-12-02 NOTE — Progress Notes (Signed)
Office Visit Note   Patient: Kathleen Le           Date of Birth: 12-May-1961           MRN: [id number] Visit Date: 12/02/2022              Requested by: Donella Stade, PA-C [place],  [place] [address] PCP: Donella Stade, [place]  Chief Complaint  Patient presents with   Left Elbow - Routine Post Op    10/29/2022 left elbow debridement kerecis graft       HPI: Patient is a 61 year old woman who is seen 4 weeks status post debridement left elbow infected bursitis application of tissue graft.  Patient has completed her antibiotics.  Assessment & Plan: Visit Diagnoses:  1. Elbow wound, left, subsequent encounter     Plan: Continue with Dial soap cleansing 4 x 4 dressing changes and Ace wrap daily.  Anticipate removing sutures at follow-up.  Follow-Up Instructions: Return in about 1 week (around 12/09/2022).   Ortho Exam  Patient is alert, oriented, no adenopathy, well-dressed, normal affect, normal respiratory effort. Examination the wound is gaped open approximately 3 mm and 5 cm in length.  There is fibrinous tissue along the wound bed.  Recommended Dial soap cleansing with gentle debridement of the fibrinous tissue 4 x 4's and an Ace wrap was applied.  Imaging: No results found.   Labs: Lab Results  Component Value Date   HGBA1C 7.3 (A) 05/06/2022   HGBA1C 8.0 (H) 02/03/2022   HGBA1C 7.0 (A) 11/15/2021   ESRSEDRATE 45 (H) 11/07/2022   ESRSEDRATE 32 (H) 10/24/2022   ESRSEDRATE 20 09/17/2022   CRP 0.7 11/07/2022   CRP <0.5 10/24/2022   REPTSTATUS 10/29/2022 FINAL 10/24/2022   GRAMSTAIN NO WBC SEEN NO ORGANISMS SEEN  10/24/2022   CULT  10/24/2022    No growth aerobically or anaerobically. Performed at [facility], [place] [address], [place], [place] [address]    LABORGA NO GROWTH 01/24/2015     Lab Results  Component Value Date   ALBUMIN 3.2 (L) 11/07/2022   ALBUMIN 3.8 08/26/2017   ALBUMIN 4.0 06/25/2016    PREALBUMIN 24 09/25/2022    No results found for: "MG" No results found for: "VD25OH"  Lab Results  Component Value Date   PREALBUMIN 24 09/25/2022      Latest Ref Rng & Units 11/07/2022   12:30 PM 10/28/2022    5:44 AM 10/24/2022    6:02 AM  CBC EXTENDED  WBC 4.0 - 10.5 K/uL 5.7  4.8  5.7   RBC 3.87 - 5.11 MIL/uL 3.78  3.64  3.85   Hemoglobin 12.0 - 15.0 g/dL 11.0  10.8  11.3   HCT 36.0 - 46.0 % 35.2  33.1  34.9   Platelets 150 - 400 K/uL 348  303  326   NEUT# 1.7 - 7.7 K/uL  2.6    Lymph# 0.7 - 4.0 K/uL  1.4       There is no height or weight on file to calculate BMI.  Orders:  No orders of the defined types were placed in this encounter.  No orders of the defined types were placed in this encounter.    Procedures: No procedures performed  Clinical Data: No additional findings.  ROS:  All other systems negative, except as noted in the HPI. Review of Systems  Objective: Vital Signs: There were no vitals taken for this visit.  Specialty  Comments:  No specialty comments available.  PMFS History: Patient Active Problem List   Diagnosis Date Noted   Septic bursitis of elbow 10/24/2022   COPD exacerbation ([place]) 09/02/2022   Bursitis of elbow 05/28/2022   Elbow wound, left, initial encounter 05/11/2022   Centrilobular emphysema ([place]) 05/09/2022   Triceps tendon rupture, left, initial encounter 02/07/2022   Fall 12/31/2021   Contusion of left arm 12/31/2021   Acute left-sided low back pain without sciatica 12/31/2021   Neck pain 12/31/2021   Encounter for smoking cessation counseling 11/15/2021   Chronic obstructive pulmonary disease ([place]) 02/26/2021   Hyperlipidemia associated with type 2 diabetes mellitus ([place]) 11/05/2020   Non-restorative sleep 11/05/2020   Snoring 11/05/2020   Anterolisthesis 07/30/2020   Acute stress reaction 09/12/2018   Current smoker 06/08/2018   Diabetes mellitus, type II, insulin dependent ([place]) 06/08/2018   Claustrophobia  06/08/2018   Primary insomnia 06/08/2018   Hot flashes 06/08/2018   Abnormal weight gain 06/08/2018   Lower leg edema 11/10/2017   Insomnia due to other mental disorder 10/27/2017   History of substance abuse ([place]) 10/27/2017   GAD (generalized anxiety disorder) 10/27/2017   Class 3 severe obesity due to excess calories with serious comorbidity and body mass index (BMI) of 40.0 to 44.9 in adult ([place]) 08/31/2017   Cerumen impaction 01/24/2015   Hyperlipidemia 01/24/2015   Gastroesophageal reflux disease with esophagitis 01/10/2015   Foot callus 10/03/2014   PTSD (post-traumatic stress disorder) 09/26/2014   Depression 05/19/2014   Anxiety 05/19/2014   Chronic bilateral low back pain with right-sided sciatica 05/19/2014   DDD (degenerative disc disease), lumbar 05/19/2014   Neuropathy 02/05/2014   IT band syndrome 02/05/2014   Sinus tachycardia 02/05/2014   Memory changes 02/05/2014   Word finding difficulty 02/05/2014   Cervical cancer ([place]) 02/05/2014   Type II diabetes mellitus, uncontrolled 02/03/2014   Unspecified asthma(493.90) 02/03/2014   Essential hypertension, benign 02/03/2014   Pulmonary emphysema ([place]) 07/26/2012   Past Medical History:  Diagnosis Date   Anxiety    Arthritis    Asthma    Cancer ([place])    uterine   COPD (chronic obstructive pulmonary disease) ([place])    Degenerative disc disease, lumbar    Diabetes mellitus without complication (HCC)    Type II   Dyspnea    with exertion due to COPD   GERD (gastroesophageal reflux disease)    History of alcohol abuse    20-30 years ago, patient was going through divorce but does not currently drink any alcohol   History of kidney stones    passed   History of marijuana use    "its been a long time since I last smoked because of my COPD"   History of ovarian cancer 1984   Hypertension    Peripheral neuropathy    Pneumonia    Substance abuse ([place])    patient denies, states she has only ever used marijuana    Swelling of both lower extremities     Family History  Problem Relation Age of Onset   Cancer Paternal Grandmother    Diabetes Paternal Grandmother    Diabetes Paternal Grandfather    Heart attack Maternal Aunt    Cancer Maternal Uncle    Cancer Maternal Grandmother    Heart attack Mother    Cancer Mother    Stroke Mother    Diabetes Father    Heart attack Father    Diabetes Paternal Uncle    Diabetes Paternal Uncle  Past Surgical History:  Procedure Laterality Date   CERVICAL CONIZATION W/BX  1985   DILATION AND CURETTAGE OF UTERUS     ECTOPIC PREGNANCY SURGERY     patient stated she had to have tube removed   HARDWARE REMOVAL Left 09/17/2022   Procedure: LEFT ELBOW HARDWARE REMOVAL;  Surgeon: Leandrew Koyanagi, MD;  [facility];  Service: Orthopedics;  Laterality: Left;   I & D EXTREMITY Left 05/12/2022   Procedure: IRRIGATION AND DEBRIDEMENT LEFT ELBOW;  Surgeon: Leandrew Koyanagi, MD;  Location: [facility];  Service: Orthopedics;  Laterality: Left;   I & D EXTREMITY Left 07/18/2022   Procedure: LEFT ELBOW IRRIGATION AND DEBRIDEMENT EXTREMITY;  Surgeon: Leandrew Koyanagi, MD;  Location: [facility];  Service: Orthopedics;  Laterality: Left;   I & D EXTREMITY Left 10/24/2022   Procedure: LEFT ELBOW DEBRIDEMENT;  Surgeon: [name], MD;  Location: [facility];  Service: Orthopedics;  Laterality: Left;   I & D EXTREMITY Left 10/29/2022   Procedure: LEFT ELBOW DEBRIDEMENT;  Surgeon: [name], MD;  Location: [facility];  Service: Orthopedics;  Laterality: Left;   IRRIGATION AND DEBRIDEMENT ELBOW Left 09/17/2022   Procedure: LEFT IRRIGATION AND DEBRIDEMENT ELBOW;  Surgeon: Leandrew Koyanagi, MD;  [facility];  Service: Orthopedics;  Laterality: Left;   TONSILLECTOMY     TRICEPS TENDON REPAIR Left 02/07/2022   Procedure: left triceps repair;  Surgeon: Leandrew Koyanagi, MD;  Location: [facility];  Service: Orthopedics;  Laterality: Left;   Social History   Occupational History   Not  on file  Tobacco Use   Smoking status: Every Day    Packs/day: 0.10    Years: 35.00    Total pack years: 3.50    Types: Cigarettes    Last attempt to quit: 08/22/2022    Years since quitting: 0.2    Passive exposure: Never   Smokeless tobacco: Never   Tobacco comments:    Smokes 2 cigarettes/day  Vaping Use   Vaping Use: Never used  Substance and Sexual Activity   Alcohol use: Not Currently   Drug use: Not Currently    Types: Marijuana    Comment: 10/23/22- has not used since 2019   Sexual activity: Yes    Birth control/protection: Post-menopausal

## 2022-12-03 ENCOUNTER — Other Ambulatory Visit: Payer: Self-pay

## 2022-12-03 ENCOUNTER — Encounter: Payer: Self-pay | Admitting: Internal Medicine

## 2022-12-03 MED ORDER — FLUCONAZOLE 150 MG PO TABS: 150.0000 mg | ORAL_TABLET | Freq: Once | ORAL | 1 refills | Status: AC

## 2022-12-03 NOTE — Telephone Encounter (Signed)
Per Dr. Baxter Flattery; Fluc '150mg'$  take once and repeat of symptoms continue in 3 days.#5 with 1 refill   Prescription sent. Patient made aware.  Leatrice Jewels, RMA

## 2022-12-07 ENCOUNTER — Other Ambulatory Visit: Payer: Self-pay | Admitting: Physician Assistant

## 2022-12-11 ENCOUNTER — Encounter: Payer: Self-pay | Admitting: Orthopedic Surgery

## 2022-12-11 ENCOUNTER — Ambulatory Visit (INDEPENDENT_AMBULATORY_CARE_PROVIDER_SITE_OTHER): Payer: 59 | Admitting: Orthopedic Surgery

## 2022-12-11 DIAGNOSIS — S51002D Unspecified open wound of left elbow, subsequent encounter: Secondary | ICD-10-CM
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
## 2022-12-12 ENCOUNTER — Encounter: Payer: Self-pay | Admitting: Orthopedic Surgery

## 2022-12-15 ENCOUNTER — Other Ambulatory Visit: Payer: Self-pay | Admitting: Physician Assistant

## 2022-12-15 ENCOUNTER — Encounter: Payer: Self-pay | Admitting: Orthopedic Surgery

## 2022-12-15 DIAGNOSIS — E119 Type 2 diabetes mellitus without complications: Secondary | ICD-10-CM

## 2022-12-15 DIAGNOSIS — I10 Essential (primary) hypertension: Secondary | ICD-10-CM

## 2022-12-15 DIAGNOSIS — E1165 Type 2 diabetes mellitus with hyperglycemia: Secondary | ICD-10-CM

## 2022-12-16 ENCOUNTER — Encounter (HOSPITAL_COMMUNITY): Payer: Self-pay | Admitting: Orthopedic Surgery

## 2022-12-16 ENCOUNTER — Other Ambulatory Visit: Payer: Self-pay

## 2022-12-16 MED ORDER — HYDROCHLOROTHIAZIDE 25 MG PO TABS: 25.0000 mg | ORAL_TABLET | Freq: Every day | ORAL | 0 refills | Status: DC

## 2022-12-16 MED ORDER — INSULIN NPH (HUMAN) (ISOPHANE) 100 UNIT/ML ~~LOC~~ SUSP: SUBCUTANEOUS | 11 refills | Status: DC

## 2022-12-16 NOTE — Progress Notes (Signed)
Spoke with pt for pre-op call. Pt has had several surgeries on her elbow and she states nothing has changed with her medical history. Pt is diabetic. Last A1C was 7.2 in August. She states her fasting blood sugar has been "all over the place" since the infections and antibiotics have been going on. Instructed pt not to her Glipizide tonight or in the AM. Instructed her to take 1/2 of her regular dose of Novolin N tonight (she will take 7 units) and in the AM (she will take 5 units). Instructed her to not take all of her oral diabetic medications. Instructed pt to check her blood sugar when she wakes up in the morning and every 2 hours until she leaves for the hospital. If blood sugar is 70 or below, treat with 1/2 cup of clear juice (apple or cranberry) and recheck blood sugar 15 minutes after drinking juice. If blood sugar continues to be 70 or below, call the Short Stay department and ask to speak to a nurse.  Shower instructions given to pt and she voiced understanding.

## 2022-12-17 ENCOUNTER — Encounter (HOSPITAL_COMMUNITY): Payer: Self-pay | Admitting: Orthopedic Surgery

## 2022-12-17 ENCOUNTER — Ambulatory Visit (HOSPITAL_BASED_OUTPATIENT_CLINIC_OR_DEPARTMENT_OTHER): Payer: 59 | Admitting: Anesthesiology

## 2022-12-17 ENCOUNTER — Ambulatory Visit (HOSPITAL_COMMUNITY)
Admission: RE | Admit: 2022-12-17 | Discharge: 2022-12-17 | Disposition: A | Discharge status: Home / Self Care | Payer: 59 | Attending: Orthopedic Surgery | Admitting: Orthopedic Surgery

## 2022-12-17 ENCOUNTER — Other Ambulatory Visit: Payer: Self-pay

## 2022-12-17 ENCOUNTER — Ambulatory Visit (HOSPITAL_COMMUNITY): Payer: 59 | Admitting: Anesthesiology

## 2022-12-17 ENCOUNTER — Encounter (HOSPITAL_COMMUNITY)
Admission: RE | Disposition: A | Discharge status: Home / Self Care | Payer: Self-pay | Source: Home / Self Care | Attending: Orthopedic Surgery

## 2022-12-17 DIAGNOSIS — F419 Anxiety disorder, unspecified: Secondary | ICD-10-CM | POA: Insufficient documentation

## 2022-12-17 DIAGNOSIS — R69 Illness, unspecified: Secondary | ICD-10-CM | POA: Diagnosis not present

## 2022-12-17 DIAGNOSIS — E119 Type 2 diabetes mellitus without complications: Secondary | ICD-10-CM | POA: Diagnosis not present

## 2022-12-17 DIAGNOSIS — F17210 Nicotine dependence, cigarettes, uncomplicated: Secondary | ICD-10-CM | POA: Insufficient documentation

## 2022-12-17 DIAGNOSIS — Z7984 Long term (current) use of oral hypoglycemic drugs: Secondary | ICD-10-CM | POA: Diagnosis not present

## 2022-12-17 DIAGNOSIS — Z794 Long term (current) use of insulin: Secondary | ICD-10-CM | POA: Insufficient documentation

## 2022-12-17 DIAGNOSIS — Z833 Family history of diabetes mellitus: Secondary | ICD-10-CM | POA: Diagnosis not present

## 2022-12-17 DIAGNOSIS — K219 Gastro-esophageal reflux disease without esophagitis: Secondary | ICD-10-CM | POA: Diagnosis not present

## 2022-12-17 DIAGNOSIS — Z6841 Body Mass Index (BMI) 40.0 and over, adult: Secondary | ICD-10-CM | POA: Insufficient documentation

## 2022-12-17 DIAGNOSIS — X58XXXA Exposure to other specified factors, initial encounter: Secondary | ICD-10-CM | POA: Diagnosis not present

## 2022-12-17 DIAGNOSIS — Z87891 Personal history of nicotine dependence: Secondary | ICD-10-CM

## 2022-12-17 DIAGNOSIS — R0602 Shortness of breath: Secondary | ICD-10-CM | POA: Insufficient documentation

## 2022-12-17 DIAGNOSIS — M1990 Unspecified osteoarthritis, unspecified site: Secondary | ICD-10-CM | POA: Insufficient documentation

## 2022-12-17 DIAGNOSIS — J449 Chronic obstructive pulmonary disease, unspecified: Secondary | ICD-10-CM

## 2022-12-17 DIAGNOSIS — F32A Depression, unspecified: Secondary | ICD-10-CM | POA: Diagnosis not present

## 2022-12-17 DIAGNOSIS — I10 Essential (primary) hypertension: Secondary | ICD-10-CM | POA: Diagnosis not present

## 2022-12-17 DIAGNOSIS — T8130XA Disruption of wound, unspecified, initial encounter: Secondary | ICD-10-CM

## 2022-12-17 DIAGNOSIS — T8131XA Disruption of external operation (surgical) wound, not elsewhere classified, initial encounter: Secondary | ICD-10-CM | POA: Insufficient documentation

## 2022-12-17 DIAGNOSIS — E1142 Type 2 diabetes mellitus with diabetic polyneuropathy: Secondary | ICD-10-CM | POA: Diagnosis not present

## 2022-12-17 DIAGNOSIS — M71122 Other infective bursitis, left elbow: Secondary | ICD-10-CM | POA: Insufficient documentation

## 2022-12-17 HISTORY — PX: APPLICATION OF WOUND VAC: SHX5189

## 2022-12-17 HISTORY — DX: Depression, unspecified: F32.A

## 2022-12-17 HISTORY — PX: I & D EXTREMITY: SHX5045

## 2022-12-17 LAB — CBC
HCT: 32.8 % — ABNORMAL LOW (ref 36.0–46.0)
Hemoglobin: 10.4 g/dL — ABNORMAL LOW (ref 12.0–15.0)
MCH: 28.3 pg (ref 26.0–34.0)
MCHC: 31.7 g/dL (ref 30.0–36.0)
MCV: 89.4 fL (ref 80.0–100.0)
Platelets: 289 10*3/uL (ref 150–400)
RBC: 3.67 MIL/uL — ABNORMAL LOW (ref 3.87–5.11)
RDW: 14.6 % (ref 11.5–15.5)
WBC: 6 10*3/uL (ref 4.0–10.5)
nRBC: 0 % (ref 0.0–0.2)

## 2022-12-17 LAB — BASIC METABOLIC PANEL
Anion gap: 10 (ref 5–15)
BUN: 18 mg/dL (ref 6–20)
CO2: 28 mmol/L (ref 22–32)
Calcium: 9.3 mg/dL (ref 8.9–10.3)
Chloride: 100 mmol/L (ref 98–111)
Creatinine, Ser: 0.83 mg/dL (ref 0.44–1.00)
GFR, Estimated: >60 mL/min (ref >60)
Glucose, Bld: 218 mg/dL — ABNORMAL HIGH (ref 70–99)
Potassium: 4 mmol/L (ref 3.5–5.1)
Sodium: 138 mmol/L (ref 135–145)

## 2022-12-17 LAB — GLUCOSE, CAPILLARY
Glucose-Capillary: 223 mg/dL — ABNORMAL HIGH (ref 70–99)
Glucose-Capillary: 208 mg/dL — ABNORMAL HIGH (ref 70–99)
Glucose-Capillary: 191 mg/dL — ABNORMAL HIGH (ref 70–99)

## 2022-12-17 SURGERY — IRRIGATION AND DEBRIDEMENT EXTREMITY
Anesthesia: General | Site: Elbow | Laterality: Left

## 2022-12-17 MED ORDER — DEXTROSE 5 % IV SOLN
INTRAVENOUS | Status: DC | PRN
  Administered 2022-12-17: 3 g via INTRAVENOUS

## 2022-12-17 MED ORDER — ORAL CARE MOUTH RINSE: 15.0000 mL | Freq: Once | OROMUCOSAL | Status: AC

## 2022-12-17 MED ORDER — MEPERIDINE HCL 25 MG/ML IJ SOLN: 6.2500 mg | INTRAMUSCULAR | Status: DC | PRN

## 2022-12-17 MED ORDER — 0.9 % SODIUM CHLORIDE (POUR BTL) OPTIME
TOPICAL | Status: DC | PRN
  Administered 2022-12-17: 1000 mL

## 2022-12-17 MED ORDER — FENTANYL CITRATE (PF) 250 MCG/5ML IJ SOLN
INTRAMUSCULAR | Status: DC | PRN
  Administered 2022-12-17: 100 ug via INTRAVENOUS

## 2022-12-17 MED ORDER — ONDANSETRON HCL 4 MG/2ML IJ SOLN: 4.0000 mg | Freq: Once | INTRAMUSCULAR | Status: DC | PRN

## 2022-12-17 MED ORDER — LIDOCAINE 2% (20 MG/ML) 5 ML SYRINGE
INTRAMUSCULAR | Status: AC
  Filled 2022-12-17: qty 5

## 2022-12-17 MED ORDER — LACTATED RINGERS IV SOLN: INTRAVENOUS | Status: DC

## 2022-12-17 MED ORDER — ACETAMINOPHEN 10 MG/ML IV SOLN
1000.0000 mg | Freq: Once | INTRAVENOUS | Status: AC
  Administered 2022-12-17: 1000 mg via INTRAVENOUS

## 2022-12-17 MED ORDER — CHLORHEXIDINE GLUCONATE 0.12 % MT SOLN
OROMUCOSAL | Status: AC
  Administered 2022-12-17: 15 mL via OROMUCOSAL
  Filled 2022-12-17: qty 15

## 2022-12-17 MED ORDER — ACETAMINOPHEN 325 MG PO TABS: 325.0000 mg | ORAL_TABLET | ORAL | Status: DC | PRN

## 2022-12-17 MED ORDER — CEFAZOLIN IN SODIUM CHLORIDE 3-0.9 GM/100ML-% IV SOLN: 3.0000 g | INTRAVENOUS | Status: DC

## 2022-12-17 MED ORDER — SODIUM CHLORIDE 0.9 % IR SOLN
Status: DC | PRN
  Administered 2022-12-17: 3000 mL

## 2022-12-17 MED ORDER — OXYCODONE HCL 5 MG/5ML PO SOLN: 5.0000 mg | Freq: Once | ORAL | Status: DC | PRN

## 2022-12-17 MED ORDER — HYDROCODONE-ACETAMINOPHEN 5-325 MG PO TABS: 1.0000 | ORAL_TABLET | ORAL | 0 refills | Status: DC | PRN

## 2022-12-17 MED ORDER — ONDANSETRON HCL 4 MG/2ML IJ SOLN
INTRAMUSCULAR | Status: AC
  Filled 2022-12-17: qty 2

## 2022-12-17 MED ORDER — ONDANSETRON HCL 4 MG/2ML IJ SOLN
INTRAMUSCULAR | Status: DC | PRN
  Administered 2022-12-17: 4 mg via INTRAVENOUS

## 2022-12-17 MED ORDER — CEFAZOLIN IN SODIUM CHLORIDE 3-0.9 GM/100ML-% IV SOLN
INTRAVENOUS | Status: AC
  Filled 2022-12-17: qty 100

## 2022-12-17 MED ORDER — CHLORHEXIDINE GLUCONATE 0.12 % MT SOLN: 15.0000 mL | Freq: Once | OROMUCOSAL | Status: AC

## 2022-12-17 MED ORDER — MIDAZOLAM HCL 2 MG/2ML IJ SOLN
INTRAMUSCULAR | Status: AC
  Filled 2022-12-17: qty 2

## 2022-12-17 MED ORDER — ACETAMINOPHEN 10 MG/ML IV SOLN
INTRAVENOUS | Status: AC
  Filled 2022-12-17: qty 100

## 2022-12-17 MED ORDER — LIDOCAINE 2% (20 MG/ML) 5 ML SYRINGE
INTRAMUSCULAR | Status: DC | PRN
  Administered 2022-12-17: 100 mg via INTRAVENOUS

## 2022-12-17 MED ORDER — FENTANYL CITRATE (PF) 100 MCG/2ML IJ SOLN
25.0000 ug | INTRAMUSCULAR | Status: DC | PRN
  Administered 2022-12-17: 50 ug via INTRAVENOUS
  Administered 2022-12-17: 25 ug via INTRAVENOUS

## 2022-12-17 MED ORDER — PROPOFOL 10 MG/ML IV BOLUS
INTRAVENOUS | Status: DC | PRN
  Administered 2022-12-17: 150 mg via INTRAVENOUS
  Administered 2022-12-17: 50 mg via INTRAVENOUS
  Administered 2022-12-17: 100 mg via INTRAVENOUS

## 2022-12-17 MED ORDER — PROPOFOL 10 MG/ML IV BOLUS
INTRAVENOUS | Status: AC
  Filled 2022-12-17: qty 20

## 2022-12-17 MED ORDER — LACTATED RINGERS IV SOLN: INTRAVENOUS | Status: DC | PRN

## 2022-12-17 MED ORDER — DEXAMETHASONE SODIUM PHOSPHATE 10 MG/ML IJ SOLN
INTRAMUSCULAR | Status: AC
  Filled 2022-12-17: qty 1

## 2022-12-17 MED ORDER — ACETAMINOPHEN 160 MG/5ML PO SOLN: 325.0000 mg | ORAL | Status: DC | PRN

## 2022-12-17 MED ORDER — OXYCODONE HCL 5 MG PO TABS: 5.0000 mg | ORAL_TABLET | Freq: Once | ORAL | Status: DC | PRN

## 2022-12-17 MED ORDER — MIDAZOLAM HCL 2 MG/2ML IJ SOLN
INTRAMUSCULAR | Status: DC | PRN
  Administered 2022-12-17: 2 mg via INTRAVENOUS

## 2022-12-17 MED ORDER — FENTANYL CITRATE (PF) 250 MCG/5ML IJ SOLN
INTRAMUSCULAR | Status: AC
  Filled 2022-12-17: qty 5

## 2022-12-17 MED ORDER — FENTANYL CITRATE (PF) 100 MCG/2ML IJ SOLN
INTRAMUSCULAR | Status: AC
  Filled 2022-12-17: qty 2

## 2022-12-17 SURGICAL SUPPLY — 30 items
BAG COUNTER SPONGE SURGICOUNT (BAG) IMPLANT
BLADE SURG 21 STRL SS (BLADE) ×1 IMPLANT
BNDG COHESIVE 6X5 TAN STRL LF (GAUZE/BANDAGES/DRESSINGS) IMPLANT
BNDG GAUZE DERMACEA FLUFF 4 (GAUZE/BANDAGES/DRESSINGS) ×2 IMPLANT
COVER SURGICAL LIGHT HANDLE (MISCELLANEOUS) ×2 IMPLANT
DRAPE U-SHAPE 47X51 STRL (DRAPES) ×1 IMPLANT
DRSG ADAPTIC 3X8 NADH LF (GAUZE/BANDAGES/DRESSINGS) ×1 IMPLANT
DURAPREP 26ML APPLICATOR (WOUND CARE) ×1 IMPLANT
ELECT REM PT RETURN 9FT ADLT (ELECTROSURGICAL) ×1
ELECTRODE REM PT RTRN 9FT ADLT (ELECTROSURGICAL) IMPLANT
GAUZE SPONGE 4X4 12PLY STRL (GAUZE/BANDAGES/DRESSINGS) ×1 IMPLANT
GLOVE BIOGEL PI IND STRL 9 (GLOVE) ×1 IMPLANT
GLOVE SURG ORTHO 9.0 STRL STRW (GLOVE) ×1 IMPLANT
GOWN STRL REUS W/ TWL XL LVL3 (GOWN DISPOSABLE) ×2 IMPLANT
GOWN STRL REUS W/TWL XL LVL3 (GOWN DISPOSABLE) ×2
GRAFT SKIN WND MICRO 38 (Tissue) IMPLANT
HANDPIECE INTERPULSE COAX TIP (DISPOSABLE) ×1
KIT BASIN OR (CUSTOM PROCEDURE TRAY) ×1 IMPLANT
KIT TURNOVER KIT B (KITS) ×1 IMPLANT
MANIFOLD NEPTUNE II (INSTRUMENTS) ×1 IMPLANT
NS IRRIG 1000ML POUR BTL (IV SOLUTION) ×1 IMPLANT
PACK ORTHO EXTREMITY (CUSTOM PROCEDURE TRAY) ×1 IMPLANT
PAD ARMBOARD 7.5X6 YLW CONV (MISCELLANEOUS) ×2 IMPLANT
SET HNDPC FAN SPRY TIP SCT (DISPOSABLE) IMPLANT
STOCKINETTE IMPERVIOUS 9X36 MD (GAUZE/BANDAGES/DRESSINGS) IMPLANT
SUT ETHILON 2 0 PSLX (SUTURE) ×1 IMPLANT
SWAB COLLECTION DEVICE MRSA (MISCELLANEOUS) ×1 IMPLANT
TOWEL GREEN STERILE (TOWEL DISPOSABLE) ×1 IMPLANT
TUBE CONNECTING 12X1/4 (SUCTIONS) ×1 IMPLANT
YANKAUER SUCT BULB TIP NO VENT (SUCTIONS) ×1 IMPLANT

## 2022-12-17 NOTE — H&P (Signed)
Kathleen Le is an 61 y.o. female.   Chief Complaint: Dehiscence left elbow wound. HPI: Patient is a 61 year old woman who has had infected left olecranon bursitis as well as previous reconstruction of her triceps tendon.  Patient presents at this time with acute dehiscence of the wound with clear drainage.  Past Medical History:  Diagnosis Date   Anxiety    Arthritis    Asthma    Cancer ([place])    uterine   COPD (chronic obstructive pulmonary disease) (HCC)    Degenerative disc disease, lumbar    Depression    Diabetes mellitus without complication (HCC)    Type II   Dyspnea    with exertion due to COPD   GERD (gastroesophageal reflux disease)    History of alcohol abuse    20-30 years ago, patient was going through divorce but does not currently drink any alcohol   History of kidney stones    passed   History of marijuana use    "its been a long time since I last smoked because of my COPD"   History of ovarian cancer 1984   Hypertension    Peripheral neuropathy    Pneumonia    Substance abuse ([place])    patient denies, states she has only ever used marijuana   Swelling of both lower extremities     Past Surgical History:  Procedure Laterality Date   CERVICAL CONIZATION W/BX  [place]     patient stated she had to have tube removed   HARDWARE REMOVAL Left 09/17/2022   Procedure: LEFT ELBOW HARDWARE REMOVAL;  Surgeon: Leandrew Koyanagi, MD;  [facility];  Service: Orthopedics;  Laterality: Left;   I & D EXTREMITY Left 05/12/2022   Procedure: IRRIGATION AND DEBRIDEMENT LEFT ELBOW;  Surgeon: Leandrew Koyanagi, MD;  Location: [facility];  Service: Orthopedics;  Laterality: Left;   I & D EXTREMITY Left 07/18/2022   Procedure: LEFT ELBOW IRRIGATION AND DEBRIDEMENT EXTREMITY;  Surgeon: Leandrew Koyanagi, MD;  Location: [facility];  Service: Orthopedics;  Laterality: Left;   I & D EXTREMITY Left 10/24/2022    Procedure: LEFT ELBOW DEBRIDEMENT;  Surgeon: [name], MD;  Location: [facility];  Service: Orthopedics;  Laterality: Left;   I & D EXTREMITY Left 10/29/2022   Procedure: LEFT ELBOW DEBRIDEMENT;  Surgeon: [name], MD;  Location: [facility];  Service: Orthopedics;  Laterality: Left;   IRRIGATION AND DEBRIDEMENT ELBOW Left 09/17/2022   Procedure: LEFT IRRIGATION AND DEBRIDEMENT ELBOW;  Surgeon: Leandrew Koyanagi, MD;  [facility];  Service: Orthopedics;  Laterality: Left;   TONSILLECTOMY     TRICEPS TENDON REPAIR Left 02/07/2022   Procedure: left triceps repair;  Surgeon: Leandrew Koyanagi, MD;  Location: [facility];  Service: Orthopedics;  Laterality: Left;    Family History  Problem Relation Age of Onset   Cancer Paternal Grandmother    Diabetes Paternal Grandmother    Diabetes Paternal Grandfather    Heart attack Maternal Aunt    Cancer Maternal Uncle    Cancer Maternal Grandmother    Heart attack Mother    Cancer Mother    Stroke Mother    Diabetes Father    Heart attack Father    Diabetes Paternal Uncle    Diabetes Paternal Uncle    Social History:  reports that she has been smoking cigarettes. She has a 3.50 pack-year  smoking history. She has never been exposed to tobacco smoke. She has never used smokeless tobacco. She reports that she does not currently use alcohol. She reports that she does not currently use drugs after having used the following drugs: Marijuana.  Allergies:  Allergies  Allergen Reactions   Gabapentin Nausea And Vomiting   Invokana [Canagliflozin] Other (See Comments)    Vaginitis    Latex Itching and Rash   Lyrica [Pregabalin] Other (See Comments)    Pain in feet increased and spasm    Medications Prior to Admission  Medication Sig Dispense Refill   albuterol (PROVENTIL) (2.5 MG/3ML) 0.083% nebulizer solution USE 1 VIAL IN NEBULIZER EVERY 4 HOURS AS NEEDED FOR WHEEZING FOR SHORTNESS OF BREATH 270 mL 0   albuterol (VENTOLIN HFA) 108 (90 Base) MCG/ACT  inhaler INHALE 1 TO 2 PUFFS INTO LUNGS EVERY 6 HOURS AS NEEDED FOR WHEEZING FOR SHORTNESS OF BREATH 18 g 0   amLODipine (NORVASC) 5 MG tablet Take 1 tablet by mouth once daily 90 tablet 0   aspirin 81 MG EC tablet Take 81 mg by mouth daily.     atenolol (TENORMIN) 50 MG tablet Take 1 tablet (50 mg total) by mouth daily. 90 tablet 3   Calcium Carbonate (CALCARB 600 PO) Take 600 mg by mouth daily.     doxepin (SINEQUAN) 25 MG capsule TAKE 1 TO 2 CAPSULES BY MOUTH AT BEDTIME AS NEEDED FOR SLEEP (Patient taking differently: Take 50 mg by mouth at bedtime as needed (sleep/anxiety).) 180 capsule 0   DULoxetine (CYMBALTA) 30 MG capsule Take 1 capsule by mouth twice daily (Patient taking differently: Take 30 mg by mouth 2 (two) times daily.) 180 capsule 1   esomeprazole (NEXIUM) 20 MG capsule Take 20 mg by mouth daily.     Flaxseed, Linseed, (FLAX SEED OIL) 1000 MG CAPS Take 1,000 mg by mouth daily.     Fluticasone-Umeclidin-Vilant (TRELEGY ELLIPTA) 100-62.5-25 MCG/ACT AEPB Inhale 1 puff into the lungs daily. 1 each 11   furosemide (LASIX) 40 MG tablet Take 1 tablet (40 mg total) by mouth daily as needed. (Patient taking differently: Take 40 mg by mouth daily as needed for fluid or edema.) 30 tablet 0   glipiZIDE (GLUCOTROL) 10 MG tablet TAKE 1 TABLET BY MOUTH TWICE DAILY BEFORE A MEAL 180 tablet 0   hydrochlorothiazide (HYDRODIURIL) 25 MG tablet Take 1 tablet (25 mg total) by mouth daily. 90 tablet 0   insulin NPH Human (NOVOLIN N RELION) 100 UNIT/ML injection INJECT 10 UNITS SUBCUTANEOUSLY IN THE MORNING AND 15 AT BEDTIME 10 mL 11   ipratropium-albuterol (DUONEB) 0.5-2.5 (3) MG/3ML SOLN Take 3 mLs by nebulization every 6 (six) hours as needed. (Patient taking differently: Take 3 mLs by nebulization 2 (two) times daily as needed (wheezing/shortness of breath).) 360 mL 1   JANUVIA 100 MG tablet Take 1 tablet by mouth once daily 90 tablet 0   lisinopril (ZESTRIL) 5 MG tablet Take 1 tablet by mouth once daily  90 tablet 0   MAGNESIUM GLUCONATE PO Take 420 mg by mouth daily.     meloxicam (MOBIC) 15 MG tablet Take 1 tablet (15 mg total) by mouth daily. 90 tablet 0   metFORMIN (GLUCOPHAGE) 1000 MG tablet TAKE 1 TABLET BY MOUTH TWICE DAILY WITH MEALS 180 tablet 0   Multiple Vitamins-Minerals (MULTIVITAMIN WITH MINERALS) tablet Take 1 tablet by mouth daily. Woman's +     nystatin (MYCOSTATIN) 100000 UNIT/ML suspension Take 5 mLs (500,000 Units total) by mouth 4 (  four) times daily. (Patient taking differently: Take 5 mLs by mouth daily as needed (thrush).) 473 mL 0   nystatin (MYCOSTATIN/NYSTOP) powder Apply 1 application. topically 3 (three) times daily. (Patient taking differently: Apply 1 application  topically daily as needed (irritation).) 60 g 2   Omega-3 Fatty Acids (FISH OIL) 1000 MG CAPS Take 1,000 mg by mouth 2 (two) times daily.     OVER THE COUNTER MEDICATION Apply 1 application  topically at bedtime as needed (CBD Cream. Apply to feet).     pioglitazone (ACTOS) 45 MG tablet Take 1 tablet by mouth once daily 90 tablet 0   Probiotic Product (PROBIOTIC DAILY PO) Take 2 each by mouth at bedtime. Gummies     rosuvastatin (CRESTOR) 20 MG tablet Take 1 tablet by mouth once daily 90 tablet 0   Accu-Chek Softclix Lancets lancets USE 1  TO CHECK GLUCOSE UP TO 4 TIMES DAILY 100 each PRN   AMBULATORY NON FORMULARY MEDICATION Blood sugar testing strips and lancets for TrueTrack glucometer.  Use to check blood sugar up to two times a day.  Dx: Type 2 Diabetes 100 Units 4   AMBULATORY NON FORMULARY MEDICATION Please provide needles for Tresiba pens 100 each 3   Blood Glucose Monitoring Suppl (ACCU-CHEK GUIDE ME) w/Device KIT USE TO CHECK BLOOD SUGAR UP TO 4 TIMES DAILY AS DIRECTED 1 kit 0   glucose blood (ACCU-CHEK GUIDE) test strip Use 1 up to 4 times daily to check blood sugar 100 each 12   HYDROcodone-acetaminophen (NORCO/VICODIN) 5-325 MG tablet Take 1 tablet by mouth every 4 (four) hours as needed. (Patient  not taking: Reported on 11/10/2022) 30 tablet 0   Insulin Syringe-Needle U-100 (INSULIN SYRINGE .3CC/31GX5/16") 31G X 5/16" 0.3 ML MISC Dx DM E11.9. Inject insulin daily at bedtime. 10 each prn    Results for orders placed or performed during the hospital encounter of 12/17/22 (from the past 48 hour(s))  Glucose, capillary     Status: Abnormal   Collection Time: 12/17/22 10:56 AM  Result Value Ref Range   Glucose-Capillary 223 (H) 70 - 99 mg/dL    Comment: Glucose reference range applies only to samples taken after fasting for at least 8 hours.   No results found.  Review of Systems  Blood pressure 135/68, pulse 65, temperature 98.2 F (36.8 C), temperature source Oral, resp. rate 18, height _0  (1.676 m), weight 136.1 kg, SpO2 95 %. Physical Exam  Examination patient is alert oriented no adenopathy well-dressed normal affect normal respiratory effort.  She has dehiscence of the left elbow wound.  There is clear drainage there is no surrounding cellulitis no tenderness to palpation. Assessment/Plan Assessment: Left elbow wound dehiscence.  Plan: Will plan for repeat debridement left elbow placement of Kerecis tissue graft placement of a incisional wound VAC.  Risk and benefits were discussed including persistent infection.  Will obtain cultures.  [name], MD 12/17/2022, 11:53 AM

## 2022-12-17 NOTE — Transfer of Care (Signed)
Immediate Anesthesia Transfer of Care Note  Patient: Kathleen Le  Procedure(s) Performed: LEFT ELBOW DEBRIDEMENT (Left: Elbow) APPLICATION OF WOUND VAC (Left: Elbow)  Patient Location: PACU  Anesthesia Type:General  Level of Consciousness: drowsy and patient cooperative  Airway & Oxygen Therapy: Patient Spontanous Breathing and Patient connected to face mask oxygen  Post-op Assessment: Report given to RN and Post -op Vital signs reviewed and stable  Post vital signs: stable  Last Vitals:  Vitals Value Taken Time  BP 149/93 12/17/22 1354  Temp    Pulse 73 12/17/22 1357  Resp 15 12/17/22 1357  SpO2 99 % 12/17/22 1357  Vitals shown include unvalidated device data.  Last Pain:  Vitals:   12/17/22 1114  TempSrc: Oral  PainSc: 6          Complications: No notable events documented.

## 2022-12-17 NOTE — Op Note (Addendum)
12/17/2022  2:01 PM  PATIENT:  Kathleen Le    PRE-OPERATIVE DIAGNOSIS:  Dehiscence Left Elbow  POST-OPERATIVE DIAGNOSIS:  Same  PROCEDURE:  LEFT ELBOW DEBRIDEMENT, APPLICATION OF WOUND VAC Local tissue rearrangement for wound closure 4 x 10 cm. Excision of skin and soft tissue, bone, muscle and fascia of left olecranon bursal area. Application of Kerecis micro graft 38 cm to cover a wound surface area of 40 cm. Soft tissue and bone sent for cultures.  SURGEON:  [name], MD  PHYSICIAN ASSISTANT:None ANESTHESIA:   General  PREOPERATIVE INDICATIONS:  [name] is a  61 y.o. female with a diagnosis of Dehiscence Left Elbow who failed conservative measures and elected for surgical management.    The risks benefits and alternatives were discussed with the patient preoperatively including but not limited to the risks of infection, bleeding, nerve injury, cardiopulmonary complications, the need for revision surgery, among others, and the patient was willing to proceed.  OPERATIVE IMPLANTS: Kerecis micro graft 38 cm.  '@ENCIMAGES'$ @  OPERATIVE FINDINGS: No purulence.  Soft tissue and bone sent for cultures.  OPERATIVE PROCEDURE: Patient brought the operating room and underwent general anesthetic.  After adequate levels anesthesia obtained patient's left upper extremities prepped using DuraPrep draped into a sterile field a timeout was called.  Elliptical incision was made around the area of the wound dehiscence this left a wound that was 4 x 10 cm.  Blunt dissection was carried down to elevate subperiosteal.  Bone from the olecranon was sent for cultures as well as soft tissue there is no purulence.  The wound was irrigated with normal saline.  The wound bed was filled with 38 cm of Kerecis micro graft.  Local tissue rearrangement was used to close the wound 40 cm with 2-0 nylon.  A Prevena 13 cm wound VAC was applied this had a good suction fit this was covered with Coban patient  was extubated taken the PACU in stable condition.  A rondure was used to excise soft tissue, muscle and bone.  DISCHARGE PLANNING:  Antibiotic duration: Preoperative antibiotics  Weightbearing: Not applicable  Pain medication: Prescription for Vicodin  Dressing care/ Wound VAC: Continue wound VAC for 1 week  Ambulatory devices: Not applicable  Discharge to: Home.  Follow-up: In the office 1 week post operative.

## 2022-12-17 NOTE — Anesthesia Postprocedure Evaluation (Signed)
Anesthesia Post Note  Patient: Kathleen Le  Procedure(s) Performed: LEFT ELBOW DEBRIDEMENT (Left: Elbow) APPLICATION OF WOUND VAC (Left: Elbow)     Patient location during evaluation: PACU Anesthesia Type: General Level of consciousness: awake and alert Pain management: pain level controlled Vital Signs Assessment: post-procedure vital signs reviewed and stable Respiratory status: spontaneous breathing, nonlabored ventilation, respiratory function stable and patient connected to nasal cannula oxygen Cardiovascular status: blood pressure returned to baseline and stable Postop Assessment: no apparent nausea or vomiting Anesthetic complications: no  No notable events documented.  Last Vitals:  Vitals:   12/17/22 1122 12/17/22 1355  BP:  (!) 149/93  Pulse:  79  Resp:  15  Temp:  (!) 36.3 C  SpO2: 95% 95%    Last Pain:  Vitals:   12/17/22 1355  TempSrc:   PainSc: 7                  [place] Kirchner

## 2022-12-17 NOTE — Anesthesia Preprocedure Evaluation (Signed)
Anesthesia Evaluation  Patient identified by MRN, date of birth, ID band Patient awake    Reviewed: Allergy & Precautions, H&P , NPO status , Patient's Chart, lab work & pertinent test results  Airway Mallampati: II   Neck ROM: full    Dental   Pulmonary shortness of breath, asthma , pneumonia, COPD, Current Smoker and Patient abstained from smoking., former smoker   breath sounds clear to auscultation       Cardiovascular hypertension, Pt. on medications  Rhythm:regular Rate:Normal     Neuro/Psych  PSYCHIATRIC DISORDERS Anxiety Depression     Neuromuscular disease    GI/Hepatic ,GERD  Medicated,,  Endo/Other  diabetes, Type 2  Morbid obesity  Renal/GU      Musculoskeletal  (+) Arthritis ,    Abdominal   Peds  Hematology   Anesthesia Other Findings   Reproductive/Obstetrics                             Anesthesia Physical Anesthesia Plan  ASA: 3  Anesthesia Plan: General   Post-op Pain Management: Tylenol PO (pre-op)* and Toradol IV (intra-op)*   Induction: Intravenous  PONV Risk Score and Plan: 3 and Ondansetron, Dexamethasone, Midazolam and Treatment may vary due to age or medical condition  Airway Management Planned: LMA  Additional Equipment: None  Intra-op Plan:   Post-operative Plan: Extubation in OR  Informed Consent: I have reviewed the patients History and Physical, chart, labs and discussed the procedure including the risks, benefits and alternatives for the proposed anesthesia with the patient or authorized representative who has indicated his/her understanding and acceptance.       Plan Discussed with: CRNA, Anesthesiologist and Surgeon  Anesthesia Plan Comments: (PAT note written 10/23/2022 by Myra Gianotti, PA-C. )        Anesthesia Quick Evaluation

## 2022-12-17 NOTE — Progress Notes (Signed)
Dr.Oddono notified BS 223 and the patient took 5 units Nov[place] n NPH at 0900, he states we will live with that. Also notified him of patients o2 sat 85% on room air when patient arrived and she was nrevous. Pt calm now Oxygen applied 2 L/M [place] applied as she uses this at home. O2 sat on 2 l/m [place] 95% at this time. He states he will come by to see the pt.

## 2022-12-18 ENCOUNTER — Encounter (HOSPITAL_COMMUNITY): Payer: Self-pay | Admitting: Orthopedic Surgery

## 2022-12-19 LAB — ACID FAST CULTURE WITH REFLEXED SENSITIVITIES (MYCOBACTERIA): Acid Fast Culture: NEGATIVE

## 2022-12-21 ENCOUNTER — Other Ambulatory Visit: Payer: Self-pay | Admitting: Physician Assistant

## 2022-12-22 LAB — AEROBIC/ANAEROBIC CULTURE W GRAM STAIN (SURGICAL/DEEP WOUND): Gram Stain: NONE SEEN

## 2022-12-23 ENCOUNTER — Other Ambulatory Visit: Payer: Self-pay | Admitting: Physician Assistant

## 2022-12-23 DIAGNOSIS — F5101 Primary insomnia: Secondary | ICD-10-CM

## 2022-12-23 DIAGNOSIS — F40240 Claustrophobia: Secondary | ICD-10-CM

## 2022-12-24 ENCOUNTER — Encounter: Payer: 59 | Admitting: Family

## 2022-12-24 ENCOUNTER — Ambulatory Visit (INDEPENDENT_AMBULATORY_CARE_PROVIDER_SITE_OTHER): Payer: 59

## 2022-12-24 DIAGNOSIS — J9690 Respiratory failure, unspecified, unspecified whether with hypoxia or hypercapnia: Secondary | ICD-10-CM | POA: Diagnosis not present

## 2022-12-24 NOTE — Progress Notes (Signed)
Patient came in today for her first post op follow up. Wound vac was removed today in office. 1/4 of the canister full. Picture of incision was taken. Incision looked good and was intact. She will do dial soap cleansing and dry dressing changes daily. She will follow in office to see Dr. Sharol Given on 01/01/23.

## 2022-12-25 ENCOUNTER — Ambulatory Visit: Payer: 59 | Admitting: Physician Assistant

## 2022-12-26 ENCOUNTER — Other Ambulatory Visit: Payer: Self-pay | Admitting: Orthopedic Surgery

## 2022-12-26 MED ORDER — AMOXICILLIN-POT CLAVULANATE 875-125 MG PO TABS: 1.0000 | ORAL_TABLET | Freq: Two times a day (BID) | ORAL | 0 refills | Status: DC

## 2023-01-01 ENCOUNTER — Ambulatory Visit (INDEPENDENT_AMBULATORY_CARE_PROVIDER_SITE_OTHER): Payer: 59 | Admitting: Orthopedic Surgery

## 2023-01-01 DIAGNOSIS — S51002D Unspecified open wound of left elbow, subsequent encounter: Secondary | ICD-10-CM

## 2023-01-02 ENCOUNTER — Encounter: Payer: Self-pay | Admitting: Orthopedic Surgery

## 2023-01-02 NOTE — Progress Notes (Signed)
Office Visit Note   Patient: Kathleen Le           Date of Birth: 1961-06-27           MRN: [id number] Visit Date: 01/01/2023              Requested by: Donella Stade, PA-C [address],  [place] [address] PCP: Donella Stade, [place]  Chief Complaint  Patient presents with   Left Elbow - Routine Post Op      HPI: Patient is a 62 year old woman who is 2 weeks status post debridement left elbow recurrent wound olecranon bursitis.  She remove the wound VAC on December 27.  She is on Augmentin due to culture sensitivities.  Assessment & Plan: Visit Diagnoses:  1. Elbow wound, left, subsequent encounter     Plan: Continue with routine wound care and compression.  Follow-Up Instructions: Return in about 2 weeks (around 01/15/2023).   Ortho Exam  Patient is alert, oriented, no adenopathy, well-dressed, normal affect, normal respiratory effort. Examination there is no cellulitis odor or drainage.  The wound is well-approximated.  Patient is on nasal cannula FiO2.  Imaging: No results found. No images are attached to the encounter.  Labs: Lab Results  Component Value Date   HGBA1C 7.3 (A) 05/06/2022   HGBA1C 8.0 (H) 02/03/2022   HGBA1C 7.0 (A) 11/15/2021   ESRSEDRATE 45 (H) 11/07/2022   ESRSEDRATE 32 (H) 10/24/2022   ESRSEDRATE 20 09/17/2022   CRP 0.7 11/07/2022   CRP <0.5 10/24/2022   REPTSTATUS 12/22/2022 FINAL 12/17/2022   GRAMSTAIN NO WBC SEEN NO ORGANISMS SEEN  12/17/2022   CULT  12/17/2022    RARE DIPHTHEROIDS(CORYNEBACTERIUM SPECIES) Standardized susceptibility testing for this organism is not available. NO ANAEROBES ISOLATED Performed at [facility], [place] [address] [place], [place] [address]    LABORGA NO GROWTH 01/24/2015     Lab Results  Component Value Date   ALBUMIN 3.2 (L) 11/07/2022   ALBUMIN 3.8 08/26/2017   ALBUMIN 4.0 06/25/2016   PREALBUMIN 24 09/25/2022    No results found for: "MG" No results  found for: "VD25OH"  Lab Results  Component Value Date   PREALBUMIN 24 09/25/2022      Latest Ref Rng & Units 12/17/2022   11:35 AM 11/07/2022   12:30 PM 10/28/2022    5:44 AM  CBC EXTENDED  WBC 4.0 - 10.5 K/uL 6.0  5.7  4.8   RBC 3.87 - 5.11 MIL/uL 3.67  3.78  3.64   Hemoglobin 12.0 - 15.0 g/dL 10.4  11.0  10.8   HCT 36.0 - 46.0 % 32.8  35.2  33.1   Platelets 150 - 400 K/uL 289  348  303   NEUT# 1.7 - 7.7 K/uL   2.6   Lymph# 0.7 - 4.0 K/uL   1.4      There is no height or weight on file to calculate BMI.  Orders:  No orders of the defined types were placed in this encounter.  No orders of the defined types were placed in this encounter.    Procedures: No procedures performed  Clinical Data: No additional findings.  ROS:  All other systems negative, except as noted in the HPI. Review of Systems  Objective: Vital Signs: There were no vitals taken for this visit.  Specialty Comments:  No specialty comments available.  PMFS History: Patient Active Problem List   Diagnosis Date Noted   Septic bursitis of  elbow 10/24/2022   COPD exacerbation ([place]) 09/02/2022   Bursitis of elbow 05/28/2022   Elbow wound, left, initial encounter 05/11/2022   Centrilobular emphysema ([place]) 05/09/2022   Triceps tendon rupture, left, initial encounter 02/07/2022   Fall 12/31/2021   Contusion of left arm 12/31/2021   Acute left-sided low back pain without sciatica 12/31/2021   Neck pain 12/31/2021   Encounter for smoking cessation counseling 11/15/2021   Chronic obstructive pulmonary disease ([place]) 02/26/2021   Hyperlipidemia associated with type 2 diabetes mellitus ([place]) 11/05/2020   Non-restorative sleep 11/05/2020   Snoring 11/05/2020   Anterolisthesis 07/30/2020   Acute stress reaction 09/12/2018   Current smoker 06/08/2018   Diabetes mellitus, type II, insulin dependent ([place]) 06/08/2018   Claustrophobia 06/08/2018   Primary insomnia 06/08/2018   Hot flashes 06/08/2018    Abnormal weight gain 06/08/2018   Lower leg edema 11/10/2017   Insomnia due to other mental disorder 10/27/2017   History of substance abuse ([place]) 10/27/2017   GAD (generalized anxiety disorder) 10/27/2017   Class 3 severe obesity due to excess calories with serious comorbidity and body mass index (BMI) of 40.0 to 44.9 in adult ([place]) 08/31/2017   Cerumen impaction 01/24/2015   Hyperlipidemia 01/24/2015   Gastroesophageal reflux disease with esophagitis 01/10/2015   Foot callus 10/03/2014   PTSD (post-traumatic stress disorder) 09/26/2014   Depression 05/19/2014   Anxiety 05/19/2014   Chronic bilateral low back pain with right-sided sciatica 05/19/2014   DDD (degenerative disc disease), lumbar 05/19/2014   Neuropathy 02/05/2014   IT band syndrome 02/05/2014   Sinus tachycardia 02/05/2014   Memory changes 02/05/2014   Word finding difficulty 02/05/2014   Cervical cancer ([place]) 02/05/2014   Type II diabetes mellitus, uncontrolled 02/03/2014   Unspecified asthma(493.90) 02/03/2014   Essential hypertension, benign 02/03/2014   Pulmonary emphysema ([place]) 07/26/2012   Past Medical History:  Diagnosis Date   Anxiety    Arthritis    Asthma    Cancer ([place])    uterine   COPD (chronic obstructive pulmonary disease) ([place])    Degenerative disc disease, lumbar    Depression    Diabetes mellitus without complication (HCC)    Type II   Dyspnea    with exertion due to COPD   GERD (gastroesophageal reflux disease)    History of alcohol abuse    20-30 years ago, patient was going through divorce but does not currently drink any alcohol   History of kidney stones    passed   History of marijuana use    "its been a long time since I last smoked because of my COPD"   History of ovarian cancer 1984   Hypertension    Peripheral neuropathy    Pneumonia    Substance abuse ([place])    patient denies, states she has only ever used marijuana   Swelling of both lower extremities     Family History   Problem Relation Age of Onset   Cancer Paternal Grandmother    Diabetes Paternal Grandmother    Diabetes Paternal Grandfather    Heart attack Maternal Aunt    Cancer Maternal Uncle    Cancer Maternal Grandmother    Heart attack Mother    Cancer Mother    Stroke Mother    Diabetes Father    Heart attack Father    Diabetes Paternal Uncle    Diabetes Paternal Uncle     Past Surgical History:  Procedure Laterality Date   APPLICATION OF WOUND VAC Left 12/17/2022  Procedure: APPLICATION OF WOUND VAC;  Surgeon: [name], MD;  Location: [facility];  Service: Orthopedics;  Laterality: Left;   CERVICAL CONIZATION W/BX  1985   DILATION AND CURETTAGE OF UTERUS     ECTOPIC PREGNANCY SURGERY     patient stated she had to have tube removed   HARDWARE REMOVAL Left 09/17/2022   Procedure: LEFT ELBOW HARDWARE REMOVAL;  Surgeon: Leandrew Koyanagi, MD;  Location: [facility];  Service: Orthopedics;  Laterality: Left;   I & D EXTREMITY Left 05/12/2022   Procedure: IRRIGATION AND DEBRIDEMENT LEFT ELBOW;  Surgeon: Leandrew Koyanagi, MD;  Location: [facility];  Service: Orthopedics;  Laterality: Left;   I & D EXTREMITY Left 07/18/2022   Procedure: LEFT ELBOW IRRIGATION AND DEBRIDEMENT EXTREMITY;  Surgeon: Leandrew Koyanagi, MD;  Location: [facility];  Service: Orthopedics;  Laterality: Left;   I & D EXTREMITY Left 10/24/2022   Procedure: LEFT ELBOW DEBRIDEMENT;  Surgeon: [name], MD;  Location: [facility];  Service: Orthopedics;  Laterality: Left;   I & D EXTREMITY Left 10/29/2022   Procedure: LEFT ELBOW DEBRIDEMENT;  Surgeon: [name], MD;  Location: [facility];  Service: Orthopedics;  Laterality: Left;   I & D EXTREMITY Left 12/17/2022   Procedure: LEFT ELBOW DEBRIDEMENT;  Surgeon: [name], MD;  Location: [facility];  Service: Orthopedics;  Laterality: Left;   IRRIGATION AND DEBRIDEMENT ELBOW Left 09/17/2022   Procedure: LEFT IRRIGATION AND DEBRIDEMENT ELBOW;  Surgeon: Leandrew Koyanagi, MD;  Location: [facility];  Service: Orthopedics;  Laterality: Left;   TONSILLECTOMY     TRICEPS TENDON REPAIR Left 02/07/2022   Procedure: left triceps repair;  Surgeon: Leandrew Koyanagi, MD;  Location: [facility];  Service: Orthopedics;  Laterality: Left;   Social History   Occupational History   Not on file  Tobacco Use   Smoking status: Some Days    Packs/day: 0.10    Years: 35.00    Total pack years: 3.50    Types: Cigarettes    Passive exposure: Never   Smokeless tobacco: Never   Tobacco comments:    Smokes 2 cigarettes/day  Vaping Use   Vaping Use: Never used  Substance and Sexual Activity   Alcohol use: Not Currently   Drug use: Not Currently    Types: Marijuana    Comment: 10/23/22- has not used since 2019   Sexual activity: Yes    Birth control/protection: Post-menopausal

## 2023-01-05 ENCOUNTER — Encounter: Payer: Self-pay | Admitting: Internal Medicine

## 2023-01-06 ENCOUNTER — Encounter: Payer: Self-pay | Admitting: Internal Medicine

## 2023-01-06 ENCOUNTER — Ambulatory Visit: Payer: 59 | Admitting: Primary Care

## 2023-01-09 ENCOUNTER — Other Ambulatory Visit: Payer: Self-pay | Admitting: Physician Assistant

## 2023-01-09 DIAGNOSIS — E119 Type 2 diabetes mellitus without complications: Secondary | ICD-10-CM

## 2023-01-09 DIAGNOSIS — E1165 Type 2 diabetes mellitus with hyperglycemia: Secondary | ICD-10-CM

## 2023-01-11 ENCOUNTER — Other Ambulatory Visit: Payer: Self-pay | Admitting: Physician Assistant

## 2023-01-16 ENCOUNTER — Other Ambulatory Visit: Payer: Self-pay

## 2023-01-16 ENCOUNTER — Encounter: Payer: Self-pay | Admitting: Adult Health

## 2023-01-16 ENCOUNTER — Ambulatory Visit: Payer: 59 | Admitting: Adult Health

## 2023-01-16 ENCOUNTER — Other Ambulatory Visit: Payer: Self-pay | Admitting: Physician Assistant

## 2023-01-16 VITALS — BP 130/60 | HR 85 | Ht 66.0 in | Wt 298.6 lb

## 2023-01-16 DIAGNOSIS — J9611 Chronic respiratory failure with hypoxia: Secondary | ICD-10-CM | POA: Insufficient documentation

## 2023-01-16 DIAGNOSIS — Z720 Tobacco use: Secondary | ICD-10-CM

## 2023-01-16 DIAGNOSIS — R0683 Snoring: Secondary | ICD-10-CM

## 2023-01-16 DIAGNOSIS — J432 Centrilobular emphysema: Secondary | ICD-10-CM

## 2023-01-16 DIAGNOSIS — Z87891 Personal history of nicotine dependence: Secondary | ICD-10-CM

## 2023-01-16 DIAGNOSIS — F5101 Primary insomnia: Secondary | ICD-10-CM

## 2023-01-16 DIAGNOSIS — F40240 Claustrophobia: Secondary | ICD-10-CM

## 2023-01-16 HISTORY — DX: Tobacco use: Z72.0

## 2023-01-16 HISTORY — DX: Chronic respiratory failure with hypoxia: J96.11

## 2023-01-16 NOTE — Progress Notes (Signed)
$'@Patient'I$  ID: Kathleen Le, female    DOB: 1961/09/16, 62 y.o.   MRN: [id number]  Chief Complaint  Patient presents with   Follow-up    Referring provider: Lavada Le  HPI: 62 year old female smoker  seen for pulmonary consult October 06, 2022 to establish for COPD and chronic respiratory failure  TEST/EVENTS :   01/16/2023 Follow up: COPD , O2 RF  Patient presents for a 58-monthfollow-up.  Patient was seen last visit for pulmonary consult to establish for COPD and chronic respiratory failure on home oxygen.  Patient was recommended on smoking cessation.  She was set up for PFTs and a home sleep study for suspected underlying sleep apnea.  She was recommended to take Trelegy inhaler daily.  Patient unfortunately has not gotten her PFTs or home sleep study.  She does remain on Trelegy daily.  We discussed the lung cancer screening referral program.  Patient has a 35-pack-year history patient agrees with referral. Patient remains on oxygen 2 L with activity. She denies any hemoptysis, chest pain, orthopnea. Gets winded with heavy activities.  Is limited by joint pain.  Patient says she is not very active.  Allergies  Allergen Reactions   Gabapentin Nausea And Vomiting   Invokana [Canagliflozin] Other (See Comments)    Vaginitis    Latex Itching and Rash   Lyrica [Pregabalin] Other (See Comments)    Pain in feet increased and spasm    Immunization History  Administered Date(s) Administered   Pneumococcal Polysaccharide-23 02/03/2014   Tdap 02/03/2014    Past Medical History:  Diagnosis Date   Anxiety    Arthritis    Asthma    Cancer ([place]    uterine   COPD (chronic obstructive pulmonary disease) (HCC)    Degenerative disc disease, lumbar    Depression    Diabetes mellitus without complication (HCC)    Type II   Dyspnea    with exertion due to COPD   GERD (gastroesophageal reflux disease)    History of alcohol abuse    20-30 years ago, patient was going  through divorce but does not currently drink any alcohol   History of kidney stones    passed   History of marijuana use    "its been a long time since I last smoked because of my COPD"   History of ovarian cancer 1984   Hypertension    Peripheral neuropathy    Pneumonia    Substance abuse ([place]    patient denies, states she has only ever used marijuana   Swelling of both lower extremities     Tobacco History: Social History   Tobacco Use  Smoking Status Former   Packs/day: 0.10   Years: 35.00   Total pack years: 3.50   Types: Cigarettes   Quit date: 11/16/2022   Years since quitting: 0.1   Passive exposure: Never  Smokeless Tobacco Never  Tobacco Comments   Smokes 2 cigarettes/day   Counseling given: Not Answered Tobacco comments: Smokes 2 cigarettes/day   Outpatient Medications Prior to Visit  Medication Sig Dispense Refill   Accu-Chek Softclix Lancets lancets USE 1  TO CHECK GLUCOSE UP TO 4 TIMES DAILY 100 each PRN   albuterol (PROVENTIL) (2.5 MG/3ML) 0.083% nebulizer solution USE 1 VIAL IN NEBULIZER EVERY 4 HOURS AS NEEDED FOR WHEEZING FOR SHORTNESS OF BREATH 270 mL 0   albuterol (VENTOLIN HFA) 108 (90 Base) MCG/ACT inhaler INHALE 1 TO 2 PUFFS BY MOUTH EVERY 6 HOURS AS NEEDED FOR  WHEEZING FOR SHORTNESS OF BREATH 18 g 0   AMBULATORY NON FORMULARY MEDICATION Blood sugar testing strips and lancets for TrueTrack glucometer.  Use to check blood sugar up to two times a day.  Dx: Type 2 Diabetes 100 Units 4   AMBULATORY NON FORMULARY MEDICATION Please provide needles for Tresiba pens 100 each 3   amLODipine (NORVASC) 5 MG tablet Take 1 tablet by mouth once daily 90 tablet 0   amoxicillin-clavulanate (AUGMENTIN) 875-125 MG tablet Take 1 tablet by mouth 2 (two) times daily. 30 tablet 0   aspirin 81 MG EC tablet Take 81 mg by mouth daily.     atenolol (TENORMIN) 50 MG tablet Take 1 tablet (50 mg total) by mouth daily. 90 tablet 3   Blood Glucose Monitoring Suppl (ACCU-CHEK  GUIDE ME) w/Device KIT USE TO CHECK BLOOD SUGAR UP TO 4 TIMES DAILY AS DIRECTED 1 kit 0   Calcium Carbonate (CALCARB 600 PO) Take 600 mg by mouth daily.     doxepin (SINEQUAN) 25 MG capsule TAKE 1 TO 2 CAPSULES BY MOUTH AT BEDTIME AS NEEDED FOR SLEEP 60 capsule 0   DULoxetine (CYMBALTA) 30 MG capsule Take 1 capsule by mouth twice daily (Patient taking differently: Take 30 mg by mouth 2 (two) times daily.) 180 capsule 1   esomeprazole (NEXIUM) 20 MG capsule Take 20 mg by mouth daily.     Flaxseed, Linseed, (FLAX SEED OIL) 1000 MG CAPS Take 1,000 mg by mouth daily.     Fluticasone-Umeclidin-Vilant (TRELEGY ELLIPTA) 100-62.5-25 MCG/ACT AEPB Inhale 1 puff into the lungs daily. 1 each 11   furosemide (LASIX) 40 MG tablet Take 1 tablet (40 mg total) by mouth daily as needed. (Patient taking differently: Take 40 mg by mouth daily as needed for fluid or edema.) 30 tablet 0   glipiZIDE (GLUCOTROL) 10 MG tablet TAKE 1 TABLET BY MOUTH TWICE DAILY BEFORE A MEAL 180 tablet 0   glucose blood (ACCU-CHEK GUIDE) test strip Use 1 up to 4 times daily to check blood sugar 100 each 12   hydrochlorothiazide (HYDRODIURIL) 25 MG tablet Take 1 tablet (25 mg total) by mouth daily. 90 tablet 0   HYDROcodone-acetaminophen (NORCO/VICODIN) 5-325 MG tablet Take 1 tablet by mouth every 4 (four) hours as needed. 30 tablet 0   insulin NPH Human (NOVOLIN N RELION) 100 UNIT/ML injection INJECT 10 UNITS SUBCUTANEOUSLY IN THE MORNING AND 15 AT BEDTIME 10 mL 11   Insulin Syringe-Needle U-100 (INSULIN SYRINGE .3CC/31GX5/16") 31G X 5/16" 0.3 ML MISC Dx DM E11.9. Inject insulin daily at bedtime. 10 each prn   ipratropium-albuterol (DUONEB) 0.5-2.5 (3) MG/3ML SOLN Take 3 mLs by nebulization every 6 (six) hours as needed. (Patient taking differently: Take 3 mLs by nebulization 2 (two) times daily as needed (wheezing/shortness of breath).) 360 mL 1   JANUVIA 100 MG tablet Take 1 tablet by mouth once daily 90 tablet 0   lisinopril (ZESTRIL) 5 MG  tablet Take 1 tablet by mouth once daily 90 tablet 0   MAGNESIUM GLUCONATE PO Take 420 mg by mouth daily.     meloxicam (MOBIC) 15 MG tablet Take 1 tablet (15 mg total) by mouth daily. Needs appt 90 tablet 0   metFORMIN (GLUCOPHAGE) 1000 MG tablet Take 1 tablet (1,000 mg total) by mouth 2 (two) times daily with a meal. Needs appt 60 tablet 0   Multiple Vitamins-Minerals (MULTIVITAMIN WITH MINERALS) tablet Take 1 tablet by mouth daily. Woman's +     nystatin (MYCOSTATIN) 100000 UNIT/ML suspension  Take 5 mLs (500,000 Units total) by mouth 4 (four) times daily. (Patient taking differently: Take 5 mLs by mouth daily as needed (thrush).) 473 mL 0   nystatin (MYCOSTATIN/NYSTOP) powder Apply 1 application. topically 3 (three) times daily. (Patient taking differently: Apply 1 application  topically daily as needed (irritation).) 60 g 2   Omega-3 Fatty Acids (FISH OIL) 1000 MG CAPS Take 1,000 mg by mouth 2 (two) times daily.     OVER THE COUNTER MEDICATION Apply 1 application  topically at bedtime as needed (CBD Cream. Apply to feet).     pioglitazone (ACTOS) 45 MG tablet Take 1 tablet (45 mg total) by mouth daily. Needs appt 30 tablet 0   Probiotic Product (PROBIOTIC DAILY PO) Take 2 each by mouth at bedtime. Gummies     rosuvastatin (CRESTOR) 20 MG tablet Take 1 tablet by mouth once daily 90 tablet 0   No facility-administered medications prior to visit.     Review of Systems:   Constitutional:   No  weight loss, night sweats,  Fevers, chills, + fatigue, or  lassitude.  HEENT:   No headaches,  Difficulty swallowing,  Tooth/dental problems, or  Sore throat,                No sneezing, itching, ear ache, nasal congestion, post nasal drip,   CV:  No chest pain,  Orthopnea, PND, swelling in lower extremities, anasarca, dizziness, palpitations, syncope.   GI  No heartburn, indigestion, abdominal pain, nausea, vomiting, diarrhea, change in bowel habits, loss of appetite, bloody stools.   Resp:  No  chest wall deformity  Skin: no rash or lesions.  GU: no dysuria, change in color of urine, no urgency or frequency.  No flank pain, no hematuria   MS:  No joint pain or swelling.  No decreased range of motion.  No back pain.    Physical Exam  BP 130/60 (BP Location: Right Arm, Patient Position: Sitting, Cuff Size: Large)   Pulse 85   Ht '5\' 6"'$  (1.676 m)   Wt 298 lb 9.6 oz (135.4 kg)   SpO2 94%   BMI 48.20 kg/m   GEN: A/Ox3; pleasant , NAD, well nourished    HEENT:  [place]/AT,  EACs-clear, TMs-wnl, NOSE-clear, THROAT-clear, no lesions, no postnasal drip or exudate noted.   NECK:  Supple w/ fair ROM; no JVD; normal carotid impulses w/o bruits; no thyromegaly or nodules palpated; no lymphadenopathy.    RESP  Clear  P & A; w/o, wheezes/ rales/ or rhonchi. no accessory muscle use, no dullness to percussion  CARD:  RRR, no m/r/g, no peripheral edema, pulses intact, no cyanosis or clubbing.  GI:   Soft & nt; nml bowel sounds; no organomegaly or masses detected.   Musco: Warm bil, no deformities or joint swelling noted.   Neuro: alert, no focal deficits noted.    Skin: Warm, no lesions or rashes    Lab Results:     Patient   ProBNP No results found for: "PROBNP"  Imaging: No results found.        No data to display          No results found for: "NITRICOXIDE"      Assessment & Plan:   Chronic obstructive pulmonary disease ([place]) Suspected COPD.  Continue on Trelegy.  PFTs are pending Encouraged on smoking cessation  Plan . Patient Instructions  Continue on Trelegy 1 puff daily, rinse after use Albuterol inhaler As needed   Activity as tolerated Continue  on Oxygen 2 L with activity to keep O2 sats >88-90%.  Refer to the lung cancer CT screening program Please set up PFTs Home sleep study when available Follow-up in 3-4 months with Dr. Shearon Stalls and As needed      Tobacco abuse Smoking cessation discussed  Chronic respiratory failure with hypoxia  ([place]) Continue on oxygen to maintain O2 saturations greater than 88 to 90%.  Snoring Snoring, restless sleep, daytime sleepiness all suspicious for underlying sleep apnea.  Sleep study is pending.     Rexene Edison, NP 01/16/2023

## 2023-01-16 NOTE — Assessment & Plan Note (Signed)
Continue on oxygen to maintain O2 saturations greater than 88 to 90%. 

## 2023-01-16 NOTE — Assessment & Plan Note (Signed)
Suspected COPD.  Continue on Trelegy.  PFTs are pending Encouraged on smoking cessation  Plan . Patient Instructions  Continue on Trelegy 1 puff daily, rinse after use Albuterol inhaler As needed   Activity as tolerated Continue on Oxygen 2 L with activity to keep O2 sats >88-90%.  Refer to the lung cancer CT screening program Please set up PFTs Home sleep study when available Follow-up in 3-4 months with Dr. Shearon Stalls and As needed

## 2023-01-16 NOTE — Progress Notes (Signed)
Has not had the flu vaccine and declined today.  Has not had any of the covid vaccines.

## 2023-01-16 NOTE — Assessment & Plan Note (Signed)
Smoking cessation discussed 

## 2023-01-16 NOTE — Assessment & Plan Note (Signed)
Snoring, restless sleep, daytime sleepiness all suspicious for underlying sleep apnea.  Sleep study is pending.

## 2023-01-16 NOTE — Patient Instructions (Addendum)
Continue on Trelegy 1 puff daily, rinse after use Albuterol inhaler As needed   Activity as tolerated Continue on Oxygen 2 L with activity to keep O2 sats >88-90%.  Refer to the lung cancer CT screening program Please set up PFTs Home sleep study when available Follow-up in 3-4 months with Dr. Shearon Stalls and As needed

## 2023-01-20 ENCOUNTER — Encounter: Payer: Self-pay | Admitting: Orthopedic Surgery

## 2023-01-20 ENCOUNTER — Ambulatory Visit (INDEPENDENT_AMBULATORY_CARE_PROVIDER_SITE_OTHER): Payer: 59 | Admitting: Orthopedic Surgery

## 2023-01-20 DIAGNOSIS — S51002D Unspecified open wound of left elbow, subsequent encounter: Secondary | ICD-10-CM

## 2023-01-20 NOTE — Progress Notes (Signed)
Office Visit Note   Patient: Kathleen Le           Date of Birth: 28-Jun-1961           MRN: [id number] Visit Date: 01/20/2023              Requested by: Donella Stade, PA-C [facility] [place],  [place] [address] PCP: Donella Stade, [place]  Chief Complaint  Patient presents with   Left Elbow - Routine Post Op    12/17/2022 left elbow debridement kerecis graft 38 micro       HPI: Patient is a 62 year old woman who is 4 weeks status post left elbow debridement and application of Kerecis micro graft.  She has completed her antibiotics a few days ago.  She states she has a small amount of clear serosanguineous drainage.  She states she feels much better.  Patient states she recently stopped smoking she is off her oxygen and off her inhalers and has started a weight loss of program as well.  Assessment & Plan: Visit Diagnoses:  1. Elbow wound, left, subsequent encounter     Plan: Sutures harvested start Dial soap cleansing 4 x 4 gauze and an Ace wrap reevaluate in 4 weeks.  Follow-Up Instructions: Return in about 4 weeks (around 02/17/2023).   Ortho Exam  Patient is alert, oriented, no adenopathy, well-dressed, normal affect, normal respiratory effort. Examination there is no redness or cellulitis.  No purulent drainage no tenderness to palpation no signs of infection.  Sutures harvested.  Imaging: No results found.   Labs: Lab Results  Component Value Date   HGBA1C 7.3 (A) 05/06/2022   HGBA1C 8.0 (H) 02/03/2022   HGBA1C 7.0 (A) 11/15/2021   ESRSEDRATE 45 (H) 11/07/2022   ESRSEDRATE 32 (H) 10/24/2022   ESRSEDRATE 20 09/17/2022   CRP 0.7 11/07/2022   CRP <0.5 10/24/2022   REPTSTATUS 12/22/2022 FINAL 12/17/2022   GRAMSTAIN NO WBC SEEN NO ORGANISMS SEEN  12/17/2022   CULT  12/17/2022    RARE DIPHTHEROIDS(CORYNEBACTERIUM SPECIES) Standardized susceptibility testing for this organism is not available. NO ANAEROBES ISOLATED Performed at [facility], [place] [address], [place], [place] [address]    LABORGA NO GROWTH 01/24/2015     Lab Results  Component Value Date   ALBUMIN 3.2 (L) 11/07/2022   ALBUMIN 3.8 08/26/2017   ALBUMIN 4.0 06/25/2016   PREALBUMIN 24 09/25/2022    No results found for: "MG" No results found for: "VD25OH"  Lab Results  Component Value Date   PREALBUMIN 24 09/25/2022      Latest Ref Rng & Units 12/17/2022   11:35 AM 11/07/2022   12:30 PM 10/28/2022    5:44 AM  CBC EXTENDED  WBC 4.0 - 10.5 K/uL 6.0  5.7  4.8   RBC 3.87 - 5.11 MIL/uL 3.67  3.78  3.64   Hemoglobin 12.0 - 15.0 g/dL 10.4  11.0  10.8   HCT 36.0 - 46.0 % 32.8  35.2  33.1   Platelets 150 - 400 K/uL 289  348  303   NEUT# 1.7 - 7.7 K/uL   2.6   Lymph# 0.7 - 4.0 K/uL   1.4      There is no height or weight on file to calculate BMI.  Orders:  No orders of the defined types were placed in this encounter.  No orders of the defined types were placed in this encounter.    Procedures: No procedures performed  Clinical Data: No additional findings.  ROS:  All other systems negative, except as noted in the HPI. Review of Systems  Objective: Vital Signs: There were no vitals taken for this visit.  Specialty Comments:  No specialty comments available.  PMFS History: Patient Active Problem List   Diagnosis Date Noted   Tobacco abuse 01/16/2023   Chronic respiratory failure with hypoxia (HCC) 01/16/2023   Septic bursitis of elbow 10/24/2022   COPD exacerbation ([place]) 09/02/2022   Bursitis of elbow 05/28/2022   Elbow wound, left, initial encounter 05/11/2022   Centrilobular emphysema ([place]) 05/09/2022   Triceps tendon rupture, left, initial encounter 02/07/2022   Fall 12/31/2021   Contusion of left arm 12/31/2021   Acute left-sided low back pain without sciatica 12/31/2021   Neck pain 12/31/2021   Encounter for smoking cessation counseling 11/15/2021   Chronic obstructive pulmonary disease ([place]) 02/26/2021    Hyperlipidemia associated with type 2 diabetes mellitus ([place]) 11/05/2020   Non-restorative sleep 11/05/2020   Snoring 11/05/2020   Anterolisthesis 07/30/2020   Acute stress reaction 09/12/2018   Current smoker 06/08/2018   Diabetes mellitus, type II, insulin dependent ([place]) 06/08/2018   Claustrophobia 06/08/2018   Primary insomnia 06/08/2018   Hot flashes 06/08/2018   Abnormal weight gain 06/08/2018   Lower leg edema 11/10/2017   Insomnia due to other mental disorder 10/27/2017   History of substance abuse ([place]) 10/27/2017   GAD (generalized anxiety disorder) 10/27/2017   Class 3 severe obesity due to excess calories with serious comorbidity and body mass index (BMI) of 40.0 to 44.9 in adult ([place]) 08/31/2017   Cerumen impaction 01/24/2015   Hyperlipidemia 01/24/2015   Gastroesophageal reflux disease with esophagitis 01/10/2015   Foot callus 10/03/2014   PTSD (post-traumatic stress disorder) 09/26/2014   Depression 05/19/2014   Anxiety 05/19/2014   Chronic bilateral low back pain with right-sided sciatica 05/19/2014   DDD (degenerative disc disease), lumbar 05/19/2014   Neuropathy 02/05/2014   IT band syndrome 02/05/2014   Sinus tachycardia 02/05/2014   Memory changes 02/05/2014   Word finding difficulty 02/05/2014   Cervical cancer ([place]) 02/05/2014   Type II diabetes mellitus, uncontrolled 02/03/2014   Unspecified asthma(493.90) 02/03/2014   Essential hypertension, benign 02/03/2014   Pulmonary emphysema ([place]) 07/26/2012   Past Medical History:  Diagnosis Date   Anxiety    Arthritis    Asthma    Cancer ([place])    uterine   COPD (chronic obstructive pulmonary disease) ([place])    Degenerative disc disease, lumbar    Depression    Diabetes mellitus without complication (HCC)    Type II   Dyspnea    with exertion due to COPD   GERD (gastroesophageal reflux disease)    History of alcohol abuse    20-30 years ago, patient was going through divorce but does not currently  drink any alcohol   History of kidney stones    passed   History of marijuana use    "its been a long time since I last smoked because of my COPD"   History of ovarian cancer 1984   Hypertension    Peripheral neuropathy    Pneumonia    Substance abuse ([place])    patient denies, states she has only ever used marijuana   Swelling of both lower extremities     Family History  Problem Relation Age of Onset   Cancer Paternal Grandmother    Diabetes Paternal Grandmother    Diabetes Paternal Grandfather    Heart attack Maternal  Aunt    Cancer Maternal Uncle    Cancer Maternal Grandmother    Heart attack Mother    Cancer Mother    Stroke Mother    Diabetes Father    Heart attack Father    Diabetes Paternal Uncle    Diabetes Paternal Uncle     Past Surgical History:  Procedure Laterality Date   APPLICATION OF WOUND VAC Left 12/17/2022   Procedure: APPLICATION OF WOUND VAC;  Surgeon: [name], MD;  Location: [facility];  Service: Orthopedics;  Laterality: Left;   CERVICAL CONIZATION W/BX  1985   DILATION AND CURETTAGE OF UTERUS     ECTOPIC PREGNANCY SURGERY     patient stated she had to have tube removed   HARDWARE REMOVAL Left 09/17/2022   Procedure: LEFT ELBOW HARDWARE REMOVAL;  Surgeon: Leandrew Koyanagi, MD;  Location: [facility];  Service: Orthopedics;  Laterality: Left;   I & D EXTREMITY Left 05/12/2022   Procedure: IRRIGATION AND DEBRIDEMENT LEFT ELBOW;  Surgeon: Leandrew Koyanagi, MD;  Location: [facility];  Service: Orthopedics;  Laterality: Left;   I & D EXTREMITY Left 07/18/2022   Procedure: LEFT ELBOW IRRIGATION AND DEBRIDEMENT EXTREMITY;  Surgeon: Leandrew Koyanagi, MD;  Location: [facility];  Service: Orthopedics;  Laterality: Left;   I & D EXTREMITY Left 10/24/2022   Procedure: LEFT ELBOW DEBRIDEMENT;  Surgeon: [name], MD;  Location: [facility];  Service: Orthopedics;  Laterality: Left;   I & D EXTREMITY Left 10/29/2022   Procedure: LEFT ELBOW DEBRIDEMENT;  Surgeon: [name], MD;  Location: [facility];  Service: Orthopedics;  Laterality: Left;   I & D EXTREMITY Left 12/17/2022   Procedure: LEFT ELBOW DEBRIDEMENT;  Surgeon: [name], MD;  Location: [facility];  Service: Orthopedics;  Laterality: Left;   IRRIGATION AND DEBRIDEMENT ELBOW Left 09/17/2022   Procedure: LEFT IRRIGATION AND DEBRIDEMENT ELBOW;  Surgeon: Leandrew Koyanagi, MD;  Location: [facility];  Service: Orthopedics;  Laterality: Left;   TONSILLECTOMY     TRICEPS TENDON REPAIR Left 02/07/2022   Procedure: left triceps repair;  Surgeon: Leandrew Koyanagi, MD;  Location: [facility];  Service: Orthopedics;  Laterality: Left;   Social History   Occupational History   Not on file  Tobacco Use   Smoking status: Former    Packs/day: 0.10    Years: 35.00    Total pack years: 3.50    Types: Cigarettes    Quit date: 11/16/2022    Years since quitting: 0.1    Passive exposure: Never   Smokeless tobacco: Never   Tobacco comments:    Smokes 2 cigarettes/day  Vaping Use   Vaping Use: Never used  Substance and Sexual Activity   Alcohol use: Not Currently   Drug use: Not Currently    Types: Marijuana    Comment: 10/23/22- has not used since 2019   Sexual activity: Yes    Birth control/protection: Post-menopausal

## 2023-01-22 ENCOUNTER — Other Ambulatory Visit: Payer: Self-pay | Admitting: Physician Assistant

## 2023-01-22 DIAGNOSIS — M5136 Other intervertebral disc degeneration, lumbar region: Secondary | ICD-10-CM

## 2023-01-22 DIAGNOSIS — G629 Polyneuropathy, unspecified: Secondary | ICD-10-CM

## 2023-01-22 DIAGNOSIS — G8929 Other chronic pain: Secondary | ICD-10-CM

## 2023-01-23 ENCOUNTER — Ambulatory Visit (INDEPENDENT_AMBULATORY_CARE_PROVIDER_SITE_OTHER): Payer: 59 | Admitting: Physician Assistant

## 2023-01-23 ENCOUNTER — Encounter: Payer: Self-pay | Admitting: Physician Assistant

## 2023-01-23 VITALS — BP 117/41 | HR 60 | Ht 66.0 in | Wt 291.0 lb

## 2023-01-23 DIAGNOSIS — Z6841 Body Mass Index (BMI) 40.0 and over, adult: Secondary | ICD-10-CM

## 2023-01-23 DIAGNOSIS — E119 Type 2 diabetes mellitus without complications: Secondary | ICD-10-CM

## 2023-01-23 DIAGNOSIS — S51002S Unspecified open wound of left elbow, sequela: Secondary | ICD-10-CM

## 2023-01-23 DIAGNOSIS — J432 Centrilobular emphysema: Secondary | ICD-10-CM | POA: Diagnosis not present

## 2023-01-23 DIAGNOSIS — I10 Essential (primary) hypertension: Secondary | ICD-10-CM | POA: Diagnosis not present

## 2023-01-23 DIAGNOSIS — Z794 Long term (current) use of insulin: Secondary | ICD-10-CM

## 2023-01-23 DIAGNOSIS — E1169 Type 2 diabetes mellitus with other specified complication: Secondary | ICD-10-CM | POA: Diagnosis not present

## 2023-01-23 DIAGNOSIS — Z1231 Encounter for screening mammogram for malignant neoplasm of breast: Secondary | ICD-10-CM | POA: Diagnosis not present

## 2023-01-23 DIAGNOSIS — E1165 Type 2 diabetes mellitus with hyperglycemia: Secondary | ICD-10-CM | POA: Diagnosis not present

## 2023-01-23 DIAGNOSIS — J441 Chronic obstructive pulmonary disease with (acute) exacerbation: Secondary | ICD-10-CM | POA: Diagnosis not present

## 2023-01-23 DIAGNOSIS — I959 Hypotension, unspecified: Secondary | ICD-10-CM

## 2023-01-23 DIAGNOSIS — E785 Hyperlipidemia, unspecified: Secondary | ICD-10-CM | POA: Diagnosis not present

## 2023-01-23 HISTORY — DX: Hypotension, unspecified: I95.9

## 2023-01-23 LAB — POCT GLYCOSYLATED HEMOGLOBIN (HGB A1C): Hemoglobin A1C: 9 % — AB (ref 4.0–5.6)

## 2023-01-23 MED ORDER — METFORMIN HCL 1000 MG PO TABS: 1000.0000 mg | ORAL_TABLET | Freq: Two times a day (BID) | ORAL | 0 refills | Status: DC

## 2023-01-23 MED ORDER — HYDROCHLOROTHIAZIDE 25 MG PO TABS: 25.0000 mg | ORAL_TABLET | Freq: Every day | ORAL | 0 refills | Status: DC

## 2023-01-23 MED ORDER — GLIPIZIDE 10 MG PO TABS: 10.0000 mg | ORAL_TABLET | Freq: Two times a day (BID) | ORAL | 0 refills | Status: DC

## 2023-01-23 MED ORDER — AMLODIPINE BESYLATE 2.5 MG PO TABS: 2.5000 mg | ORAL_TABLET | Freq: Every day | ORAL | 0 refills | Status: DC

## 2023-01-23 MED ORDER — PIOGLITAZONE HCL 45 MG PO TABS: 45.0000 mg | ORAL_TABLET | Freq: Every day | ORAL | 0 refills | Status: DC

## 2023-01-23 NOTE — Progress Notes (Unsigned)
Established Patient Office Visit  Subjective   Patient ID: Kathleen Le, female    DOB: [birth year]  Age: 62 y.o. MRN: [id number]  Chief Complaint  Patient presents with   Follow-up   Diabetes    HPI Pt is a 62 yo obese female with T2DM, HTN, HLD, MDD, GAD, COPD who presents to the clinic for follow up.   She has stopped smoking for the last 2 months. She is on weight watchers and lost 12lbs in the last 6 weeks. She is trying to eat better and stay more active. She is finding her sugars are dropping at night and in am below 50. She is still taking novolin 12 units at bedtime. She has healing open wound of left elbow from chronic infection. No CP, palpitations, headaches or vision changes.   .. Active Ambulatory Problems    Diagnosis Date Noted   Type II diabetes mellitus, uncontrolled 02/03/2014   Unspecified asthma(493.90) 02/03/2014   Essential hypertension, benign 02/03/2014   Neuropathy 02/05/2014   IT band syndrome 02/05/2014   Sinus tachycardia 02/05/2014   Memory changes 02/05/2014   Word finding difficulty 02/05/2014   Cervical cancer ([place]) 02/05/2014   Depression 05/19/2014   Anxiety 05/19/2014   Chronic bilateral low back pain with right-sided sciatica 05/19/2014   DDD (degenerative disc disease), lumbar 05/19/2014   PTSD (post-traumatic stress disorder) 09/26/2014   Foot callus 10/03/2014   Gastroesophageal reflux disease with esophagitis 01/10/2015   Cerumen impaction 01/24/2015   Hyperlipidemia 01/24/2015   Pulmonary emphysema ([place]) 07/26/2012   Class 3 severe obesity due to excess calories with serious comorbidity and body mass index (BMI) of 45.0 to 49.9 in adult ([place]) 08/31/2017   Insomnia due to other mental disorder 10/27/2017   History of substance abuse ([place]) 10/27/2017   GAD (generalized anxiety disorder) 10/27/2017   Lower leg edema 11/10/2017   Current smoker 06/08/2018   Diabetes mellitus, type II, insulin dependent ([place]) 06/08/2018    Claustrophobia 06/08/2018   Primary insomnia 06/08/2018   Hot flashes 06/08/2018   Abnormal weight gain 06/08/2018   Acute stress reaction 09/12/2018   Anterolisthesis 07/30/2020   Hyperlipidemia associated with type 2 diabetes mellitus ([place]) 11/05/2020   Non-restorative sleep 11/05/2020   Snoring 11/05/2020   Chronic obstructive pulmonary disease ([place]) 02/26/2021   Encounter for smoking cessation counseling 11/15/2021   Fall 12/31/2021   Contusion of left arm 12/31/2021   Acute left-sided low back pain without sciatica 12/31/2021   Neck pain 12/31/2021   Triceps tendon rupture, left, initial encounter 02/07/2022   Centrilobular emphysema ([place]) 05/09/2022   Elbow wound, left, sequela 05/11/2022   Bursitis of elbow 05/28/2022   COPD exacerbation ([place]) 09/02/2022   Septic bursitis of elbow 10/24/2022   Tobacco abuse 01/16/2023   Chronic respiratory failure with hypoxia ([place]) 01/16/2023   Hypotension 01/23/2023   Resolved Ambulatory Problems    Diagnosis Date Noted   Bruised rib 09/26/2014   Bruised ribs 09/26/2014   Wound of right leg 09/26/2014   Essential hypertension 11/10/2017   Former smoker 03/09/2018   Past Medical History:  Diagnosis Date   Arthritis    Asthma    Cancer ([place])    COPD (chronic obstructive pulmonary disease) ([place])    Degenerative disc disease, lumbar    Diabetes mellitus without complication (HCC)    Dyspnea    GERD (gastroesophageal reflux disease)    History of alcohol abuse    History of kidney stones    History of marijuana  use    History of ovarian cancer 1984   Hypertension    Peripheral neuropathy    Pneumonia    Substance abuse ([place])    Swelling of both lower extremities      ROS   See HPI.  Objective:     BP (!) 117/41   Pulse 60   Ht '5\' 6"'$  (1.676 m)   Wt 291 lb (132 kg)   SpO2 100%   BMI 46.97 kg/m  BP Readings from Last 3 Encounters:  01/23/23 (!) 117/41  01/16/23 130/60  12/17/22 (!) 149/59   Wt Readings from  Last 3 Encounters:  01/23/23 291 lb (132 kg)  01/16/23 298 lb 9.6 oz (135.4 kg)  12/17/22 300 lb (136.1 kg)    .[name] Results for orders placed or performed in visit on 01/23/23  POCT glycosylated hemoglobin (Hb A1C)  Result Value Ref Range   Hemoglobin A1C 9.0 (A) 4.0 - 5.6 %   HbA1c POC (<> result, manual entry)     HbA1c, POC (prediabetic range)     HbA1c, POC (controlled diabetic range)       Physical Exam Vitals reviewed.  Constitutional:      Appearance: Normal appearance. She is obese.  Cardiovascular:     Rate and Rhythm: Normal rate.     Pulses: Normal pulses.  Pulmonary:     Effort: Pulmonary effort is normal.  Skin:    Findings: No lesion or rash.     Comments: Left elbow wound covered by ace wrap.   Neurological:     General: No focal deficit present.     Mental Status: She is alert and oriented to person, place, and time.  Psychiatric:        Mood and Affect: Mood normal.      The 10-year ASCVD risk score (Arnett DK, et al., 2019) is: 11.4%    Assessment & Plan:  [name] KitchenEmunah was seen today for follow-up and diabetes.  Diagnoses and all orders for this visit:  Uncontrolled type 2 diabetes mellitus with hyperglycemia (HCC) -     POCT glycosylated hemoglobin (Hb A1C) -     Hemoglobin A1c -     pioglitazone (ACTOS) 45 MG tablet; Take 1 tablet (45 mg total) by mouth daily.  Hyperlipidemia associated with type 2 diabetes mellitus ([place]) -     Lipid Panel w/reflex Direct LDL -     COMPLETE METABOLIC PANEL WITH GFR  COPD exacerbation (HCC) -     COMPLETE METABOLIC PANEL WITH GFR  Centrilobular emphysema (HCC) -     COMPLETE METABOLIC PANEL WITH GFR  Diabetes mellitus, type II, insulin dependent (HCC) -     glipiZIDE (GLUCOTROL) 10 MG tablet; Take 1 tablet (10 mg total) by mouth 2 (two) times daily before a meal. -     metFORMIN (GLUCOPHAGE) 1000 MG tablet; Take 1 tablet (1,000 mg total) by mouth 2 (two) times daily with a meal.  Essential hypertension,  benign -     amLODipine (NORVASC) 2.5 MG tablet; Take 1 tablet (2.5 mg total) by mouth daily. -     hydrochlorothiazide (HYDRODIURIL) 25 MG tablet; Take 1 tablet (25 mg total) by mouth daily.  Visit for screening mammogram -     MM 3D SCREEN BREAST BILATERAL  Hypotension, unspecified hypotension type  Class 3 severe obesity due to excess calories with serious comorbidity and body mass index (BMI) of 45.0 to 49.9 in adult [facility])  Elbow wound, left, sequela  Other orders -  rosuvastatin (CRESTOR) 20 MG tablet; Take 1 tablet (20 mg total) by mouth daily.   A1C is 9 but we have been having problems with A1C readings and patient is stating that she dropping low at night and in the mornings Will check A1c in blood today. Decrease insulin to 8 units and then decrease by 2 units every 3-5 days until fasting is 90-120.  Continue to check sugar during the day to see if she is having day time spikes.  Continue glipizide and januvia and metformin.  BP low, decrease norvasc to 2.'5mg'$  daily.  On statin.  Needs eye exam Needs mammogram Vaccines UTD Follow up in 3 months  Pt has stopped smoking.   Wound management by wound clinic.   [name].Discussed low carb diet with 1500 calories and 80g of protein.  Exercising at least 150 minutes a week.  [employer] could be a Microbiologist.         Iran Planas, PA-C

## 2023-01-23 NOTE — Patient Instructions (Signed)
Decrease night time insulin to 8units and decrease by 2 units every 3-5 days until fasting glucose is 90-120.  Wear compression socks

## 2023-01-24 DIAGNOSIS — J9690 Respiratory failure, unspecified, unspecified whether with hypoxia or hypercapnia: Secondary | ICD-10-CM | POA: Diagnosis not present

## 2023-01-26 ENCOUNTER — Encounter: Payer: Self-pay | Admitting: Physician Assistant

## 2023-01-26 ENCOUNTER — Other Ambulatory Visit: Payer: Self-pay | Admitting: Physician Assistant

## 2023-01-26 DIAGNOSIS — E119 Type 2 diabetes mellitus without complications: Secondary | ICD-10-CM

## 2023-01-26 DIAGNOSIS — E1165 Type 2 diabetes mellitus with hyperglycemia: Secondary | ICD-10-CM

## 2023-01-26 MED ORDER — ROSUVASTATIN CALCIUM 20 MG PO TABS: 20.0000 mg | ORAL_TABLET | Freq: Every day | ORAL | 3 refills | Status: DC

## 2023-02-02 DIAGNOSIS — J441 Chronic obstructive pulmonary disease with (acute) exacerbation: Secondary | ICD-10-CM | POA: Diagnosis not present

## 2023-02-02 DIAGNOSIS — E785 Hyperlipidemia, unspecified: Secondary | ICD-10-CM | POA: Diagnosis not present

## 2023-02-02 DIAGNOSIS — E1169 Type 2 diabetes mellitus with other specified complication: Secondary | ICD-10-CM | POA: Diagnosis not present

## 2023-02-02 DIAGNOSIS — J432 Centrilobular emphysema: Secondary | ICD-10-CM | POA: Diagnosis not present

## 2023-02-02 DIAGNOSIS — E1165 Type 2 diabetes mellitus with hyperglycemia: Secondary | ICD-10-CM | POA: Diagnosis not present

## 2023-02-03 LAB — COMPLETE METABOLIC PANEL WITH GFR
AG Ratio: 1.2 (calc) (ref 1.0–2.5)
ALT: 12 U/L (ref 6–29)
AST: 13 U/L (ref 10–35)
Albumin: 3.7 g/dL (ref 3.6–5.1)
Alkaline phosphatase (APISO): 69 U/L (ref 37–153)
BUN: 25 mg/dL (ref 7–25)
CO2: 28 mmol/L (ref 20–32)
Calcium: 9.4 mg/dL (ref 8.6–10.4)
Chloride: 104 mmol/L (ref 98–110)
Creat: 0.96 mg/dL (ref 0.50–1.03)
Globulin: 3 g/dL (calc) (ref 1.9–3.7)
Glucose, Bld: 148 mg/dL — ABNORMAL HIGH (ref 65–99)
Potassium: 4.4 mmol/L (ref 3.5–5.3)
Sodium: 142 mmol/L (ref 135–146)
Total Bilirubin: 0.2 mg/dL (ref 0.2–1.2)
Total Protein: 6.7 g/dL (ref 6.1–8.1)
eGFR: 68 mL/min/{1.73_m2} (ref >=60)

## 2023-02-03 LAB — HEMOGLOBIN A1C
Hgb A1c MFr Bld: 7.8 % of total Hgb — ABNORMAL HIGH (ref <5.7)
Mean Plasma Glucose: 177 mg/dL
eAG (mmol/L): 9.8 mmol/L

## 2023-02-03 LAB — LIPID PANEL W/REFLEX DIRECT LDL
Cholesterol: 162 mg/dL (ref <200)
HDL: 57 mg/dL (ref >=50)
LDL Cholesterol (Calc): 77 mg/dL (calc)
Non-HDL Cholesterol (Calc): 105 mg/dL (calc) (ref <130)
Total CHOL/HDL Ratio: 2.8 (calc) (ref <5.0)
Triglycerides: 188 mg/dL — ABNORMAL HIGH (ref <150)

## 2023-02-03 NOTE — Progress Notes (Signed)
Kathleen Le,   Kidney and liver look great.  Glucose is improving.  A1C down to 7.8. goal is under 7.  LDL very close to goal.  TG a little elevated.   Keep up the great work.

## 2023-02-07 ENCOUNTER — Other Ambulatory Visit: Payer: Self-pay | Admitting: Physician Assistant

## 2023-02-13 ENCOUNTER — Other Ambulatory Visit: Payer: Self-pay | Admitting: Physician Assistant

## 2023-02-13 DIAGNOSIS — F40240 Claustrophobia: Secondary | ICD-10-CM

## 2023-02-13 DIAGNOSIS — F5101 Primary insomnia: Secondary | ICD-10-CM

## 2023-02-17 ENCOUNTER — Ambulatory Visit (INDEPENDENT_AMBULATORY_CARE_PROVIDER_SITE_OTHER): Payer: 59 | Admitting: Orthopedic Surgery

## 2023-02-17 DIAGNOSIS — S51002D Unspecified open wound of left elbow, subsequent encounter: Secondary | ICD-10-CM

## 2023-02-17 MED ORDER — SILVER SULFADIAZINE 1 % EX CREA: 1.0000 | TOPICAL_CREAM | Freq: Every day | CUTANEOUS | 3 refills | Status: DC

## 2023-02-24 DIAGNOSIS — J9690 Respiratory failure, unspecified, unspecified whether with hypoxia or hypercapnia: Secondary | ICD-10-CM | POA: Diagnosis not present

## 2023-03-03 ENCOUNTER — Other Ambulatory Visit: Payer: Self-pay | Admitting: Physician Assistant

## 2023-03-03 ENCOUNTER — Encounter: Payer: Self-pay | Admitting: Orthopedic Surgery

## 2023-03-03 DIAGNOSIS — I10 Essential (primary) hypertension: Secondary | ICD-10-CM

## 2023-03-03 NOTE — Progress Notes (Signed)
Office Visit Note   Patient: Kathleen Le           Date of Birth: May 22, 1961           MRN: [id number] Visit Date: 02/17/2023              Requested by: Donella Stade, PA-C [place],  [place] [address] PCP: Donella Stade, [place]  Chief Complaint  Patient presents with   Left Elbow - Routine Post Op    12/17/2022 left elbow debridement kerecis graft 38 micro       HPI: Patient is a 62 year old woman who is 2 months status post debridement left olecranon bursal wound with application of Kerecis graft.  Assessment & Plan: Visit Diagnoses:  1. Elbow wound, left, subsequent encounter     Plan: Patient will continue with compression Dial soap cleansing and dry dressing changes.  Prescription provided for Silvadene.  Follow-Up Instructions: Return in about 4 weeks (around 03/17/2023).   Ortho Exam  Patient is alert, oriented, no adenopathy, well-dressed, normal affect, normal respiratory effort. Examination the wound continues to heal slowly there is healthy granulation tissue the wound is flat and measures 15 x 35 mm.  Imaging: No results found.   Labs: Lab Results  Component Value Date   HGBA1C 7.8 (H) 02/02/2023   HGBA1C 9.0 (A) 01/23/2023   HGBA1C 7.3 (A) 05/06/2022   ESRSEDRATE 45 (H) 11/07/2022   ESRSEDRATE 32 (H) 10/24/2022   ESRSEDRATE 20 09/17/2022   CRP 0.7 11/07/2022   CRP <0.5 10/24/2022   REPTSTATUS 12/22/2022 FINAL 12/17/2022   GRAMSTAIN NO WBC SEEN NO ORGANISMS SEEN  12/17/2022   CULT  12/17/2022    RARE DIPHTHEROIDS(CORYNEBACTERIUM SPECIES) Standardized susceptibility testing for this organism is not available. NO ANAEROBES ISOLATED Performed at [facility], [place] [address], [place], [place] [address]    LABORGA NO GROWTH 01/24/2015     Lab Results  Component Value Date   ALBUMIN 3.2 (L) 11/07/2022   ALBUMIN 3.8 08/26/2017   ALBUMIN 4.0 06/25/2016   PREALBUMIN 24 09/25/2022    No results found  for: "MG" No results found for: "VD25OH"  Lab Results  Component Value Date   PREALBUMIN 24 09/25/2022      Latest Ref Rng & Units 12/17/2022   11:35 AM 11/07/2022   12:30 PM 10/28/2022    5:44 AM  CBC EXTENDED  WBC 4.0 - 10.5 K/uL 6.0  5.7  4.8   RBC 3.87 - 5.11 MIL/uL 3.67  3.78  3.64   Hemoglobin 12.0 - 15.0 g/dL 10.4  11.0  10.8   HCT 36.0 - 46.0 % 32.8  35.2  33.1   Platelets 150 - 400 K/uL 289  348  303   NEUT# 1.7 - 7.7 K/uL   2.6   Lymph# 0.7 - 4.0 K/uL   1.4      There is no height or weight on file to calculate BMI.  Orders:  No orders of the defined types were placed in this encounter.  Meds ordered this encounter  Medications   silver sulfADIAZINE (SILVADENE) 1 % cream    Sig: Apply 1 Application topically daily. Apply to affected area daily plus dry dressing    Dispense:  400 g    Refill:  3     Procedures: No procedures performed  Clinical Data: No additional findings.  ROS:  All other systems negative, except as noted in the HPI. Review of Systems  Objective: Vital Signs: There were no vitals taken for this visit.  Specialty Comments:  No specialty comments available.  PMFS History: Patient Active Problem List   Diagnosis Date Noted   Hypotension 01/23/2023   Tobacco abuse 01/16/2023   Chronic respiratory failure with hypoxia ([place]) 01/16/2023   Septic bursitis of elbow 10/24/2022   COPD exacerbation ([place]) 09/02/2022   Bursitis of elbow 05/28/2022   Elbow wound, left, sequela 05/11/2022   Centrilobular emphysema ([place]) 05/09/2022   Triceps tendon rupture, left, initial encounter 02/07/2022   Fall 12/31/2021   Contusion of left arm 12/31/2021   Acute left-sided low back pain without sciatica 12/31/2021   Neck pain 12/31/2021   Encounter for smoking cessation counseling 11/15/2021   Chronic obstructive pulmonary disease ([place]) 02/26/2021   Hyperlipidemia associated with type 2 diabetes mellitus ([place]) 11/05/2020   Non-restorative sleep  11/05/2020   Snoring 11/05/2020   Anterolisthesis 07/30/2020   Acute stress reaction 09/12/2018   Current smoker 06/08/2018   Diabetes mellitus, type II, insulin dependent ([place]) 06/08/2018   Claustrophobia 06/08/2018   Primary insomnia 06/08/2018   Hot flashes 06/08/2018   Abnormal weight gain 06/08/2018   Lower leg edema 11/10/2017   Insomnia due to other mental disorder 10/27/2017   History of substance abuse ([place]) 10/27/2017   GAD (generalized anxiety disorder) 10/27/2017   Class 3 severe obesity due to excess calories with serious comorbidity and body mass index (BMI) of 45.0 to 49.9 in adult ([place]) 08/31/2017   Cerumen impaction 01/24/2015   Hyperlipidemia 01/24/2015   Gastroesophageal reflux disease with esophagitis 01/10/2015   Foot callus 10/03/2014   PTSD (post-traumatic stress disorder) 09/26/2014   Depression 05/19/2014   Anxiety 05/19/2014   Chronic bilateral low back pain with right-sided sciatica 05/19/2014   DDD (degenerative disc disease), lumbar 05/19/2014   Neuropathy 02/05/2014   IT band syndrome 02/05/2014   Sinus tachycardia 02/05/2014   Memory changes 02/05/2014   Word finding difficulty 02/05/2014   Cervical cancer ([place]) 02/05/2014   Type II diabetes mellitus, uncontrolled 02/03/2014   Unspecified asthma(493.90) 02/03/2014   Essential hypertension, benign 02/03/2014   Pulmonary emphysema ([place]) 07/26/2012   Past Medical History:  Diagnosis Date   Anxiety    Arthritis    Asthma    Cancer ([place])    uterine   COPD (chronic obstructive pulmonary disease) ([place])    Degenerative disc disease, lumbar    Depression    Diabetes mellitus without complication (HCC)    Type II   Dyspnea    with exertion due to COPD   GERD (gastroesophageal reflux disease)    History of alcohol abuse    20-30 years ago, patient was going through divorce but does not currently drink any alcohol   History of kidney stones    passed   History of marijuana use    "its been a  long time since I last smoked because of my COPD"   History of ovarian cancer 1984   Hypertension    Peripheral neuropathy    Pneumonia    Substance abuse ([place])    patient denies, states she has only ever used marijuana   Swelling of both lower extremities     Family History  Problem Relation Age of Onset   Cancer Paternal Grandmother    Diabetes Paternal Grandmother    Diabetes Paternal Grandfather    Heart attack Maternal Aunt    Cancer Maternal Uncle    Cancer Maternal Grandmother    Heart attack Mother  Cancer Mother    Stroke Mother    Diabetes Father    Heart attack Father    Diabetes Paternal Uncle    Diabetes Paternal Uncle     Past Surgical History:  Procedure Laterality Date   APPLICATION OF WOUND VAC Left 12/17/2022   Procedure: APPLICATION OF WOUND VAC;  Surgeon: [name], MD;  Location: [facility];  Service: Orthopedics;  Laterality: Left;   CERVICAL CONIZATION W/BX  1985   DILATION AND CURETTAGE OF UTERUS     ECTOPIC PREGNANCY SURGERY     patient stated she had to have tube removed   HARDWARE REMOVAL Left 09/17/2022   Procedure: LEFT ELBOW HARDWARE REMOVAL;  Surgeon: Leandrew Koyanagi, MD;  Location: [facility];  Service: Orthopedics;  Laterality: Left;   I & D EXTREMITY Left 05/12/2022   Procedure: IRRIGATION AND DEBRIDEMENT LEFT ELBOW;  Surgeon: Leandrew Koyanagi, MD;  Location: [facility];  Service: Orthopedics;  Laterality: Left;   I & D EXTREMITY Left 07/18/2022   Procedure: LEFT ELBOW IRRIGATION AND DEBRIDEMENT EXTREMITY;  Surgeon: Leandrew Koyanagi, MD;  Location: [facility];  Service: Orthopedics;  Laterality: Left;   I & D EXTREMITY Left 10/24/2022   Procedure: LEFT ELBOW DEBRIDEMENT;  Surgeon: [name], MD;  Location: [facility];  Service: Orthopedics;  Laterality: Left;   I & D EXTREMITY Left 10/29/2022   Procedure: LEFT ELBOW DEBRIDEMENT;  Surgeon: [name], MD;  Location: [facility];  Service: Orthopedics;  Laterality: Left;   I & D EXTREMITY Left  12/17/2022   Procedure: LEFT ELBOW DEBRIDEMENT;  Surgeon: [name], MD;  Location: [facility];  Service: Orthopedics;  Laterality: Left;   IRRIGATION AND DEBRIDEMENT ELBOW Left 09/17/2022   Procedure: LEFT IRRIGATION AND DEBRIDEMENT ELBOW;  Surgeon: Leandrew Koyanagi, MD;  Location[facility];  Service: Orthopedics;  Laterality: Left;   TONSILLECTOMY     TRICEPS TENDON REPAIR Left 02/07/2022   Procedure: left triceps repair;  Surgeon: Leandrew Koyanagi, MD;  Location: [facility];  Service: Orthopedics;  Laterality: Left;   Social History   Occupational History   Not on file  Tobacco Use   Smoking status: Former    Packs/day: 0.10    Years: 35.00    Total pack years: 3.50    Types: Cigarettes    Quit date: 11/16/2022    Years since quitting: 0.2    Passive exposure: Never   Smokeless tobacco: Never   Tobacco comments:    Smokes 2 cigarettes/day  Vaping Use   Vaping Use: Never used  Substance and Sexual Activity   Alcohol use: Not Currently   Drug use: Not Currently    Types: Marijuana    Comment: 10/23/22- has not used since 2019   Sexual activity: Yes    Birth control/protection: Post-menopausal

## 2023-03-10 ENCOUNTER — Other Ambulatory Visit: Payer: Self-pay | Admitting: Physician Assistant

## 2023-03-10 DIAGNOSIS — J441 Chronic obstructive pulmonary disease with (acute) exacerbation: Secondary | ICD-10-CM

## 2023-03-10 DIAGNOSIS — R0602 Shortness of breath: Secondary | ICD-10-CM

## 2023-03-10 DIAGNOSIS — J432 Centrilobular emphysema: Secondary | ICD-10-CM

## 2023-03-12 ENCOUNTER — Other Ambulatory Visit: Payer: Self-pay | Admitting: Physician Assistant

## 2023-03-12 DIAGNOSIS — J432 Centrilobular emphysema: Secondary | ICD-10-CM

## 2023-03-17 ENCOUNTER — Ambulatory Visit: Payer: 59 | Admitting: Orthopedic Surgery

## 2023-03-17 DIAGNOSIS — S51002A Unspecified open wound of left elbow, initial encounter: Secondary | ICD-10-CM

## 2023-03-22 ENCOUNTER — Encounter: Payer: Self-pay | Admitting: Orthopedic Surgery

## 2023-03-22 NOTE — Progress Notes (Signed)
Office Visit Note   Patient: Kathleen Le           Date of Birth: 1961/07/08           MRN: [id number] Visit Date: 03/17/2023              Requested by: Donella Stade, PA-C [address],  [place] [address] PCP: Donella Stade, [place]  Chief Complaint  Patient presents with   Left Elbow - Follow-up    12/17/2022 left elbow debridement kerecis graft 38 micro      HPI: Patient is a 62 year old woman who is 3 months status post left elbow debridement and application of Kerecis tissue graft.  Assessment & Plan: Visit Diagnoses:  1. Elbow wound, left, initial encounter     Plan: Patient continues to show progressive healing she will continue with Silvadene dressing changes.  Follow-Up Instructions: Return in about 4 weeks (around 04/14/2023).   Ortho Exam  Patient is alert, oriented, no adenopathy, well-dressed, normal affect, normal respiratory effort. Examination the wound has fibrinous exudate.  This was debrided back to healthy bleeding viable granulation tissue there is no cellulitis no drainage.  The wound is 1 x 2 cm.  Imaging: No results found.   Labs: Lab Results  Component Value Date   HGBA1C 7.8 (H) 02/02/2023   HGBA1C 9.0 (A) 01/23/2023   HGBA1C 7.3 (A) 05/06/2022   ESRSEDRATE 45 (H) 11/07/2022   ESRSEDRATE 32 (H) 10/24/2022   ESRSEDRATE 20 09/17/2022   CRP 0.7 11/07/2022   CRP <0.5 10/24/2022   REPTSTATUS 12/22/2022 FINAL 12/17/2022   GRAMSTAIN NO WBC SEEN NO ORGANISMS SEEN  12/17/2022   CULT  12/17/2022    RARE DIPHTHEROIDS(CORYNEBACTERIUM SPECIES) Standardized susceptibility testing for this organism is not available. NO ANAEROBES ISOLATED Performed at [facility], [place] [address], [place], [place] [address]    LABORGA NO GROWTH 01/24/2015     Lab Results  Component Value Date   ALBUMIN 3.2 (L) 11/07/2022   ALBUMIN 3.8 08/26/2017   ALBUMIN 4.0 06/25/2016   PREALBUMIN 24 09/25/2022    No results found  for: "MG" No results found for: "VD25OH"  Lab Results  Component Value Date   PREALBUMIN 24 09/25/2022      Latest Ref Rng & Units 12/17/2022   11:35 AM 11/07/2022   12:30 PM 10/28/2022    5:44 AM  CBC EXTENDED  WBC 4.0 - 10.5 K/uL 6.0  5.7  4.8   RBC 3.87 - 5.11 MIL/uL 3.67  3.78  3.64   Hemoglobin 12.0 - 15.0 g/dL 10.4  11.0  10.8   HCT 36.0 - 46.0 % 32.8  35.2  33.1   Platelets 150 - 400 K/uL 289  348  303   NEUT# 1.7 - 7.7 K/uL   2.6   Lymph# 0.7 - 4.0 K/uL   1.4      There is no height or weight on file to calculate BMI.  Orders:  No orders of the defined types were placed in this encounter.  No orders of the defined types were placed in this encounter.    Procedures: No procedures performed  Clinical Data: No additional findings.  ROS:  All other systems negative, except as noted in the HPI. Review of Systems  Objective: Vital Signs: There were no vitals taken for this visit.  Specialty Comments:  No specialty comments available.  PMFS History: Patient Active Problem List   Diagnosis Date Noted  Hypotension 01/23/2023   Tobacco abuse 01/16/2023   Chronic respiratory failure with hypoxia ([place]) 01/16/2023   Septic bursitis of elbow 10/24/2022   COPD exacerbation ([place]) 09/02/2022   Bursitis of elbow 05/28/2022   Elbow wound, left, sequela 05/11/2022   Centrilobular emphysema ([place]) 05/09/2022   Triceps tendon rupture, left, initial encounter 02/07/2022   Fall 12/31/2021   Contusion of left arm 12/31/2021   Acute left-sided low back pain without sciatica 12/31/2021   Neck pain 12/31/2021   Encounter for smoking cessation counseling 11/15/2021   Chronic obstructive pulmonary disease ([place]) 02/26/2021   Hyperlipidemia associated with type 2 diabetes mellitus ([place]) 11/05/2020   Non-restorative sleep 11/05/2020   Snoring 11/05/2020   Anterolisthesis 07/30/2020   Acute stress reaction 09/12/2018   Current smoker 06/08/2018   Diabetes mellitus, type  II, insulin dependent ([place]) 06/08/2018   Claustrophobia 06/08/2018   Primary insomnia 06/08/2018   Hot flashes 06/08/2018   Abnormal weight gain 06/08/2018   Lower leg edema 11/10/2017   Insomnia due to other mental disorder 10/27/2017   History of substance abuse ([place]) 10/27/2017   GAD (generalized anxiety disorder) 10/27/2017   Class 3 severe obesity due to excess calories with serious comorbidity and body mass index (BMI) of 45.0 to 49.9 in adult ([place]) 08/31/2017   Cerumen impaction 01/24/2015   Hyperlipidemia 01/24/2015   Gastroesophageal reflux disease with esophagitis 01/10/2015   Foot callus 10/03/2014   PTSD (post-traumatic stress disorder) 09/26/2014   Depression 05/19/2014   Anxiety 05/19/2014   Chronic bilateral low back pain with right-sided sciatica 05/19/2014   DDD (degenerative disc disease), lumbar 05/19/2014   Neuropathy 02/05/2014   IT band syndrome 02/05/2014   Sinus tachycardia 02/05/2014   Memory changes 02/05/2014   Word finding difficulty 02/05/2014   Cervical cancer ([place]) 02/05/2014   Type II diabetes mellitus, uncontrolled 02/03/2014   Unspecified asthma(493.90) 02/03/2014   Essential hypertension, benign 02/03/2014   Pulmonary emphysema ([place]) 07/26/2012   Past Medical History:  Diagnosis Date   Anxiety    Arthritis    Asthma    Cancer ([place])    uterine   COPD (chronic obstructive pulmonary disease) ([place])    Degenerative disc disease, lumbar    Depression    Diabetes mellitus without complication (HCC)    Type II   Dyspnea    with exertion due to COPD   GERD (gastroesophageal reflux disease)    History of alcohol abuse    20-30 years ago, patient was going through divorce but does not currently drink any alcohol   History of kidney stones    passed   History of marijuana use    "its been a long time since I last smoked because of my COPD"   History of ovarian cancer 1984   Hypertension    Peripheral neuropathy    Pneumonia    Substance  abuse ([place])    patient denies, states she has only ever used marijuana   Swelling of both lower extremities     Family History  Problem Relation Age of Onset   Cancer Paternal Grandmother    Diabetes Paternal Grandmother    Diabetes Paternal Grandfather    Heart attack Maternal Aunt    Cancer Maternal Uncle    Cancer Maternal Grandmother    Heart attack Mother    Cancer Mother    Stroke Mother    Diabetes Father    Heart attack Father    Diabetes Paternal Uncle    Diabetes Paternal Uncle  Past Surgical History:  Procedure Laterality Date   APPLICATION OF WOUND VAC Left 12/17/2022   Procedure: APPLICATION OF WOUND VAC;  Surgeon: [name], MD;  Location: [facility];  Service: Orthopedics;  Laterality: Left;   CERVICAL CONIZATION W/BX  1985   DILATION AND CURETTAGE OF UTERUS     ECTOPIC PREGNANCY SURGERY     patient stated she had to have tube removed   HARDWARE REMOVAL Left 09/17/2022   Procedure: LEFT ELBOW HARDWARE REMOVAL;  Surgeon: Leandrew Koyanagi, MD;  [facility];  Service: Orthopedics;  Laterality: Left;   I & D EXTREMITY Left 05/12/2022   Procedure: IRRIGATION AND DEBRIDEMENT LEFT ELBOW;  Surgeon: Leandrew Koyanagi, MD;  Location: [facility];  Service: Orthopedics;  Laterality: Left;   I & D EXTREMITY Left 07/18/2022   Procedure: LEFT ELBOW IRRIGATION AND DEBRIDEMENT EXTREMITY;  Surgeon: Leandrew Koyanagi, MD;  Location: [facility];  Service: Orthopedics;  Laterality: Left;   I & D EXTREMITY Left 10/24/2022   Procedure: LEFT ELBOW DEBRIDEMENT;  Surgeon: [name], MD;  Location: [facility];  Service: Orthopedics;  Laterality: Left;   I & D EXTREMITY Left 10/29/2022   Procedure: LEFT ELBOW DEBRIDEMENT;  Surgeon: [name], MD;  Location: [facility];  Service: Orthopedics;  Laterality: Left;   I & D EXTREMITY Left 12/17/2022   Procedure: LEFT ELBOW DEBRIDEMENT;  Surgeon: [name], MD;  Location: [facility];  Service: Orthopedics;  Laterality: Left;   IRRIGATION AND  DEBRIDEMENT ELBOW Left 09/17/2022   Procedure: LEFT IRRIGATION AND DEBRIDEMENT ELBOW;  Surgeon: Leandrew Koyanagi, MD;  Location[facility];  Service: Orthopedics;  Laterality: Left;   TONSILLECTOMY     TRICEPS TENDON REPAIR Left 02/07/2022   Procedure: left triceps repair;  Surgeon: Leandrew Koyanagi, MD;  Location: [facility];  Service: Orthopedics;  Laterality: Left;   Social History   Occupational History   Not on file  Tobacco Use   Smoking status: Former    Packs/day: 0.10    Years: 35.00    Additional pack years: 0.00    Total pack years: 3.50    Types: Cigarettes    Quit date: 11/16/2022    Years since quitting: 0.3    Passive exposure: Never   Smokeless tobacco: Never   Tobacco comments:    Smokes 2 cigarettes/day  Vaping Use   Vaping Use: Never used  Substance and Sexual Activity   Alcohol use: Not Currently   Drug use: Not Currently    Types: Marijuana    Comment: 10/23/22- has not used since 2019   Sexual activity: Yes    Birth control/protection: Post-menopausal

## 2023-03-25 ENCOUNTER — Other Ambulatory Visit: Payer: Self-pay | Admitting: Physician Assistant

## 2023-03-25 DIAGNOSIS — J9690 Respiratory failure, unspecified, unspecified whether with hypoxia or hypercapnia: Secondary | ICD-10-CM | POA: Diagnosis not present

## 2023-03-31 ENCOUNTER — Other Ambulatory Visit: Payer: Self-pay | Admitting: Physician Assistant

## 2023-03-31 ENCOUNTER — Ambulatory Visit: Payer: 59 | Admitting: Orthopedic Surgery

## 2023-03-31 DIAGNOSIS — I10 Essential (primary) hypertension: Secondary | ICD-10-CM

## 2023-03-31 NOTE — Telephone Encounter (Signed)
Meds ordered this encounter  Medications   atenolol (TENORMIN) 50 MG tablet    Sig: Take 1 tablet by mouth once daily    Dispense:  30 tablet    Refill:  0    Needs appt.   Pt need diabetic and BP f/u appt with PCP

## 2023-03-31 NOTE — Telephone Encounter (Signed)
Routing to covering provider.  Patient is currently on amlodipine and hctz. Last visit with pcp stated "Hypotension, unspecified hypotension type". Is the refill for atenolol appropriate? Please advise, thanks.

## 2023-04-06 ENCOUNTER — Encounter: Payer: Self-pay | Admitting: *Deleted

## 2023-04-06 ENCOUNTER — Encounter: Payer: Self-pay | Admitting: Physician Assistant

## 2023-04-06 NOTE — Telephone Encounter (Signed)
I have faxed medical records form to [facility] [phone]

## 2023-04-14 ENCOUNTER — Other Ambulatory Visit: Payer: Self-pay | Admitting: Physician Assistant

## 2023-04-14 ENCOUNTER — Ambulatory Visit: Payer: 59 | Admitting: Orthopedic Surgery

## 2023-04-14 DIAGNOSIS — S51002A Unspecified open wound of left elbow, initial encounter: Secondary | ICD-10-CM

## 2023-04-14 DIAGNOSIS — F5101 Primary insomnia: Secondary | ICD-10-CM

## 2023-04-14 DIAGNOSIS — S46312A Strain of muscle, fascia and tendon of triceps, left arm, initial encounter: Secondary | ICD-10-CM | POA: Diagnosis not present

## 2023-04-14 DIAGNOSIS — F40240 Claustrophobia: Secondary | ICD-10-CM

## 2023-04-17 ENCOUNTER — Other Ambulatory Visit: Payer: Self-pay | Admitting: Physician Assistant

## 2023-04-17 DIAGNOSIS — G8929 Other chronic pain: Secondary | ICD-10-CM

## 2023-04-17 DIAGNOSIS — M5136 Other intervertebral disc degeneration, lumbar region: Secondary | ICD-10-CM

## 2023-04-17 DIAGNOSIS — G629 Polyneuropathy, unspecified: Secondary | ICD-10-CM

## 2023-04-19 ENCOUNTER — Encounter: Payer: Self-pay | Admitting: Orthopedic Surgery

## 2023-04-19 ENCOUNTER — Other Ambulatory Visit: Payer: Self-pay | Admitting: Physician Assistant

## 2023-04-19 NOTE — Progress Notes (Signed)
Office Visit Note   Patient: Kathleen Le           Date of Birth: 1961/08/11           MRN: [id number] Visit Date: 04/14/2023              Requested by: Jomarie Longs, PA-C [address] [place],  [place] [address] PCP: Jomarie Longs, [place]  Chief Complaint  Patient presents with   Left Elbow - Follow-up    12/17/2022 left elbow debridement kerecis graft 38 micro      HPI: Patient is a 62 year old woman status post serial debridement for left elbow wound status post quad tendon reconstruction.  Assessment & Plan: Visit Diagnoses:  1. Traumatic rupture of left triceps tendon, initial encounter   2. Elbow wound, left, initial encounter     Plan: Continue with current wound care.  Follow-Up Instructions: Return in about 4 weeks (around 05/12/2023).   Ortho Exam  Patient is alert, oriented, no adenopathy, well-dressed, normal affect, normal respiratory effort. Examination the wound bed has fibrinous exudative tissue this was debrided and there was 100% healthy bleeding granulation tissue after debridement the wound measures 1 x 0.5 cm and 1 mm deep.  There is no cellulitis odor or drainage.  Imaging: No results found.   Labs: Lab Results  Component Value Date   HGBA1C 7.8 (H) 02/02/2023   HGBA1C 9.0 (A) 01/23/2023   HGBA1C 7.3 (A) 05/06/2022   ESRSEDRATE 45 (H) 11/07/2022   ESRSEDRATE 32 (H) 10/24/2022   ESRSEDRATE 20 09/17/2022   CRP 0.7 11/07/2022   CRP <0.5 10/24/2022   REPTSTATUS 12/22/2022 FINAL 12/17/2022   GRAMSTAIN NO WBC SEEN NO ORGANISMS SEEN  12/17/2022   CULT  12/17/2022    RARE DIPHTHEROIDS(CORYNEBACTERIUM SPECIES) Standardized susceptibility testing for this organism is not available. NO ANAEROBES ISOLATED Performed at [facility], [address], [place], [place] [address]    LABORGA NO GROWTH 01/24/2015     Lab Results  Component Value Date   ALBUMIN 3.2 (L) 11/07/2022   ALBUMIN 3.8 08/26/2017   ALBUMIN 4.0  06/25/2016   PREALBUMIN 24 09/25/2022    No results found for: "MG" No results found for: "VD25OH"  Lab Results  Component Value Date   PREALBUMIN 24 09/25/2022      Latest Ref Rng & Units 12/17/2022   11:35 AM 11/07/2022   12:30 PM 10/28/2022    5:44 AM  CBC EXTENDED  WBC 4.0 - 10.5 K/uL 6.0  5.7  4.8   RBC 3.87 - 5.11 MIL/uL 3.67  3.78  3.64   Hemoglobin 12.0 - 15.0 g/dL 82.9  56.2  13.0   HCT 36.0 - 46.0 % 32.8  35.2  33.1   Platelets 150 - 400 K/uL 289  348  303   NEUT# 1.7 - 7.7 K/uL   2.6   Lymph# 0.7 - 4.0 K/uL   1.4      There is no height or weight on file to calculate BMI.  Orders:  No orders of the defined types were placed in this encounter.  No orders of the defined types were placed in this encounter.    Procedures: No procedures performed  Clinical Data: No additional findings.  ROS:  All other systems negative, except as noted in the HPI. Review of Systems  Objective: Vital Signs: There were no vitals taken for this visit.  Specialty Comments:  No specialty comments available.  PMFS History:  Patient Active Problem List   Diagnosis Date Noted   Hypotension 01/23/2023   Tobacco abuse 01/16/2023   Chronic respiratory failure with hypoxia 01/16/2023   Septic bursitis of elbow 10/24/2022   COPD exacerbation 09/02/2022   Bursitis of elbow 05/28/2022   Elbow wound, left, sequela 05/11/2022   Centrilobular emphysema 05/09/2022   Triceps tendon rupture, left, initial encounter 02/07/2022   Fall 12/31/2021   Contusion of left arm 12/31/2021   Acute left-sided low back pain without sciatica 12/31/2021   Neck pain 12/31/2021   Encounter for smoking cessation counseling 11/15/2021   Chronic obstructive pulmonary disease 02/26/2021   Hyperlipidemia associated with type 2 diabetes mellitus 11/05/2020   Non-restorative sleep 11/05/2020   Snoring 11/05/2020   Anterolisthesis 07/30/2020   Acute stress reaction 09/12/2018   Current smoker  06/08/2018   Diabetes mellitus, type II, insulin dependent 06/08/2018   Claustrophobia 06/08/2018   Primary insomnia 06/08/2018   Hot flashes 06/08/2018   Abnormal weight gain 06/08/2018   Lower leg edema 11/10/2017   Insomnia due to other mental disorder 10/27/2017   History of substance abuse 10/27/2017   GAD (generalized anxiety disorder) 10/27/2017   Class 3 severe obesity due to excess calories with serious comorbidity and body mass index (BMI) of 45.0 to 49.9 in adult 08/31/2017   Cerumen impaction 01/24/2015   Hyperlipidemia 01/24/2015   Gastroesophageal reflux disease with esophagitis 01/10/2015   Foot callus 10/03/2014   PTSD (post-traumatic stress disorder) 09/26/2014   Depression 05/19/2014   Anxiety 05/19/2014   Chronic bilateral low back pain with right-sided sciatica 05/19/2014   DDD (degenerative disc disease), lumbar 05/19/2014   Neuropathy 02/05/2014   IT band syndrome 02/05/2014   Sinus tachycardia 02/05/2014   Memory changes 02/05/2014   Word finding difficulty 02/05/2014   Cervical cancer 02/05/2014   Type II diabetes mellitus, uncontrolled 02/03/2014   Unspecified asthma(493.90) 02/03/2014   Essential hypertension, benign 02/03/2014   Pulmonary emphysema (HCC) 07/26/2012   Past Medical History:  Diagnosis Date   Anxiety    Arthritis    Asthma    Cancer    uterine   COPD (chronic obstructive pulmonary disease)    Degenerative disc disease, lumbar    Depression    Diabetes mellitus without complication    Type II   Dyspnea    with exertion due to COPD   GERD (gastroesophageal reflux disease)    History of alcohol abuse    20-30 years ago, patient was going through divorce but does not currently drink any alcohol   History of kidney stones    passed   History of marijuana use    "its been a long time since I last smoked because of my COPD"   History of ovarian cancer 1984   Hypertension    Peripheral neuropathy    Pneumonia    Substance abuse     patient denies, states she has only ever used marijuana   Swelling of both lower extremities     Family History  Problem Relation Age of Onset   Cancer Paternal Grandmother    Diabetes Paternal Grandmother    Diabetes Paternal Grandfather    Heart attack Maternal Aunt    Cancer Maternal Uncle    Cancer Maternal Grandmother    Heart attack Mother    Cancer Mother    Stroke Mother    Diabetes Father    Heart attack Father    Diabetes Paternal Uncle    Diabetes Paternal Uncle  Past Surgical History:  Procedure Laterality Date   APPLICATION OF WOUND VAC Left 12/17/2022   Procedure: APPLICATION OF WOUND VAC;  Surgeon: Nadara Mustard, MD;  Location: [facility] OR;  Service: Orthopedics;  Laterality: Left;   CERVICAL CONIZATION W/BX  1985   DILATION AND CURETTAGE OF UTERUS     ECTOPIC PREGNANCY SURGERY     patient stated she had to have tube removed   HARDWARE REMOVAL Left 09/17/2022   Procedure: LEFT ELBOW HARDWARE REMOVAL;  Surgeon: Tarry Kos, MD;  Location: [facility];  Service: Orthopedics;  Laterality: Left;   I & D EXTREMITY Left 05/12/2022   Procedure: IRRIGATION AND DEBRIDEMENT LEFT ELBOW;  Surgeon: Tarry Kos, MD;  Location: [facility] OR;  Service: Orthopedics;  Laterality: Left;   I & D EXTREMITY Left 07/18/2022   Procedure: LEFT ELBOW IRRIGATION AND DEBRIDEMENT EXTREMITY;  Surgeon: Tarry Kos, MD;  Location: [facility];  Service: Orthopedics;  Laterality: Left;   I & D EXTREMITY Left 10/24/2022   Procedure: LEFT ELBOW DEBRIDEMENT;  Surgeon: Nadara Mustard, MD;  Location: [facility] OR;  Service: Orthopedics;  Laterality: Left;   I & D EXTREMITY Left 10/29/2022   Procedure: LEFT ELBOW DEBRIDEMENT;  Surgeon: Nadara Mustard, MD;  Location: [facility] OR;  Service: Orthopedics;  Laterality: Left;   I & D EXTREMITY Left 12/17/2022   Procedure: LEFT ELBOW DEBRIDEMENT;  Surgeon: Nadara Mustard, MD;  Location: [facility] OR;  Service: Orthopedics;  Laterality: Left;   IRRIGATION AND DEBRIDEMENT  ELBOW Left 09/17/2022   Procedure: LEFT IRRIGATION AND DEBRIDEMENT ELBOW;  Surgeon: Tarry Kos, MD;  Location: [facility];  Service: Orthopedics;  Laterality: Left;   TONSILLECTOMY     TRICEPS TENDON REPAIR Left 02/07/2022   Procedure: left triceps repair;  Surgeon: Tarry Kos, MD;  Location: [facility];  Service: Orthopedics;  Laterality: Left;   Social History   Occupational History   Not on file  Tobacco Use   Smoking status: Former    Packs/day: 0.10    Years: 35.00    Additional pack years: 0.00    Total pack years: 3.50    Types: Cigarettes    Quit date: 11/16/2022    Years since quitting: 0.4    Passive exposure: Never   Smokeless tobacco: Never   Tobacco comments:    Smokes 2 cigarettes/day  Vaping Use   Vaping Use: Never used  Substance and Sexual Activity   Alcohol use: Not Currently   Drug use: Not Currently    Types: Marijuana    Comment: 10/23/22- has not used since 2019   Sexual activity: Yes    Birth control/protection: Post-menopausal

## 2023-04-21 ENCOUNTER — Other Ambulatory Visit: Payer: Self-pay | Admitting: Physician Assistant

## 2023-04-21 DIAGNOSIS — G8929 Other chronic pain: Secondary | ICD-10-CM

## 2023-04-21 DIAGNOSIS — G629 Polyneuropathy, unspecified: Secondary | ICD-10-CM

## 2023-04-21 DIAGNOSIS — M5136 Other intervertebral disc degeneration, lumbar region: Secondary | ICD-10-CM

## 2023-04-24 ENCOUNTER — Ambulatory Visit: Payer: 59 | Admitting: Physician Assistant

## 2023-04-25 DIAGNOSIS — J9690 Respiratory failure, unspecified, unspecified whether with hypoxia or hypercapnia: Secondary | ICD-10-CM | POA: Diagnosis not present

## 2023-04-29 ENCOUNTER — Other Ambulatory Visit: Payer: Self-pay | Admitting: Physician Assistant

## 2023-04-29 DIAGNOSIS — J432 Centrilobular emphysema: Secondary | ICD-10-CM

## 2023-04-29 DIAGNOSIS — J441 Chronic obstructive pulmonary disease with (acute) exacerbation: Secondary | ICD-10-CM

## 2023-04-29 DIAGNOSIS — R0602 Shortness of breath: Secondary | ICD-10-CM

## 2023-05-04 ENCOUNTER — Other Ambulatory Visit: Payer: Self-pay | Admitting: Physician Assistant

## 2023-05-05 ENCOUNTER — Other Ambulatory Visit: Payer: Self-pay | Admitting: Physician Assistant

## 2023-05-05 MED ORDER — NYSTATIN 100000 UNIT/ML MT SUSP: 5.0000 mL | Freq: Four times a day (QID) | OROMUCOSAL | 0 refills | Status: DC

## 2023-05-11 ENCOUNTER — Other Ambulatory Visit: Payer: Self-pay | Admitting: Physician Assistant

## 2023-05-12 ENCOUNTER — Ambulatory Visit: Payer: 59 | Admitting: Orthopedic Surgery

## 2023-05-12 DIAGNOSIS — S51002A Unspecified open wound of left elbow, initial encounter: Secondary | ICD-10-CM

## 2023-05-15 ENCOUNTER — Other Ambulatory Visit: Payer: Self-pay | Admitting: Physician Assistant

## 2023-05-15 DIAGNOSIS — E119 Type 2 diabetes mellitus without complications: Secondary | ICD-10-CM

## 2023-05-19 ENCOUNTER — Ambulatory Visit (INDEPENDENT_AMBULATORY_CARE_PROVIDER_SITE_OTHER): Payer: 59 | Admitting: Physician Assistant

## 2023-05-19 VITALS — BP 160/83 | HR 83 | Ht 66.0 in | Wt 276.0 lb

## 2023-05-19 DIAGNOSIS — Z794 Long term (current) use of insulin: Secondary | ICD-10-CM

## 2023-05-19 DIAGNOSIS — I10 Essential (primary) hypertension: Secondary | ICD-10-CM

## 2023-05-19 DIAGNOSIS — E1165 Type 2 diabetes mellitus with hyperglycemia: Secondary | ICD-10-CM

## 2023-05-19 DIAGNOSIS — E162 Hypoglycemia, unspecified: Secondary | ICD-10-CM | POA: Diagnosis not present

## 2023-05-19 DIAGNOSIS — E119 Type 2 diabetes mellitus without complications: Secondary | ICD-10-CM

## 2023-05-19 DIAGNOSIS — L820 Inflamed seborrheic keratosis: Secondary | ICD-10-CM | POA: Diagnosis not present

## 2023-05-19 DIAGNOSIS — E161 Other hypoglycemia: Secondary | ICD-10-CM

## 2023-05-19 LAB — POCT UA - MICROALBUMIN
Creatinine, POC: 50 mg/dL
Microalbumin Ur, POC: 30 mg/L

## 2023-05-19 LAB — POCT GLYCOSYLATED HEMOGLOBIN (HGB A1C): Hemoglobin A1C: 7.7 % — AB (ref 4.0–5.6)

## 2023-05-19 MED ORDER — PIOGLITAZONE HCL 45 MG PO TABS: 45.0000 mg | ORAL_TABLET | Freq: Every day | ORAL | 0 refills | Status: DC

## 2023-05-19 MED ORDER — AMLODIPINE BESYLATE 2.5 MG PO TABS
2.5000 mg | ORAL_TABLET | Freq: Every day | ORAL | 1 refills | Status: DC
Start: 2023-05-19 — End: 2023-12-18

## 2023-05-19 MED ORDER — SITAGLIPTIN PHOSPHATE 100 MG PO TABS
100.0000 mg | ORAL_TABLET | Freq: Every day | ORAL | 0 refills | Status: DC
Start: 2023-05-19 — End: 2023-08-19

## 2023-05-19 MED ORDER — HYDROCHLOROTHIAZIDE 25 MG PO TABS
25.0000 mg | ORAL_TABLET | Freq: Every day | ORAL | 1 refills | Status: DC
Start: 2023-05-19 — End: 2023-11-06

## 2023-05-19 MED ORDER — ATENOLOL 50 MG PO TABS: 50.0000 mg | ORAL_TABLET | Freq: Every day | ORAL | 1 refills | Status: DC

## 2023-05-19 MED ORDER — METFORMIN HCL 1000 MG PO TABS: 1000.0000 mg | ORAL_TABLET | Freq: Two times a day (BID) | ORAL | 0 refills | Status: DC

## 2023-05-19 NOTE — Progress Notes (Unsigned)
Established Patient Office Visit  Subjective   Patient ID: Kathleen Le, female    DOB: September 19, 1961  Age: 62 y.o. MRN: [id number]  Chief Complaint  Patient presents with   Follow-up    HPI Pt is a 62 yo obese female   Pt has started back smoking. Not quite as much as before but usually daily about 5-6 cigarettes.   She is taking her DM medications daily that consist of Glipizide/januvia/metformin/Actos and insulin at bedtime 10 units. She is getting normal to low sugars in the morning. She has had some lows of 42 at night. She is getting high readings in evening and at night in the 200s. No CP, palpitations, headaches or vision changes. She has a nearly healed wound of left elbow. She has been released from wound care.    She has nevus that she keeps picking at on her left anterior thigh. She would like removed.     Patient Active Problem List   Diagnosis Date Noted   Hypoglycemic reaction 05/20/2023   Seborrheic keratoses 05/20/2023   Hypotension 01/23/2023   Tobacco abuse 01/16/2023   Chronic respiratory failure with hypoxia (HCC) 01/16/2023   Septic bursitis of elbow 10/24/2022   COPD exacerbation (HCC) 09/02/2022   Bursitis of elbow 05/28/2022   Elbow wound, left, sequela 05/11/2022   Centrilobular emphysema (HCC) 05/09/2022   Triceps tendon rupture, left, initial encounter 02/07/2022   Fall 12/31/2021   Contusion of left arm 12/31/2021   Acute left-sided low back pain without sciatica 12/31/2021   Neck pain 12/31/2021   Encounter for smoking cessation counseling 11/15/2021   Chronic obstructive pulmonary disease (HCC) 02/26/2021   Hyperlipidemia associated with type 2 diabetes mellitus (HCC) 11/05/2020   Non-restorative sleep 11/05/2020   Snoring 11/05/2020   Anterolisthesis 07/30/2020   Acute stress reaction 09/12/2018   Current smoker 06/08/2018   Diabetes mellitus, type II, insulin dependent (HCC) 06/08/2018   Claustrophobia 06/08/2018   Primary insomnia  06/08/2018   Hot flashes 06/08/2018   Abnormal weight gain 06/08/2018   Lower leg edema 11/10/2017   Insomnia due to other mental disorder 10/27/2017   History of substance abuse (HCC) 10/27/2017   GAD (generalized anxiety disorder) 10/27/2017   Class 3 severe obesity due to excess calories with serious comorbidity and body mass index (BMI) of 45.0 to 49.9 in adult (HCC) 08/31/2017   Cerumen impaction 01/24/2015   Hyperlipidemia 01/24/2015   Gastroesophageal reflux disease with esophagitis 01/10/2015   Foot callus 10/03/2014   PTSD (post-traumatic stress disorder) 09/26/2014   Depression 05/19/2014   Anxiety 05/19/2014   Chronic bilateral low back pain with right-sided sciatica 05/19/2014   DDD (degenerative disc disease), lumbar 05/19/2014   Neuropathy 02/05/2014   IT band syndrome 02/05/2014   Sinus tachycardia 02/05/2014   Memory changes 02/05/2014   Word finding difficulty 02/05/2014   Cervical cancer (HCC) 02/05/2014   Type II diabetes mellitus, uncontrolled 02/03/2014   Unspecified asthma(493.90) 02/03/2014   Essential hypertension, benign 02/03/2014   Pulmonary emphysema (HCC) 07/26/2012   Past Medical History:  Diagnosis Date   Anxiety    Arthritis    Asthma    Cancer (HCC)    uterine   COPD (chronic obstructive pulmonary disease) (HCC)    Degenerative disc disease, lumbar    Depression    Diabetes mellitus without complication (HCC)    Type II   Dyspnea    with exertion due to COPD   GERD (gastroesophageal reflux disease)    History of  alcohol abuse    20-30 years ago, patient was going through divorce but does not currently drink any alcohol   History of kidney stones    passed   History of marijuana use    "its been a long time since I last smoked because of my COPD"   History of ovarian cancer 1984   Hypertension    Peripheral neuropathy    Pneumonia    Substance abuse (HCC)    patient denies, states she has only ever used marijuana   Swelling of  both lower extremities    Past Surgical History:  Procedure Laterality Date   APPLICATION OF WOUND VAC Left 12/17/2022   Procedure: APPLICATION OF WOUND VAC;  Surgeon: Nadara Mustard, MD;  Location: [facility];  Service: Orthopedics;  Laterality: Left;   CERVICAL CONIZATION W/BX  1985   DILATION AND CURETTAGE OF UTERUS     ECTOPIC PREGNANCY SURGERY     patient stated she had to have tube removed   HARDWARE REMOVAL Left 09/17/2022   Procedure: LEFT ELBOW HARDWARE REMOVAL;  Surgeon: Tarry Kos, MD;  Location: [facility];  Service: Orthopedics;  Laterality: Left;   I & D EXTREMITY Left 05/12/2022   Procedure: IRRIGATION AND DEBRIDEMENT LEFT ELBOW;  Surgeon: Tarry Kos, MD;  Location: [facility];  Service: Orthopedics;  Laterality: Left;   I & D EXTREMITY Left 07/18/2022   Procedure: LEFT ELBOW IRRIGATION AND DEBRIDEMENT EXTREMITY;  Surgeon: Tarry Kos, MD;  Location: [facility];  Service: Orthopedics;  Laterality: Left;   I & D EXTREMITY Left 10/24/2022   Procedure: LEFT ELBOW DEBRIDEMENT;  Surgeon: Nadara Mustard, MD;  Location: [facility] OR;  Service: Orthopedics;  Laterality: Left;   I & D EXTREMITY Left 10/29/2022   Procedure: LEFT ELBOW DEBRIDEMENT;  Surgeon: Nadara Mustard, MD;  Location: [facility] OR;  Service: Orthopedics;  Laterality: Left;   I & D EXTREMITY Left 12/17/2022   Procedure: LEFT ELBOW DEBRIDEMENT;  Surgeon: Nadara Mustard, MD;  Location: [facility] OR;  Service: Orthopedics;  Laterality: Left;   IRRIGATION AND DEBRIDEMENT ELBOW Left 09/17/2022   Procedure: LEFT IRRIGATION AND DEBRIDEMENT ELBOW;  Surgeon: Tarry Kos, MD;  Location: [facility];  Service: Orthopedics;  Laterality: Left;   TONSILLECTOMY     TRICEPS TENDON REPAIR Left 02/07/2022   Procedure: left triceps repair;  Surgeon: Tarry Kos, MD;  Location: [facility];  Service: Orthopedics;  Laterality: Left;   Family History  Problem Relation Age of Onset   Cancer Paternal Grandmother    Diabetes Paternal Grandmother    Diabetes  Paternal Grandfather    Heart attack Maternal Aunt    Cancer Maternal Uncle    Cancer Maternal Grandmother    Heart attack Mother    Cancer Mother    Stroke Mother    Diabetes Father    Heart attack Father    Diabetes Paternal Uncle    Diabetes Paternal Uncle    Allergies  Allergen Reactions   Gabapentin Nausea And Vomiting   Invokana [Canagliflozin] Other (See Comments)    Vaginitis    Latex Itching and Rash   Lyrica [Pregabalin] Other (See Comments)    Pain in feet increased and spasm    ROS   See HPI.  Objective:     BP (!) 160/83   Pulse 83   Ht 5\' 6"  (1.676 m)   Wt 276 lb (125.2 kg)   SpO2 99%   BMI 44.55 kg/m  BP Readings from Last 3 Encounters:  05/19/23 (!) 160/83  01/23/23 (!) 117/41  01/16/23 130/60   Wt Readings from Last 3 Encounters:  05/19/23 276 lb (125.2 kg)  01/23/23 291 lb (132 kg)  01/16/23 298 lb 9.6 oz (135.4 kg)    .[name] Results for orders placed or performed in visit on 05/19/23  POCT HgB A1C  Result Value Ref Range   Hemoglobin A1C 7.7 (A) 4.0 - 5.6 %   HbA1c POC (<> result, manual entry)     HbA1c, POC (prediabetic range)     HbA1c, POC (controlled diabetic range)    POCT UA - Microalbumin  Result Value Ref Range   Microalbumin Ur, POC 30 mg/L   Creatinine, POC 50 mg/dL   Albumin/Creatinine Ratio, Urine, POC 30-300      Physical Exam Constitutional:      Appearance: Normal appearance. She is obese.  HENT:     Head: Normocephalic.  Cardiovascular:     Rate and Rhythm: Normal rate and regular rhythm.  Pulmonary:     Effort: Pulmonary effort is normal.     Breath sounds: Normal breath sounds.  Abdominal:     Palpations: Abdomen is soft.  Musculoskeletal:     Cervical back: Normal range of motion and neck supple. No tenderness.     Right lower leg: No edema.     Left lower leg: No edema.  Lymphadenopathy:     Cervical: No cervical adenopathy.  Skin:    Comments: Manson Passey raised wart like papule of anterior left thigh   Neurological:     General: No focal deficit present.     Mental Status: She is alert and oriented to person, place, and time.  Psychiatric:        Mood and Affect: Mood normal.    Cryotherapy Procedure Note  Pre-operative Diagnosis: inflamed SK  Post-operative Diagnosis: same  Locations: left anterior thigh  Indications: inflamed and irritating  Procedure Details  History of allergy to iodine: no. Pacemaker? no.  Patient informed of risks (permanent scarring, infection, light or dark discoloration, bleeding, infection, weakness, numbness and recurrence of the lesion) and benefits of the procedure and verbal informed consent obtained.  The areas are treated with liquid nitrogen therapy, frozen until ice ball extended 2 mm beyond lesion, allowed to thaw, and treated again. The patient tolerated procedure well.  The patient was instructed on post-op care, warned that there may be blister formation, redness and pain. Recommend OTC analgesia as needed for pain.  Condition: Stable  Plan: 1. Instructed to keep the area dry and covered for 24-48h and clean thereafter. 2. Warning signs of infection were reviewed.   3. Recommended that the patient use OTC acetaminophen as needed for pain.     The 10-year ASCVD risk score (Arnett DK, et al., 2019) is: 21.5%    Assessment & Plan:  [name] KitchenMarlene was seen today for follow-up.  Diagnoses and all orders for this visit:  Uncontrolled type 2 diabetes mellitus with hyperglycemia (HCC) -     POCT HgB A1C -     POCT UA - Microalbumin -     sitaGLIPtin (JANUVIA) 100 MG tablet; Take 1 tablet (100 mg total) by mouth daily. -     pioglitazone (ACTOS) 45 MG tablet; Take 1 tablet (45 mg total) by mouth daily. -     Continuous Glucose Sensor (FREESTYLE LIBRE 3 SENSOR) MISC; Place 1 sensor on the skin every 14 days. Use to check glucose continuously due to  hypoglycemic events at night.  Hypoglycemia -     Continuous Glucose Sensor (FREESTYLE LIBRE  3 SENSOR) MISC; Place 1 sensor on the skin every 14 days. Use to check glucose continuously due to hypoglycemic events at night.  Essential hypertension, benign -     atenolol (TENORMIN) 50 MG tablet; Take 1 tablet (50 mg total) by mouth daily. -     hydrochlorothiazide (HYDRODIURIL) 25 MG tablet; Take 1 tablet (25 mg total) by mouth daily. -     amLODipine (NORVASC) 2.5 MG tablet; Take 1 tablet (2.5 mg total) by mouth daily.  Diabetes mellitus, type II, insulin dependent (HCC) -     sitaGLIPtin (JANUVIA) 100 MG tablet; Take 1 tablet (100 mg total) by mouth daily. -     metFORMIN (GLUCOPHAGE) 1000 MG tablet; Take 1 tablet (1,000 mg total) by mouth 2 (two) times daily with a meal.  Inflamed seborrheic keratosis   A1C has improved but not to goal She is reporting lows at night Start 5units of novolin in am and pm then adjust accordingly to get sugars to 90-120 in mornings and 180s at bedtime Sent freestyle libre for CGM and better glucose control Continue on same medications BP not good today but she reports readings 120/80 at home and that she forgot her medication all weekend and today Protein in urine, on ACE On statin Needs eye exam Declined flu, one pneumonia vaccine Declined shingles vaccine and covid Follow up in 3 months  SK irritating and always picking at it Cryotherapy done today Discussed care and to return if needing more freezing  Discussed and encouraged to continue to cut back on smoking cessation.    Tandy Gaw, PA-C

## 2023-05-19 NOTE — Patient Instructions (Addendum)
5 units novolin in morning and at bedtime Will send freestyle libre to start monitoring sugars more closely

## 2023-05-20 ENCOUNTER — Encounter: Payer: Self-pay | Admitting: Physician Assistant

## 2023-05-20 DIAGNOSIS — L821 Other seborrheic keratosis: Secondary | ICD-10-CM

## 2023-05-20 DIAGNOSIS — E161 Other hypoglycemia: Secondary | ICD-10-CM | POA: Insufficient documentation

## 2023-05-20 HISTORY — DX: Other seborrheic keratosis: L82.1

## 2023-05-20 HISTORY — DX: Other hypoglycemia: E16.1

## 2023-05-20 MED ORDER — FREESTYLE LIBRE 3 SENSOR MISC
3 refills | Status: AC
Start: 2023-05-20 — End: ?

## 2023-05-22 ENCOUNTER — Encounter: Payer: Self-pay | Admitting: Orthopedic Surgery

## 2023-05-22 NOTE — Progress Notes (Signed)
Office Visit Note   Patient: Kathleen Le           Date of Birth: 1961-06-18           MRN: [id number] Visit Date: 05/12/2023              Requested by: Jomarie Longs, PA-C [address] [place],  [place] [address] PCP: Jomarie Longs, [place]  Chief Complaint  Patient presents with   Left Elbow - Follow-up    12/17/2022 left elbow debridement kerecis graft 38 micro      HPI: Patient is seen status post debridement olecranon bursal infection.  She is 5 months out from surgery.  Assessment & Plan: Visit Diagnoses:  1. Elbow wound, left, initial encounter     Plan: Continue routine wound care follow-up as needed  Follow-Up Instructions: Return if symptoms worsen or fail to improve.   Ortho Exam  Patient is alert, oriented, no adenopathy, well-dressed, normal affect, normal respiratory effort. Examination the wound has healthy granulation tissue and measures 15 x 3 x 1 mm deep.  There is healthy flat granulation tissue.  Imaging: No results found.   Labs: Lab Results  Component Value Date   HGBA1C 7.7 (A) 05/19/2023   HGBA1C 7.8 (H) 02/02/2023   HGBA1C 9.0 (A) 01/23/2023   ESRSEDRATE 45 (H) 11/07/2022   ESRSEDRATE 32 (H) 10/24/2022   ESRSEDRATE 20 09/17/2022   CRP 0.7 11/07/2022   CRP <0.5 10/24/2022   REPTSTATUS 12/22/2022 FINAL 12/17/2022   GRAMSTAIN NO WBC SEEN NO ORGANISMS SEEN  12/17/2022   CULT  12/17/2022    RARE DIPHTHEROIDS(CORYNEBACTERIUM SPECIES) Standardized susceptibility testing for this organism is not available. NO ANAEROBES ISOLATED Performed at [facility], [address], [place], [place] [address]    LABORGA NO GROWTH 01/24/2015     Lab Results  Component Value Date   ALBUMIN 3.2 (L) 11/07/2022   ALBUMIN 3.8 08/26/2017   ALBUMIN 4.0 06/25/2016   PREALBUMIN 24 09/25/2022    No results found for: "MG" No results found for: "VD25OH"  Lab Results  Component Value Date   PREALBUMIN 24 09/25/2022       Latest Ref Rng & Units 12/17/2022   11:35 AM 11/07/2022   12:30 PM 10/28/2022    5:44 AM  CBC EXTENDED  WBC 4.0 - 10.5 K/uL 6.0  5.7  4.8   RBC 3.87 - 5.11 MIL/uL 3.67  3.78  3.64   Hemoglobin 12.0 - 15.0 g/dL 82.9  56.2  13.0   HCT 36.0 - 46.0 % 32.8  35.2  33.1   Platelets 150 - 400 K/uL 289  348  303   NEUT# 1.7 - 7.7 K/uL   2.6   Lymph# 0.7 - 4.0 K/uL   1.4      There is no height or weight on file to calculate BMI.  Orders:  No orders of the defined types were placed in this encounter.  No orders of the defined types were placed in this encounter.    Procedures: No procedures performed  Clinical Data: No additional findings.  ROS:  All other systems negative, except as noted in the HPI. Review of Systems  Objective: Vital Signs: There were no vitals taken for this visit.  Specialty Comments:  No specialty comments available.  PMFS History: Patient Active Problem List   Diagnosis Date Noted   Hypoglycemic reaction 05/20/2023   Seborrheic keratoses 05/20/2023   Hypotension 01/23/2023   Tobacco  abuse 01/16/2023   Chronic respiratory failure with hypoxia (HCC) 01/16/2023   Septic bursitis of elbow 10/24/2022   COPD exacerbation (HCC) 09/02/2022   Bursitis of elbow 05/28/2022   Elbow wound, left, sequela 05/11/2022   Centrilobular emphysema (HCC) 05/09/2022   Triceps tendon rupture, left, initial encounter 02/07/2022   Fall 12/31/2021   Contusion of left arm 12/31/2021   Acute left-sided low back pain without sciatica 12/31/2021   Neck pain 12/31/2021   Encounter for smoking cessation counseling 11/15/2021   Chronic obstructive pulmonary disease (HCC) 02/26/2021   Hyperlipidemia associated with type 2 diabetes mellitus (HCC) 11/05/2020   Non-restorative sleep 11/05/2020   Snoring 11/05/2020   Anterolisthesis 07/30/2020   Acute stress reaction 09/12/2018   Current smoker 06/08/2018   Diabetes mellitus, type II, insulin dependent (HCC)  06/08/2018   Claustrophobia 06/08/2018   Primary insomnia 06/08/2018   Hot flashes 06/08/2018   Abnormal weight gain 06/08/2018   Lower leg edema 11/10/2017   Insomnia due to other mental disorder 10/27/2017   History of substance abuse (HCC) 10/27/2017   GAD (generalized anxiety disorder) 10/27/2017   Class 3 severe obesity due to excess calories with serious comorbidity and body mass index (BMI) of 45.0 to 49.9 in adult (HCC) 08/31/2017   Cerumen impaction 01/24/2015   Hyperlipidemia 01/24/2015   Gastroesophageal reflux disease with esophagitis 01/10/2015   Foot callus 10/03/2014   PTSD (post-traumatic stress disorder) 09/26/2014   Depression 05/19/2014   Anxiety 05/19/2014   Chronic bilateral low back pain with right-sided sciatica 05/19/2014   DDD (degenerative disc disease), lumbar 05/19/2014   Neuropathy 02/05/2014   IT band syndrome 02/05/2014   Sinus tachycardia 02/05/2014   Memory changes 02/05/2014   Word finding difficulty 02/05/2014   Cervical cancer (HCC) 02/05/2014   Type II diabetes mellitus, uncontrolled 02/03/2014   Unspecified asthma(493.90) 02/03/2014   Essential hypertension, benign 02/03/2014   Pulmonary emphysema (HCC) 07/26/2012   Past Medical History:  Diagnosis Date   Anxiety    Arthritis    Asthma    Cancer (HCC)    uterine   COPD (chronic obstructive pulmonary disease) (HCC)    Degenerative disc disease, lumbar    Depression    Diabetes mellitus without complication (HCC)    Type II   Dyspnea    with exertion due to COPD   GERD (gastroesophageal reflux disease)    History of alcohol abuse    20-30 years ago, patient was going through divorce but does not currently drink any alcohol   History of kidney stones    passed   History of marijuana use    "its been a long time since I last smoked because of my COPD"   History of ovarian cancer 1984   Hypertension    Peripheral neuropathy    Pneumonia    Substance abuse (HCC)    patient  denies, states she has only ever used marijuana   Swelling of both lower extremities     Family History  Problem Relation Age of Onset   Cancer Paternal Grandmother    Diabetes Paternal Grandmother    Diabetes Paternal Grandfather    Heart attack Maternal Aunt    Cancer Maternal Uncle    Cancer Maternal Grandmother    Heart attack Mother    Cancer Mother    Stroke Mother    Diabetes Father    Heart attack Father    Diabetes Paternal Uncle    Diabetes Paternal Uncle     Past  Surgical History:  Procedure Laterality Date   APPLICATION OF WOUND VAC Left 12/17/2022   Procedure: APPLICATION OF WOUND VAC;  Surgeon: Nadara Mustard, MD;  Location: [facility] OR;  Service: Orthopedics;  Laterality: Left;   CERVICAL CONIZATION W/BX  1985   DILATION AND CURETTAGE OF UTERUS     ECTOPIC PREGNANCY SURGERY     patient stated she had to have tube removed   HARDWARE REMOVAL Left 09/17/2022   Procedure: LEFT ELBOW HARDWARE REMOVAL;  Surgeon: Tarry Kos, MD;  Location: [facility];  Service: Orthopedics;  Laterality: Left;   I & D EXTREMITY Left 05/12/2022   Procedure: IRRIGATION AND DEBRIDEMENT LEFT ELBOW;  Surgeon: Tarry Kos, MD;  Location: [facility] OR;  Service: Orthopedics;  Laterality: Left;   I & D EXTREMITY Left 07/18/2022   Procedure: LEFT ELBOW IRRIGATION AND DEBRIDEMENT EXTREMITY;  Surgeon: Tarry Kos, MD;  Location: [facility];  Service: Orthopedics;  Laterality: Left;   I & D EXTREMITY Left 10/24/2022   Procedure: LEFT ELBOW DEBRIDEMENT;  Surgeon: Nadara Mustard, MD;  Location: [facility] OR;  Service: Orthopedics;  Laterality: Left;   I & D EXTREMITY Left 10/29/2022   Procedure: LEFT ELBOW DEBRIDEMENT;  Surgeon: Nadara Mustard, MD;  Location: [facility] OR;  Service: Orthopedics;  Laterality: Left;   I & D EXTREMITY Left 12/17/2022   Procedure: LEFT ELBOW DEBRIDEMENT;  Surgeon: Nadara Mustard, MD;  Location: [facility] OR;  Service: Orthopedics;  Laterality: Left;   IRRIGATION AND DEBRIDEMENT ELBOW Left  09/17/2022   Procedure: LEFT IRRIGATION AND DEBRIDEMENT ELBOW;  Surgeon: Tarry Kos, MD;  Location: [facility];  Service: Orthopedics;  Laterality: Left;   TONSILLECTOMY     TRICEPS TENDON REPAIR Left 02/07/2022   Procedure: left triceps repair;  Surgeon: Tarry Kos, MD;  Location: [facility] OR;  Service: Orthopedics;  Laterality: Left;   Social History   Occupational History   Not on file  Tobacco Use   Smoking status: Every Day    Packs/day: 0.10    Years: 35.00    Additional pack years: 0.00    Total pack years: 3.50    Types: Cigarettes    Last attempt to quit: 11/16/2022    Years since quitting: 0.5    Passive exposure: Never   Smokeless tobacco: Never   Tobacco comments:    Smokes 2 cigarettes/day  Vaping Use   Vaping Use: Never used  Substance and Sexual Activity   Alcohol use: Not Currently   Drug use: Not Currently    Types: Marijuana    Comment: 10/23/22- has not used since 2019   Sexual activity: Yes    Birth control/protection: Post-menopausal

## 2023-05-25 DIAGNOSIS — J9690 Respiratory failure, unspecified, unspecified whether with hypoxia or hypercapnia: Secondary | ICD-10-CM | POA: Diagnosis not present

## 2023-06-01 ENCOUNTER — Other Ambulatory Visit: Payer: Self-pay | Admitting: Physician Assistant

## 2023-06-16 ENCOUNTER — Encounter: Payer: Self-pay | Admitting: Physician Assistant

## 2023-06-21 ENCOUNTER — Other Ambulatory Visit: Payer: Self-pay | Admitting: Physician Assistant

## 2023-06-25 DIAGNOSIS — J9690 Respiratory failure, unspecified, unspecified whether with hypoxia or hypercapnia: Secondary | ICD-10-CM | POA: Diagnosis not present

## 2023-07-11 ENCOUNTER — Other Ambulatory Visit: Payer: Self-pay | Admitting: Physician Assistant

## 2023-07-11 DIAGNOSIS — F5101 Primary insomnia: Secondary | ICD-10-CM

## 2023-07-11 DIAGNOSIS — F40240 Claustrophobia: Secondary | ICD-10-CM

## 2023-07-12 ENCOUNTER — Other Ambulatory Visit: Payer: Self-pay | Admitting: Physician Assistant

## 2023-07-17 ENCOUNTER — Other Ambulatory Visit: Payer: Self-pay | Admitting: Physician Assistant

## 2023-07-17 DIAGNOSIS — G629 Polyneuropathy, unspecified: Secondary | ICD-10-CM

## 2023-07-17 DIAGNOSIS — G8929 Other chronic pain: Secondary | ICD-10-CM

## 2023-07-17 DIAGNOSIS — M5136 Other intervertebral disc degeneration, lumbar region: Secondary | ICD-10-CM

## 2023-07-23 ENCOUNTER — Other Ambulatory Visit: Payer: Self-pay | Admitting: Physician Assistant

## 2023-07-25 DIAGNOSIS — J9690 Respiratory failure, unspecified, unspecified whether with hypoxia or hypercapnia: Secondary | ICD-10-CM | POA: Diagnosis not present

## 2023-08-03 ENCOUNTER — Other Ambulatory Visit: Payer: Self-pay | Admitting: Physician Assistant

## 2023-08-07 ENCOUNTER — Other Ambulatory Visit: Payer: Self-pay | Admitting: Physician Assistant

## 2023-08-07 DIAGNOSIS — E1165 Type 2 diabetes mellitus with hyperglycemia: Secondary | ICD-10-CM

## 2023-08-07 DIAGNOSIS — E119 Type 2 diabetes mellitus without complications: Secondary | ICD-10-CM

## 2023-08-10 ENCOUNTER — Other Ambulatory Visit: Payer: Self-pay | Admitting: Physician Assistant

## 2023-08-10 DIAGNOSIS — J432 Centrilobular emphysema: Secondary | ICD-10-CM

## 2023-08-10 DIAGNOSIS — J441 Chronic obstructive pulmonary disease with (acute) exacerbation: Secondary | ICD-10-CM

## 2023-08-10 DIAGNOSIS — R0602 Shortness of breath: Secondary | ICD-10-CM

## 2023-08-15 ENCOUNTER — Other Ambulatory Visit: Payer: Self-pay | Admitting: Physician Assistant

## 2023-08-19 ENCOUNTER — Ambulatory Visit: Payer: 59 | Admitting: Physician Assistant

## 2023-08-19 ENCOUNTER — Encounter: Payer: Self-pay | Admitting: Physician Assistant

## 2023-08-19 VITALS — BP 150/63 | HR 78 | Ht 66.0 in | Wt 288.0 lb

## 2023-08-19 DIAGNOSIS — J432 Centrilobular emphysema: Secondary | ICD-10-CM | POA: Diagnosis not present

## 2023-08-19 DIAGNOSIS — Z6841 Body Mass Index (BMI) 40.0 and over, adult: Secondary | ICD-10-CM

## 2023-08-19 DIAGNOSIS — E119 Type 2 diabetes mellitus without complications: Secondary | ICD-10-CM

## 2023-08-19 DIAGNOSIS — F331 Major depressive disorder, recurrent, moderate: Secondary | ICD-10-CM

## 2023-08-19 DIAGNOSIS — E785 Hyperlipidemia, unspecified: Secondary | ICD-10-CM | POA: Diagnosis not present

## 2023-08-19 DIAGNOSIS — E1165 Type 2 diabetes mellitus with hyperglycemia: Secondary | ICD-10-CM | POA: Diagnosis not present

## 2023-08-19 DIAGNOSIS — F17200 Nicotine dependence, unspecified, uncomplicated: Secondary | ICD-10-CM | POA: Diagnosis not present

## 2023-08-19 DIAGNOSIS — I10 Essential (primary) hypertension: Secondary | ICD-10-CM

## 2023-08-19 DIAGNOSIS — Z794 Long term (current) use of insulin: Secondary | ICD-10-CM | POA: Diagnosis not present

## 2023-08-19 LAB — POCT GLYCOSYLATED HEMOGLOBIN (HGB A1C): Hemoglobin A1C: 7.4 % — AB (ref 4.0–5.6)

## 2023-08-19 MED ORDER — SITAGLIPTIN PHOSPHATE 100 MG PO TABS
100.0000 mg | ORAL_TABLET | Freq: Every day | ORAL | 1 refills | Status: DC
Start: 2023-08-19 — End: 2024-02-12

## 2023-08-19 MED ORDER — GLIPIZIDE 10 MG PO TABS
10.0000 mg | ORAL_TABLET | Freq: Two times a day (BID) | ORAL | 1 refills | Status: DC
Start: 2023-08-19 — End: 2024-02-12

## 2023-08-19 NOTE — Progress Notes (Signed)
Established Patient Office Visit  Subjective   Patient ID: Kathleen Le, female    DOB: 06/21/1961  Age: 62 y.o. MRN: [id number]  Chief Complaint  Patient presents with   Medical Management of Chronic Issues    Last A1c 7.7    HPI Pt is a 62 yo obese female with T2DM, HTN, HLD, COPD who presents to the clinic for follow up.   She admits she has started back smoking but reports being down again to 5 per day.   Her breathing is pretty good and controlled on Trelegy right now. She has duoneb at home for exacerbations.   She is checking her sugars and 140s in the morning and goes to 200s in afternoons and evenings. No hypoglycemic events. No open sores and wounds. She is taking novolin, metformin, glipizide, Actos. She is not active. She denies any CP, palptations, headaches or vision changes. She is not checking BP at home.   Mood is good. No concerns.   .. Active Ambulatory Problems    Diagnosis Date Noted   Type II diabetes mellitus, uncontrolled 02/03/2014   Unspecified asthma(493.90) 02/03/2014   Essential hypertension, benign 02/03/2014   Neuropathy 02/05/2014   IT band syndrome 02/05/2014   Sinus tachycardia 02/05/2014   Memory changes 02/05/2014   Word finding difficulty 02/05/2014   Cervical cancer (HCC) 02/05/2014   Depression 05/19/2014   Anxiety 05/19/2014   Chronic bilateral low back pain with right-sided sciatica 05/19/2014   DDD (degenerative disc disease), lumbar 05/19/2014   PTSD (post-traumatic stress disorder) 09/26/2014   Foot callus 10/03/2014   Gastroesophageal reflux disease with esophagitis 01/10/2015   Cerumen impaction 01/24/2015   Hyperlipidemia 01/24/2015   Pulmonary emphysema (HCC) 07/26/2012   Class 3 severe obesity due to excess calories with serious comorbidity and body mass index (BMI) of 45.0 to 49.9 in adult (HCC) 08/31/2017   Insomnia due to other mental disorder 10/27/2017   History of substance abuse (HCC) 10/27/2017   GAD  (generalized anxiety disorder) 10/27/2017   Lower leg edema 11/10/2017   Current smoker 06/08/2018   Diabetes mellitus, type II, insulin dependent (HCC) 06/08/2018   Claustrophobia 06/08/2018   Primary insomnia 06/08/2018   Hot flashes 06/08/2018   Abnormal weight gain 06/08/2018   Acute stress reaction 09/12/2018   Anterolisthesis 07/30/2020   Hyperlipidemia associated with type 2 diabetes mellitus (HCC) 11/05/2020   Non-restorative sleep 11/05/2020   Snoring 11/05/2020   Chronic obstructive pulmonary disease (HCC) 02/26/2021   Encounter for smoking cessation counseling 11/15/2021   Fall 12/31/2021   Contusion of left arm 12/31/2021   Acute left-sided low back pain without sciatica 12/31/2021   Neck pain 12/31/2021   Triceps tendon rupture, left, initial encounter 02/07/2022   Centrilobular emphysema (HCC) 05/09/2022   Elbow wound, left, sequela 05/11/2022   Bursitis of elbow 05/28/2022   COPD exacerbation (HCC) 09/02/2022   Septic bursitis of elbow 10/24/2022   Tobacco abuse 01/16/2023   Chronic respiratory failure with hypoxia (HCC) 01/16/2023   Hypotension 01/23/2023   Hypoglycemic reaction 05/20/2023   Seborrheic keratoses 05/20/2023   Dyslipidemia, goal LDL below 70 08/21/2023   Moderate episode of recurrent major depressive disorder (HCC) 08/21/2023   Resolved Ambulatory Problems    Diagnosis Date Noted   Bruised rib 09/26/2014   Bruised ribs 09/26/2014   Wound of right leg 09/26/2014   Essential hypertension 11/10/2017   Former smoker 03/09/2018   Past Medical History:  Diagnosis Date   Arthritis    Asthma  Cancer (HCC)    COPD (chronic obstructive pulmonary disease) (HCC)    Degenerative disc disease, lumbar    Diabetes mellitus without complication (HCC)    Dyspnea    GERD (gastroesophageal reflux disease)    History of alcohol abuse    History of kidney stones    History of marijuana use    History of ovarian cancer 1984   Hypertension     Peripheral neuropathy    Pneumonia    Substance abuse (HCC)    Swelling of both lower extremities      ROS See HPI.    Objective:     BP (!) 150/63   Pulse 78   Ht 5\' 6"  (1.676 m)   Wt 288 lb (130.6 kg)   SpO2 99%   BMI 46.48 kg/m  BP Readings from Last 3 Encounters:  08/19/23 (!) 150/63  05/19/23 (!) 160/83  01/23/23 (!) 117/41   Wt Readings from Last 3 Encounters:  08/19/23 288 lb (130.6 kg)  05/19/23 276 lb (125.2 kg)  01/23/23 291 lb (132 kg)      Physical Exam Constitutional:      Appearance: Normal appearance. She is obese.  HENT:     Head: Normocephalic.  Cardiovascular:     Rate and Rhythm: Normal rate and regular rhythm.  Pulmonary:     Effort: Pulmonary effort is normal.  Neurological:     General: No focal deficit present.     Mental Status: She is alert and oriented to person, place, and time.  Psychiatric:        Mood and Affect: Mood normal.      Results for orders placed or performed in visit on 08/19/23  POCT HgB A1C  Result Value Ref Range   Hemoglobin A1C 7.4 (A) 4.0 - 5.6 %   HbA1c POC (<> result, manual entry)     HbA1c, POC (prediabetic range)     HbA1c, POC (controlled diabetic range)      The 10-year ASCVD risk score (Arnett DK, et al., 2019) is: 19.2%    Assessment & Plan:  [name] KitchenBrionnah was seen today for medical management of chronic issues.  Diagnoses and all orders for this visit:  Uncontrolled type 2 diabetes mellitus with hyperglycemia (HCC) -     POCT HgB A1C -     sitaGLIPtin (JANUVIA) 100 MG tablet; Take 1 tablet (100 mg total) by mouth daily.  Diabetes mellitus, type II, insulin dependent (HCC) -     glipiZIDE (GLUCOTROL) 10 MG tablet; Take 1 tablet (10 mg total) by mouth 2 (two) times daily before a meal. -     sitaGLIPtin (JANUVIA) 100 MG tablet; Take 1 tablet (100 mg total) by mouth daily.  Essential hypertension, benign  Centrilobular emphysema (HCC)  Class 3 severe obesity due to excess calories with  serious comorbidity and body mass index (BMI) of 45.0 to 49.9 in adult (HCC)  Dyslipidemia, goal LDL below 70  Current smoker  Moderate episode of recurrent major depressive disorder (HCC)   A1C not to goal Increased novolin to 15 units in morning and 15 units at night Continue all other oral medications Continue to work on exercise a week Continue to keep DM diet BP not to goal increase lisinopril to 10mg   On crestor Eye and foot exam UTD Declines vaccines Continue to work on smoking cessation as one of the BEST things you can do for overall health.   Breathing is stable- continue trelegy.   Mood good  continue on same medications.    Return in about 3 months (around 11/19/2023).    Tandy Gaw, PA-C

## 2023-08-19 NOTE — Patient Instructions (Addendum)
Increase Novolin to 15 units in the morning and 15 units at bedtime Continue to work on smoking cessation Increase lisinopril to 10mg  daily.  Recheck BP in 2 weeks.

## 2023-08-21 DIAGNOSIS — F331 Major depressive disorder, recurrent, moderate: Secondary | ICD-10-CM | POA: Insufficient documentation

## 2023-08-21 DIAGNOSIS — E785 Hyperlipidemia, unspecified: Secondary | ICD-10-CM

## 2023-08-21 HISTORY — DX: Major depressive disorder, recurrent, moderate: F33.1

## 2023-08-21 HISTORY — DX: Hyperlipidemia, unspecified: E78.5

## 2023-08-21 MED ORDER — LISINOPRIL 10 MG PO TABS: 10.0000 mg | ORAL_TABLET | Freq: Every day | ORAL | 0 refills | Status: DC

## 2023-08-25 DIAGNOSIS — J9690 Respiratory failure, unspecified, unspecified whether with hypoxia or hypercapnia: Secondary | ICD-10-CM | POA: Diagnosis not present

## 2023-08-29 ENCOUNTER — Other Ambulatory Visit: Payer: Self-pay | Admitting: Physician Assistant

## 2023-08-29 DIAGNOSIS — I10 Essential (primary) hypertension: Secondary | ICD-10-CM

## 2023-09-03 ENCOUNTER — Encounter: Payer: Self-pay | Admitting: Physician Assistant

## 2023-09-03 ENCOUNTER — Other Ambulatory Visit: Payer: Self-pay | Admitting: Physician Assistant

## 2023-09-04 MED ORDER — ALBUTEROL SULFATE HFA 108 (90 BASE) MCG/ACT IN AERS: 1.0000 | INHALATION_SPRAY | RESPIRATORY_TRACT | 0 refills | Status: DC | PRN

## 2023-09-04 NOTE — Telephone Encounter (Signed)
Reqeusting rx rf of albuterol HFA Last written 12/08/2022 Last OV 08/19/2023 Upcoming appt 11/20/2023

## 2023-09-06 ENCOUNTER — Other Ambulatory Visit: Payer: Self-pay | Admitting: Physician Assistant

## 2023-09-11 ENCOUNTER — Other Ambulatory Visit: Payer: Self-pay | Admitting: Physician Assistant

## 2023-09-13 ENCOUNTER — Other Ambulatory Visit: Payer: Self-pay | Admitting: Physician Assistant

## 2023-09-13 DIAGNOSIS — R0602 Shortness of breath: Secondary | ICD-10-CM

## 2023-09-13 DIAGNOSIS — J441 Chronic obstructive pulmonary disease with (acute) exacerbation: Secondary | ICD-10-CM

## 2023-09-13 DIAGNOSIS — J432 Centrilobular emphysema: Secondary | ICD-10-CM

## 2023-09-22 ENCOUNTER — Other Ambulatory Visit: Payer: Self-pay | Admitting: Physician Assistant

## 2023-09-22 DIAGNOSIS — J432 Centrilobular emphysema: Secondary | ICD-10-CM

## 2023-09-22 DIAGNOSIS — R0602 Shortness of breath: Secondary | ICD-10-CM

## 2023-09-22 DIAGNOSIS — J441 Chronic obstructive pulmonary disease with (acute) exacerbation: Secondary | ICD-10-CM

## 2023-09-23 ENCOUNTER — Encounter: Payer: Self-pay | Admitting: Physician Assistant

## 2023-09-23 ENCOUNTER — Telehealth: Payer: Self-pay

## 2023-09-23 MED ORDER — IPRATROPIUM-ALBUTEROL 0.5-2.5 (3) MG/3ML IN SOLN: 3.0000 mL | Freq: Four times a day (QID) | RESPIRATORY_TRACT | 0 refills | Status: DC | PRN

## 2023-09-23 NOTE — Telephone Encounter (Signed)
Lisinopril was increased to 10mg  - at last visit.  But still having elevated BP readings of  140- 150's systolic .  Do you want her to come in for nurse visit on Friday to evaluate this further?

## 2023-09-23 NOTE — Telephone Encounter (Signed)
Increase to 20mg  daily of lisinopril and change nurse visit to next week for recheck!

## 2023-09-23 NOTE — Telephone Encounter (Signed)
Patient informed. 

## 2023-09-23 NOTE — Telephone Encounter (Signed)
Left detailed voice mail message on patient home # ( allowed on DPR)

## 2023-09-25 DIAGNOSIS — J9690 Respiratory failure, unspecified, unspecified whether with hypoxia or hypercapnia: Secondary | ICD-10-CM | POA: Diagnosis not present

## 2023-09-25 NOTE — Telephone Encounter (Signed)
Okay, stick current dose and keep BP check next week.

## 2023-09-26 ENCOUNTER — Other Ambulatory Visit: Payer: Self-pay | Admitting: Physician Assistant

## 2023-09-28 NOTE — Telephone Encounter (Signed)
Patient informed and will keep nurse visit tomorrow.

## 2023-09-29 ENCOUNTER — Ambulatory Visit (INDEPENDENT_AMBULATORY_CARE_PROVIDER_SITE_OTHER): Payer: 59

## 2023-09-29 VITALS — BP 143/56 | HR 72

## 2023-09-29 DIAGNOSIS — I10 Essential (primary) hypertension: Secondary | ICD-10-CM

## 2023-09-29 MED ORDER — LISINOPRIL 20 MG PO TABS: 20.0000 mg | ORAL_TABLET | Freq: Every day | ORAL | 1 refills | Status: DC

## 2023-09-29 NOTE — Progress Notes (Signed)
Established Patient Office Visit  Subjective   Patient ID: Kathleen Le, female    DOB: 09-Aug-1961  Age: 62 y.o. MRN: [id number]  Chief Complaint  Patient presents with   Hypertension    HPI  Garielle Blake is here for blood pressure check. Denies chest pain or dizziness.   ROS    Objective:     BP (!) 143/56   Pulse 72   SpO2 99%    Physical Exam   No results found for any visits on 09/29/23.    The 10-year ASCVD risk score (Arnett DK, et al., 2019) is: 17.6%    Assessment & Plan:  HTN - Candida advised to increase to lisinopril to 20 mg daily. Follow up in 2 weeks for nurse visit blood pressure check.   Problem List Items Addressed This Visit       Unprioritized   Essential hypertension, benign - Primary   Relevant Medications   lisinopril (ZESTRIL) 20 MG tablet    Return in about 2 weeks (around 10/13/2023) for nurse visit blood pressure check. Earna Coder, Janalyn Harder, CMA

## 2023-10-11 ENCOUNTER — Other Ambulatory Visit: Payer: Self-pay | Admitting: Physician Assistant

## 2023-10-11 DIAGNOSIS — M51369 Other intervertebral disc degeneration, lumbar region without mention of lumbar back pain or lower extremity pain: Secondary | ICD-10-CM

## 2023-10-11 DIAGNOSIS — G8929 Other chronic pain: Secondary | ICD-10-CM

## 2023-10-11 DIAGNOSIS — G629 Polyneuropathy, unspecified: Secondary | ICD-10-CM

## 2023-10-13 ENCOUNTER — Ambulatory Visit: Payer: 59 | Admitting: Physician Assistant

## 2023-10-13 ENCOUNTER — Other Ambulatory Visit: Payer: Self-pay | Admitting: Physician Assistant

## 2023-10-13 ENCOUNTER — Encounter: Payer: Self-pay | Admitting: Physician Assistant

## 2023-10-13 VITALS — BP 136/70 | HR 102 | Ht 66.0 in | Wt 296.0 lb

## 2023-10-13 DIAGNOSIS — R0602 Shortness of breath: Secondary | ICD-10-CM

## 2023-10-13 DIAGNOSIS — J432 Centrilobular emphysema: Secondary | ICD-10-CM

## 2023-10-13 DIAGNOSIS — R911 Solitary pulmonary nodule: Secondary | ICD-10-CM

## 2023-10-13 DIAGNOSIS — J441 Chronic obstructive pulmonary disease with (acute) exacerbation: Secondary | ICD-10-CM

## 2023-10-13 DIAGNOSIS — I10 Essential (primary) hypertension: Secondary | ICD-10-CM | POA: Diagnosis not present

## 2023-10-13 HISTORY — DX: Shortness of breath: R06.02

## 2023-10-13 MED ORDER — LISINOPRIL 20 MG PO TABS: 20.0000 mg | ORAL_TABLET | Freq: Every day | ORAL | 1 refills | Status: DC

## 2023-10-13 MED ORDER — TRELEGY ELLIPTA 100-62.5-25 MCG/ACT IN AEPB
INHALATION_SPRAY | RESPIRATORY_TRACT | 5 refills | Status: DC
Start: 2023-10-13 — End: 2024-04-04

## 2023-10-13 MED ORDER — IPRATROPIUM-ALBUTEROL 0.5-2.5 (3) MG/3ML IN SOLN: 3.0000 mL | Freq: Four times a day (QID) | RESPIRATORY_TRACT | 0 refills | Status: DC | PRN

## 2023-10-13 NOTE — Patient Instructions (Signed)
Chest CT and Echo

## 2023-10-13 NOTE — Progress Notes (Unsigned)
Established Patient Office Visit  Subjective   Patient ID: Kathleen Le, female    DOB: [birth year]  Age: 62 y.o. MRN: [id number]  No chief complaint on file.   HPI 3 mm left lower lob CT chest  Echo of heart Ankle swelling  . Lab Results  Component Value Date   HGBA1C 7.4 (A) 08/19/2023     {History (Optional):23778}  ROS    Objective:     BP 136/70   Pulse (!) 102   Ht 5\' 6"  (1.676 m)   Wt 296 lb (134.3 kg)   SpO2 (!) 88%   BMI 47.78 kg/m  {Vitals History (Optional):23777}  Physical Exam   No results found for any visits on 10/13/23.  {Labs (Optional):23779}  The 10-year ASCVD risk score (Arnett DK, et al., 2019) is: 16%    Assessment & Plan:   Problem List Items Addressed This Visit       Unprioritized   Essential hypertension, benign - Primary    No follow-ups on file.    Tandy Gaw, PA-C

## 2023-10-13 NOTE — Progress Notes (Signed)
Established Patient Office Visit  Subjective   Patient ID: Kathleen Le, female    DOB: Apr 29, 1961  Age: 62 y.o. MRN: [id number]  HPI Penina Soder is a 62 year old female with history of hypertension, hyperlipidemia, COPD, and T2DM here to follow up.   She is using 2L of oxygen at rest occasionally, 3L with movement such as going to the mailbox. She is using her DuoNeb once daily and Trelegy daily that has been helping. Reports sleeping most nights in her recliner and cannot sleep flat on her back anymore. She has an appointment with a pulmonologist in November.   Reports her blood pressure readings at home being in the 130s systolic and in the 60s diastolic. She reports tolerating the lisinopril well and feels as though she is having less light headedness.  Her blood sugars in the morning are around 100 and run 135-145 throughout the day. She reports starting the Align program for her neuropathy and has been helping significantly. She reports the program comes with a diet plan that is low on carbohydrates and sodium that she has been adherent to for the past 3 weeks.   She is smoking 1-3 cigarettes a day, no more than a pack a week. She is trying to go back to not smoking at all and is aware it is best for her health.   ROS See HPI   Objective:     BP 136/70   Pulse (!) 102   Ht 5\' 6"  (1.676 m)   Wt 134.3 kg   SpO2 92% Comment: with no O2 at rest  BMI 47.78 kg/m  BP Readings from Last 3 Encounters:  10/13/23 136/70  09/29/23 (!) 143/56  08/19/23 (!) 150/63   Wt Readings from Last 3 Encounters:  10/13/23 134.3 kg  08/19/23 130.6 kg  05/19/23 125.2 kg      Physical Exam Constitutional:      Appearance: She is obese.  HENT:     Head: Normocephalic and atraumatic.  Cardiovascular:     Rate and Rhythm: Normal rate and regular rhythm.     Heart sounds: Normal heart sounds.  Pulmonary:     Comments: Diminished breath sounds bilaterally in lower lobes. Normal breath  sounds in upper lobes.  Musculoskeletal:     Right ankle: Swelling present.     Left ankle: Swelling present.  Neurological:     General: No focal deficit present.     Mental Status: She is alert and oriented to person, place, and time.  Psychiatric:        Mood and Affect: Mood normal.        Behavior: Behavior normal.     No results found for any visits on 10/13/23.  {Labs (Optional):23779}  The 10-year ASCVD risk score (Arnett DK, et al., 2019) is: 16%    Assessment & Plan:  [name] KitchenDiagnoses and all orders for this visit:  Essential hypertension, benign  Centrilobular emphysema (HCC) -     ipratropium-albuterol (DUONEB) 0.5-2.5 (3) MG/3ML SOLN; Take 3 mLs by nebulization every 6 (six) hours as needed. USE 1 AMPULE IN NEBULIZER EVERY 6 HOURS AS NEEDED -     Fluticasone-Umeclidin-Vilant (TRELEGY ELLIPTA) 100-62.5-25 MCG/ACT AEPB; INHALE 1 PUFF INTO LUNGS ONCE DAILY -     CT Chest W Contrast; Future  COPD exacerbation (HCC) -     ipratropium-albuterol (DUONEB) 0.5-2.5 (3) MG/3ML SOLN; Take 3 mLs by nebulization every 6 (six) hours as needed. USE 1 AMPULE IN NEBULIZER EVERY 6  HOURS AS NEEDED  SOB (shortness of breath) -     ipratropium-albuterol (DUONEB) 0.5-2.5 (3) MG/3ML SOLN; Take 3 mLs by nebulization every 6 (six) hours as needed. USE 1 AMPULE IN NEBULIZER EVERY 6 HOURS AS NEEDED -     ECHOCARDIOGRAM COMPLETE; Future -     CT Chest W Contrast; Future  Lung nodule -     CT Chest W Contrast; Future  Other orders -     lisinopril (ZESTRIL) 20 MG tablet; Take 1 tablet (20 mg total) by mouth daily.    Problem List Items Addressed This Visit       Cardiovascular and Mediastinum   Essential hypertension, benign - Primary   Relevant Medications   lisinopril (ZESTRIL) 20 MG tablet     Respiratory   Centrilobular emphysema (HCC)   Relevant Medications   ipratropium-albuterol (DUONEB) 0.5-2.5 (3) MG/3ML SOLN   Fluticasone-Umeclidin-Vilant (TRELEGY ELLIPTA) 100-62.5-25  MCG/ACT AEPB   Other Relevant Orders   CT Chest W Contrast   COPD exacerbation (HCC)   Relevant Medications   ipratropium-albuterol (DUONEB) 0.5-2.5 (3) MG/3ML SOLN   Fluticasone-Umeclidin-Vilant (TRELEGY ELLIPTA) 100-62.5-25 MCG/ACT AEPB     Other   SOB (shortness of breath)   Relevant Medications   ipratropium-albuterol (DUONEB) 0.5-2.5 (3) MG/3ML SOLN   Other Relevant Orders   ECHOCARDIOGRAM COMPLETE   CT Chest W Contrast   Other Visit Diagnoses     Lung nodule       Relevant Orders   CT Chest W Contrast      Refill on DuoNeb Continue Trelegy  Continue Align program for neuropathy and diet plan  Ordered chest CT to follow up on 3mm nodule  BP decreasing, continue same dose of lisinopril  Ordered echocardiogram for worsening SOB and bilateral ankle swelling. Eye and foot exam UTD Educated on importance of smoking cessation. Patient aware and understands importance.   Return in about 4 weeks (around 11/10/2023) for Follow up.    Brooke D McNees, Student-PA

## 2023-10-14 ENCOUNTER — Encounter: Payer: Self-pay | Admitting: Physician Assistant

## 2023-10-19 ENCOUNTER — Other Ambulatory Visit: Payer: Self-pay | Admitting: Physician Assistant

## 2023-10-20 ENCOUNTER — Ambulatory Visit (INDEPENDENT_AMBULATORY_CARE_PROVIDER_SITE_OTHER): Payer: 59

## 2023-10-20 ENCOUNTER — Other Ambulatory Visit: Payer: Self-pay | Admitting: Physician Assistant

## 2023-10-20 DIAGNOSIS — I700 Atherosclerosis of aorta: Secondary | ICD-10-CM

## 2023-10-20 DIAGNOSIS — R911 Solitary pulmonary nodule: Secondary | ICD-10-CM | POA: Diagnosis not present

## 2023-10-20 DIAGNOSIS — J432 Centrilobular emphysema: Secondary | ICD-10-CM

## 2023-10-20 DIAGNOSIS — I288 Other diseases of pulmonary vessels: Secondary | ICD-10-CM | POA: Diagnosis not present

## 2023-10-20 DIAGNOSIS — R0602 Shortness of breath: Secondary | ICD-10-CM | POA: Diagnosis not present

## 2023-10-20 DIAGNOSIS — I2510 Atherosclerotic heart disease of native coronary artery without angina pectoris: Secondary | ICD-10-CM

## 2023-10-20 DIAGNOSIS — F40240 Claustrophobia: Secondary | ICD-10-CM

## 2023-10-20 DIAGNOSIS — R918 Other nonspecific abnormal finding of lung field: Secondary | ICD-10-CM | POA: Diagnosis not present

## 2023-10-20 DIAGNOSIS — F5101 Primary insomnia: Secondary | ICD-10-CM

## 2023-10-20 LAB — I-STAT CREATININE (MANUAL ENTRY): Creatinine, Ser: 1.5 — AB (ref 0.50–1.10)

## 2023-10-20 MED ORDER — IOHEXOL 300 MG/ML  SOLN
100.0000 mL | Freq: Once | INTRAMUSCULAR | Status: AC | PRN
  Administered 2023-10-20: 60 mL via INTRAVENOUS

## 2023-10-23 ENCOUNTER — Other Ambulatory Visit: Payer: Self-pay | Admitting: Physician Assistant

## 2023-10-23 DIAGNOSIS — E1165 Type 2 diabetes mellitus with hyperglycemia: Secondary | ICD-10-CM

## 2023-10-25 ENCOUNTER — Other Ambulatory Visit: Payer: Self-pay | Admitting: Physician Assistant

## 2023-10-25 DIAGNOSIS — J9690 Respiratory failure, unspecified, unspecified whether with hypoxia or hypercapnia: Secondary | ICD-10-CM | POA: Diagnosis not present

## 2023-10-25 DIAGNOSIS — I10 Essential (primary) hypertension: Secondary | ICD-10-CM

## 2023-11-03 ENCOUNTER — Encounter: Payer: Self-pay | Admitting: Physician Assistant

## 2023-11-04 ENCOUNTER — Ambulatory Visit: Payer: 59 | Admitting: Internal Medicine

## 2023-11-04 ENCOUNTER — Encounter: Payer: Self-pay | Admitting: Internal Medicine

## 2023-11-04 VITALS — BP 110/56 | HR 77 | Ht 66.0 in | Wt 294.8 lb

## 2023-11-04 DIAGNOSIS — F17200 Nicotine dependence, unspecified, uncomplicated: Secondary | ICD-10-CM | POA: Diagnosis not present

## 2023-11-04 DIAGNOSIS — J432 Centrilobular emphysema: Secondary | ICD-10-CM

## 2023-11-04 NOTE — Progress Notes (Signed)
Kathleen Le    [id number]    09/08/1961  Primary Care Physician:Breeback, Lonna Cobb, PA-C Date of Appointment: 11/04/2023 Established Patient Visit  Chief complaint:   Chief Complaint  Patient presents with   Follow-up    Copd f/u, pt states she has not been doing well. Feels like she is worst. Ct 10/22     HPI: Kathleen Le is a 62 y.o. woman with copd, chronic respiratory failure on 2-3LNC with ongoing tobacco use disorder.   Interval Updates: Here for follow up with me after one year.  She is on trelegy 1 puff once daily. On 2LNC continous, and 3L POC I have reviewed the patient's family social and past medical history and updated as appropriate. She never performed PFTs.  Taking albuterol inhaler 1-2 times/day. Also has nebulizer which she feels works better for her.   She continues to smoke. Not ready to quit. Has tried the patch but it didn't curb her craving. Has not tried medications.   Her father passed away earlier this year. Her brother in law just had major surgery. Lots of stressors in her life.   No interval exacerbations requiring prednisone and antibiotics in the last year.   Past Medical History:  Diagnosis Date   Anxiety    Arthritis    Asthma    Cancer (HCC)    uterine   COPD (chronic obstructive pulmonary disease) (HCC)    Degenerative disc disease, lumbar    Depression    Diabetes mellitus without complication (HCC)    Type II   Dyspnea    with exertion due to COPD   GERD (gastroesophageal reflux disease)    History of alcohol abuse    20-30 years ago, patient was going through divorce but does not currently drink any alcohol   History of kidney stones    passed   History of marijuana use    "its been a long time since I last smoked because of my COPD"   History of ovarian cancer 1984   Hypertension    Peripheral neuropathy    Pneumonia    Substance abuse (HCC)    patient denies, states she has only ever used marijuana    Swelling of both lower extremities     Past Surgical History:  Procedure Laterality Date   APPLICATION OF WOUND VAC Left 12/17/2022   Procedure: APPLICATION OF WOUND VAC;  Surgeon: Nadara Mustard, MD;  Location: [facility] OR;  Service: Orthopedics;  Laterality: Left;   CERVICAL CONIZATION W/BX  1985   DILATION AND CURETTAGE OF UTERUS     ECTOPIC PREGNANCY SURGERY     patient stated she had to have tube removed   HARDWARE REMOVAL Left 09/17/2022   Procedure: LEFT ELBOW HARDWARE REMOVAL;  Surgeon: Tarry Kos, MD;  Location: [facility];  Service: Orthopedics;  Laterality: Left;   I & D EXTREMITY Left 05/12/2022   Procedure: IRRIGATION AND DEBRIDEMENT LEFT ELBOW;  Surgeon: Tarry Kos, MD;  Location: [facility] OR;  Service: Orthopedics;  Laterality: Left;   I & D EXTREMITY Left 07/18/2022   Procedure: LEFT ELBOW IRRIGATION AND DEBRIDEMENT EXTREMITY;  Surgeon: Tarry Kos, MD;  Location: [facility];  Service: Orthopedics;  Laterality: Left;   I & D EXTREMITY Left 10/24/2022   Procedure: LEFT ELBOW DEBRIDEMENT;  Surgeon: Nadara Mustard, MD;  Location: [facility] OR;  Service: Orthopedics;  Laterality: Left;   I & D EXTREMITY Left 10/29/2022  Procedure: LEFT ELBOW DEBRIDEMENT;  Surgeon: Nadara Mustard, MD;  Location: [facility] OR;  Service: Orthopedics;  Laterality: Left;   I & D EXTREMITY Left 12/17/2022   Procedure: LEFT ELBOW DEBRIDEMENT;  Surgeon: Nadara Mustard, MD;  Location: [facility] OR;  Service: Orthopedics;  Laterality: Left;   IRRIGATION AND DEBRIDEMENT ELBOW Left 09/17/2022   Procedure: LEFT IRRIGATION AND DEBRIDEMENT ELBOW;  Surgeon: Tarry Kos, MD;  Location: [facility];  Service: Orthopedics;  Laterality: Left;   TONSILLECTOMY     TRICEPS TENDON REPAIR Left 02/07/2022   Procedure: left triceps repair;  Surgeon: Tarry Kos, MD;  Location: [facility] OR;  Service: Orthopedics;  Laterality: Left;    Family History  Problem Relation Age of Onset   Cancer Paternal Grandmother    Diabetes Paternal  Grandmother    Diabetes Paternal Grandfather    Heart attack Maternal Aunt    Cancer Maternal Uncle    Cancer Maternal Grandmother    Heart attack Mother    Cancer Mother    Stroke Mother    Diabetes Father    Heart attack Father    Diabetes Paternal Uncle    Diabetes Paternal Uncle     Social History   Occupational History   Not on file  Tobacco Use   Smoking status: Every Day    Current packs/day: 0.25    Average packs/day: 1.9 packs/day for 46.8 years (90.5 ttl pk-yrs)    Types: Cigarettes    Start date: 1978    Passive exposure: Never   Smokeless tobacco: Never   Tobacco comments:    Smokes 2 cigarettes/day  Vaping Use   Vaping status: Never Used  Substance and Sexual Activity   Alcohol use: Not Currently   Drug use: Not Currently    Types: Marijuana    Comment: 10/23/22- has not used since 2019   Sexual activity: Yes    Birth control/protection: Post-menopausal     Physical Exam: Blood pressure (!) 110/56, pulse 77, height 5\' 6"  (1.676 m), weight 294 lb 12.8 oz (133.7 kg), SpO2 98%.  Gen:      No acute distress, chronically ill appearing, obese ENT:  nasal cannulano nasal polyps, mucus membranes moist Lungs:    diminished, no wheeze CV:         Regular rate and rhythm; no murmurs, rubs, or gallops.  No pedal edema   Data Reviewed: Imaging: I have personally reviewed the CT Chest from October 2024 reviewed personally- no obvious nodules or masses concerning for malignancy but will await final read.     PFTs:      No data to display         I have personally reviewed the patient's PFTs and   Labs: Lab Results  Component Value Date   WBC 6.0 12/17/2022   HGB 10.4 (L) 12/17/2022   HCT 32.8 (L) 12/17/2022   MCV 89.4 12/17/2022   PLT 289 12/17/2022   Lab Results  Component Value Date   NA 142 02/02/2023   K 4.4 02/02/2023   CL 104 02/02/2023   CO2 28 02/02/2023     Immunization status: Immunization History  Administered Date(s)  Administered   Pneumococcal Polysaccharide-23 02/03/2014   Tdap 02/03/2014    External Records Personally Reviewed: primary care  Assessment:  COPD, stage unknown Chronic respiratory failure on 2-3LNC Tobacco use disorder, pre-contemplative Need for lung cancer screening  Plan/Recommendations:  Need PFT - Spirometry/DLCO - breathing testing - can be done anytime in the  next 6 months.   Continue the trelegy 1 puff once daily, gargle after use.  Continue albuterol inhaler/nebulizer up to 4 times daily as needed.  Continue oxygen therapy for goal oxygen level over 88%.  Continue to work on quitting smoking- tell me if you want to try medication.    CT Chest reviewed personally- no obvious nodules or masses concerning for malignancy but will await final read. After this can enroll in lung cancer screening program.    I spent 30 minutes on 11/04/2023 in care of this patient including face to face time and non-face to face time spent charting, review of outside records, and coordination of care.  Smoking Cessation Counseling:  1. The patient is an everyday smoker and symptomatic due to the following condition COPD 2. The patient is currently pre-contemplative in quitting smoking. 3. I advised patient to quit smoking. 4. We identified patient specific barriers to change.  5. I personally spent 3 minutes counseling the patient regarding tobacco use disorder. 6. We discussed management of stress and anxiety to help with smoking cessation, when applicable. 7. We discussed nicotine replacement therapy, Wellbutrin, Chantix as possible options. 8. I advised setting a quit date. 9. Follow?up arranged with our office to continue ongoing discussions. 10.Resources given to patient including quit hotline.    Return to Care: Return in about 6 months (around 05/03/2024).   Durel Salts, MD Pulmonary and Critical Care Medicine [facility] Office:[phone]

## 2023-11-04 NOTE — Patient Instructions (Addendum)
It was a pleasure to see you today!  Please schedule follow up scheduled with myself in 6 months.  If my schedule is not open yet, we will contact you with a reminder closer to that time. Please call [phone] if you haven't heard from [place] a month before, and always call us sooner if issues or concerns arise. You can also send [place] a message through MyChart, but but aware that this is not to be used for urgent issues and it may take up to 5-7 days to receive a reply. Please be aware that you will likely be able to view your results before I have a chance to respond to them. Please give [place] 5 business days to respond to any non-urgent results.    Before your next visit I would like you to have: Spirometry/DLCO - breathing testing - can be done anytime in the next 6 months.   Continue the trelegy 1 puff once daily, gargle after use.  Continue albuterol inhaler/nebulizer up to 4 times daily as needed.  Continue oxygen therapy for goal oxygen level over 88%.  Continue to work on quitting smoking- tell me if you want to try medication.   Your CT scan is not read yet - will update you when it's completed.

## 2023-11-06 ENCOUNTER — Encounter: Payer: Self-pay | Admitting: Physician Assistant

## 2023-11-06 ENCOUNTER — Other Ambulatory Visit: Payer: Self-pay | Admitting: Physician Assistant

## 2023-11-06 DIAGNOSIS — I10 Essential (primary) hypertension: Secondary | ICD-10-CM

## 2023-11-07 ENCOUNTER — Other Ambulatory Visit: Payer: Self-pay | Admitting: Physician Assistant

## 2023-11-09 NOTE — Progress Notes (Signed)
Phone call tomorrow to go over results.

## 2023-11-09 NOTE — Telephone Encounter (Signed)
Can we put her on for a phone call(not going to charge for tomorrow) at 1:00?

## 2023-11-10 ENCOUNTER — Encounter: Payer: Self-pay | Admitting: Physician Assistant

## 2023-11-10 ENCOUNTER — Telehealth (INDEPENDENT_AMBULATORY_CARE_PROVIDER_SITE_OTHER): Payer: 59 | Admitting: Physician Assistant

## 2023-11-10 VITALS — BP 147/68

## 2023-11-10 DIAGNOSIS — R0602 Shortness of breath: Secondary | ICD-10-CM

## 2023-11-10 DIAGNOSIS — I2583 Coronary atherosclerosis due to lipid rich plaque: Secondary | ICD-10-CM | POA: Diagnosis not present

## 2023-11-10 DIAGNOSIS — I2510 Atherosclerotic heart disease of native coronary artery without angina pectoris: Secondary | ICD-10-CM

## 2023-11-10 DIAGNOSIS — I10 Essential (primary) hypertension: Secondary | ICD-10-CM | POA: Diagnosis not present

## 2023-11-10 DIAGNOSIS — I272 Pulmonary hypertension, unspecified: Secondary | ICD-10-CM

## 2023-11-10 DIAGNOSIS — I700 Atherosclerosis of aorta: Secondary | ICD-10-CM | POA: Insufficient documentation

## 2023-11-10 DIAGNOSIS — J432 Centrilobular emphysema: Secondary | ICD-10-CM

## 2023-11-10 DIAGNOSIS — R911 Solitary pulmonary nodule: Secondary | ICD-10-CM | POA: Diagnosis not present

## 2023-11-10 HISTORY — DX: Atherosclerotic heart disease of native coronary artery without angina pectoris: I25.10

## 2023-11-10 HISTORY — DX: Atherosclerosis of aorta: I70.0

## 2023-11-10 NOTE — Progress Notes (Unsigned)
Attempted to contact pt at 1043am. No answer. LVM for return call.   Pt returned the called to discuss results. Father passed away. She's currently traveling in [facility] for the homegoing.

## 2023-11-10 NOTE — Progress Notes (Signed)
Determination pending for Myocardial testing. Per insurance, clinical notes are required. Case #[id number].

## 2023-11-11 ENCOUNTER — Encounter: Payer: Self-pay | Admitting: Physician Assistant

## 2023-11-11 DIAGNOSIS — I272 Pulmonary hypertension, unspecified: Secondary | ICD-10-CM | POA: Insufficient documentation

## 2023-11-11 DIAGNOSIS — R911 Solitary pulmonary nodule: Secondary | ICD-10-CM | POA: Insufficient documentation

## 2023-11-11 HISTORY — DX: Pulmonary hypertension, unspecified: I27.20

## 2023-11-11 HISTORY — DX: Solitary pulmonary nodule: R91.1

## 2023-11-11 NOTE — Progress Notes (Signed)
..Virtual Visit via Video Note  I connected with Kathleen Le on 11/11/23 at  1:00 PM EST by a video enabled telemedicine application and verified that I am speaking with the correct person using two identifiers.  Location: Patient: home Provider: clinic  .Marland KitchenParticipating in visit:  Patient: Kathleen Le Provider: Tandy Gaw PA-C   I discussed the limitations of evaluation and management by telemedicine and the availability of in person appointments. The patient expressed understanding and agreed to proceed.  History of Present Illness: Pt is a 62 yo obese female with COPD, T2DM, HTN, HLD and who smokes daily presents to the clinic to go over CT of chest and findings.   She c/o worsening SOB and use of O2 more and more with any exertion. She denies CP.   .. Active Ambulatory Problems    Diagnosis Date Noted   Type II diabetes mellitus, uncontrolled 02/03/2014   Asthma 02/03/2014   Essential hypertension, benign 02/03/2014   Neuropathy 02/05/2014   IT band syndrome 02/05/2014   Sinus tachycardia 02/05/2014   Memory changes 02/05/2014   Word finding difficulty 02/05/2014   Cervical cancer (HCC) 02/05/2014   Depression 05/19/2014   Anxiety 05/19/2014   Chronic bilateral low back pain with right-sided sciatica 05/19/2014   DDD (degenerative disc disease), lumbar 05/19/2014   PTSD (post-traumatic stress disorder) 09/26/2014   Foot callus 10/03/2014   Gastroesophageal reflux disease with esophagitis 01/10/2015   Cerumen impaction 01/24/2015   Hyperlipidemia 01/24/2015   Pulmonary emphysema (HCC) 07/26/2012   Class 3 severe obesity due to excess calories with serious comorbidity and body mass index (BMI) of 45.0 to 49.9 in adult (HCC) 08/31/2017   Insomnia due to other mental disorder 10/27/2017   History of substance abuse (HCC) 10/27/2017   GAD (generalized anxiety disorder) 10/27/2017   Lower leg edema 11/10/2017   Current smoker 06/08/2018   Diabetes mellitus, type II, insulin  dependent (HCC) 06/08/2018   Claustrophobia 06/08/2018   Primary insomnia 06/08/2018   Hot flashes 06/08/2018   Abnormal weight gain 06/08/2018   Acute stress reaction 09/12/2018   Anterolisthesis 07/30/2020   Hyperlipidemia associated with type 2 diabetes mellitus (HCC) 11/05/2020   Non-restorative sleep 11/05/2020   Snoring 11/05/2020   Chronic obstructive pulmonary disease (HCC) 02/26/2021   Encounter for smoking cessation counseling 11/15/2021   Fall 12/31/2021   Contusion of left arm 12/31/2021   Acute left-sided low back pain without sciatica 12/31/2021   Neck pain 12/31/2021   Triceps tendon rupture, left, initial encounter 02/07/2022   Centrilobular emphysema (HCC) 05/09/2022   Elbow wound, left, sequela 05/11/2022   Bursitis of elbow 05/28/2022   COPD exacerbation (HCC) 09/02/2022   Septic bursitis of elbow 10/24/2022   Tobacco abuse 01/16/2023   Chronic respiratory failure with hypoxia (HCC) 01/16/2023   Hypotension 01/23/2023   Hypoglycemic reaction 05/20/2023   Seborrheic keratoses 05/20/2023   Dyslipidemia, goal LDL below 70 08/21/2023   Moderate episode of recurrent major depressive disorder (HCC) 08/21/2023   SOB (shortness of breath) 10/13/2023   Aortic atherosclerosis (HCC) 11/10/2023   Coronary artery disease due to lipid rich plaque 11/10/2023   Left lower lobe pulmonary nodule 11/11/2023   Pulmonary hypertension (HCC) 11/11/2023   Resolved Ambulatory Problems    Diagnosis Date Noted   Bruised rib 09/26/2014   Bruised ribs 09/26/2014   Wound of right leg 09/26/2014   Essential hypertension 11/10/2017   Former smoker 03/09/2018   Past Medical History:  Diagnosis Date   Arthritis    Cancer (  HCC)    COPD (chronic obstructive pulmonary disease) (HCC)    Degenerative disc disease, lumbar    Diabetes mellitus without complication (HCC)    Dyspnea    GERD (gastroesophageal reflux disease)    History of alcohol abuse    History of kidney stones     History of marijuana use    History of ovarian cancer 1984   Hypertension    Peripheral neuropathy    Pneumonia    Substance abuse (HCC)    Swelling of both lower extremities        Observations/Objective: No acute distress Some stable labored breathing that went away with rest Normal appearance and mood  .[name] Today's Vitals   11/10/23 1310  BP: (!) 147/68     Assessment and Plan: [name] KitchenEndya was seen today for results.  Diagnoses and all orders for this visit:  Coronary artery disease due to lipid rich plaque -     MYOCARDIAL PERFUSION IMAGING; Future -     Cardiac Stress Test: Informed Consent Details: Physician/Practitioner Attestation; Transcribe to consent form and obtain patient signature  Aortic atherosclerosis (HCC) -     MYOCARDIAL PERFUSION IMAGING; Future -     Cardiac Stress Test: Informed Consent Details: Physician/Practitioner Attestation; Transcribe to consent form and obtain patient signature  SOB (shortness of breath) -     MYOCARDIAL PERFUSION IMAGING; Future -     Cardiac Stress Test: Informed Consent Details: Physician/Practitioner Attestation; Transcribe to consent form and obtain patient signature  Centrilobular emphysema (HCC)  Essential hypertension, benign -     MYOCARDIAL PERFUSION IMAGING; Future -     Cardiac Stress Test: Informed Consent Details: Physician/Practitioner Attestation; Transcribe to consent form and obtain patient signature  Left lower lobe pulmonary nodule  Pulmonary hypertension (HCC)   IMPRESSION: 1. No pulmonary parenchymal findings to explain the patient's shortness of breath. 2. Age advanced three-vessel coronary artery calcification. 3. 5 mm left lower lobe nodule. No follow-up needed if patient is low-risk.This recommendation follows the consensus statement: Guidelines for Management of Incidental Pulmonary Nodules Detected on CT Images: From the Fleischner Society 2017; Radiology 2017; 284:228-243. 4.  Aortic  atherosclerosis (ICD10-I70.0). 5. Enlarged pulmonic trunk, indicative of pulmonary arterial hypertension.  Discussed with patient the left lower lobe nodule. Pt is high risk due to smoking and will monitor yearly.  Encouraged patient to stop smoking, she is aware of this risk but not ready to quit at this time.  Discussed pulmonary HTN and risk.  Stay on trelegy and keep BP controlled.    BP is improving some but not to goal.  Pt just increased lisinopril to 20mg . Continue to monitor at home and follow up in office in 1 month and will determine if dose needs to be increased again to get to goal of under 130/80.   CAD and Aortic atherosclerosis seen on CT Discussed increased cardiovascular risk for patient especially since smoker/T2DM/COPD.  She is having worsening SOB, echo is scheduled.  Pt is on crestor 20mg .  Pt is on baby ASA 81mg  daily.  Would like to get nuclear stress test to evaluate coronary arteries and need for early intervention.  Pt agrees to recommendation.    Follow Up Instructions:    I discussed the assessment and treatment plan with the patient. The patient was provided an opportunity to ask questions and all were answered. The patient agreed with the plan and demonstrated an understanding of the instructions.   The patient was advised to call back or  seek an in-person evaluation if the symptoms worsen or if the condition fails to improve as anticipated.   Tandy Gaw, PA-C

## 2023-11-13 ENCOUNTER — Other Ambulatory Visit: Payer: Self-pay | Admitting: Physician Assistant

## 2023-11-13 DIAGNOSIS — J441 Chronic obstructive pulmonary disease with (acute) exacerbation: Secondary | ICD-10-CM

## 2023-11-13 DIAGNOSIS — J432 Centrilobular emphysema: Secondary | ICD-10-CM

## 2023-11-13 DIAGNOSIS — R0602 Shortness of breath: Secondary | ICD-10-CM

## 2023-11-15 ENCOUNTER — Other Ambulatory Visit: Payer: Self-pay | Admitting: Physician Assistant

## 2023-11-15 DIAGNOSIS — E1165 Type 2 diabetes mellitus with hyperglycemia: Secondary | ICD-10-CM

## 2023-11-16 ENCOUNTER — Ambulatory Visit (HOSPITAL_BASED_OUTPATIENT_CLINIC_OR_DEPARTMENT_OTHER)
Admission: RE | Admit: 2023-11-16 | Discharge: 2023-11-16 | Disposition: A | Discharge status: Home / Self Care | Payer: 59 | Source: Ambulatory Visit | Attending: Physician Assistant | Admitting: Physician Assistant

## 2023-11-16 DIAGNOSIS — R0602 Shortness of breath: Secondary | ICD-10-CM | POA: Diagnosis not present

## 2023-11-17 LAB — ECHOCARDIOGRAM COMPLETE
AR max vel: 1.95 cm2
AV Area VTI: 1.9 cm2
AV Area mean vel: 1.93 cm2
AV Mean grad: 7.5 mm[Hg]
AV Peak grad: 13.4 mm[Hg]
Ao pk vel: 1.83 m/s
Area-P 1/2: 2.55 cm2
Calc EF: 57.8 %
S' Lateral: 3.2 cm
Single Plane A2C EF: 60.7 %
Single Plane A4C EF: 59 %

## 2023-11-18 ENCOUNTER — Encounter: Payer: Self-pay | Admitting: Physician Assistant

## 2023-11-18 NOTE — Progress Notes (Signed)
Kathleen Le,   Your ejection fraction is normal range. There is some trivial mitral valv regurgitation but no concerning valvular issues. GREAT news. GREAT echo.

## 2023-11-18 NOTE — Telephone Encounter (Signed)
Is there an update on her lexiscan?

## 2023-11-20 ENCOUNTER — Encounter: Payer: Self-pay | Admitting: Physician Assistant

## 2023-11-20 ENCOUNTER — Ambulatory Visit: Payer: 59 | Admitting: Physician Assistant

## 2023-11-20 VITALS — BP 138/64 | HR 66 | Ht 66.0 in | Wt 299.0 lb

## 2023-11-20 DIAGNOSIS — J432 Centrilobular emphysema: Secondary | ICD-10-CM

## 2023-11-20 DIAGNOSIS — Z7984 Long term (current) use of oral hypoglycemic drugs: Secondary | ICD-10-CM

## 2023-11-20 DIAGNOSIS — I700 Atherosclerosis of aorta: Secondary | ICD-10-CM | POA: Diagnosis not present

## 2023-11-20 DIAGNOSIS — E1165 Type 2 diabetes mellitus with hyperglycemia: Secondary | ICD-10-CM | POA: Diagnosis not present

## 2023-11-20 DIAGNOSIS — I2510 Atherosclerotic heart disease of native coronary artery without angina pectoris: Secondary | ICD-10-CM | POA: Diagnosis not present

## 2023-11-20 DIAGNOSIS — I2583 Coronary atherosclerosis due to lipid rich plaque: Secondary | ICD-10-CM

## 2023-11-20 DIAGNOSIS — R0981 Nasal congestion: Secondary | ICD-10-CM

## 2023-11-20 DIAGNOSIS — J9611 Chronic respiratory failure with hypoxia: Secondary | ICD-10-CM

## 2023-11-20 HISTORY — DX: Nasal congestion: R09.81

## 2023-11-20 LAB — POCT GLYCOSYLATED HEMOGLOBIN (HGB A1C): Hemoglobin A1C: 7.8 % — AB (ref 4.0–5.6)

## 2023-11-20 MED ORDER — FLUTICASONE PROPIONATE 50 MCG/ACT NA SUSP: 2.0000 | Freq: Two times a day (BID) | NASAL | 1 refills | Status: AC

## 2023-11-20 MED ORDER — TRULICITY 0.75 MG/0.5ML ~~LOC~~ SOAJ: 0.7500 mg | SUBCUTANEOUS | 2 refills | Status: DC

## 2023-11-20 NOTE — Progress Notes (Unsigned)
Need PFT - Spirometry/DLCO - breathing testing - can be done anytime in the next 6 months.    Continue the trelegy 1 puff once daily, gargle after use.  Continue albuterol inhaler/nebulizer up to 4 times daily as needed.  Continue oxygen therapy for goal oxygen level over 88%.  Continue to work on quitting smoking- tell me if you want to try medication.   .. Lab Results  Component Value Date   HGBA1C 7.4 (A) 08/19/2023

## 2023-11-20 NOTE — Progress Notes (Unsigned)
Established Patient Office Visit  Subjective   Patient ID: Kathleen Le, female    DOB: 28-Aug-1961  Age: 62 y.o. MRN: [id number]  Chief Complaint  Patient presents with   Medical Management of Chronic Issues    Last A1c 7.4 08/19/23    HPI 62 yo female presents today for diabetic follow up. She currently takes 10 units of insulin at night and in the morning. Her morning blood glucose is usually 112-115 but has gotten as low as 50. Her blood sugar is the highest in the afternoon.  Patient has been having trouble with increased SOB. PMH COPD, She is using 2L O2 at rest and 3L O2 with movement. She has Trelegy, DuoNeb, and albuterol, which help current SOB exacerbations. She sees a pulmonologist. Echo showed normal EF. Lexiscan ordered but has yet to be conducted. Patient denies CP. Her at home SBP average around 137.   Patient also reports current congestion. She has rhinorrhea during the day with clear mucus. She has not tried any nasal sprays or Mucinex.  Review of Systems  HENT:  Positive for congestion.   Respiratory:  Positive for shortness of breath.   Cardiovascular:  Negative for chest pain.      Objective:     BP 138/64 (BP Location: Right Arm)   Pulse 66   Ht 5\' 6"  (1.676 m)   Wt 135.6 kg   SpO2 99%   BMI 48.26 kg/m  BP Readings from Last 3 Encounters:  11/20/23 138/64  11/10/23 (!) 147/68  11/04/23 (!) 110/56   Wt Readings from Last 3 Encounters:  11/20/23 135.6 kg  11/04/23 133.7 kg  10/13/23 134.3 kg      Physical Exam Constitutional:      Appearance: She is obese.  HENT:     Right Ear: Tympanic membrane, ear canal and external ear normal.     Left Ear: Tympanic membrane, ear canal and external ear normal.     Nose: Nose normal.  Cardiovascular:     Rate and Rhythm: Normal rate and regular rhythm.     Pulses: Normal pulses.     Heart sounds: Normal heart sounds.  Pulmonary:     Breath sounds: Decreased air movement present. Decreased breath  sounds and rhonchi present.     Comments: Diminished breath sounds with scattered rhonchi Neurological:     Mental Status: She is alert.     Results for orders placed or performed in visit on 11/20/23  POCT HgB A1C  Result Value Ref Range   Hemoglobin A1C 7.8 (A) 4.0 - 5.6 %   HbA1c POC (<> result, manual entry)     HbA1c, POC (prediabetic range)     HbA1c, POC (controlled diabetic range)      The 10-year ASCVD risk score (Arnett DK, et al., 2019) is: 16.5%    Assessment & Plan:   Problem List Items Addressed This Visit       Cardiovascular and Mediastinum   Aortic atherosclerosis (HCC)   Coronary artery disease due to lipid rich plaque     Respiratory   Centrilobular emphysema (HCC)   Chronic respiratory failure with hypoxia (HCC)     Endocrine   Type II diabetes mellitus, uncontrolled - Primary   Relevant Medications   Dulaglutide (TRULICITY) 0.75 MG/0.5ML SOAJ   Other Relevant Orders   POCT HgB A1C (Completed)   Discussed with patient cardiac etiologies for SOB including valvular, arrhythmias, and blood supply problems. Discussed with patient that once these things  are ruled out, the etiology is most likely pulmonary. Continue Trelegy, albuterol, and O2 therapy.  Goal O2 sat > 88%. Advised patient to take deep breaths every hour. Advised patient to see pulmonologist after Lexiscan. Advised patient to contact office if not contacted about Lexiscan in one week.  Increase morning insulin to 15 units for elevated blood glucose.  Advised patient to make sure to eat food in morning with this increased dosage.  Advised patient to use prescribed nasal spray twice a day until symptom resolution.   Pt is on a statin. Follow up in 3 mo for routine diabetic management.  Return in about 3 months (around 02/20/2024).    AnnaCollin M Mace, Student-PA

## 2023-11-20 NOTE — Patient Instructions (Signed)
Increase insulin in morning to 15 units Will try to get shots approved.

## 2023-11-23 NOTE — Progress Notes (Signed)
Insurance has authorization the study for the Myocardial. Ref# [id number]. Valid from 11/10/23 to 05/17/24. Centralized scheduling will reach out to the patient for scheduling her appointment.

## 2023-11-24 ENCOUNTER — Encounter: Payer: Self-pay | Admitting: Physician Assistant

## 2023-11-25 DIAGNOSIS — J9690 Respiratory failure, unspecified, unspecified whether with hypoxia or hypercapnia: Secondary | ICD-10-CM | POA: Diagnosis not present

## 2023-12-01 ENCOUNTER — Other Ambulatory Visit: Payer: Self-pay | Admitting: Physician Assistant

## 2023-12-01 ENCOUNTER — Telehealth (HOSPITAL_COMMUNITY): Payer: Self-pay | Admitting: *Deleted

## 2023-12-01 NOTE — Telephone Encounter (Signed)
Patient given detailed instructions per Myocardial Perfusion Study Information Sheet for the test on 12/09/23 Patient notified to arrive 15 minutes early and that it is imperative to arrive on time for appointment to keep from having the test rescheduled.  If you need to cancel or reschedule your appointment, please call the office within 24 hours of your appointment. . Patient verbalized understanding. Ricky Ala

## 2023-12-04 ENCOUNTER — Encounter: Payer: Self-pay | Admitting: Physician Assistant

## 2023-12-04 ENCOUNTER — Ambulatory Visit: Payer: 59 | Admitting: Physician Assistant

## 2023-12-04 ENCOUNTER — Ambulatory Visit (INDEPENDENT_AMBULATORY_CARE_PROVIDER_SITE_OTHER): Payer: 59

## 2023-12-04 VITALS — BP 159/75 | HR 108 | Temp 98.3°F | Ht 66.0 in | Wt 291.0 lb

## 2023-12-04 DIAGNOSIS — R0782 Intercostal pain: Secondary | ICD-10-CM

## 2023-12-04 DIAGNOSIS — R300 Dysuria: Secondary | ICD-10-CM | POA: Diagnosis not present

## 2023-12-04 DIAGNOSIS — N898 Other specified noninflammatory disorders of vagina: Secondary | ICD-10-CM | POA: Diagnosis not present

## 2023-12-04 DIAGNOSIS — S299XXA Unspecified injury of thorax, initial encounter: Secondary | ICD-10-CM

## 2023-12-04 DIAGNOSIS — R0781 Pleurodynia: Secondary | ICD-10-CM | POA: Insufficient documentation

## 2023-12-04 HISTORY — DX: Pleurodynia: R07.81

## 2023-12-04 LAB — POCT URINALYSIS DIP (CLINITEK)
Bilirubin, UA: NEGATIVE
Blood, UA: NEGATIVE
Glucose, UA: NEGATIVE mg/dL
Ketones, POC UA: NEGATIVE mg/dL
Nitrite, UA: NEGATIVE
POC PROTEIN,UA: NEGATIVE
Spec Grav, UA: >=1.03 — AB (ref 1.010–1.025)
Urobilinogen, UA: 0.2 U/dL
pH, UA: 5.5 (ref 5.0–8.0)

## 2023-12-04 MED ORDER — PHENAZOPYRIDINE HCL 200 MG PO TABS: 200.0000 mg | ORAL_TABLET | Freq: Three times a day (TID) | ORAL | 0 refills | Status: AC

## 2023-12-04 MED ORDER — LIDOCAINE 5 % EX PTCH: 1.0000 | MEDICATED_PATCH | Freq: Two times a day (BID) | CUTANEOUS | 0 refills | Status: DC

## 2023-12-04 NOTE — Patient Instructions (Addendum)
Start pyridium.  Will culture urine and added screening for BV and yeast.  STAT CXR with ribs ordered today  Rib Contusion A rib contusion is a deep bruise on the rib area. Contusions are the result of a blunt trauma that causes bleeding and injury to the tissues under the skin. A rib contusion may involve bruising of the ribs and of the skin and muscles in the area. The skin over the contusion may turn blue, purple, or yellow. Minor injuries result in a painless contusion. More severe contusions may be painful and swollen for a few weeks. What are the causes? This condition is usually caused by a hard, direct hit to an area of the body. This often occurs while playing contact sports. What are the signs or symptoms? Symptoms of this condition include: Swelling and redness of the injured area. Discoloration of the injured area. Tenderness and soreness of the injured area. Pain with or without movement. Pain when breathing in. How is this diagnosed? This condition may be diagnosed based on: Your symptoms and medical history. A physical exam. Imaging tests--such as an X-ray, CT scan, or MRI--to determine if there were internal injuries or broken bones (fractures). How is this treated? This condition may be treated with: Rest. This is often the best treatment for a rib contusion. Ice packs. This reduces swelling and inflammation. Deep-breathing exercises. These may be recommended to reduce the risk for lung collapse and pneumonia. Medicines. Over-the-counter or prescription medicines may be given to control pain. Injection of a numbing medicine around the nerve near your injury (nerve block). Follow these instructions at home: Medicines Take over-the-counter and prescription medicines only as told by your health care provider. Ask your health care provider if the medicine prescribed to you: Requires you to avoid driving or using machinery. Can cause constipation. You may need to take  these actions to prevent or treat constipation: Drink enough fluid to keep your urine pale yellow. Take over-the-counter or prescription medicines. Eat foods that are high in fiber, such as beans, whole grains, and fresh fruits and vegetables. Limit foods that are high in fat and processed sugars, such as fried or sweet foods. Managing pain, stiffness, and swelling If directed, put ice on the injured area. To do this: Put ice in a plastic bag. Place a towel between your skin and the bag. Leave the ice on for 20 minutes, 2-3 times a day. Remove the ice if your skin turns bright red. This is very important. If you cannot feel pain, heat, or cold, you have a greater risk of damage to the area.  Activity Rest the injured area. Avoid strenuous activity and any activities or movements that cause pain. Be careful during activities, and avoid bumping the injured area. Do not lift anything that is heavier than 5 lb (2.3 kg), or the limit that you are told, until your health care provider says that it is safe. General instructions  Do not use any products that contain nicotine or tobacco, such as cigarettes, e-cigarettes, and chewing tobacco. These can delay healing. If you need help quitting, ask your health care provider. Do deep-breathing exercises as told by your health care provider. If you were given an incentive spirometer, use it every 1-2 hours while you are awake, or as recommended by your health care provider. This device measures how well you are filling your lungs with each breath. Keep all follow-up visits. This is important. Contact a health care provider if you have: Increased bruising  or swelling. Pain that is not controlled with treatment. A fever. Get help right away if you: Have difficulty breathing or shortness of breath. Develop a continual cough, or you cough up thick or bloody mucus from your lungs (sputum). Feel nauseous or you vomit. Have pain in your abdomen. These  symptoms may represent a serious problem that is an emergency. Do not wait to see if the symptoms will go away. Get medical help right away. Call your local emergency services (911 in the [place] Do not drive yourself to the hospital. Summary A rib contusion is a deep bruise on your rib area. Contusions are the result of a blunt trauma that causes bleeding and injury to the tissues under the skin. The skin over the contusion may turn blue, purple, or yellow. Minor injuries may cause a painless contusion. More severe contusions may be painful and swollen for a few weeks. Rest the injured area. Avoid strenuous activity and any activities or movements that cause pain. This information is not intended to replace advice given to you by your health care provider. Make sure you discuss any questions you have with your health care provider. Document Revised: 03/21/2020 Document Reviewed: 03/21/2020 Elsevier Patient Education  2024 [employer].

## 2023-12-04 NOTE — Progress Notes (Signed)
Acute Office Visit  Subjective:     Patient ID: Kathleen Le, female    DOB: 03/27/1961, 62 y.o.   MRN: [id number]  Chief Complaint  Patient presents with   Dysuria    Dysuria  Pertinent negatives include no hematuria.   62 yo female is in today for dark, odorous urine and low back pain starting 1 wk ago. She has some dysuria. She has not had a recent UTI. She is having a lot of lower abdominal pressure and urge to urinate.   Also, 3 days ago she leaned over and heard a "pop" in her LUQ. She says it is painful, especially with movement and breathing. She does have a cough with brownish sputum production. Denies SOB.  [name]. Active Ambulatory Problems    Diagnosis Date Noted   Type II diabetes mellitus, uncontrolled 02/03/2014   Asthma 02/03/2014   Essential hypertension, benign 02/03/2014   Neuropathy 02/05/2014   IT band syndrome 02/05/2014   Sinus tachycardia 02/05/2014   Memory changes 02/05/2014   Word finding difficulty 02/05/2014   Cervical cancer (HCC) 02/05/2014   Depression 05/19/2014   Anxiety 05/19/2014   Chronic bilateral low back pain with right-sided sciatica 05/19/2014   DDD (degenerative disc disease), lumbar 05/19/2014   Rib injury 09/26/2014   PTSD (post-traumatic stress disorder) 09/26/2014   Foot callus 10/03/2014   Gastroesophageal reflux disease with esophagitis 01/10/2015   Cerumen impaction 01/24/2015   Hyperlipidemia 01/24/2015   Pulmonary emphysema (HCC) 07/26/2012   Class 3 severe obesity due to excess calories with serious comorbidity and body mass index (BMI) of 45.0 to 49.9 in adult (HCC) 08/31/2017   Insomnia due to other mental disorder 10/27/2017   History of substance abuse (HCC) 10/27/2017   GAD (generalized anxiety disorder) 10/27/2017   Lower leg edema 11/10/2017   Current smoker 06/08/2018   Diabetes mellitus, type II, insulin dependent (HCC) 06/08/2018   Claustrophobia 06/08/2018   Primary insomnia 06/08/2018   Hot flashes  06/08/2018   Abnormal weight gain 06/08/2018   Acute stress reaction 09/12/2018   Anterolisthesis 07/30/2020   Hyperlipidemia associated with type 2 diabetes mellitus (HCC) 11/05/2020   Non-restorative sleep 11/05/2020   Snoring 11/05/2020   Chronic obstructive pulmonary disease (HCC) 02/26/2021   Encounter for smoking cessation counseling 11/15/2021   Fall 12/31/2021   Contusion of left arm 12/31/2021   Acute left-sided low back pain without sciatica 12/31/2021   Neck pain 12/31/2021   Triceps tendon rupture, left, initial encounter 02/07/2022   Centrilobular emphysema (HCC) 05/09/2022   Elbow wound, left, sequela 05/11/2022   Bursitis of elbow 05/28/2022   COPD exacerbation (HCC) 09/02/2022   Septic bursitis of elbow 10/24/2022   Tobacco abuse 01/16/2023   Chronic respiratory failure with hypoxia (HCC) 01/16/2023   Hypotension 01/23/2023   Hypoglycemic reaction 05/20/2023   Seborrheic keratoses 05/20/2023   Dyslipidemia, goal LDL below 70 08/21/2023   Moderate episode of recurrent major depressive disorder (HCC) 08/21/2023   SOB (shortness of breath) 10/13/2023   Aortic atherosclerosis (HCC) 11/10/2023   Coronary artery disease due to lipid rich plaque 11/10/2023   Left lower lobe pulmonary nodule 11/11/2023   Pulmonary hypertension (HCC) 11/11/2023   Nasal congestion 11/20/2023   Rib pain on left side 12/04/2023   Resolved Ambulatory Problems    Diagnosis Date Noted   Bruised rib 09/26/2014   Wound of right leg 09/26/2014   Essential hypertension 11/10/2017   Former smoker 03/09/2018   Past Medical History:  Diagnosis Date  Arthritis    Cancer (HCC)    COPD (chronic obstructive pulmonary disease) (HCC)    Degenerative disc disease, lumbar    Diabetes mellitus without complication (HCC)    Dyspnea    GERD (gastroesophageal reflux disease)    History of alcohol abuse    History of kidney stones    History of marijuana use    History of ovarian cancer 1984    Hypertension    Peripheral neuropathy    Pneumonia    Substance abuse (HCC)    Swelling of both lower extremities       Review of Systems  Respiratory:  Positive for cough and sputum production. Negative for shortness of breath.   Genitourinary:  Negative for dysuria and hematuria.  Musculoskeletal:  Positive for back pain.      Objective:    BP (!) 159/75   Pulse (!) 108   Temp 98.3 F (36.8 C) (Oral)   Ht 5\' 6"  (1.676 m)   Wt 291 lb (132 kg)   SpO2 94%   BMI 46.97 kg/m  BP Readings from Last 3 Encounters:  12/04/23 (!) 159/75  11/20/23 138/64  11/10/23 (!) 147/68      Physical Exam Constitutional:      Appearance: Normal appearance. She is obese.  HENT:     Head: Normocephalic.  Cardiovascular:     Rate and Rhythm: Tachycardia present.  Abdominal:     Tenderness: There is abdominal tenderness (over left lower ribs) in the left upper quadrant.     Hernia: No hernia is present.     Comments: No bruising present. Tenderness to palpation over left lower ribs. No step off.   Neurological:     Mental Status: She is alert and oriented to person, place, and time.  Psychiatric:        Mood and Affect: Mood normal.    .. Results for orders placed or performed in visit on 12/04/23  POCT URINALYSIS DIP (CLINITEK)  Result Value Ref Range   Color, UA yellow yellow   Clarity, UA cloudy (A) clear   Glucose, UA negative negative mg/dL   Bilirubin, UA negative negative   Ketones, POC UA negative negative mg/dL   Spec Grav, UA >=1.610 (A) 1.010 - 1.025   Blood, UA negative negative   pH, UA 5.5 5.0 - 8.0   POC PROTEIN,UA negative negative, trace   Urobilinogen, UA 0.2 0.2 or 1.0 E.U./dL   Nitrite, UA Negative Negative   Leukocytes, UA Small (1+) (A) Negative         Assessment & Plan:  [name] KitchenNikira was seen today for dysuria.  Diagnoses and all orders for this visit:  Dysuria -     POCT URINALYSIS DIP (CLINITEK) -     Urine Culture -     DG Ribs Unilateral  W/Chest Left; Future -     phenazopyridine (PYRIDIUM) 200 MG tablet; Take 1 tablet (200 mg total) by mouth 3 (three) times daily for 2 days. -     NuSwab BV and Candida, NAA  Vaginal odor -     phenazopyridine (PYRIDIUM) 200 MG tablet; Take 1 tablet (200 mg total) by mouth 3 (three) times daily for 2 days. -     NuSwab BV and Candida, NAA  Rib pain on left side -     lidocaine (LIDODERM) 5 %; Place 1 patch onto the skin every 12 (twelve) hours. Remove & Discard patch within 12 hours or as directed by MD  Rib  injury -     lidocaine (LIDODERM) 5 %; Place 1 patch onto the skin every 12 (twelve) hours. Remove & Discard patch within 12 hours or as directed by MD     UA showed small leukos without blood, protein, nitrites. sent for culture. BV swab ordered. Pyridium for inflammation. Increase hydration.   BP not to goal on amlodipine, atenolol, and hydrochlorothiazide. Pt is in pain today Continue to monitor at home Recheck in 1 month.   Stat CXR ordered that did not show any fractures.  No bony deformity noted. HO for rib contusion Lidocaine patches prescribed for pain. Encouraged icing regularly.     Tandy Gaw, PA-C

## 2023-12-04 NOTE — Progress Notes (Signed)
No evidence of rib fractures.  Ice and use lidocaine patches

## 2023-12-06 ENCOUNTER — Other Ambulatory Visit: Payer: Self-pay | Admitting: Physician Assistant

## 2023-12-07 LAB — URINE CULTURE

## 2023-12-07 NOTE — Progress Notes (Signed)
Preliminary testing negative for yeast or bacterial vaginosis.

## 2023-12-08 LAB — NUSWAB BV AND CANDIDA, NAA

## 2023-12-08 NOTE — Progress Notes (Signed)
Was this ordered wrong? Can we find out why it was not ran?

## 2023-12-09 ENCOUNTER — Encounter (HOSPITAL_COMMUNITY): Payer: Self-pay

## 2023-12-09 ENCOUNTER — Ambulatory Visit (HOSPITAL_COMMUNITY): Payer: 59

## 2023-12-09 ENCOUNTER — Other Ambulatory Visit (HOSPITAL_COMMUNITY): Payer: Self-pay | Admitting: Physician Assistant

## 2023-12-09 ENCOUNTER — Ambulatory Visit (HOSPITAL_COMMUNITY): Payer: 59 | Attending: Physician Assistant

## 2023-12-09 DIAGNOSIS — R0602 Shortness of breath: Secondary | ICD-10-CM | POA: Insufficient documentation

## 2023-12-09 MED ORDER — TECHNETIUM TC 99M TETROFOSMIN IV KIT
31.2000 | PACK | Freq: Once | INTRAVENOUS | Status: AC | PRN
  Administered 2023-12-09: 31.2 via INTRAVENOUS

## 2023-12-09 MED ORDER — REGADENOSON 0.4 MG/5ML IV SOLN
0.4000 mg | Freq: Once | INTRAVENOUS | Status: AC
  Administered 2023-12-09: 0.4 mg via INTRAVENOUS

## 2023-12-10 ENCOUNTER — Ambulatory Visit (HOSPITAL_COMMUNITY): Payer: 59

## 2023-12-10 LAB — NUSWAB BV AND CANDIDA, NAA

## 2023-12-10 LAB — MYOCARDIAL PERFUSION IMAGING
LV dias vol: 139 mL (ref 46–106)
LV sys vol: 77 mL
Nuc Stress EF: 45 %
Peak HR: 101 {beats}/min
Rest HR: 76 {beats}/min
Rest Nuclear Isotope Dose: 32.1 mCi
SDS: 3
SRS: 7
SSS: 10
ST Depression (mm): 0 mm
Stress Nuclear Isotope Dose: 31.2 mCi
TID: 0.97

## 2023-12-10 LAB — TRICHOMONAS CULTURE

## 2023-12-10 LAB — WET PREP, GENITAL
Clue Cell Exam: NEGATIVE
Trichomonas Exam: NEGATIVE
Yeast Exam: NEGATIVE

## 2023-12-10 LAB — SPECIMEN STATUS REPORT

## 2023-12-10 MED ORDER — TECHNETIUM TC 99M TETROFOSMIN IV KIT
32.1000 | PACK | Freq: Once | INTRAVENOUS | Status: AC | PRN
  Administered 2023-12-10: 32.1 via INTRAVENOUS

## 2023-12-11 ENCOUNTER — Other Ambulatory Visit: Payer: Self-pay | Admitting: Physician Assistant

## 2023-12-11 ENCOUNTER — Encounter: Payer: Self-pay | Admitting: Physician Assistant

## 2023-12-11 DIAGNOSIS — R931 Abnormal findings on diagnostic imaging of heart and coronary circulation: Secondary | ICD-10-CM

## 2023-12-11 DIAGNOSIS — I700 Atherosclerosis of aorta: Secondary | ICD-10-CM

## 2023-12-11 DIAGNOSIS — J9611 Chronic respiratory failure with hypoxia: Secondary | ICD-10-CM

## 2023-12-11 DIAGNOSIS — I2510 Atherosclerotic heart disease of native coronary artery without angina pectoris: Secondary | ICD-10-CM

## 2023-12-11 DIAGNOSIS — I272 Pulmonary hypertension, unspecified: Secondary | ICD-10-CM

## 2023-12-11 NOTE — Progress Notes (Signed)
We need to get you in with cardiology.   There are some areas of reduced blood flow in the heart. Your ejection fraction is showing decreased somewhat on this test.

## 2023-12-11 NOTE — Progress Notes (Signed)
Kathleen Le,   How are your symptoms? Trich negative but the BV/Yeast did not get sent right and was canceled. No bacterial growth on urine culture.

## 2023-12-12 ENCOUNTER — Other Ambulatory Visit: Payer: Self-pay | Admitting: Physician Assistant

## 2023-12-12 DIAGNOSIS — I10 Essential (primary) hypertension: Secondary | ICD-10-CM

## 2023-12-13 ENCOUNTER — Other Ambulatory Visit: Payer: Self-pay | Admitting: Physician Assistant

## 2023-12-13 DIAGNOSIS — E1165 Type 2 diabetes mellitus with hyperglycemia: Secondary | ICD-10-CM

## 2023-12-14 ENCOUNTER — Other Ambulatory Visit: Payer: Self-pay | Admitting: Physician Assistant

## 2023-12-14 DIAGNOSIS — E1165 Type 2 diabetes mellitus with hyperglycemia: Secondary | ICD-10-CM

## 2023-12-15 ENCOUNTER — Telehealth: Payer: Self-pay

## 2023-12-15 MED ORDER — INSULIN NPH (HUMAN) (ISOPHANE) 100 UNIT/ML ~~LOC~~ SUSP: SUBCUTANEOUS | 2 refills | Status: DC

## 2023-12-15 MED ORDER — METRONIDAZOLE 500 MG PO TABS: 500.0000 mg | ORAL_TABLET | Freq: Two times a day (BID) | ORAL | 0 refills | Status: AC

## 2023-12-15 NOTE — Telephone Encounter (Signed)
Patient called. See result notes.

## 2023-12-15 NOTE — Telephone Encounter (Signed)
Task completed. Lab results and provider's note were seen by patient [name] on 12/14/2023  1:10 PM.

## 2023-12-15 NOTE — Telephone Encounter (Signed)
Copied from CRM [id number]. Topic: Clinical - Lab/Test Results >> Dec 11, 2023  5:06 PM Alvino Blood C wrote: Reason for CRM: PT is returning call and would like a call back to discuss results and would like a call back  at [phone]

## 2023-12-15 NOTE — Addendum Note (Signed)
Addended by: Jomarie Longs on: 12/15/2023 04:10 PM   Modules accepted: Orders

## 2023-12-25 DIAGNOSIS — J9690 Respiratory failure, unspecified, unspecified whether with hypoxia or hypercapnia: Secondary | ICD-10-CM | POA: Diagnosis not present

## 2024-01-02 ENCOUNTER — Other Ambulatory Visit: Payer: Self-pay | Admitting: Physician Assistant

## 2024-01-02 DIAGNOSIS — E1165 Type 2 diabetes mellitus with hyperglycemia: Secondary | ICD-10-CM

## 2024-01-10 ENCOUNTER — Other Ambulatory Visit: Payer: Self-pay | Admitting: Physician Assistant

## 2024-01-10 DIAGNOSIS — M51369 Other intervertebral disc degeneration, lumbar region without mention of lumbar back pain or lower extremity pain: Secondary | ICD-10-CM

## 2024-01-10 DIAGNOSIS — G629 Polyneuropathy, unspecified: Secondary | ICD-10-CM

## 2024-01-10 DIAGNOSIS — G8929 Other chronic pain: Secondary | ICD-10-CM

## 2024-01-15 ENCOUNTER — Other Ambulatory Visit: Payer: Self-pay | Admitting: Physician Assistant

## 2024-01-15 DIAGNOSIS — I10 Essential (primary) hypertension: Secondary | ICD-10-CM

## 2024-01-18 ENCOUNTER — Other Ambulatory Visit: Payer: Self-pay | Admitting: Physician Assistant

## 2024-01-18 DIAGNOSIS — R0600 Dyspnea, unspecified: Secondary | ICD-10-CM | POA: Insufficient documentation

## 2024-01-18 DIAGNOSIS — M1990 Unspecified osteoarthritis, unspecified site: Secondary | ICD-10-CM | POA: Insufficient documentation

## 2024-01-18 DIAGNOSIS — G629 Polyneuropathy, unspecified: Secondary | ICD-10-CM | POA: Insufficient documentation

## 2024-01-18 DIAGNOSIS — F1910 Other psychoactive substance abuse, uncomplicated: Secondary | ICD-10-CM | POA: Insufficient documentation

## 2024-01-18 DIAGNOSIS — F1291 Cannabis use, unspecified, in remission: Secondary | ICD-10-CM | POA: Insufficient documentation

## 2024-01-18 DIAGNOSIS — M7989 Other specified soft tissue disorders: Secondary | ICD-10-CM | POA: Insufficient documentation

## 2024-01-18 DIAGNOSIS — C801 Malignant (primary) neoplasm, unspecified: Secondary | ICD-10-CM | POA: Insufficient documentation

## 2024-01-18 DIAGNOSIS — I10 Essential (primary) hypertension: Secondary | ICD-10-CM | POA: Insufficient documentation

## 2024-01-18 DIAGNOSIS — K219 Gastro-esophageal reflux disease without esophagitis: Secondary | ICD-10-CM | POA: Insufficient documentation

## 2024-01-18 DIAGNOSIS — J189 Pneumonia, unspecified organism: Secondary | ICD-10-CM | POA: Insufficient documentation

## 2024-01-18 DIAGNOSIS — F1011 Alcohol abuse, in remission: Secondary | ICD-10-CM | POA: Insufficient documentation

## 2024-01-18 DIAGNOSIS — Z87442 Personal history of urinary calculi: Secondary | ICD-10-CM | POA: Insufficient documentation

## 2024-01-18 DIAGNOSIS — E119 Type 2 diabetes mellitus without complications: Secondary | ICD-10-CM | POA: Insufficient documentation

## 2024-01-18 DIAGNOSIS — M51369 Other intervertebral disc degeneration, lumbar region without mention of lumbar back pain or lower extremity pain: Secondary | ICD-10-CM | POA: Insufficient documentation

## 2024-01-19 ENCOUNTER — Other Ambulatory Visit: Payer: Self-pay | Admitting: Physician Assistant

## 2024-01-19 ENCOUNTER — Ambulatory Visit: Payer: 59

## 2024-01-19 VITALS — BP 134/68 | Ht 65.0 in | Wt 292.1 lb

## 2024-01-19 DIAGNOSIS — I10 Essential (primary) hypertension: Secondary | ICD-10-CM | POA: Diagnosis not present

## 2024-01-19 DIAGNOSIS — I25119 Atherosclerotic heart disease of native coronary artery with unspecified angina pectoris: Secondary | ICD-10-CM | POA: Diagnosis not present

## 2024-01-19 DIAGNOSIS — R9439 Abnormal result of other cardiovascular function study: Secondary | ICD-10-CM

## 2024-01-19 DIAGNOSIS — E782 Mixed hyperlipidemia: Secondary | ICD-10-CM | POA: Diagnosis not present

## 2024-01-19 DIAGNOSIS — E119 Type 2 diabetes mellitus without complications: Secondary | ICD-10-CM

## 2024-01-19 HISTORY — DX: Abnormal result of other cardiovascular function study: R94.39

## 2024-01-19 MED ORDER — ROSUVASTATIN CALCIUM 40 MG PO TABS: 40.0000 mg | ORAL_TABLET | Freq: Every day | ORAL | 3 refills | Status: DC

## 2024-01-19 MED ORDER — METOPROLOL TARTRATE 100 MG PO TABS: 100.0000 mg | ORAL_TABLET | Freq: Once | ORAL | 0 refills | Status: DC

## 2024-01-19 NOTE — Assessment & Plan Note (Signed)
Last lipid panel from 02-02-2023 with total cholesterol 162, HDL 57, triglycerides 188, LDL 77, suboptimal. Target LDL below 70 mg/dL in the setting of diabetes.  Currently on rosuvastatin 20 mg once daily, titrate up the dose to 40 mg once daily

## 2024-01-19 NOTE — Assessment & Plan Note (Signed)
Relatively well controlled blood pressures. Mentions blood pressure is well-controlled at home measurement 2. Continue atenolol 50 mg once daily Continue with amlodipine 2.5 mg once daily Continue lisinopril 20 mg once daily and Continue hydrochlorothiazide 25 mg once daily

## 2024-01-19 NOTE — Assessment & Plan Note (Addendum)
Reviewed results from the stress test. Further evaluation with CT coronary angiogram discussed.  Alternative approaches for cardiac catheterization coronary angiogram also reviewed. Other noninvasive methods with stress echocardiogram would not be an ideal approach given technically difficult transthoracic echocardiogram images recently.   Decided to proceed with CT coronary angiogram for further evaluation of CAD.  To hold atenolol on the morning of the test and take metoprolol tartrate 100 mg to help with heart rate optimization and further test.

## 2024-01-19 NOTE — Patient Instructions (Addendum)
Medication Instructions:    Increase your Rosuvastatin to 40 mg once a day.   On the morning of your CT DO NOT take your atenolol. Take metoprolol 100 mg two hours before.   *If you need a refill on your cardiac medications before your next appointment, please call your pharmacy*   Lab Work:   You will have a BMP completed today.  If you have labs (blood work) drawn today and your tests are completely normal, you will receive your results only by: MyChart Message (if you have MyChart) OR A paper copy in the mail If you have any lab test that is abnormal or we need to change your treatment, we will call you to review the results.   Testing/Procedures:   Your cardiac CT will be scheduled at one of the below locations:  [facility]     Please follow these instructions carefully (unless otherwise directed):  On the Night Before the Test: Be sure to Drink plenty of water. Do not consume any caffeinated/decaffeinated beverages or chocolate 12 hours prior to your test. Do not take any antihistamines 12 hours prior to your test.   On the Day of the Test:   Drink plenty of water until 1 hour prior to the test. Do not eat any food 4 hours prior to the test. You may take your regular medications prior to the test.  Take metoprolol (Lopressor) two hours prior to test. HOLD Hydrochlorothiazide morning of the test. FEMALES- please wear underwire-free bra if available, avoid dresses & tight clothing        After the Test: Drink plenty of water. After receiving IV contrast, you may experience a mild flushed feeling. This is normal. On occasion, you may experience a mild rash up to 24 hours after the test. This is not dangerous. If this occurs, you can take Benadryl 25 mg and increase your fluid intake. If you experience trouble breathing, this can be serious. If it is severe call 911 IMMEDIATELY. If it is mild, please call our office. If you take any of these  medications: Glipizide/Metformin, Avandament, Glucavance, please do not take 48 hours after completing test unless otherwise instructed.  We will call to schedule your test 2-4 weeks out understanding that some insurance companies will need an authorization prior to the service being performed.   For non-scheduling related questions, please contact the cardiac imaging nurse navigator should you have any questions/concerns: Rockwell Alexandria, Cardiac Imaging Nurse Navigator Larey Brick, Cardiac Imaging Nurse Navigator [facility] Direct Office Dial: [phone]   For scheduling needs, including cancellations and rescheduling, please call [place], [phone].    Follow-Up: At [facility], you and your health needs are our priority.  As part of our continuing mission to provide you with exceptional heart care, we have created designated Provider Care Teams.  These Care Teams include your primary Cardiologist (physician) and Advanced Practice Providers (APPs -  Physician Assistants and Nurse Practitioners) who all work together to provide you with the care you need, when you need it.  We recommend signing up for the patient portal called "MyChart".  Sign up information is provided on this After Visit Summary.  MyChart is used to connect with patients for Virtual Visits (Telemedicine).  Patients are able to view lab/test results, encounter notes, upcoming appointments, etc.  Non-urgent messages can be sent to your provider as well.   To learn more about what you can do with MyChart, go to [email or web address].  Your next appointment:   3 month follow up     Other Instructions Cardiac CT Angiogram A cardiac CT angiogram is a procedure to look at the heart and the area around the heart. It may be done to help find the cause of chest pains or other symptoms of heart disease. During this procedure, a substance called contrast dye is injected into the blood  vessels in the area to be checked. A large X-ray machine, called a CT scanner, then takes detailed pictures of the heart and the surrounding area. The procedure is also sometimes called a coronary CT angiogram, coronary artery scanning, or CTA. A cardiac CT angiogram allows the health care provider to see how well blood is flowing to and from the heart. The health care provider will be able to see if there are any problems, such as: Blockage or narrowing of the coronary arteries in the heart. Fluid around the heart. Signs of weakness or disease in the muscles, valves, and tissues of the heart. Tell a health care provider about: Any allergies you have. This is especially important if you have had a previous allergic reaction to contrast dye. All medicines you are taking, including vitamins, herbs, eye drops, creams, and over-the-counter medicines. Any blood disorders you have. Any surgeries you have had. Any medical conditions you have. Whether you are pregnant or may be pregnant. Any anxiety disorders, chronic pain, or other conditions you have that may increase your stress or prevent you from lying still. What are the risks? Generally, this is a safe procedure. However, problems may occur, including: Bleeding. Infection. Allergic reactions to medicines or dyes. Damage to other structures or organs. Kidney damage from the contrast dye that is used. Increased risk of cancer from radiation exposure. This risk is low. Talk with your health care provider about: The risks and benefits of testing. How you can receive the lowest dose of radiation. What happens before the procedure? Wear comfortable clothing and remove any jewelry, glasses, dentures, and hearing aids. Follow instructions from your health care provider about eating and drinking. This may include: For 12 hours before the procedure -- avoid caffeine. This includes tea, coffee, soda, energy drinks, and diet pills. Drink plenty of water  or other fluids that do not have caffeine in them. Being well hydrated can prevent complications. For 4-6 hours before the procedure -- stop eating and drinking. The contrast dye can cause nausea, but this is less likely if your stomach is empty. Ask your health care provider about changing or stopping your regular medicines. This is especially important if you are taking diabetes medicines, blood thinners, or medicines to treat problems with erections (erectile dysfunction). What happens during the procedure?  Hair on your chest may need to be removed so that small sticky patches called electrodes can be placed on your chest. These will transmit information that helps to monitor your heart during the procedure. An IV will be inserted into one of your veins. You might be given a medicine to control your heart rate during the procedure. This will help to ensure that good images are obtained. You will be asked to lie on an exam table. This table will slide in and out of the CT machine during the procedure. Contrast dye will be injected into the IV. You might feel warm, or you may get a metallic taste in your mouth. You will be given a medicine called nitroglycerin. This will relax or dilate the arteries in your heart. The table  that you are lying on will move into the CT machine tunnel for the scan. The person running the machine will give you instructions while the scans are being done. You may be asked to: Keep your arms above your head. Hold your breath. Stay very still, even if the table is moving. When the scanning is complete, you will be moved out of the machine. The IV will be removed. The procedure may vary among health care providers and hospitals. What can I expect after the procedure? After your procedure, it is common to have: A metallic taste in your mouth from the contrast dye. A feeling of warmth. A headache from the nitroglycerin. Follow these instructions at home: Take  over-the-counter and prescription medicines only as told by your health care provider. If you are told, drink enough fluid to keep your urine pale yellow. This will help to flush the contrast dye out of your body. Most people can return to their normal activities right after the procedure. Ask your health care provider what activities are safe for you. It is up to you to get the results of your procedure. Ask your health care provider, or the department that is doing the procedure, when your results will be ready. Keep all follow-up visits as told by your health care provider. This is important. Contact a health care provider if: You have any symptoms of allergy to the contrast dye. These include: Shortness of breath. Rash or hives. A racing heartbeat. Summary A cardiac CT angiogram is a procedure to look at the heart and the area around the heart. It may be done to help find the cause of chest pains or other symptoms of heart disease. During this procedure, a large X-ray machine, called a CT scanner, takes detailed pictures of the heart and the surrounding area after a contrast dye has been injected into blood vessels in the area. Ask your health care provider about changing or stopping your regular medicines before the procedure. This is especially important if you are taking diabetes medicines, blood thinners, or medicines to treat erectile dysfunction. If you are told, drink enough fluid to keep your urine pale yellow. This will help to flush the contrast dye out of your body. This information is not intended to replace advice given to you by your health care provider. Make sure you discuss any questions you have with your health care provider. Document Revised: 08/10/2019 Document Reviewed: 08/10/2019 Elsevier Patient Education  2020 [employer].

## 2024-01-19 NOTE — Progress Notes (Signed)
Cardiology Consultation:    Date:  01/19/2024   ID:  Kathleen Le, DOB 24-Sep-1961, MRN [id number]  PCP:  Jomarie Longs, PA-C  Cardiologist:  Luretha Murphy, MD   Referring MD: Jomarie Longs, PA-C   No chief complaint on file.    ASSESSMENT AND PLAN:   Kathleen Le 63 year old woman history of diabetes mellitus, COPD on home O2, obesity, hyperlipidemia smokes cigarettes, coronary atherosclerosis and aortic atherosclerosis noted on CT imaging of the chest from October 2024, dilated pulmonary trunk noted on the CT chest from October 2024 suggestive of pulmonary hypertension. Now evaluated in the setting of abnormal stress test with nuclear imaging done for further evaluation of dyspnea on exertion and atypical chest discomfort that she has been describing for few months.  Problem List Items Addressed This Visit     Hyperlipidemia   Last lipid panel from 02-02-2023 with total cholesterol 162, HDL 57, triglycerides 188, LDL 77, suboptimal. Target LDL below 70 mg/dL in the setting of diabetes.  Currently on rosuvastatin 20 mg once daily, titrate up the dose to 40 mg once daily       Relevant Medications   metoprolol tartrate (LOPRESSOR) 100 MG tablet   rosuvastatin (CRESTOR) 40 MG tablet   Coronary atherosclerosis as evidenced on CT chest imaging from October 2024   Coronary atherosclerosis on prior CT imaging of the chest. Symptoms of shortness of breath and abnormal Lexiscan stress test with nuclear imaging.  Has significant cardiovascular risk factors. Continue with aspirin 81 mg once daily Titrate up rosuvastatin 40 mg once daily.  She is on amlodipine 2.5 mg once daily Atenolol 50 mg once daily.  Alternately her symptoms could be related noncardiac related to underlying severe COPD and elevated pulmonary pressures. If no significant obstructive coronary artery disease noted on CT coronary angiogram, continue following up with PCP and pulmonologist to optimize her  COPD therapy.        Relevant Medications   metoprolol tartrate (LOPRESSOR) 100 MG tablet   rosuvastatin (CRESTOR) 40 MG tablet   Hypertension   Relatively well controlled blood pressures. Mentions blood pressure is well-controlled at home measurement 2. Continue atenolol 50 mg once daily Continue with amlodipine 2.5 mg once daily Continue lisinopril 20 mg once daily and Continue hydrochlorothiazide 25 mg once daily       Relevant Medications   metoprolol tartrate (LOPRESSOR) 100 MG tablet   rosuvastatin (CRESTOR) 40 MG tablet   Abnormal nuclear stress test, Lexiscan 12-10-2023 basal septal wall perfusion defects suggestive of ischemia - Primary   Reviewed results from the stress test. Further evaluation with CT coronary angiogram discussed.  Alternative approaches for cardiac catheterization coronary angiogram also reviewed. Other noninvasive methods with stress echocardiogram would not be an ideal approach given technically difficult transthoracic echocardiogram images recently.   Decided to proceed with CT coronary angiogram for further evaluation of CAD.  To hold atenolol on the morning of the test and take metoprolol tartrate 100 mg to help with heart rate optimization and further test.        Relevant Orders   EKG 12-Lead (Completed)   CT CORONARY MORPH W/CTA COR W/SCORE W/CA W/CM &/OR WO/CM   Basic metabolic panel   Return to clinic based on test results, will tentatively arrange for a follow-up visit in 3 months.   History of Present Illness:    Kathleen Le is a 63 y.o. female who is being seen today for the evaluation of abnormal Lexiscan stress test with  nuclear imaging from 12/10/2023 at the request of Jomarie Longs, PA-C.   History of diabetes mellitus, COPD-uses nasal flow O2 2 L/min at rest and 3 L during ambulation, obesity, hyperlipidemia, CT imaging of the chest October 2024 noted aortic and coronary atherosclerosis, dilated pulmonary trunk  suggestive of pulmonary arterial hypertension, smokes cigarettes [smoking since age 88 and is working on quitting down to 1 to 2 cigarettes a day now]  Pleasant woman here for the visit by herself.  Lives with her husband at home.  Mentions her husband is a Naval architect and is gone for extended hours from early morning till late in the night and she spends most of her days by herself.  Uses oxygen at home at baseline but over the last couple weeks mentions that she has not required to use oxygen during daytime and she has not even been using it at night.  She reportedly has shortness of breath with mild to moderate exertion at baseline.  Denies any symptoms of chest pain or shortness of breath at rest. Denies any palpitations. Denies any blood in urine or stools.  She reported brief symptoms of bilateral lower extremity edema few months ago but this has since improved since her cut back on salt intake and good blood pressure control.  EKG in the clinic today shows sinus rhythm heart rate 77/min, PR interval normal 158 ms, QRS duration 82 ms, QTc normal 441 ms.  Lexiscan stress test with nuclear imaging from 12/10/2023 reported areas of basal anteroseptal wall peri-infarct ischemia and small area of ischemia in the basal inferoseptal segment and noted as intermediate risk study.  LVEF by gated SPECT imaging was reported 45%.  LV function recently by transthoracic echocardiogram showed normal biventricular function.  Transthoracic echocardiogram 11/16/2023 was a technically difficult study with LVEF reported 55 to 60%, normal RV size and function, trace MR,  CT imaging of the chest from 11/06/2023 reported aortic atherosclerosis and coronary atherosclerosis.  Pulmonary trunk was noted to be dilated.  No pericardial effusion.  5 mm left lower lobe nodule felt to be low risk and no follow-up was recommended.  Point-of-care hemoglobin A1c from 11/20/2023 was elevated 7.8. Last lipid panel available to  review is from 02-02-2023 with total cholesterol 162, HDL 57, triglycerides 188, LDL 77, suboptimal in the setting of her risk factors with diabetes.   Past Medical History:  Diagnosis Date   Abnormal weight gain 06/08/2018   Acute left-sided low back pain without sciatica 12/31/2021   Acute stress reaction 09/12/2018   Anterolisthesis 07/30/2020   Anxiety    Aortic atherosclerosis (HCC) 11/10/2023   Arthritis    Asthma    Bursitis of elbow 05/28/2022   Cancer (HCC)    uterine   Centrilobular emphysema (HCC) 05/09/2022   Cerumen impaction 01/24/2015   Cervical cancer (HCC) 02/05/2014   Pt reported 22 years ago. Last pap normal per pt and 2013.  Atypical squamous cells on pap, 12/2014. Will send for biopsy.       Chronic obstructive pulmonary disease (HCC) 02/26/2021   Chronic respiratory failure with hypoxia (HCC) 01/16/2023   Class 3 severe obesity due to excess calories with serious comorbidity and body mass index (BMI) of 45.0 to 49.9 in adult [facility]) 08/31/2017   Claustrophobia 06/08/2018   Contusion of left arm 12/31/2021   COPD exacerbation (HCC) 09/02/2022   Coronary artery disease due to lipid rich plaque 11/10/2023   Current smoker 06/08/2018   DDD (degenerative disc disease), lumbar 05/19/2014  L5-S1 anterolisthesis 9mm.      Degenerative disc disease, lumbar    Depression    Diabetes mellitus without complication (HCC)    Type II   Diabetes mellitus, type II, insulin dependent (HCC) 06/08/2018   Dyslipidemia, goal LDL below 70 08/21/2023   Dyspnea    with exertion due to COPD   Elbow wound, left, sequela 05/11/2022   Encounter for smoking cessation counseling 11/15/2021   Essential hypertension, benign 02/03/2014   Fall 12/31/2021   Foot callus 10/03/2014   GAD (generalized anxiety disorder) 10/27/2017   Gastroesophageal reflux disease with esophagitis 01/10/2015   GERD (gastroesophageal reflux disease)    History of alcohol abuse    20-30 years ago, patient was  going through divorce but does not currently drink any alcohol   History of kidney stones    passed   History of marijuana use    "its been a long time since I last smoked because of my COPD"   History of ovarian cancer 1984   History of substance abuse (HCC) 10/27/2017   Hot flashes 06/08/2018   Hyperlipidemia 01/24/2015   Hyperlipidemia associated with type 2 diabetes mellitus (HCC) 11/05/2020   Hypertension    Hypoglycemic reaction 05/20/2023   Hypotension 01/23/2023   Insomnia due to other mental disorder 10/27/2017   IT band syndrome 02/05/2014   Left lower lobe pulmonary nodule 11/11/2023   5mm nodule on CT 10/2023.  Follow up yearly.      Lower leg edema 11/10/2017   Memory changes 02/05/2014   Moderate episode of recurrent major depressive disorder (HCC) 08/21/2023   Nasal congestion 11/20/2023   Neck pain 12/31/2021   Neuropathy 02/05/2014   Non-restorative sleep 11/05/2020   Peripheral neuropathy    Pneumonia    Primary insomnia 06/08/2018   PTSD (post-traumatic stress disorder) 09/26/2014   Pulmonary emphysema (HCC) 07/26/2012   Overview:   ICD-10 cut over      Pulmonary hypertension (HCC) 11/11/2023   Rib injury 09/26/2014   Rib pain on left side 12/04/2023   Seborrheic keratoses 05/20/2023   Septic bursitis of elbow 10/24/2022   Sinus tachycardia 02/05/2014   Snoring 11/05/2020   SOB (shortness of breath) 10/13/2023   Substance abuse (HCC)    patient denies, states she has only ever used marijuana   Swelling of both lower extremities    Tobacco abuse 01/16/2023   Triceps tendon rupture, left, initial encounter 02/07/2022   Word finding difficulty 02/05/2014    Past Surgical History:  Procedure Laterality Date   APPLICATION OF WOUND VAC Left 12/17/2022   Procedure: APPLICATION OF WOUND VAC;  Surgeon: Nadara Mustard, MD;  Location: [facility] OR;  Service: Orthopedics;  Laterality: Left;   CERVICAL CONIZATION W/BX  1985   DILATION AND CURETTAGE OF UTERUS      ECTOPIC PREGNANCY SURGERY     patient stated she had to have tube removed   HARDWARE REMOVAL Left 09/17/2022   Procedure: LEFT ELBOW HARDWARE REMOVAL;  Surgeon: Tarry Kos, MD;  Location: [facility] ORS;  Service: Orthopedics;  Laterality: Left;   I & D EXTREMITY Left 05/12/2022   Procedure: IRRIGATION AND DEBRIDEMENT LEFT ELBOW;  Surgeon: Tarry Kos, MD;  Location: [facility] OR;  Service: Orthopedics;  Laterality: Left;   I & D EXTREMITY Left 07/18/2022   Procedure: LEFT ELBOW IRRIGATION AND DEBRIDEMENT EXTREMITY;  Surgeon: Tarry Kos, MD;  Location: [facility];  Service: Orthopedics;  Laterality: Left;   I & D EXTREMITY  Left 10/24/2022   Procedure: LEFT ELBOW DEBRIDEMENT;  Surgeon: Nadara Mustard, MD;  Location: [facility] OR;  Service: Orthopedics;  Laterality: Left;   I & D EXTREMITY Left 10/29/2022   Procedure: LEFT ELBOW DEBRIDEMENT;  Surgeon: Nadara Mustard, MD;  Location: [facility] OR;  Service: Orthopedics;  Laterality: Left;   I & D EXTREMITY Left 12/17/2022   Procedure: LEFT ELBOW DEBRIDEMENT;  Surgeon: Nadara Mustard, MD;  Location: [facility] OR;  Service: Orthopedics;  Laterality: Left;   IRRIGATION AND DEBRIDEMENT ELBOW Left 09/17/2022   Procedure: LEFT IRRIGATION AND DEBRIDEMENT ELBOW;  Surgeon: Tarry Kos, MD;  Location: [facility];  Service: Orthopedics;  Laterality: Left;   TONSILLECTOMY     TRICEPS TENDON REPAIR Left 02/07/2022   Procedure: left triceps repair;  Surgeon: Tarry Kos, MD;  Location: [facility] OR;  Service: Orthopedics;  Laterality: Left;    Current Medications: Current Meds  Medication Sig   ACCU-CHEK GUIDE TEST test strip USE 1 STRIP TO CHECK GLUCOSE 4 TIMES DAILY   Accu-Chek Softclix Lancets lancets USE 1  TO CHECK GLUCOSE UP TO 4 TIMES DAILY   albuterol (PROVENTIL) (2.5 MG/3ML) 0.083% nebulizer solution USE 1 VIAL IN NEBULIZER EVERY 4 HOURS AS NEEDED FOR WHEEZING FOR SHORTNESS OF BREATH   albuterol (VENTOLIN HFA) 108 (90 Base) MCG/ACT inhaler INHALE 1 TO 2 PUFFS INTO LUNGS  EVERY 6 HOURS AS NEEDED FOR WHEEZING AND FOR SHORTNESS OF BREATH   AMBULATORY NON FORMULARY MEDICATION Blood sugar testing strips and lancets for TrueTrack glucometer.  Use to check blood sugar up to two times a day.  Dx: Type 2 Diabetes   AMBULATORY NON FORMULARY MEDICATION Please provide needles for Tresiba pens   amLODipine (NORVASC) 2.5 MG tablet Take 1 tablet by mouth once daily   aspirin 81 MG EC tablet Take 81 mg by mouth daily.   atenolol (TENORMIN) 50 MG tablet Take 1 tablet by mouth once daily   Blood Glucose Monitoring Suppl (ACCU-CHEK GUIDE ME) w/Device KIT USE TO CHECK BLOOD SUGAR UP TO 4 TIMES DAILY AS DIRECTED   Calcium Carbonate (CALCARB 600 PO) Take 600 mg by mouth daily.   doxepin (SINEQUAN) 25 MG capsule TAKE 1 TO 2 CAPSULES BY MOUTH AT BEDTIME AS NEEDED   Dulaglutide (TRULICITY) 0.75 MG/0.5ML SOAJ Inject 0.75 mg into the skin once a week.   DULoxetine (CYMBALTA) 30 MG capsule Take 1 capsule by mouth twice daily   esomeprazole (NEXIUM) 20 MG capsule Take 20 mg by mouth daily.   Flaxseed, Linseed, (FLAX SEED OIL) 1000 MG CAPS Take 1,000 mg by mouth daily.   fluticasone (FLONASE) 50 MCG/ACT nasal spray Place 2 sprays into both nostrils 2 (two) times daily.   Fluticasone-Umeclidin-Vilant (TRELEGY ELLIPTA) 100-62.5-25 MCG/ACT AEPB INHALE 1 PUFF INTO LUNGS ONCE DAILY   glipiZIDE (GLUCOTROL) 10 MG tablet Take 1 tablet (10 mg total) by mouth 2 (two) times daily before a meal.   hydrochlorothiazide (HYDRODIURIL) 25 MG tablet Take 1 tablet by mouth once daily   insulin NPH Human (NOVOLIN N RELION) 100 UNIT/ML injection Inject 10 units subcutaneously in the morning and 15 units at bedtime.   Insulin Syringe-Needle U-100 (INSULIN SYRINGE .3CC/31GX5/16") 31G X 5/16" 0.3 ML MISC Dx DM E11.9. Inject insulin daily at bedtime.   ipratropium-albuterol (DUONEB) 0.5-2.5 (3) MG/3ML SOLN USE 1 AMPULE IN NEBULIZER EVERY 6 HOURS AS NEEDED   lisinopril (ZESTRIL) 20 MG tablet Take 1 tablet (20 mg  total) by mouth daily.   MAGNESIUM GLUCONATE PO  Take 420 mg by mouth daily.   meloxicam (MOBIC) 15 MG tablet Take 1 tablet by mouth once daily   metFORMIN (GLUCOPHAGE) 1000 MG tablet TAKE 1 TABLET BY MOUTH TWICE DAILY WITH MEALS   metoprolol tartrate (LOPRESSOR) 100 MG tablet Take 1 tablet (100 mg total) by mouth once for 1 dose. Take 2 hours prior to your CT if your heart rate is greater than 55   Multiple Vitamins-Minerals (MULTIVITAMIN WITH MINERALS) tablet Take 1 tablet by mouth daily. Woman's +   nystatin (MYCOSTATIN) 100000 UNIT/ML suspension Take 5 mLs (500,000 Units total) by mouth 4 (four) times daily.   nystatin (MYCOSTATIN/NYSTOP) powder APPLY  POWDER TOPICALLY THREE TIMES DAILY   Omega-3 Fatty Acids (FISH OIL) 1000 MG CAPS Take 1,000 mg by mouth 2 (two) times daily.   OVER THE COUNTER MEDICATION Apply 1 application  topically at bedtime as needed (CBD Cream. Apply to feet).   pioglitazone (ACTOS) 45 MG tablet Take 1 tablet by mouth once daily   Probiotic Product (PROBIOTIC DAILY PO) Take 2 each by mouth at bedtime. Gummies   rosuvastatin (CRESTOR) 40 MG tablet Take 1 tablet (40 mg total) by mouth daily.   sitaGLIPtin (JANUVIA) 100 MG tablet Take 1 tablet (100 mg total) by mouth daily.   [DISCONTINUED] rosuvastatin (CRESTOR) 20 MG tablet Take 1 tablet by mouth once daily     Allergies:   Gabapentin, Invokana [canagliflozin], Latex, and Lyrica [pregabalin]   Social History   Socioeconomic History   Marital status: Married    Spouse name: Not on file   Number of children: Not on file   Years of education: Not on file   Highest education level: 10th grade  Occupational History   Not on file  Tobacco Use   Smoking status: Every Day    Current packs/day: 0.25    Average packs/day: 1.9 packs/day for 47.1 years (90.5 ttl pk-yrs)    Types: Cigarettes    Start date: 35    Passive exposure: Never   Smokeless tobacco: Never   Tobacco comments:    Smokes 2 cigarettes/day   Vaping Use   Vaping status: Never Used  Substance and Sexual Activity   Alcohol use: Not Currently   Drug use: Not Currently    Types: Marijuana    Comment: 10/23/22- has not used since 2019   Sexual activity: Yes    Birth control/protection: Post-menopausal  Other Topics Concern   Not on file  Social History Narrative   Not on file   Social Drivers of Health   Financial Resource Strain: Low Risk  (10/12/2023)   Overall Financial Resource Strain (CARDIA)    Difficulty of Paying Living Expenses: Not very hard  Food Insecurity: No Food Insecurity (10/12/2023)   Hunger Vital Sign    Worried About Running Out of Food in the Last Year: Never true    Ran Out of Food in the Last Year: Never true  Transportation Needs: No Transportation Needs (10/12/2023)   PRAPARE - Administrator, Civil Service (Medical): No    Lack of Transportation (Non-Medical): No  Physical Activity: Insufficiently Active (10/12/2023)   Exercise Vital Sign    Days of Exercise per Week: 2 days    Minutes of Exercise per Session: 20 min  Stress: Stress Concern Present (10/12/2023)   [employer] of Occupational Health - Occupational Stress Questionnaire    Feeling of Stress : Rather much  Social Connections: Moderately Isolated (10/12/2023)   Social Connection and Isolation  Panel [NHANES]    Frequency of Communication with Friends and Family: Three times a week    Frequency of Social Gatherings with Friends and Family: Twice a week    Attends Religious Services: Never    Database administrator or Organizations: No    Attends Engineer, structural: Not on file    Marital Status: Married     Family History: The patient's family history includes Cancer in her maternal grandmother, maternal uncle, mother, and paternal grandmother; Diabetes in her father, paternal grandfather, paternal grandmother, paternal uncle, and paternal uncle; Heart attack in her father, maternal aunt, and mother;  Stroke in her mother. ROS:   Please see the history of present illness.    All 14 point review of systems negative except as described per history of present illness.  EKGs/Labs/Other Studies Reviewed:    The following studies were reviewed today:   EKG:  EKG Interpretation Date/Time:  Tuesday January 19 2024 15:15:38 EST Ventricular Rate:  77 PR Interval:  158 QRS Duration:  82 QT Interval:  390 QTC Calculation: 441 R Axis:   76  Text Interpretation: Normal sinus rhythm Normal ECG When compared with ECG of 03-Feb-2022 13:48, Borderline criteria for Anterior infarct are no longer Present Confirmed by Huntley Dec reddy [phone]) on 01/19/2024 3:22:30 PM    Recent Labs: 02/02/2023: ALT 12; BUN 25; Potassium 4.4; Sodium 142 10/20/2023: Creatinine, Ser 1.50  Recent Lipid Panel    Component Value Date/Time   CHOL 162 02/02/2023 0952   TRIG 188 (H) 02/02/2023 0952   HDL 57 02/02/2023 0952   CHOLHDL 2.8 02/02/2023 0952   VLDL 29 08/26/2017 0917   LDLCALC 77 02/02/2023 0952    Physical Exam:    VS:  BP 134/68   Ht 5\' 5"  (1.651 m)   Wt 292 lb 1.3 oz (132.5 kg)   SpO2 94%   BMI 48.60 kg/m     Wt Readings from Last 3 Encounters:  01/19/24 292 lb 1.3 oz (132.5 kg)  12/09/23 299 lb (135.6 kg)  12/09/23 299 lb (135.6 kg)     GENERAL:  Well nourished, well developed in no acute distress NECK: No JVD; No carotid bruits CARDIAC: RRR, S1 and S2 present muffled due to body habitus, no murmurs, no rubs, no gallops CHEST:  Clear to auscultation without rales, wheezing or rhonchi  Extremities: No pitting pedal edema. Pulses bilaterally symmetric with radial 2+ and dorsalis pedis 2+ NEUROLOGIC:  Alert and oriented x 3  Medication Adjustments/Labs and Tests Ordered: Current medicines are reviewed at length with the patient today.  Concerns regarding medicines are outlined above.  Orders Placed This Encounter  Procedures   CT CORONARY MORPH W/CTA COR W/SCORE W/CA W/CM &/OR  WO/CM   Basic metabolic panel   EKG 12-Lead   Meds ordered this encounter  Medications   metoprolol tartrate (LOPRESSOR) 100 MG tablet    Sig: Take 1 tablet (100 mg total) by mouth once for 1 dose. Take 2 hours prior to your CT if your heart rate is greater than 55    Dispense:  1 tablet    Refill:  0   rosuvastatin (CRESTOR) 40 MG tablet    Sig: Take 1 tablet (40 mg total) by mouth daily.    Dispense:  90 tablet    Refill:  3    Signed, Areona Homer reddy Ladine Kiper, MD, MPH, [facility]. 01/19/2024 3:48 PM    [facility]

## 2024-01-19 NOTE — Assessment & Plan Note (Addendum)
Coronary atherosclerosis on prior CT imaging of the chest. Symptoms of shortness of breath and abnormal Lexiscan stress test with nuclear imaging.  Has significant cardiovascular risk factors. Continue with aspirin 81 mg once daily Titrate up rosuvastatin 40 mg once daily.  She is on amlodipine 2.5 mg once daily Atenolol 50 mg once daily.  Alternately her symptoms could be related noncardiac related to underlying severe COPD and elevated pulmonary pressures. If no significant obstructive coronary artery disease noted on CT coronary angiogram, continue following up with PCP and pulmonologist to optimize her COPD therapy.

## 2024-01-20 LAB — BASIC METABOLIC PANEL
BUN/Creatinine Ratio: 23 (ref 12–28)
BUN: 25 mg/dL (ref 8–27)
CO2: 24 mmol/L (ref 20–29)
Calcium: 9 mg/dL (ref 8.7–10.3)
Chloride: 102 mmol/L (ref 96–106)
Creatinine, Ser: 1.09 mg/dL — ABNORMAL HIGH (ref 0.57–1.00)
Glucose: 191 mg/dL — ABNORMAL HIGH (ref 70–99)
Potassium: 4.8 mmol/L (ref 3.5–5.2)
Sodium: 142 mmol/L (ref 134–144)
eGFR: 58 mL/min/{1.73_m2} — ABNORMAL LOW (ref >59)

## 2024-01-20 MED ORDER — ALBUTEROL SULFATE HFA 108 (90 BASE) MCG/ACT IN AERS: 1.0000 | INHALATION_SPRAY | Freq: Four times a day (QID) | RESPIRATORY_TRACT | 0 refills | Status: DC | PRN

## 2024-01-20 MED ORDER — METFORMIN HCL 1000 MG PO TABS: 1000.0000 mg | ORAL_TABLET | Freq: Two times a day (BID) | ORAL | 0 refills | Status: DC

## 2024-01-25 ENCOUNTER — Encounter: Payer: Self-pay | Admitting: Physician Assistant

## 2024-01-25 DIAGNOSIS — J9690 Respiratory failure, unspecified, unspecified whether with hypoxia or hypercapnia: Secondary | ICD-10-CM | POA: Diagnosis not present

## 2024-01-31 ENCOUNTER — Other Ambulatory Visit: Payer: Self-pay | Admitting: Physician Assistant

## 2024-01-31 DIAGNOSIS — I10 Essential (primary) hypertension: Secondary | ICD-10-CM

## 2024-02-08 ENCOUNTER — Other Ambulatory Visit: Payer: Self-pay | Admitting: Physician Assistant

## 2024-02-10 ENCOUNTER — Other Ambulatory Visit: Payer: Self-pay | Admitting: Physician Assistant

## 2024-02-10 DIAGNOSIS — E1165 Type 2 diabetes mellitus with hyperglycemia: Secondary | ICD-10-CM

## 2024-02-12 ENCOUNTER — Other Ambulatory Visit: Payer: Self-pay | Admitting: Physician Assistant

## 2024-02-12 DIAGNOSIS — E1165 Type 2 diabetes mellitus with hyperglycemia: Secondary | ICD-10-CM

## 2024-02-12 DIAGNOSIS — E119 Type 2 diabetes mellitus without complications: Secondary | ICD-10-CM

## 2024-02-15 ENCOUNTER — Telehealth (HOSPITAL_COMMUNITY): Payer: Self-pay | Admitting: Emergency Medicine

## 2024-02-15 NOTE — Telephone Encounter (Signed)
 Reaching out to patient to offer assistance regarding upcoming cardiac imaging study; pt verbalizes understanding of appt date/time, parking situation and where to check in, pre-test NPO status and medications ordered, and verified current allergies; name and call back number provided for further questions should they arise Rockwell Alexandria RN Navigator Cardiac Imaging [facility] [phone] office [phone] cell

## 2024-02-15 NOTE — Telephone Encounter (Signed)
 Attempted to call patient regarding upcoming cardiac CT appointment. Left message on voicemail with name and callback number Rockwell Alexandria RN Navigator Cardiac Imaging [facility] [phone] Office [phone] Cell

## 2024-02-16 ENCOUNTER — Encounter (HOSPITAL_BASED_OUTPATIENT_CLINIC_OR_DEPARTMENT_OTHER): Payer: Self-pay

## 2024-02-16 ENCOUNTER — Ambulatory Visit (HOSPITAL_BASED_OUTPATIENT_CLINIC_OR_DEPARTMENT_OTHER)
Admission: RE | Admit: 2024-02-16 | Discharge: 2024-02-16 | Disposition: A | Discharge status: Home / Self Care | Payer: 59 | Source: Ambulatory Visit

## 2024-02-16 DIAGNOSIS — R9439 Abnormal result of other cardiovascular function study: Secondary | ICD-10-CM | POA: Insufficient documentation

## 2024-02-16 MED ORDER — NITROGLYCERIN 0.4 MG SL SUBL
SUBLINGUAL_TABLET | SUBLINGUAL | Status: AC
  Filled 2024-02-16: qty 2

## 2024-02-16 MED ORDER — IOHEXOL 350 MG/ML SOLN
100.0000 mL | Freq: Once | INTRAVENOUS | Status: AC | PRN
  Administered 2024-02-16: 110 mL via INTRAVENOUS

## 2024-02-19 ENCOUNTER — Ambulatory Visit: Payer: 59 | Admitting: Physician Assistant

## 2024-02-19 ENCOUNTER — Encounter: Payer: Self-pay | Admitting: Physician Assistant

## 2024-02-19 VITALS — BP 134/74 | HR 71 | Ht 65.0 in | Wt 289.8 lb

## 2024-02-19 DIAGNOSIS — G629 Polyneuropathy, unspecified: Secondary | ICD-10-CM | POA: Diagnosis not present

## 2024-02-19 DIAGNOSIS — Z79899 Other long term (current) drug therapy: Secondary | ICD-10-CM

## 2024-02-19 DIAGNOSIS — I700 Atherosclerosis of aorta: Secondary | ICD-10-CM

## 2024-02-19 DIAGNOSIS — M5136 Other intervertebral disc degeneration, lumbar region with discogenic back pain only: Secondary | ICD-10-CM | POA: Diagnosis not present

## 2024-02-19 DIAGNOSIS — J432 Centrilobular emphysema: Secondary | ICD-10-CM

## 2024-02-19 DIAGNOSIS — Z1231 Encounter for screening mammogram for malignant neoplasm of breast: Secondary | ICD-10-CM

## 2024-02-19 DIAGNOSIS — N1831 Chronic kidney disease, stage 3a: Secondary | ICD-10-CM | POA: Diagnosis not present

## 2024-02-19 DIAGNOSIS — J9611 Chronic respiratory failure with hypoxia: Secondary | ICD-10-CM | POA: Diagnosis not present

## 2024-02-19 DIAGNOSIS — E1165 Type 2 diabetes mellitus with hyperglycemia: Secondary | ICD-10-CM

## 2024-02-19 DIAGNOSIS — I10 Essential (primary) hypertension: Secondary | ICD-10-CM | POA: Diagnosis not present

## 2024-02-19 DIAGNOSIS — I2510 Atherosclerotic heart disease of native coronary artery without angina pectoris: Secondary | ICD-10-CM

## 2024-02-19 DIAGNOSIS — Z7984 Long term (current) use of oral hypoglycemic drugs: Secondary | ICD-10-CM

## 2024-02-19 DIAGNOSIS — M5441 Lumbago with sciatica, right side: Secondary | ICD-10-CM | POA: Diagnosis not present

## 2024-02-19 DIAGNOSIS — G8929 Other chronic pain: Secondary | ICD-10-CM

## 2024-02-19 MED ORDER — FLUCONAZOLE 150 MG PO TABS: 150.0000 mg | ORAL_TABLET | Freq: Once | ORAL | 0 refills | Status: AC

## 2024-02-19 MED ORDER — DAPAGLIFLOZIN PROPANEDIOL 10 MG PO TABS: 10.0000 mg | ORAL_TABLET | Freq: Every day | ORAL | 0 refills | Status: DC

## 2024-02-19 MED ORDER — TRULICITY 1.5 MG/0.5ML ~~LOC~~ SOAJ: 1.5000 mg | SUBCUTANEOUS | 0 refills | Status: DC

## 2024-02-19 NOTE — Patient Instructions (Addendum)
Stop [place].  Increased trulicity to 1.5mg  weekly Add farxiga daily for diabetes control and kidney protection  Chronic Kidney Disease: Eating Plan Chronic kidney disease (CKD) is when your kidneys aren't working well. They can't remove waste, fluids, and other substances from your blood. When these substances build up, they can worsen kidney damage and affect your health. Eating certain foods can lead to a buildup of these substances. Changing your diet can help prevent more kidney damage. Diet changes may also delay dialysis or even keep you from needing it. What nutrients should I limit? Work with your health care team and an expert in healthy eating (dietitian) to make a meal plan that's right for you. Foods you can eat and foods you should limit or avoid will depend on: The stage of your kidney disease. Any other conditions you have. The items listed below are not a complete list. Talk with a dietitian to learn what's best for you. Potassium Potassium affects how well your heart beats. Too much potassium in your blood can cause an irregular heartbeat or even a heart attack. You may need to limit foods that are high in potassium, such as: Liquid milk and soy milk. Salt substitutes that contain potassium. Fruits like: Bananas. Apricots. Melon. Prunes and raisins. Kiwi. Nectarines and oranges. Vegetables, such as: Potatoes, sweet potatoes, and yams. Tomatoes. Leafy greens. Beets. Avocado. Pumpkin and winter squash. Beans, like lima beans. Nuts. Phosphorus Phosphorus is a mineral found in your bones. You need a balance between calcium and phosphorus to build and maintain healthy bones.  Too much added phosphorus from the foods you eat can pull calcium from your bones. Losing calcium can make your bones weak and more likely to break. Too much phosphorus can also make your skin itch. You may need to limit foods that are high in phosphorus or that have added phosphorus, such  as: Liquid milk and dairy products. Dark-colored sodas or soft drinks. Bran cereals and oatmeal. Protein  Protein helps your body make and keep muscle. Protein also helps to repair your body's cells and tissues.  One of the natural breakdown products of protein is a waste product called urea. When your kidneys aren't working well, they can't remove waste like urea. Reducing protein in your diet can help keep urea from building up in your blood. Depending on your stage of kidney disease, you may need to eat smaller portions of foods that are high in protein. Sources of animal protein include: Meat (all types). Fish and seafood. Poultry. Eggs. Dairy. Other protein foods include: Beans and legumes. Nuts and nut butter. Soy, like tofu.  Sodium Salt (sodium) helps to keep a healthy balance of fluids in your body. Too much salt can increase your blood pressure, which can harm your heart and lungs. Extra salt can also cause your body to keep too much fluid, making your kidneys work harder. You may need to limit or avoid foods that are high in salt, such as: Salt seasonings. Soy and teriyaki sauce. Meats that are: Packaged. Precooked. Cured. Processed. Salted crackers and snack foods. Fast food. Canned soups and foods. Pickled foods. Boxed mixes or ready-to-eat boxed meals and side dishes. Bottled dressings, sauces, and marinades. Talk with your dietitian about how much potassium, phosphorus, protein, and salt you may have each day. What are tips for following this plan? Reading food labels  Check the amount of salt in foods. Limit foods that have salt listed among the first five ingredients. Try to eat low-salt  foods. Check the ingredient list for added phosphorus or potassium. "Phos" in an ingredient is a sign that phosphorus has been added. Do not buy foods that are calcium-enriched or that have calcium added to them (fortified). Buy canned vegetables and beans that say "no salt  added." Rinse them before eating. Lifestyle Limit the amount of protein you eat from animal sources each day. Focus on protein from plant sources, like tofu and dried beans, peas, and lentils. Do not add salt to food when cooking or before eating. Do not eat star fruit. It can be toxic for people with kidney problems. Talk with your health care provider before taking any vitamin or mineral supplements. If told by your provider: Track how much liquid you have so you can avoid drinking too much. Try to eat foods that are made mostly from water, like gelatin, ice cream, soups, and juicy fruits and vegetables. If you have diabetes and chronic kidney disease: If you have diabetes and CKD, you need to keep your blood sugar (glucose) in the target range recommended by your provider. Follow your diabetes management plan. This may include: Checking your blood glucose regularly. Taking medicines by mouth, or taking insulin, or both. Exercising for at least 30 minutes on 5 or more days each week, or as told by your provider. Tracking how many servings of carbohydrates you eat at each meal. Not using orange juice to treat low blood sugars. Instead, use apple juice, cranberry juice, or clear soda. You may be given guidelines on what foods and nutrients you may eat, and how much you can have each day. This depends on your stage of kidney disease and whether you have high blood pressure. Follow the meal plan your dietitian gives you. Where to find more information [employer] and [employer]estive [employer] Kidney Diseases: [employer] National Kidney Foundation: kidney.org This information is not intended to replace advice given to you by your health care provider. Make sure you discuss any questions you have with your health care provider. Document Revised: 07/28/2023 Document Reviewed: 07/28/2023 Elsevier Patient Education  2024 [employer].

## 2024-02-19 NOTE — Progress Notes (Signed)
 Established Patient Office Visit  Subjective   Patient ID: Kathleen Le, female    DOB: [birth year]  Age: 63 y.o. MRN: [id number]  CC: medication refills and diabetes management   HPI 63 yo female presents today for diabetic follow up. She has been taking Trulicity 0.75mg  once weekly, Januvia 100mg  daily, Glipizide 10mg  daily, Metformin 1000mg  BID. She has been checking her sugars at home - late in the afternoons after eating they have been in the 200s. In the mornings, they are anywhere from 80-115. She did have one morning where she woke up and her blood sugar was 41, but she ate some chocolate and that brought it back up. She has not been using any insulin at night. She uses insulin 10-12 units in the mornings around 10-11am and that is all for the day. She has not had a low since.   Her cardiologist bumped up her rosuvastatin to 40mg  daily. She wants to know if this is okay.   She would like you to review CT/CTA w/ calcium score.   Her breathing is doing well. Just using O2 at night.   .. Active Ambulatory Problems    Diagnosis Date Noted   Type II diabetes mellitus, uncontrolled 02/03/2014   Asthma 02/03/2014   Essential hypertension, benign 02/03/2014   Neuropathy 02/05/2014   IT band syndrome 02/05/2014   Sinus tachycardia 02/05/2014   Memory changes 02/05/2014   Word finding difficulty 02/05/2014   Cervical cancer (HCC) 02/05/2014   Depression 05/19/2014   Anxiety 05/19/2014   DDD (degenerative disc disease), lumbar 05/19/2014   Rib injury 09/26/2014   PTSD (post-traumatic stress disorder) 09/26/2014   Foot callus 10/03/2014   Gastroesophageal reflux disease with esophagitis 01/10/2015   Cerumen impaction 01/24/2015   Hyperlipidemia 01/24/2015   Pulmonary emphysema (HCC) 07/26/2012   Class 3 severe obesity due to excess calories with serious comorbidity and body mass index (BMI) of 45.0 to 49.9 in adult (HCC) 08/31/2017   Insomnia due to other mental disorder  10/27/2017   History of substance abuse (HCC) 10/27/2017   GAD (generalized anxiety disorder) 10/27/2017   Lower leg edema 11/10/2017   Current smoker 06/08/2018   Diabetes mellitus, type II, insulin dependent (HCC) 06/08/2018   Claustrophobia 06/08/2018   Primary insomnia 06/08/2018   Hot flashes 06/08/2018   Abnormal weight gain 06/08/2018   Acute stress reaction 09/12/2018   Anterolisthesis 07/30/2020   Hyperlipidemia associated with type 2 diabetes mellitus (HCC) 11/05/2020   Non-restorative sleep 11/05/2020   Snoring 11/05/2020   Chronic obstructive pulmonary disease (HCC) 02/26/2021   Encounter for smoking cessation counseling 11/15/2021   Fall 12/31/2021   Contusion of left arm 12/31/2021   Acute left-sided low back pain without sciatica 12/31/2021   Neck pain 12/31/2021   Triceps tendon rupture, left, initial encounter 02/07/2022   Centrilobular emphysema (HCC) 05/09/2022   Elbow wound, left, sequela 05/11/2022   Bursitis of elbow 05/28/2022   COPD exacerbation (HCC) 09/02/2022   Septic bursitis of elbow 10/24/2022   Tobacco abuse 01/16/2023   Chronic respiratory failure with hypoxia (HCC) 01/16/2023   Hypotension 01/23/2023   Hypoglycemic reaction 05/20/2023   Seborrheic keratoses 05/20/2023   Dyslipidemia, goal LDL below 70 08/21/2023   Moderate episode of recurrent major depressive disorder (HCC) 08/21/2023   SOB (shortness of breath) 10/13/2023   Aortic atherosclerosis (HCC) 11/10/2023   Coronary atherosclerosis as evidenced on CT chest imaging from October 2024 11/10/2023   Left lower lobe pulmonary nodule 11/11/2023   Pulmonary  hypertension (HCC) 11/11/2023   Nasal congestion 11/20/2023   Rib pain on left side 12/04/2023   Arthritis    Cancer (HCC)    Degenerative disc disease, lumbar    Diabetes mellitus without complication (HCC)    Dyspnea    GERD (gastroesophageal reflux disease)    History of alcohol abuse    History of kidney stones    History of  marijuana use    History of ovarian cancer 1984   Hypertension    Peripheral neuropathy    Pneumonia    Substance abuse (HCC)    Swelling of both lower extremities    Abnormal nuclear stress test, Lexiscan 12-10-2023 basal septal wall perfusion defects suggestive of ischemia 01/19/2024   Coronary artery disease due to lipid rich plaque 02/19/2024   Stage 3a chronic kidney disease (HCC) 02/22/2024   Resolved Ambulatory Problems    Diagnosis Date Noted   Chronic bilateral low back pain with right-sided sciatica 05/19/2014   Bruised rib 09/26/2014   Wound of right leg 09/26/2014   Essential hypertension 11/10/2017   Former smoker 03/09/2018   No Additional Past Medical History      Review of Systems  Respiratory:  Negative for shortness of breath.   Cardiovascular:  Negative for chest pain.  All other systems reviewed and are negative.    Objective:     BP 134/74 (BP Location: Left Arm, Patient Position: Sitting, Cuff Size: Large)   Pulse 71   Ht 5\' 5"  (1.651 m)   Wt 289 lb 12 oz (131.4 kg)   SpO2 93%   BMI 48.22 kg/m  BP Readings from Last 3 Encounters:  02/19/24 134/74  01/19/24 134/68  12/04/23 (!) 159/75   Wt Readings from Last 3 Encounters:  02/19/24 289 lb 12 oz (131.4 kg)  01/19/24 292 lb 1.3 oz (132.5 kg)  12/09/23 299 lb (135.6 kg)    Physical Exam Constitutional:      Appearance: She is obese.  HENT:     Head: Normocephalic and atraumatic.  Cardiovascular:     Rate and Rhythm: Normal rate and regular rhythm.     Heart sounds: Normal heart sounds.  Pulmonary:     Effort: Pulmonary effort is normal.  Musculoskeletal:     Cervical back: Normal range of motion and neck supple. No tenderness.     Right lower leg: No edema.     Left lower leg: No edema.  Lymphadenopathy:     Cervical: No cervical adenopathy.  Neurological:     General: No focal deficit present.     Mental Status: She is alert and oriented to person, place, and time.  Psychiatric:         Mood and Affect: Mood normal.    Last CBC Lab Results  Component Value Date   WBC 6.0 12/17/2022   HGB 10.4 (L) 12/17/2022   HCT 32.8 (L) 12/17/2022   MCV 89.4 12/17/2022   MCH 28.3 12/17/2022   RDW 14.6 12/17/2022   PLT 289 12/17/2022   Last metabolic panel Lab Results  Component Value Date   GLUCOSE 191 (H) 01/19/2024   NA 142 01/19/2024   K 4.8 01/19/2024   CL 102 01/19/2024   CO2 24 01/19/2024   BUN 25 01/19/2024   CREATININE 1.09 (H) 01/19/2024   EGFR 58 (L) 01/19/2024   CALCIUM 9.0 01/19/2024   PROT 6.7 02/02/2023   ALBUMIN 3.2 (L) 11/07/2022   BILITOT 0.2 02/02/2023   ALKPHOS 83 11/07/2022   AST  13 02/02/2023   ALT 12 02/02/2023   ANIONGAP 10 12/17/2022   Last lipids Lab Results  Component Value Date   CHOL 162 02/02/2023   HDL 57 02/02/2023   LDLCALC 77 02/02/2023   TRIG 188 (H) 02/02/2023   CHOLHDL 2.8 02/02/2023   Last hemoglobin A1c Lab Results  Component Value Date   HGBA1C 7.8 (A) 11/20/2023    The 10-year ASCVD risk score (Arnett DK, et al., 2019) is: 16.9%    Assessment & Plan:  Kathleen Le KitchenMarland KitchenAmunique was seen today for medical management of chronic issues.  Diagnoses and all orders for this visit:  Uncontrolled type 2 diabetes mellitus with hyperglycemia (HCC) -     Dulaglutide (TRULICITY) 1.5 MG/0.5ML SOAJ; Inject 1.5 mg into the skin once a week. -     dapagliflozin propanediol (FARXIGA) 10 MG TABS tablet; Take 1 tablet (10 mg total) by mouth daily. -     Hemoglobin A1c  Aortic atherosclerosis (HCC)  Coronary artery disease due to lipid rich plaque  Visit for screening mammogram -     MM 3D SCREENING MAMMOGRAM BILATERAL BREAST; Future  Chronic respiratory failure with hypoxia (HCC)  Centrilobular emphysema (HCC)  Essential hypertension, benign -     amLODipine (NORVASC) 2.5 MG tablet; Take 1 tablet (2.5 mg total) by mouth daily. -     atenolol (TENORMIN) 50 MG tablet; Take 1 tablet (50 mg total) by mouth daily.  Stage 3a chronic  kidney disease (HCC) -     dapagliflozin propanediol (FARXIGA) 10 MG TABS tablet; Take 1 tablet (10 mg total) by mouth daily.  Medication management -     fluconazole (DIFLUCAN) 150 MG tablet; Take 1 tablet (150 mg total) by mouth once for 1 dose. Repeat in 48 to 72 hours if symptoms persist.  Chronic bilateral low back pain with right-sided sciatica -     DULoxetine (CYMBALTA) 30 MG capsule; Take 1 capsule (30 mg total) by mouth 2 (two) times daily.  Degeneration of intervertebral disc of lumbar region with discogenic back pain -     DULoxetine (CYMBALTA) 30 MG capsule; Take 1 capsule (30 mg total) by mouth 2 (two) times daily.  Neuropathy -     DULoxetine (CYMBALTA) 30 MG capsule; Take 1 capsule (30 mg total) by mouth 2 (two) times daily.    Referral for mammogram Patient states will get DM eye exam this year.  Denies flu vaccine today and all vaccines today.  Discussed importance of kidney protection and decline with the patient Discussed other SGLT2-inhibitors that could help the patient with blood sugar management and kidney protection Discussed prescribing diflucan PRN for yeast infection if she does get one with starting this medication  Pt has hx of vaginitis with invokana, pt aware in same class but worth another try for the benefit HO given for CKD diet and discussed ways to prevent further decline Discussed the goal of eventually getting the patient off of insulin A1C goal < 7mg /dL Too soon to check today, lab order given to have done next week Continue long acting insulin just in am 10-12 units, continue to monitor glucose during the day if getting lows then start cutting back as we increase GLP-1 and add SGLT-2.  Discussed CTA/CT coronary calcium score Discussed calcium score in the 2000s with the patient and what this means Severe narrowing of coronary arteries and potential for heart catheterization Patient told to also talk to cardiologist about these results from  02/16/24.  Recommend patient continue rosuvastatin, ACE-I,  BB, Aspirin, and starting SGLT2 inhibitor Add SGLT2-inhibitor Increase Trulicity, stop [place] She has decreased her smoking - down to 1 cigarette a day  Mucinex and inhaler for cough 10. COPD controlled with Trelegy 11. Chronic respiratory failure-breathing better and using O2 only at night  Spent 60 minutes with patient in chart reviewing past imaging and discussing medications and plan.    Tandy Gaw, PA-C

## 2024-02-22 ENCOUNTER — Telehealth: Payer: Self-pay

## 2024-02-22 ENCOUNTER — Encounter: Payer: Self-pay | Admitting: Physician Assistant

## 2024-02-22 DIAGNOSIS — N1831 Chronic kidney disease, stage 3a: Secondary | ICD-10-CM | POA: Insufficient documentation

## 2024-02-22 HISTORY — DX: Chronic kidney disease, stage 3a: N18.31

## 2024-02-22 MED ORDER — DULOXETINE HCL 30 MG PO CPEP: 30.0000 mg | ORAL_CAPSULE | Freq: Two times a day (BID) | ORAL | 3 refills | Status: DC

## 2024-02-22 MED ORDER — AMLODIPINE BESYLATE 2.5 MG PO TABS
2.5000 mg | ORAL_TABLET | Freq: Every day | ORAL | 0 refills | Status: DC
Start: 2024-02-22 — End: 2024-07-04

## 2024-02-22 MED ORDER — ATENOLOL 50 MG PO TABS: 50.0000 mg | ORAL_TABLET | Freq: Every day | ORAL | 0 refills | Status: DC

## 2024-02-22 NOTE — Telephone Encounter (Signed)
-----   Message from Laureldale R Madireddy sent at 02/20/2024  9:57 AM EST ----- Please schedule her for an urgent follow up visit to review abnormal CT coronary angiogram, results to discuss further steps with cardiac catheterization versus medical therapy

## 2024-02-22 NOTE — Telephone Encounter (Signed)
 Left vm to return call.

## 2024-02-24 ENCOUNTER — Other Ambulatory Visit: Payer: Self-pay | Admitting: Physician Assistant

## 2024-02-24 MED ORDER — EMPAGLIFLOZIN 10 MG PO TABS: 10.0000 mg | ORAL_TABLET | Freq: Every day | ORAL | 1 refills | Status: DC

## 2024-02-25 ENCOUNTER — Other Ambulatory Visit: Payer: Self-pay

## 2024-02-25 DIAGNOSIS — J9690 Respiratory failure, unspecified, unspecified whether with hypoxia or hypercapnia: Secondary | ICD-10-CM | POA: Diagnosis not present

## 2024-02-26 ENCOUNTER — Encounter: Payer: Self-pay | Admitting: Physician Assistant

## 2024-03-02 ENCOUNTER — Ambulatory Visit: Payer: 59

## 2024-03-02 VITALS — BP 130/72 | HR 83 | Ht 65.0 in | Wt 283.0 lb

## 2024-03-02 DIAGNOSIS — I25119 Atherosclerotic heart disease of native coronary artery with unspecified angina pectoris: Secondary | ICD-10-CM | POA: Diagnosis not present

## 2024-03-02 NOTE — Progress Notes (Signed)
 Cardiology Consultation:    Date:  03/02/2024   ID:  [name], DOB 1961/10/22, MRN [id number]  PCP:  Jomarie Longs, PA-C  Cardiologist:  Luretha Murphy, MD   Referring MD: Jomarie Longs, PA-C   No chief complaint on file.    ASSESSMENT AND PLAN:   Kathleen Le 63 year old woman baseline dyspnea related to COPD, with progressive symptoms of shortness of breath noted to have abnormal stress test with nuclear imaging in December 2024 and CT coronary angiogram February 2025 with possible obstructive disease, diabetes mellitus, COPD on home O2, obesity, hyperlipidemia, smokes cigarettes [1 or 2 cigarettes a day], aortic atherosclerosis, small PFO noted on cardiac CT February 2025, CKD stage II/III. Here discussed further evaluation with cardiac catheterization today  Problem List Items Addressed This Visit     CAD (coronary artery disease), abnormal stress test December 2024, abnormal CT coronary angiogram CAD RADS 4 study and calcium score 2152, February 2025 - Primary   Continue aspirin 81 mg once daily Continue rosuvastatin 40 mg once daily.  On antianginal regimen with amlodipine 2.5 mg once daily, atenolol 50 mg once daily. Reviewed the results from her cardiac CT at length.  Further evaluation with cardiac catheterization discussed in the setting of CT coronary angiogram findings.  Shared Decision Making/Informed Consent{ The risks [stroke (1 in 1000), death (1 in 1000), kidney failure [usually temporary] (1 in 500), bleeding (1 in 200), allergic reaction [possibly serious] (1 in 200)], benefits (diagnostic support and management of coronary artery disease) and alternatives of a cardiac catheterization were discussed in detail with her and she is willing to proceed.  Tentatively to schedule at [facility]. She is aware of need to have a chaperone to drive her back home if being discharged the same day but she is aware to anticipate staying overnight in the  hospital.         Return to clinic tentatively in 4 weeks.   History of Present Illness:    Kathleen Le is a 63 y.o. female who is being seen today for follow-up visit. PCP is Breeback, [name] L, PA-C.   Coronary artery disease with abnormal stress test with nuclear imaging December 2024, now noted on CT coronary angiogram February 2025 with possible obstructive disease, diabetes mellitus, COPD on home O2, obesity, hyperlipidemia, smokes cigarettes [1 or 2 cigarettes a day], aortic atherosclerosis, small PFO noted on cardiac CT February 2025, CKD stage II/III  Lives with her husband at home.  He is a Naval architect and works extended hours.  Here for the visit by herself.  Mentions no significant chest pain symptoms but continues to feel shortness of breath with any amount of activity.  Continues to smoke usually 1 cigarette at bedtime.  Recently her diabetes regimen had been updated then has been started on Jardiance and taken off Januvia.  She is also on insulin, metformin, Actos, Trulicity.  Blood pressures are well-controlled on current regimen with amlodipine 2.5 mg, hydrochlorothiazide 25 mg once a day, lisinopril 20 mg once a day, atenolol 50 mg once daily.   CT coronary angiogram from 02-16-2024 noted severely elevated calcium score 2152, CAD RADS 4 disease mid LCx 70 to 99% lesion, moderate stenosis of mid LAD.  CT FFR could not be performed due to poor quality of the study.  Small PFO was reported.  Extracardiac findings not yet resulted.  Pending radiologist over read  Transthoracic echocardiogram November 2024 with LVEF 55 to 60%, normal diastolic parameters, normal  RV size and function, trace MR  EKG in the clinic today shows sinus rhythm, anteroseptal Q waves.  Heart rate 81/min.  QRS duration 78 ms.  Past Medical History:  Diagnosis Date   Abnormal nuclear stress test, Lexiscan 12-10-2023 basal septal wall perfusion defects suggestive of ischemia 01/19/2024   Abnormal  weight gain 06/08/2018   Acute left-sided low back pain without sciatica 12/31/2021   Acute stress reaction 09/12/2018   Anterolisthesis 07/30/2020   Anxiety    Aortic atherosclerosis (HCC) 11/10/2023   Arthritis    Asthma    Bursitis of elbow 05/28/2022   Cancer (HCC)    uterine   Centrilobular emphysema (HCC) 05/09/2022   Cerumen impaction 01/24/2015   Cervical cancer (HCC) 02/05/2014   Pt reported 22 years ago. Last pap normal per pt and 2013.  Atypical squamous cells on pap, 12/2014. Will send for biopsy.       Chronic obstructive pulmonary disease (HCC) 02/26/2021   Chronic respiratory failure with hypoxia (HCC) 01/16/2023   Class 3 severe obesity due to excess calories with serious comorbidity and body mass index (BMI) of 45.0 to 49.9 in adult [facility]) 08/31/2017   Claustrophobia 06/08/2018   Contusion of left arm 12/31/2021   COPD exacerbation (HCC) 09/02/2022   Coronary artery disease due to lipid rich plaque 11/10/2023   Coronary atherosclerosis as evidenced on CT chest imaging from October 2024 11/10/2023   Current smoker 06/08/2018   DDD (degenerative disc disease), lumbar 05/19/2014   L5-S1 anterolisthesis 9mm.      Degenerative disc disease, lumbar    Depression    Diabetes mellitus without complication (HCC)    Type II   Diabetes mellitus, type II, insulin dependent (HCC) 06/08/2018   Dyslipidemia, goal LDL below 70 08/21/2023   Dyspnea    with exertion due to COPD   Elbow wound, left, sequela 05/11/2022   Encounter for smoking cessation counseling 11/15/2021   Essential hypertension, benign 02/03/2014   Fall 12/31/2021   Foot callus 10/03/2014   GAD (generalized anxiety disorder) 10/27/2017   Gastroesophageal reflux disease with esophagitis 01/10/2015   GERD (gastroesophageal reflux disease)    History of alcohol abuse    20-30 years ago, patient was going through divorce but does not currently drink any alcohol   History of kidney stones    passed   History  of marijuana use    "its been a long time since I last smoked because of my COPD"   History of ovarian cancer 1984   History of substance abuse (HCC) 10/27/2017   Hot flashes 06/08/2018   Hyperlipidemia 01/24/2015   Hyperlipidemia associated with type 2 diabetes mellitus (HCC) 11/05/2020   Hypertension    Hypoglycemic reaction 05/20/2023   Hypotension 01/23/2023   Insomnia due to other mental disorder 10/27/2017   IT band syndrome 02/05/2014   Left lower lobe pulmonary nodule 11/11/2023   5mm nodule on CT 10/2023.  Follow up yearly.      Lower leg edema 11/10/2017   Memory changes 02/05/2014   Moderate episode of recurrent major depressive disorder (HCC) 08/21/2023   Nasal congestion 11/20/2023   Neck pain 12/31/2021   Neuropathy 02/05/2014   Non-restorative sleep 11/05/2020   Peripheral neuropathy    Pneumonia    Primary insomnia 06/08/2018   PTSD (post-traumatic stress disorder) 09/26/2014   Pulmonary emphysema (HCC) 07/26/2012   Overview:   ICD-10 cut over      Pulmonary hypertension (HCC) 11/11/2023   Rib injury 09/26/2014  Rib pain on left side 12/04/2023   Seborrheic keratoses 05/20/2023   Septic bursitis of elbow 10/24/2022   Sinus tachycardia 02/05/2014   Snoring 11/05/2020   SOB (shortness of breath) 10/13/2023   Stage 3a chronic kidney disease (HCC) 02/22/2024   Substance abuse (HCC)    patient denies, states she has only ever used marijuana   Swelling of both lower extremities    Tobacco abuse 01/16/2023   Triceps tendon rupture, left, initial encounter 02/07/2022   Word finding difficulty 02/05/2014    Past Surgical History:  Procedure Laterality Date   APPLICATION OF WOUND VAC Left 12/17/2022   Procedure: APPLICATION OF WOUND VAC;  Surgeon: Nadara Mustard, MD;  Location: [facility] OR;  Service: Orthopedics;  Laterality: Left;   CERVICAL CONIZATION W/BX  1985   DILATION AND CURETTAGE OF UTERUS     ECTOPIC PREGNANCY SURGERY     patient stated she had to have  tube removed   HARDWARE REMOVAL Left 09/17/2022   Procedure: LEFT ELBOW HARDWARE REMOVAL;  Surgeon: Tarry Kos, MD;  Location: [facility] ORS;  Service: Orthopedics;  Laterality: Left;   I & D EXTREMITY Left 05/12/2022   Procedure: IRRIGATION AND DEBRIDEMENT LEFT ELBOW;  Surgeon: Tarry Kos, MD;  Location: [facility] OR;  Service: Orthopedics;  Laterality: Left;   I & D EXTREMITY Left 07/18/2022   Procedure: LEFT ELBOW IRRIGATION AND DEBRIDEMENT EXTREMITY;  Surgeon: Tarry Kos, MD;  Location: [facility];  Service: Orthopedics;  Laterality: Left;   I & D EXTREMITY Left 10/24/2022   Procedure: LEFT ELBOW DEBRIDEMENT;  Surgeon: Nadara Mustard, MD;  Location: [facility] OR;  Service: Orthopedics;  Laterality: Left;   I & D EXTREMITY Left 10/29/2022   Procedure: LEFT ELBOW DEBRIDEMENT;  Surgeon: Nadara Mustard, MD;  Location: [facility] OR;  Service: Orthopedics;  Laterality: Left;   I & D EXTREMITY Left 12/17/2022   Procedure: LEFT ELBOW DEBRIDEMENT;  Surgeon: Nadara Mustard, MD;  Location: [facility] OR;  Service: Orthopedics;  Laterality: Left;   IRRIGATION AND DEBRIDEMENT ELBOW Left 09/17/2022   Procedure: LEFT IRRIGATION AND DEBRIDEMENT ELBOW;  Surgeon: Tarry Kos, MD;  Location: [facility];  Service: Orthopedics;  Laterality: Left;   TONSILLECTOMY     TRICEPS TENDON REPAIR Left 02/07/2022   Procedure: left triceps repair;  Surgeon: Tarry Kos, MD;  Location: [facility] OR;  Service: Orthopedics;  Laterality: Left;    Current Medications: Current Meds  Medication Sig   ACCU-CHEK GUIDE TEST test strip USE 1 STRIP TO CHECK GLUCOSE 4 TIMES DAILY   Accu-Chek Softclix Lancets lancets USE 1  TO CHECK GLUCOSE UP TO 4 TIMES DAILY   albuterol (PROVENTIL) (2.5 MG/3ML) 0.083% nebulizer solution USE 1 VIAL IN NEBULIZER EVERY 4 HOURS AS NEEDED FOR WHEEZING FOR SHORTNESS OF BREATH   albuterol (VENTOLIN HFA) 108 (90 Base) MCG/ACT inhaler INHALE 1 TO 2 PUFFS BY MOUTH EVERY 6 HOURS AS NEEDED FOR WHEEZING AND FOR SHORTNESS OF BREATH    AMBULATORY NON FORMULARY MEDICATION Blood sugar testing strips and lancets for TrueTrack glucometer.  Use to check blood sugar up to two times a day.  Dx: Type 2 Diabetes   AMBULATORY NON FORMULARY MEDICATION Please provide needles for Tresiba pens   amLODipine (NORVASC) 2.5 MG tablet Take 1 tablet (2.5 mg total) by mouth daily.   aspirin 81 MG EC tablet Take 81 mg by mouth daily.   atenolol (TENORMIN) 50 MG tablet Take 1 tablet (50 mg total) by  mouth daily.   Blood Glucose Monitoring Suppl (ACCU-CHEK GUIDE ME) w/Device KIT USE TO CHECK BLOOD SUGAR UP TO 4 TIMES DAILY AS DIRECTED   Calcium Carbonate (CALCARB 600 PO) Take 600 mg by mouth daily.   doxepin (SINEQUAN) 25 MG capsule TAKE 1 TO 2 CAPSULES BY MOUTH AT BEDTIME AS NEEDED   Dulaglutide (TRULICITY) 1.5 MG/0.5ML SOAJ Inject 1.5 mg into the skin once a week.   DULoxetine (CYMBALTA) 30 MG capsule Take 1 capsule (30 mg total) by mouth 2 (two) times daily.   empagliflozin (JARDIANCE) 10 MG TABS tablet Take 1 tablet (10 mg total) by mouth daily before breakfast.   esomeprazole (NEXIUM) 20 MG capsule Take 20 mg by mouth daily.   Flaxseed, Linseed, (FLAX SEED OIL) 1000 MG CAPS Take 1,000 mg by mouth daily.   fluticasone (FLONASE) 50 MCG/ACT nasal spray Place 2 sprays into both nostrils 2 (two) times daily.   Fluticasone-Umeclidin-Vilant (TRELEGY ELLIPTA) 100-62.5-25 MCG/ACT AEPB INHALE 1 PUFF INTO LUNGS ONCE DAILY   glipiZIDE (GLUCOTROL) 10 MG tablet TAKE 1 TABLET BY MOUTH TWICE DAILY BEFORE A MEAL   hydrochlorothiazide (HYDRODIURIL) 25 MG tablet Take 1 tablet by mouth once daily   insulin NPH Human (NOVOLIN N RELION) 100 UNIT/ML injection Inject 10 units subcutaneously in the morning and 15 units at bedtime.   Insulin Syringe-Needle U-100 (INSULIN SYRINGE .3CC/31GX5/16") 31G X 5/16" 0.3 ML MISC Dx DM E11.9. Inject insulin daily at bedtime.   ipratropium-albuterol (DUONEB) 0.5-2.5 (3) MG/3ML SOLN USE 1 AMPULE IN NEBULIZER EVERY 6 HOURS AS NEEDED    lisinopril (ZESTRIL) 20 MG tablet Take 1 tablet (20 mg total) by mouth daily.   MAGNESIUM GLUCONATE PO Take 420 mg by mouth daily.   meloxicam (MOBIC) 15 MG tablet Take 1 tablet by mouth once daily   metFORMIN (GLUCOPHAGE) 1000 MG tablet Take 1 tablet (1,000 mg total) by mouth 2 (two) times daily with a meal.   Multiple Vitamins-Minerals (MULTIVITAMIN WITH MINERALS) tablet Take 1 tablet by mouth daily. Woman's +   nystatin (MYCOSTATIN) 100000 UNIT/ML suspension Take 5 mLs (500,000 Units total) by mouth 4 (four) times daily.   nystatin (MYCOSTATIN/NYSTOP) powder APPLY  POWDER TOPICALLY THREE TIMES DAILY   Omega-3 Fatty Acids (FISH OIL) 1000 MG CAPS Take 1,000 mg by mouth 2 (two) times daily.   OVER THE COUNTER MEDICATION Apply 1 application  topically at bedtime as needed (CBD Cream. Apply to feet).   pioglitazone (ACTOS) 45 MG tablet Take 1 tablet by mouth once daily   Probiotic Product (PROBIOTIC DAILY PO) Take 2 each by mouth at bedtime. Gummies   rosuvastatin (CRESTOR) 40 MG tablet Take 1 tablet (40 mg total) by mouth daily.     Allergies:   Gabapentin, Invokana [canagliflozin], Latex, and Lyrica [pregabalin]   Social History   Socioeconomic History   Marital status: Married    Spouse name: Not on file   Number of children: Not on file   Years of education: Not on file   Highest education level: 12th grade  Occupational History   Not on file  Tobacco Use   Smoking status: Every Day    Current packs/day: 0.25    Average packs/day: 1.9 packs/day for 47.2 years (90.5 ttl pk-yrs)    Types: Cigarettes    Start date: 1978    Passive exposure: Never   Smokeless tobacco: Never   Tobacco comments:    Smokes 2 cigarettes/day  Vaping Use   Vaping status: Never Used  Substance and Sexual  Activity   Alcohol use: Not Currently   Drug use: Not Currently    Types: Marijuana    Comment: 10/23/22- has not used since 2019   Sexual activity: Yes    Birth control/protection:  Post-menopausal  Other Topics Concern   Not on file  Social History Narrative   Not on file   Social Drivers of Health   Financial Resource Strain: Medium Risk (02/18/2024)   Overall Financial Resource Strain (CARDIA)    Difficulty of Paying Living Expenses: Somewhat hard  Food Insecurity: No Food Insecurity (02/18/2024)   Hunger Vital Sign    Worried About Running Out of Food in the Last Year: Never true    Ran Out of Food in the Last Year: Never true  Transportation Needs: No Transportation Needs (02/18/2024)   PRAPARE - Administrator, Civil Service (Medical): No    Lack of Transportation (Non-Medical): No  Physical Activity: Insufficiently Active (02/18/2024)   Exercise Vital Sign    Days of Exercise per Week: 2 days    Minutes of Exercise per Session: 30 min  Stress: No Stress Concern Present (02/18/2024)   [employer] of Occupational Health - Occupational Stress Questionnaire    Feeling of Stress : Only a little  Social Connections: Moderately Isolated (02/18/2024)   Social Connection and Isolation Panel [NHANES]    Frequency of Communication with Friends and Family: More than three times a week    Frequency of Social Gatherings with Friends and Family: Once a week    Attends Religious Services: Never    Database administrator or Organizations: No    Attends Engineer, structural: Not on file    Marital Status: Married     Family History: The patient's family history includes Cancer in her maternal grandmother, maternal uncle, mother, and paternal grandmother; Diabetes in her father, paternal grandfather, paternal grandmother, paternal uncle, and paternal uncle; Heart attack in her father, maternal aunt, and mother; Stroke in her mother. ROS:   Please see the history of present illness.    All 14 point review of systems negative except as described per history of present illness.  EKGs/Labs/Other Studies Reviewed:    The following studies were  reviewed today:   EKG:  EKG Interpretation Date/Time:  Wednesday March 02 2024 08:53:57 EST Ventricular Rate:  83 PR Interval:  166 QRS Duration:  78 QT Interval:  396 QTC Calculation: 465 R Axis:   78  Text Interpretation: Normal sinus rhythm Cannot rule out Anterior infarct , age undetermined When compared with ECG of 19-Jan-2024 15:15, No significant change was found Confirmed by Huntley Dec reddy [phone]) on 03/02/2024 9:20:59 AM    Recent Labs: 01/19/2024: BUN 25; Creatinine, Ser 1.09; Potassium 4.8; Sodium 142  Recent Lipid Panel    Component Value Date/Time   CHOL 162 02/02/2023 0952   TRIG 188 (H) 02/02/2023 0952   HDL 57 02/02/2023 0952   CHOLHDL 2.8 02/02/2023 0952   VLDL 29 08/26/2017 0917   LDLCALC 77 02/02/2023 0952    Physical Exam:    VS:  BP 130/72   Pulse 83   Ht 5\' 5"  (1.651 m)   Wt 283 lb (128.4 kg)   SpO2 93%   BMI 47.09 kg/m     Wt Readings from Last 3 Encounters:  03/02/24 283 lb (128.4 kg)  02/19/24 289 lb 12 oz (131.4 kg)  01/19/24 292 lb 1.3 oz (132.5 kg)     GENERAL:  Well nourished, well  developed in no acute distress NECK: No JVD; No carotid bruits CARDIAC: RRR, S1 and S2 present, no murmurs, no rubs, no gallops CHEST:  Clear to auscultation without rales, wheezing or rhonchi  Extremities: No pitting pedal edema. Pulses bilaterally symmetric with radial 2+ and dorsalis pedis 2+ NEUROLOGIC:  Alert and oriented x 3  Medication Adjustments/Labs and Tests Ordered: Current medicines are reviewed at length with the patient today.  Concerns regarding medicines are outlined above.  Orders Placed This Encounter  Procedures   EKG 12-Lead   No orders of the defined types were placed in this encounter.   Signed, Cecille Amsterdam, MD, MPH, [facility]. 03/02/2024 9:43 AM    [facility]

## 2024-03-02 NOTE — Assessment & Plan Note (Signed)
 Continue aspirin 81 mg once daily Continue rosuvastatin 40 mg once daily.  On antianginal regimen with amlodipine 2.5 mg once daily, atenolol 50 mg once daily. Reviewed the results from her cardiac CT at length.  Further evaluation with cardiac catheterization discussed in the setting of CT coronary angiogram findings.  Shared Decision Making/Informed Consent{ The risks [stroke (1 in 1000), death (1 in 1000), kidney failure [usually temporary] (1 in 500), bleeding (1 in 200), allergic reaction [possibly serious] (1 in 200)], benefits (diagnostic support and management of coronary artery disease) and alternatives of a cardiac catheterization were discussed in detail with her and she is willing to proceed.  Tentatively to schedule at [facility]. She is aware of need to have a chaperone to drive her back home if being discharged the same day but she is aware to anticipate staying overnight in the hospital.

## 2024-03-02 NOTE — Patient Instructions (Addendum)
 Medication Instructions:  Your physician recommends that you continue on your current medications as directed. Please refer to the Current Medication list given to you today.   *If you need a refill on your cardiac medications before your next appointment, please call your pharmacy*   Lab Work: Your physician recommends that you have a BMET and CBC today in the office for your upcoming procedure.  If you have labs (blood work) drawn today and your tests are completely normal, you will receive your results only by: MyChart Message (if you have MyChart) OR A paper copy in the mail If you have any lab test that is abnormal or we need to change your treatment, we will call you to review the results.   Testing/Procedures:  [place] [facility] DEPT [facility] [place] [address] [place], [place] [address] [place] [address] Dept: [phone] Loc: [phone]  Kemba Hoppes  03/02/2024  You are scheduled for a Cardiac Catheterization on Monday, March 10 with Dr. Cristal Deer End.  1. Please arrive at the [facility] (Main Entrance A) at [facility]: [address] [place], [place] [address] at 5:30 AM (This time is 2 hour(s) before your procedure to ensure your preparation).   Free valet parking service is available. You will check in at ADMITTING. The support person will be asked to wait in the waiting room.  It is OK to have someone drop you off and come back when you are ready to be discharged.    Special note: Every effort is made to have your procedure done on time. Please understand that emergencies sometimes delay scheduled procedures.  2. Diet: Do not eat solid foods after midnight.  The patient may have clear liquids until 5am upon the day of the procedure.  3. Labs: You had labs done in the office  4. Medication instructions in preparation for your procedure:   Contrast Allergy: No  Stop taking,  Lisinopril (Zestril or Prinivil) Monday, March 10,, Mobic (Meloxicam) Monday, March 10,, HTCZ (Hydrochlorothiazide) Monday, March 10,, Empagiflozin [place]) Friday, March 7,  Take only 7.5 units of insulin the night before your procedure. Do not take any insulin on the day of the procedure.  Do not take Diabetes Med Glucophage (Metformin) on the day of the procedure and HOLD 48 HOURS AFTER THE PROCEDURE.  On the morning of your procedure, take your Aspirin 81 mg and any morning medicines NOT listed above.  You may use sips of water.  5. Plan to go home the same day, you will only stay overnight if medically necessary. 6. Bring a current list of your medications and current insurance cards. 7. You MUST have a responsible person to drive you home. 8. Someone MUST be with you the first 24 hours after you arrive home or your discharge will be delayed. 9. Please wear clothes that are easy to get on and off and wear slip-on shoes.  Thank you for allowing [place] to care for you!   -- [facility]    Follow-Up: At [facility], you and your health needs are our priority.  As part of our continuing mission to provide you with exceptional heart care, we have created designated Provider Care Teams.  These Care Teams include your primary Cardiologist (physician) and Advanced Practice Providers (APPs -  Physician Assistants and Nurse Practitioners) who all work together to provide you with the care you need, when you  need it.  We recommend signing up for the patient portal called "MyChart".  Sign up information is provided on this After Visit Summary.  MyChart is used to connect with patients for Virtual Visits (Telemedicine).  Patients are able to view lab/test results, encounter notes, upcoming appointments, etc.  Non-urgent messages can be sent to your provider as well.   To learn more about what you can do with MyChart, go to [email or web address].    Your next  appointment:   6 week(s)  The format for your next appointment:   In Person  Provider:   Huntley Dec, MD   Other Instructions  Coronary Angiogram With Stent Coronary angiogram with stent placement is a procedure to widen or open a narrow blood vessel of the heart (coronary artery). Arteries may become blocked by cholesterol buildup (plaques) in the lining of the artery wall. When a coronary artery becomes partially blocked, blood flow to that area decreases. This may lead to chest pain or a heart attack (myocardial infarction). A stent is a small piece of metal that looks like mesh or spring. Stent placement may be done as treatment after a heart attack, or to prevent a heart attack if a blocked artery is found by a coronary angiogram. Let your health care provider know about: Any allergies you have, including allergies to medicines or contrast dye. All medicines you are taking, including vitamins, herbs, eye drops, creams, and over-the-counter medicines. Any problems you or family members have had with anesthetic medicines. Any blood disorders you have. Any surgeries you have had. Any medical conditions you have, including kidney problems or kidney failure. Whether you are pregnant or may be pregnant. Whether you are breastfeeding. What are the risks? Generally, this is a safe procedure. However, serious problems may occur, including: Damage to nearby structures or organs, such as the heart, blood vessels, or kidneys. A return of blockage. Bleeding, infection, or bruising at the insertion site. A collection of blood under the skin (hematoma) at the insertion site. A blood clot in another part of the body. Allergic reaction to medicines or dyes. Bleeding into the abdomen (retroperitoneal bleeding). Stroke (rare). Heart attack (rare). What happens before the procedure? Staying hydrated Follow instructions from your health care provider about hydration, which may  include: Up to 2 hours before the procedure - you may continue to drink clear liquids, such as water, clear fruit juice, black coffee, and plain tea.    Eating and drinking restrictions Follow instructions from your health care provider about eating and drinking, which may include: 8 hours before the procedure - stop eating heavy meals or foods, such as meat, fried foods, or fatty foods. 6 hours before the procedure - stop eating light meals or foods, such as toast or cereal. 2 hours before the procedure - stop drinking clear liquids. Medicines Ask your health care provider about: Changing or stopping your regular medicines. This is especially important if you are taking diabetes medicines or blood thinners. Taking medicines such as aspirin and ibuprofen. These medicines can thin your blood. Do not take these medicines unless your health care provider tells you to take them. Generally, aspirin is recommended before a thin tube, called a catheter, is passed through a blood vessel and inserted into the heart (cardiac catheterization). Taking over-the-counter medicines, vitamins, herbs, and supplements. General instructions Do not use any products that contain nicotine or tobacco for at least 4 weeks before the procedure. These products include cigarettes, e-cigarettes, and chewing tobacco. If you  need help quitting, ask your health care provider. Plan to have someone take you home from the hospital or clinic. If you will be going home right after the procedure, plan to have someone with you for 24 hours. You may have tests and imaging procedures. Ask your health care provider: How your insertion site will be marked. Ask which artery will be used for the procedure. What steps will be taken to help prevent infection. These may include: Removing hair at the insertion site. Washing skin with a germ-killing soap. Taking antibiotic medicine. What happens during the procedure? An IV will be inserted  into one of your veins. Electrodes may be placed on your chest to monitor your heart rate during the procedure. You will be given one or more of the following: A medicine to help you relax (sedative). A medicine to numb the area (local anesthetic) for catheter insertion. A small incision will be made for catheter insertion. The catheter will be inserted into an artery using a guide wire. The location may be in your groin, your wrist, or the fold of your arm (near your elbow). An X-ray procedure (fluoroscopy) will be used to help guide the catheter to the opening of the heart arteries. A dye will be injected into the catheter. X-rays will be taken. The dye helps to show where any narrowing or blockages are located in the arteries. Tell your health care provider if you have chest pain or trouble breathing. A tiny wire will be guided to the blocked spot, and a balloon will be inflated to make the artery wider. The stent will be expanded to crush the plaques into the wall of the vessel. The stent will hold the area open and improve the blood flow. Most stents have a drug coating to reduce the risk of the stent narrowing over time. The artery may be made wider using a drill, laser, or other tools that remove plaques. The catheter will be removed when the blood flow improves. The stent will stay where it was placed, and the lining of the artery will grow over it. A bandage (dressing) will be placed on the insertion site. Pressure will be applied to stop bleeding. The IV will be removed. This procedure may vary among health care providers and hospitals.    What happens after the procedure? Your blood pressure, heart rate, breathing rate, and blood oxygen level will be monitored until you leave the hospital or clinic. If the procedure is done through the leg, you will lie flat in bed for a few hours or for as long as told by your health care provider. You will be instructed not to bend or cross your  legs. The insertion site and the pulse in your foot or wrist will be checked often. You may have more blood tests, X-rays, and a test that records the electrical activity of your heart (electrocardiogram, or ECG). Do not drive for 24 hours if you were given a sedative during your procedure. Summary Coronary angiogram with stent placement is a procedure to widen or open a narrowed coronary artery. This is done to treat heart problems. Before the procedure, let your health care provider know about all the medical conditions and surgeries you have or have had. This is a safe procedure. However, some problems may occur, including damage to nearby structures or organs, bleeding, blood clots, or allergies. Follow your health care provider's instructions about eating, drinking, medicines, and other lifestyle changes, such as quitting tobacco use before the  procedure. This information is not intended to replace advice given to you by your health care provider. Make sure you discuss any questions you have with your health care provider. Document Revised: 07/06/2019 Document Reviewed: 07/06/2019 Elsevier Patient Education  2021 Elsevier Inc.  Aspirin and Your Heart Aspirin is a medicine that prevents the platelets in your blood from sticking together. Platelets are the cells that your blood uses for clotting. Aspirin can be used to help reduce the risk of blood clots, heart attacks, and other heart-related problems. What are the risks? Daily use of aspirin can cause side effects. Some of these include: Bleeding. Bleeding can be minor or serious. An example of minor bleeding is bleeding from a cut, and the bleeding does not stop. An example of more serious bleeding is stomach bleeding or, rarely, bleeding into the brain. Your risk of bleeding increases if you are also taking NSAIDs, such as ibuprofen. Increased bruising. Upset stomach. An allergic reaction. People who have growths inside the nose (nasal  polyps) have an increased risk of developing an aspirin allergy. How to use aspirin to care for your heart Take aspirin only as told by your health care provider. Make sure that you understand how much to take and what form to take. The two forms of aspirin are: Non-enteric-coated.This type of aspirin does not have a coating and is absorbed quickly. This type of aspirin also comes in a chewable form. Enteric-coated. This type of aspirin has a coating that releases the medicine very slowly. Enteric-coated aspirin might cause less stomach upset than non-enteric-coated aspirin. This type of aspirin should not be chewed or crushed. Work with your health care provider to find out whether it is safe and beneficial for you to take aspirin daily. Taking aspirin daily may be helpful if: You have had a heart attack or chest pain, or you are at risk for a heart attack. You have a condition in which certain heart vessels are blocked (coronary artery disease), and you have had a procedure to treat it. Examples are: Open-heart surgery, such as coronary artery bypass surgery (CABG). Coronary angioplasty,which is done to widen a blood vessel of your heart. Having a small mesh tube, or stent, placed in your coronary artery. You have had certain types of stroke or a mini-stroke known as a transient ischemic attack (TIA). You have a narrowing of the arteries that supply the limbs (peripheral artery disease, or PAD). You have long-term (chronic) heart rhythm problems, such as atrial fibrillation, and your health care provider thinks aspirin may help. You have valve disease or have had surgery on a valve. You are considered at increased risk of developing coronary artery disease or PAD.    Follow these instructions at home Medicines Take over-the-counter and prescription medicines only as told by your health care provider. If you are taking blood thinners: Talk with your health care provider before you take any  medicines that contain aspirin or NSAIDs, such as ibuprofen. These medicines increase your risk for dangerous bleeding. Take your medicine exactly as told, at the same time every day. Avoid activities that could cause injury or bruising, and follow instructions about how to prevent falls. Wear a medical alert bracelet or carry a card that lists what medicines you take. General instructions Do not drink alcohol if: Your health care provider tells you not to drink. You are pregnant, may be pregnant, or are planning to become pregnant. If you drink alcohol: Limit how much you use to: 0-1 drink  a day for women. 0-2 drinks a day for men. Be aware of how much alcohol is in your drink. In the [place] one drink equals one 12 oz bottle of beer (355 mL), one 5 oz glass of wine (148 mL), or one 1 oz glass of hard liquor (44 mL). Keep all follow-up visits as told by your health care provider. This is important. Where to find more information The American Heart Association: www.heart.org Contact a health care provider if you have: Unusual bleeding or bruising. Stomach pain or nausea. Ringing in your ears. An allergic reaction that causes hives, itchy skin, or swelling of the lips, tongue, or face. Get help right away if: You notice that your bowel movements are bloody, or dark red or black in color. You vomit or cough up blood. You have blood in your urine. You cough, breathe loudly (wheeze), or feel short of breath. You have chest pain, especially if the pain spreads to your arms, back, neck, or jaw. You have a headache with confusion. You have any symptoms of a stroke. "BE FAST" is an easy way to remember the main warning signs of a stroke: B - Balance. Signs are dizziness, sudden trouble walking, or loss of balance. E - Eyes. Signs are trouble seeing or a sudden change in vision. F - Face. Signs are sudden weakness or numbness of the face, or the face or eyelid drooping on one side. A - Arms.  Signs are weakness or numbness in an arm. This happens suddenly and usually on one side of the body. S - Speech. Signs are sudden trouble speaking, slurred speech, or trouble understanding what people say. T - Time. Time to call emergency services. Write down what time symptoms started. You have other signs of a stroke, such as: A sudden, severe headache with no known cause. Nausea or vomiting. Seizure. These symptoms may represent a serious problem that is an emergency. Do not wait to see if the symptoms will go away. Get medical help right away. Call your local emergency services (911 in the [place] Do not drive yourself to the hospital. Summary Aspirin use can help reduce the risk of blood clots, heart attacks, and other heart-related problems. Daily use of aspirin can cause side effects. Take aspirin only as told by your health care provider. Make sure that you understand how much to take and what form to take. Your health care provider will help you determine whether it is safe and beneficial for you to take aspirin daily. This information is not intended to replace advice given to you by your health care provider. Make sure you discuss any questions you have with your health care provider. Document Revised: 09/19/2019 Document Reviewed: 09/19/2019 Elsevier Patient Education  2021 Elsevier Inc. Nitroglycerin sublingual tablets What is this medicine? NITROGLYCERIN (nye troe GLI ser in) is a type of vasodilator. It relaxes blood vessels, increasing the blood and oxygen supply to your heart. This medicine is used to relieve chest pain caused by angina. It is also used to prevent chest pain before activities like climbing stairs, going outdoors in cold weather, or sexual activity. This medicine may be used for other purposes; ask your health care provider or pharmacist if you have questions. COMMON BRAND NAME(S): Nitroquick, Nitrostat, Nitrotab What should I tell my health care provider before  I take this medicine? They need to know if you have any of these conditions: anemia head injury, recent stroke, or bleeding in the brain liver disease previous heart  attack an unusual or allergic reaction to nitroglycerin, other medicines, foods, dyes, or preservatives pregnant or trying to get pregnant breast-feeding How should I use this medicine? Take this medicine by mouth as needed. Use at the first sign of an angina attack (chest pain or tightness). You can also take this medicine 5 to 10 minutes before an event likely to produce chest pain. Follow the directions exactly as written on the prescription label. Place one tablet under your tongue and let it dissolve. Do not swallow whole. Replace the dose if you accidentally swallow it. It will help if your mouth is not dry. Saliva around the tablet will help it to dissolve more quickly. Do not eat or drink, smoke or chew tobacco while a tablet is dissolving. Sit down when taking this medicine. In an angina attack, you should feel better within 5 minutes after your first dose. You can take a dose every 5 minutes up to a total of 3 doses. If you do not feel better or feel worse after 1 dose, call 9-1-1 at once. Do not take more than 3 doses in 15 minutes. Your health care provider might give you other directions. Follow those directions if he or she does. Do not take your medicine more often than directed. Talk to your health care provider about the use of this medicine in children. Special care may be needed. Overdosage: If you think you have taken too much of this medicine contact a poison control center or emergency room at once. NOTE: This medicine is only for you. Do not share this medicine with others. What if I miss a dose? This does not apply. This medicine is only used as needed. What may interact with this medicine? Do not take this medicine with any of the following medications: certain migraine medicines like ergotamine and  dihydroergotamine (DHE) medicines used to treat erectile dysfunction like sildenafil, tadalafil, and vardenafil riociguat This medicine may also interact with the following medications: alteplase aspirin heparin medicines for high blood pressure medicines for mental depression other medicines used to treat angina phenothiazines like chlorpromazine, mesoridazine, prochlorperazine, thioridazine This list may not describe all possible interactions. Give your health care provider a list of all the medicines, herbs, non-prescription drugs, or dietary supplements you use. Also tell them if you smoke, drink alcohol, or use illegal drugs. Some items may interact with your medicine. What should I watch for while using this medicine? Tell your doctor or health care professional if you feel your medicine is no longer working. Keep this medicine with you at all times. Sit or lie down when you take your medicine to prevent falling if you feel dizzy or faint after using it. Try to remain calm. This will help you to feel better faster. If you feel dizzy, take several deep breaths and lie down with your feet propped up, or bend forward with your head resting between your knees. You may get drowsy or dizzy. Do not drive, use machinery, or do anything that needs mental alertness until you know how this drug affects you. Do not stand or sit up quickly, especially if you are an older patient. This reduces the risk of dizzy or fainting spells. Alcohol can make you more drowsy and dizzy. Avoid alcoholic drinks. Do not treat yourself for coughs, colds, or pain while you are taking this medicine without asking your doctor or health care professional for advice. Some ingredients may increase your blood pressure. What side effects may I notice from receiving  this medicine? Side effects that you should report to your doctor or health care professional as soon as possible: allergic reactions (skin rash, itching or hives;  swelling of the face, lips, or tongue) low blood pressure (dizziness; feeling faint or lightheaded, falls; unusually weak or tired) low red blood cell counts (trouble breathing; feeling faint; lightheaded, falls; unusually weak or tired) Side effects that usually do not require medical attention (report to your doctor or health care professional if they continue or are bothersome): facial flushing (redness) headache nausea, vomiting This list may not describe all possible side effects. Call your doctor for medical advice about side effects. You may report side effects to FDA at 1-800-FDA-1088. Where should I keep my medicine? Keep out of the reach of children. Store at room temperature between 20 and 25 degrees C (68 and 77 degrees F). Store in Retail buyer. Protect from light and moisture. Keep tightly closed. Throw away any unused medicine after the expiration date. NOTE: This sheet is a summary. It may not cover all possible information. If you have questions about this medicine, talk to your doctor, pharmacist, or health care provider.  2021 Elsevier/Gold Standard (2018-09-15 16:46:32)

## 2024-03-02 NOTE — H&P (View-Only) (Signed)
 Cardiology Consultation:    Date:  03/02/2024   ID:  Kathleen Le, DOB 1961/10/22, MRN [id number]  PCP:  Jomarie Longs, PA-C  Cardiologist:  Luretha Murphy, MD   Referring MD: Jomarie Longs, PA-C   No chief complaint on file.    ASSESSMENT AND PLAN:   Kathleen Le 63 year old woman baseline dyspnea related to COPD, with progressive symptoms of shortness of breath noted to have abnormal stress test with nuclear imaging in December 2024 and CT coronary angiogram February 2025 with possible obstructive disease, diabetes mellitus, COPD on home O2, obesity, hyperlipidemia, smokes cigarettes [1 or 2 cigarettes a day], aortic atherosclerosis, small PFO noted on cardiac CT February 2025, CKD stage II/III. Here discussed further evaluation with cardiac catheterization today  Problem List Items Addressed This Visit     CAD (coronary artery disease), abnormal stress test December 2024, abnormal CT coronary angiogram CAD RADS 4 study and calcium score 2152, February 2025 - Primary   Continue aspirin 81 mg once daily Continue rosuvastatin 40 mg once daily.  On antianginal regimen with amlodipine 2.5 mg once daily, atenolol 50 mg once daily. Reviewed the results from her cardiac CT at length.  Further evaluation with cardiac catheterization discussed in the setting of CT coronary angiogram findings.  Shared Decision Making/Informed Consent{ The risks [stroke (1 in 1000), death (1 in 1000), kidney failure [usually temporary] (1 in 500), bleeding (1 in 200), allergic reaction [possibly serious] (1 in 200)], benefits (diagnostic support and management of coronary artery disease) and alternatives of a cardiac catheterization were discussed in detail with her and she is willing to proceed.  Tentatively to schedule at [facility]. She is aware of need to have a chaperone to drive her back home if being discharged the same day but she is aware to anticipate staying overnight in the  hospital.         Return to clinic tentatively in 4 weeks.   History of Present Illness:    Kathleen Le is a 63 y.o. female who is being seen today for follow-up visit. PCP is Breeback, [name] L, PA-C.   Coronary artery disease with abnormal stress test with nuclear imaging December 2024, now noted on CT coronary angiogram February 2025 with possible obstructive disease, diabetes mellitus, COPD on home O2, obesity, hyperlipidemia, smokes cigarettes [1 or 2 cigarettes a day], aortic atherosclerosis, small PFO noted on cardiac CT February 2025, CKD stage II/III  Lives with her husband at home.  He is a Naval architect and works extended hours.  Here for the visit by herself.  Mentions no significant chest pain symptoms but continues to feel shortness of breath with any amount of activity.  Continues to smoke usually 1 cigarette at bedtime.  Recently her diabetes regimen had been updated then has been started on Jardiance and taken off Januvia.  She is also on insulin, metformin, Actos, Trulicity.  Blood pressures are well-controlled on current regimen with amlodipine 2.5 mg, hydrochlorothiazide 25 mg once a day, lisinopril 20 mg once a day, atenolol 50 mg once daily.   CT coronary angiogram from 02-16-2024 noted severely elevated calcium score 2152, CAD RADS 4 disease mid LCx 70 to 99% lesion, moderate stenosis of mid LAD.  CT FFR could not be performed due to poor quality of the study.  Small PFO was reported.  Extracardiac findings not yet resulted.  Pending radiologist over read  Transthoracic echocardiogram November 2024 with LVEF 55 to 60%, normal diastolic parameters, normal  RV size and function, trace MR  EKG in the clinic today shows sinus rhythm, anteroseptal Q waves.  Heart rate 81/min.  QRS duration 78 ms.  Past Medical History:  Diagnosis Date   Abnormal nuclear stress test, Lexiscan 12-10-2023 basal septal wall perfusion defects suggestive of ischemia 01/19/2024   Abnormal  weight gain 06/08/2018   Acute left-sided low back pain without sciatica 12/31/2021   Acute stress reaction 09/12/2018   Anterolisthesis 07/30/2020   Anxiety    Aortic atherosclerosis (HCC) 11/10/2023   Arthritis    Asthma    Bursitis of elbow 05/28/2022   Cancer (HCC)    uterine   Centrilobular emphysema (HCC) 05/09/2022   Cerumen impaction 01/24/2015   Cervical cancer (HCC) 02/05/2014   Pt reported 22 years ago. Last pap normal per pt and 2013.  Atypical squamous cells on pap, 12/2014. Will send for biopsy.       Chronic obstructive pulmonary disease (HCC) 02/26/2021   Chronic respiratory failure with hypoxia (HCC) 01/16/2023   Class 3 severe obesity due to excess calories with serious comorbidity and body mass index (BMI) of 45.0 to 49.9 in adult [facility]) 08/31/2017   Claustrophobia 06/08/2018   Contusion of left arm 12/31/2021   COPD exacerbation (HCC) 09/02/2022   Coronary artery disease due to lipid rich plaque 11/10/2023   Coronary atherosclerosis as evidenced on CT chest imaging from October 2024 11/10/2023   Current smoker 06/08/2018   DDD (degenerative disc disease), lumbar 05/19/2014   L5-S1 anterolisthesis 9mm.      Degenerative disc disease, lumbar    Depression    Diabetes mellitus without complication (HCC)    Type II   Diabetes mellitus, type II, insulin dependent (HCC) 06/08/2018   Dyslipidemia, goal LDL below 70 08/21/2023   Dyspnea    with exertion due to COPD   Elbow wound, left, sequela 05/11/2022   Encounter for smoking cessation counseling 11/15/2021   Essential hypertension, benign 02/03/2014   Fall 12/31/2021   Foot callus 10/03/2014   GAD (generalized anxiety disorder) 10/27/2017   Gastroesophageal reflux disease with esophagitis 01/10/2015   GERD (gastroesophageal reflux disease)    History of alcohol abuse    20-30 years ago, patient was going through divorce but does not currently drink any alcohol   History of kidney stones    passed   History  of marijuana use    "its been a long time since I last smoked because of my COPD"   History of ovarian cancer 1984   History of substance abuse (HCC) 10/27/2017   Hot flashes 06/08/2018   Hyperlipidemia 01/24/2015   Hyperlipidemia associated with type 2 diabetes mellitus (HCC) 11/05/2020   Hypertension    Hypoglycemic reaction 05/20/2023   Hypotension 01/23/2023   Insomnia due to other mental disorder 10/27/2017   IT band syndrome 02/05/2014   Left lower lobe pulmonary nodule 11/11/2023   5mm nodule on CT 10/2023.  Follow up yearly.      Lower leg edema 11/10/2017   Memory changes 02/05/2014   Moderate episode of recurrent major depressive disorder (HCC) 08/21/2023   Nasal congestion 11/20/2023   Neck pain 12/31/2021   Neuropathy 02/05/2014   Non-restorative sleep 11/05/2020   Peripheral neuropathy    Pneumonia    Primary insomnia 06/08/2018   PTSD (post-traumatic stress disorder) 09/26/2014   Pulmonary emphysema (HCC) 07/26/2012   Overview:   ICD-10 cut over      Pulmonary hypertension (HCC) 11/11/2023   Rib injury 09/26/2014  Rib pain on left side 12/04/2023   Seborrheic keratoses 05/20/2023   Septic bursitis of elbow 10/24/2022   Sinus tachycardia 02/05/2014   Snoring 11/05/2020   SOB (shortness of breath) 10/13/2023   Stage 3a chronic kidney disease (HCC) 02/22/2024   Substance abuse (HCC)    patient denies, states she has only ever used marijuana   Swelling of both lower extremities    Tobacco abuse 01/16/2023   Triceps tendon rupture, left, initial encounter 02/07/2022   Word finding difficulty 02/05/2014    Past Surgical History:  Procedure Laterality Date   APPLICATION OF WOUND VAC Left 12/17/2022   Procedure: APPLICATION OF WOUND VAC;  Surgeon: Nadara Mustard, MD;  Location: [facility] OR;  Service: Orthopedics;  Laterality: Left;   CERVICAL CONIZATION W/BX  1985   DILATION AND CURETTAGE OF UTERUS     ECTOPIC PREGNANCY SURGERY     patient stated she had to have  tube removed   HARDWARE REMOVAL Left 09/17/2022   Procedure: LEFT ELBOW HARDWARE REMOVAL;  Surgeon: Tarry Kos, MD;  Location: [facility] ORS;  Service: Orthopedics;  Laterality: Left;   I & D EXTREMITY Left 05/12/2022   Procedure: IRRIGATION AND DEBRIDEMENT LEFT ELBOW;  Surgeon: Tarry Kos, MD;  Location: [facility] OR;  Service: Orthopedics;  Laterality: Left;   I & D EXTREMITY Left 07/18/2022   Procedure: LEFT ELBOW IRRIGATION AND DEBRIDEMENT EXTREMITY;  Surgeon: Tarry Kos, MD;  Location: [facility];  Service: Orthopedics;  Laterality: Left;   I & D EXTREMITY Left 10/24/2022   Procedure: LEFT ELBOW DEBRIDEMENT;  Surgeon: Nadara Mustard, MD;  Location: [facility] OR;  Service: Orthopedics;  Laterality: Left;   I & D EXTREMITY Left 10/29/2022   Procedure: LEFT ELBOW DEBRIDEMENT;  Surgeon: Nadara Mustard, MD;  Location: [facility] OR;  Service: Orthopedics;  Laterality: Left;   I & D EXTREMITY Left 12/17/2022   Procedure: LEFT ELBOW DEBRIDEMENT;  Surgeon: Nadara Mustard, MD;  Location: [facility] OR;  Service: Orthopedics;  Laterality: Left;   IRRIGATION AND DEBRIDEMENT ELBOW Left 09/17/2022   Procedure: LEFT IRRIGATION AND DEBRIDEMENT ELBOW;  Surgeon: Tarry Kos, MD;  Location: [facility];  Service: Orthopedics;  Laterality: Left;   TONSILLECTOMY     TRICEPS TENDON REPAIR Left 02/07/2022   Procedure: left triceps repair;  Surgeon: Tarry Kos, MD;  Location: [facility] OR;  Service: Orthopedics;  Laterality: Left;    Current Medications: Current Meds  Medication Sig   ACCU-CHEK GUIDE TEST test strip USE 1 STRIP TO CHECK GLUCOSE 4 TIMES DAILY   Accu-Chek Softclix Lancets lancets USE 1  TO CHECK GLUCOSE UP TO 4 TIMES DAILY   albuterol (PROVENTIL) (2.5 MG/3ML) 0.083% nebulizer solution USE 1 VIAL IN NEBULIZER EVERY 4 HOURS AS NEEDED FOR WHEEZING FOR SHORTNESS OF BREATH   albuterol (VENTOLIN HFA) 108 (90 Base) MCG/ACT inhaler INHALE 1 TO 2 PUFFS BY MOUTH EVERY 6 HOURS AS NEEDED FOR WHEEZING AND FOR SHORTNESS OF BREATH    AMBULATORY NON FORMULARY MEDICATION Blood sugar testing strips and lancets for TrueTrack glucometer.  Use to check blood sugar up to two times a day.  Dx: Type 2 Diabetes   AMBULATORY NON FORMULARY MEDICATION Please provide needles for Tresiba pens   amLODipine (NORVASC) 2.5 MG tablet Take 1 tablet (2.5 mg total) by mouth daily.   aspirin 81 MG EC tablet Take 81 mg by mouth daily.   atenolol (TENORMIN) 50 MG tablet Take 1 tablet (50 mg total) by  mouth daily.   Blood Glucose Monitoring Suppl (ACCU-CHEK GUIDE ME) w/Device KIT USE TO CHECK BLOOD SUGAR UP TO 4 TIMES DAILY AS DIRECTED   Calcium Carbonate (CALCARB 600 PO) Take 600 mg by mouth daily.   doxepin (SINEQUAN) 25 MG capsule TAKE 1 TO 2 CAPSULES BY MOUTH AT BEDTIME AS NEEDED   Dulaglutide (TRULICITY) 1.5 MG/0.5ML SOAJ Inject 1.5 mg into the skin once a week.   DULoxetine (CYMBALTA) 30 MG capsule Take 1 capsule (30 mg total) by mouth 2 (two) times daily.   empagliflozin (JARDIANCE) 10 MG TABS tablet Take 1 tablet (10 mg total) by mouth daily before breakfast.   esomeprazole (NEXIUM) 20 MG capsule Take 20 mg by mouth daily.   Flaxseed, Linseed, (FLAX SEED OIL) 1000 MG CAPS Take 1,000 mg by mouth daily.   fluticasone (FLONASE) 50 MCG/ACT nasal spray Place 2 sprays into both nostrils 2 (two) times daily.   Fluticasone-Umeclidin-Vilant (TRELEGY ELLIPTA) 100-62.5-25 MCG/ACT AEPB INHALE 1 PUFF INTO LUNGS ONCE DAILY   glipiZIDE (GLUCOTROL) 10 MG tablet TAKE 1 TABLET BY MOUTH TWICE DAILY BEFORE A MEAL   hydrochlorothiazide (HYDRODIURIL) 25 MG tablet Take 1 tablet by mouth once daily   insulin NPH Human (NOVOLIN N RELION) 100 UNIT/ML injection Inject 10 units subcutaneously in the morning and 15 units at bedtime.   Insulin Syringe-Needle U-100 (INSULIN SYRINGE .3CC/31GX5/16") 31G X 5/16" 0.3 ML MISC Dx DM E11.9. Inject insulin daily at bedtime.   ipratropium-albuterol (DUONEB) 0.5-2.5 (3) MG/3ML SOLN USE 1 AMPULE IN NEBULIZER EVERY 6 HOURS AS NEEDED    lisinopril (ZESTRIL) 20 MG tablet Take 1 tablet (20 mg total) by mouth daily.   MAGNESIUM GLUCONATE PO Take 420 mg by mouth daily.   meloxicam (MOBIC) 15 MG tablet Take 1 tablet by mouth once daily   metFORMIN (GLUCOPHAGE) 1000 MG tablet Take 1 tablet (1,000 mg total) by mouth 2 (two) times daily with a meal.   Multiple Vitamins-Minerals (MULTIVITAMIN WITH MINERALS) tablet Take 1 tablet by mouth daily. Woman's +   nystatin (MYCOSTATIN) 100000 UNIT/ML suspension Take 5 mLs (500,000 Units total) by mouth 4 (four) times daily.   nystatin (MYCOSTATIN/NYSTOP) powder APPLY  POWDER TOPICALLY THREE TIMES DAILY   Omega-3 Fatty Acids (FISH OIL) 1000 MG CAPS Take 1,000 mg by mouth 2 (two) times daily.   OVER THE COUNTER MEDICATION Apply 1 application  topically at bedtime as needed (CBD Cream. Apply to feet).   pioglitazone (ACTOS) 45 MG tablet Take 1 tablet by mouth once daily   Probiotic Product (PROBIOTIC DAILY PO) Take 2 each by mouth at bedtime. Gummies   rosuvastatin (CRESTOR) 40 MG tablet Take 1 tablet (40 mg total) by mouth daily.     Allergies:   Gabapentin, Invokana [canagliflozin], Latex, and Lyrica [pregabalin]   Social History   Socioeconomic History   Marital status: Married    Spouse name: Not on file   Number of children: Not on file   Years of education: Not on file   Highest education level: 12th grade  Occupational History   Not on file  Tobacco Use   Smoking status: Every Day    Current packs/day: 0.25    Average packs/day: 1.9 packs/day for 47.2 years (90.5 ttl pk-yrs)    Types: Cigarettes    Start date: 1978    Passive exposure: Never   Smokeless tobacco: Never   Tobacco comments:    Smokes 2 cigarettes/day  Vaping Use   Vaping status: Never Used  Substance and Sexual  Activity   Alcohol use: Not Currently   Drug use: Not Currently    Types: Marijuana    Comment: 10/23/22- has not used since 2019   Sexual activity: Yes    Birth control/protection:  Post-menopausal  Other Topics Concern   Not on file  Social History Narrative   Not on file   Social Drivers of Health   Financial Resource Strain: Medium Risk (02/18/2024)   Overall Financial Resource Strain (CARDIA)    Difficulty of Paying Living Expenses: Somewhat hard  Food Insecurity: No Food Insecurity (02/18/2024)   Hunger Vital Sign    Worried About Running Out of Food in the Last Year: Never true    Ran Out of Food in the Last Year: Never true  Transportation Needs: No Transportation Needs (02/18/2024)   PRAPARE - Administrator, Civil Service (Medical): No    Lack of Transportation (Non-Medical): No  Physical Activity: Insufficiently Active (02/18/2024)   Exercise Vital Sign    Days of Exercise per Week: 2 days    Minutes of Exercise per Session: 30 min  Stress: No Stress Concern Present (02/18/2024)   [employer] of Occupational Health - Occupational Stress Questionnaire    Feeling of Stress : Only a little  Social Connections: Moderately Isolated (02/18/2024)   Social Connection and Isolation Panel [NHANES]    Frequency of Communication with Friends and Family: More than three times a week    Frequency of Social Gatherings with Friends and Family: Once a week    Attends Religious Services: Never    Database administrator or Organizations: No    Attends Engineer, structural: Not on file    Marital Status: Married     Family History: The patient's family history includes Cancer in her maternal grandmother, maternal uncle, mother, and paternal grandmother; Diabetes in her father, paternal grandfather, paternal grandmother, paternal uncle, and paternal uncle; Heart attack in her father, maternal aunt, and mother; Stroke in her mother. ROS:   Please see the history of present illness.    All 14 point review of systems negative except as described per history of present illness.  EKGs/Labs/Other Studies Reviewed:    The following studies were  reviewed today:   EKG:  EKG Interpretation Date/Time:  Wednesday March 02 2024 08:53:57 EST Ventricular Rate:  83 PR Interval:  166 QRS Duration:  78 QT Interval:  396 QTC Calculation: 465 R Axis:   78  Text Interpretation: Normal sinus rhythm Cannot rule out Anterior infarct , age undetermined When compared with ECG of 19-Jan-2024 15:15, No significant change was found Confirmed by Huntley Dec reddy [phone]) on 03/02/2024 9:20:59 AM    Recent Labs: 01/19/2024: BUN 25; Creatinine, Ser 1.09; Potassium 4.8; Sodium 142  Recent Lipid Panel    Component Value Date/Time   CHOL 162 02/02/2023 0952   TRIG 188 (H) 02/02/2023 0952   HDL 57 02/02/2023 0952   CHOLHDL 2.8 02/02/2023 0952   VLDL 29 08/26/2017 0917   LDLCALC 77 02/02/2023 0952    Physical Exam:    VS:  BP 130/72   Pulse 83   Ht 5\' 5"  (1.651 m)   Wt 283 lb (128.4 kg)   SpO2 93%   BMI 47.09 kg/m     Wt Readings from Last 3 Encounters:  03/02/24 283 lb (128.4 kg)  02/19/24 289 lb 12 oz (131.4 kg)  01/19/24 292 lb 1.3 oz (132.5 kg)     GENERAL:  Well nourished, well  developed in no acute distress NECK: No JVD; No carotid bruits CARDIAC: RRR, S1 and S2 present, no murmurs, no rubs, no gallops CHEST:  Clear to auscultation without rales, wheezing or rhonchi  Extremities: No pitting pedal edema. Pulses bilaterally symmetric with radial 2+ and dorsalis pedis 2+ NEUROLOGIC:  Alert and oriented x 3  Medication Adjustments/Labs and Tests Ordered: Current medicines are reviewed at length with the patient today.  Concerns regarding medicines are outlined above.  Orders Placed This Encounter  Procedures   EKG 12-Lead   No orders of the defined types were placed in this encounter.   Signed, Cecille Amsterdam, MD, MPH, [facility]. 03/02/2024 9:43 AM    [facility]

## 2024-03-03 ENCOUNTER — Telehealth: Payer: Self-pay | Admitting: *Deleted

## 2024-03-03 LAB — BASIC METABOLIC PANEL
BUN/Creatinine Ratio: 20 (ref 12–28)
BUN: 22 mg/dL (ref 8–27)
CO2: 25 mmol/L (ref 20–29)
Calcium: 9.3 mg/dL (ref 8.7–10.3)
Chloride: 100 mmol/L (ref 96–106)
Creatinine, Ser: 1.09 mg/dL — ABNORMAL HIGH (ref 0.57–1.00)
Glucose: 162 mg/dL — ABNORMAL HIGH (ref 70–99)
Potassium: 5.1 mmol/L (ref 3.5–5.2)
Sodium: 143 mmol/L (ref 134–144)
eGFR: 58 mL/min/{1.73_m2} — ABNORMAL LOW (ref >59)

## 2024-03-03 LAB — CBC
Hematocrit: 37.1 % (ref 34.0–46.6)
Hemoglobin: 11.7 g/dL (ref 11.1–15.9)
MCH: 26.7 pg (ref 26.6–33.0)
MCHC: 31.5 g/dL (ref 31.5–35.7)
MCV: 85 fL (ref 79–97)
Platelets: 271 10*3/uL (ref 150–450)
RBC: 4.38 x10E6/uL (ref 3.77–5.28)
RDW: 17.5 % — ABNORMAL HIGH (ref 11.7–15.4)
WBC: 5.2 10*3/uL (ref 3.4–10.8)

## 2024-03-03 NOTE — Telephone Encounter (Addendum)
 Cardiac Catheterization scheduled at [facility] for: Monday March 07, 2024 7:30 AM Arrival time [facility] Main Entrance A at: 5:30 AM  Nothing to eat after midnight prior to procedure, clear liquids until 5 AM day of procedure.  Medication instructions: -Hold:  Lisinopril/hydrochlorothiazide/Mobic-day before and day of procedure -per protocol-GFR < 60 (58)  Metformin-day of procedure and 48 hours post procedure  Jardiance/Actos/Glipizide-AM of procedure  Insulin-AM of procedure/1/2 usual Insulin dose HS prior to procedure  Trulicity weekly-ask day of week-hold AM of procedure if Mondays -Other usual morning medications can be taken with sips of water including aspirin 81 mg.  Plan to go home the same day, you will only stay overnight if medically necessary.  You must have responsible adult to drive you home.  Reviewed procedure instructions with patient.  Left message for patient to call back to review procedure instructions.

## 2024-03-04 ENCOUNTER — Other Ambulatory Visit: Payer: Self-pay | Admitting: Physician Assistant

## 2024-03-05 ENCOUNTER — Other Ambulatory Visit: Payer: Self-pay | Admitting: Physician Assistant

## 2024-03-07 ENCOUNTER — Encounter (HOSPITAL_COMMUNITY)
Admission: RE | Disposition: A | Discharge status: Home / Self Care | Payer: Self-pay | Source: Home / Self Care | Attending: Internal Medicine

## 2024-03-07 ENCOUNTER — Ambulatory Visit (HOSPITAL_COMMUNITY)
Admission: RE | Admit: 2024-03-07 | Discharge: 2024-03-07 | Disposition: A | Discharge status: Home / Self Care | Attending: Internal Medicine | Admitting: Internal Medicine

## 2024-03-07 ENCOUNTER — Other Ambulatory Visit: Payer: Self-pay

## 2024-03-07 DIAGNOSIS — Z79899 Other long term (current) drug therapy: Secondary | ICD-10-CM | POA: Insufficient documentation

## 2024-03-07 DIAGNOSIS — E119 Type 2 diabetes mellitus without complications: Secondary | ICD-10-CM

## 2024-03-07 DIAGNOSIS — I129 Hypertensive chronic kidney disease with stage 1 through stage 4 chronic kidney disease, or unspecified chronic kidney disease: Secondary | ICD-10-CM | POA: Diagnosis not present

## 2024-03-07 DIAGNOSIS — Q2112 Patent foramen ovale: Secondary | ICD-10-CM | POA: Insufficient documentation

## 2024-03-07 DIAGNOSIS — Z7982 Long term (current) use of aspirin: Secondary | ICD-10-CM | POA: Diagnosis not present

## 2024-03-07 DIAGNOSIS — I700 Atherosclerosis of aorta: Secondary | ICD-10-CM | POA: Diagnosis not present

## 2024-03-07 DIAGNOSIS — Z7985 Long-term (current) use of injectable non-insulin antidiabetic drugs: Secondary | ICD-10-CM | POA: Diagnosis not present

## 2024-03-07 DIAGNOSIS — Z794 Long term (current) use of insulin: Secondary | ICD-10-CM | POA: Diagnosis not present

## 2024-03-07 DIAGNOSIS — Z9981 Dependence on supplemental oxygen: Secondary | ICD-10-CM | POA: Insufficient documentation

## 2024-03-07 DIAGNOSIS — E785 Hyperlipidemia, unspecified: Secondary | ICD-10-CM | POA: Insufficient documentation

## 2024-03-07 DIAGNOSIS — I2584 Coronary atherosclerosis due to calcified coronary lesion: Secondary | ICD-10-CM | POA: Diagnosis not present

## 2024-03-07 DIAGNOSIS — Z7984 Long term (current) use of oral hypoglycemic drugs: Secondary | ICD-10-CM | POA: Insufficient documentation

## 2024-03-07 DIAGNOSIS — N1831 Chronic kidney disease, stage 3a: Secondary | ICD-10-CM | POA: Insufficient documentation

## 2024-03-07 DIAGNOSIS — F17210 Nicotine dependence, cigarettes, uncomplicated: Secondary | ICD-10-CM | POA: Diagnosis not present

## 2024-03-07 DIAGNOSIS — I2510 Atherosclerotic heart disease of native coronary artery without angina pectoris: Secondary | ICD-10-CM

## 2024-03-07 DIAGNOSIS — E1122 Type 2 diabetes mellitus with diabetic chronic kidney disease: Secondary | ICD-10-CM | POA: Diagnosis not present

## 2024-03-07 DIAGNOSIS — I25119 Atherosclerotic heart disease of native coronary artery with unspecified angina pectoris: Secondary | ICD-10-CM

## 2024-03-07 DIAGNOSIS — J4489 Other specified chronic obstructive pulmonary disease: Secondary | ICD-10-CM | POA: Diagnosis not present

## 2024-03-07 DIAGNOSIS — R9439 Abnormal result of other cardiovascular function study: Secondary | ICD-10-CM | POA: Diagnosis not present

## 2024-03-07 HISTORY — PX: LEFT HEART CATH AND CORONARY ANGIOGRAPHY: CATH118249

## 2024-03-07 LAB — GLUCOSE, CAPILLARY: Glucose-Capillary: 140 mg/dL — ABNORMAL HIGH (ref 70–99)

## 2024-03-07 SURGERY — LEFT HEART CATH AND CORONARY ANGIOGRAPHY
Anesthesia: LOCAL

## 2024-03-07 MED ORDER — HYDRALAZINE HCL 20 MG/ML IJ SOLN: 10.0000 mg | INTRAMUSCULAR | Status: DC | PRN

## 2024-03-07 MED ORDER — IOHEXOL 350 MG/ML SOLN
INTRAVENOUS | Status: DC | PRN
  Administered 2024-03-07: 25 mL

## 2024-03-07 MED ORDER — LIDOCAINE HCL (PF) 1 % IJ SOLN
INTRAMUSCULAR | Status: AC
  Filled 2024-03-07: qty 30

## 2024-03-07 MED ORDER — FENTANYL CITRATE (PF) 100 MCG/2ML IJ SOLN
INTRAMUSCULAR | Status: DC | PRN
  Administered 2024-03-07: 25 ug via INTRAVENOUS

## 2024-03-07 MED ORDER — VERAPAMIL HCL 2.5 MG/ML IV SOLN
INTRAVENOUS | Status: DC | PRN
  Administered 2024-03-07: 10 mL via INTRA_ARTERIAL

## 2024-03-07 MED ORDER — METFORMIN HCL 1000 MG PO TABS: 1000.0000 mg | ORAL_TABLET | Freq: Two times a day (BID) | ORAL | Status: DC

## 2024-03-07 MED ORDER — LABETALOL HCL 5 MG/ML IV SOLN: 10.0000 mg | INTRAVENOUS | Status: DC | PRN

## 2024-03-07 MED ORDER — HEPARIN SODIUM (PORCINE) 1000 UNIT/ML IJ SOLN
INTRAMUSCULAR | Status: DC | PRN
  Administered 2024-03-07: 5000 [IU] via INTRAVENOUS

## 2024-03-07 MED ORDER — SODIUM CHLORIDE 0.9% FLUSH: 3.0000 mL | INTRAVENOUS | Status: DC | PRN

## 2024-03-07 MED ORDER — SODIUM CHLORIDE 0.9 % WEIGHT BASED INFUSION
1.0000 mL/kg/h | INTRAVENOUS | Status: DC
Start: 2024-03-07 — End: 2024-03-07

## 2024-03-07 MED ORDER — MIDAZOLAM HCL 2 MG/2ML IJ SOLN
INTRAMUSCULAR | Status: AC
  Filled 2024-03-07: qty 2

## 2024-03-07 MED ORDER — LIDOCAINE HCL (PF) 1 % IJ SOLN
INTRAMUSCULAR | Status: DC | PRN
  Administered 2024-03-07: 2 mL

## 2024-03-07 MED ORDER — SODIUM CHLORIDE 0.9 % IV SOLN: 250.0000 mL | INTRAVENOUS | Status: DC | PRN

## 2024-03-07 MED ORDER — HEPARIN (PORCINE) IN NACL 2000-0.9 UNIT/L-% IV SOLN
INTRAVENOUS | Status: DC | PRN
  Administered 2024-03-07: 1000 mL

## 2024-03-07 MED ORDER — HEPARIN SODIUM (PORCINE) 1000 UNIT/ML IJ SOLN
INTRAMUSCULAR | Status: AC
Start: 2024-03-07 — End: ?
  Filled 2024-03-07: qty 10

## 2024-03-07 MED ORDER — SODIUM CHLORIDE 0.9 % WEIGHT BASED INFUSION: 3.0000 mL/kg/h | INTRAVENOUS | Status: AC

## 2024-03-07 MED ORDER — ACETAMINOPHEN 325 MG PO TABS: 650.0000 mg | ORAL_TABLET | ORAL | Status: DC | PRN

## 2024-03-07 MED ORDER — SODIUM CHLORIDE 0.9 % IV SOLN: INTRAVENOUS | Status: DC

## 2024-03-07 MED ORDER — VERAPAMIL HCL 2.5 MG/ML IV SOLN
INTRAVENOUS | Status: AC
  Filled 2024-03-07: qty 2

## 2024-03-07 MED ORDER — MIDAZOLAM HCL 2 MG/2ML IJ SOLN
INTRAMUSCULAR | Status: DC | PRN
  Administered 2024-03-07: 1 mg via INTRAVENOUS

## 2024-03-07 MED ORDER — ONDANSETRON HCL 4 MG/2ML IJ SOLN
4.0000 mg | Freq: Four times a day (QID) | INTRAMUSCULAR | Status: DC | PRN
Start: 2024-03-07 — End: 2024-03-07

## 2024-03-07 MED ORDER — ASPIRIN 81 MG PO CHEW: 81.0000 mg | CHEWABLE_TABLET | Freq: Once | ORAL | Status: DC

## 2024-03-07 MED ORDER — FENTANYL CITRATE (PF) 100 MCG/2ML IJ SOLN
INTRAMUSCULAR | Status: AC
  Filled 2024-03-07: qty 2

## 2024-03-07 MED ORDER — SODIUM CHLORIDE 0.9% FLUSH: 3.0000 mL | Freq: Two times a day (BID) | INTRAVENOUS | Status: DC

## 2024-03-07 SURGICAL SUPPLY — 9 items
CATH 5FR JL3.5 JR4 ANG PIG MP (CATHETERS) IMPLANT
DEVICE RAD COMP TR BAND LRG (VASCULAR PRODUCTS) IMPLANT
GLIDESHEATH SLEND SS 6F .021 (SHEATH) IMPLANT
GUIDEWIRE INQWIRE 1.5J.035X260 (WIRE) IMPLANT
INQWIRE 1.5J .035X260CM (WIRE) ×1 IMPLANT
KIT SINGLE USE MANIFOLD (KITS) IMPLANT
KIT SYRINGE INJ CVI SPIKEX1 (MISCELLANEOUS) IMPLANT
PACK CARDIAC CATHETERIZATION (CUSTOM PROCEDURE TRAY) ×1 IMPLANT
SET ATX-X65L (MISCELLANEOUS) IMPLANT

## 2024-03-07 NOTE — Interval H&P Note (Signed)
 History and Physical Interval Note:  03/07/2024 7:12 AM  Kathleen Le  has presented today for surgery, with the diagnosis of shortness of breath and abnormal stress test/coronary CTA.  The various methods of treatment have been discussed with the patient and family. After consideration of risks, benefits and other options for treatment, the patient has consented to  Procedure(s): LEFT HEART CATH AND CORONARY ANGIOGRAPHY (N/A) as a surgical intervention.  The patient's history has been reviewed, patient examined, no change in status, stable for surgery.  I have reviewed the patient's chart and labs.  Questions were answered to the patient's satisfaction.    Cath Lab Visit (complete for each Cath Lab visit)  Clinical Evaluation Leading to the Procedure:   ACS: No.  Non-ACS:    Anginal Classification: CCS III  Anti-ischemic medical therapy: Maximal Therapy (2 or more classes of medications)  Non-Invasive Test Results: Intermediate-risk stress test findings: cardiac mortality 1-3%/year  Prior CABG: No previous CABG  Yoon Barca

## 2024-03-08 ENCOUNTER — Encounter (HOSPITAL_COMMUNITY): Payer: Self-pay | Admitting: Internal Medicine

## 2024-03-23 ENCOUNTER — Other Ambulatory Visit: Payer: Self-pay | Admitting: Physician Assistant

## 2024-03-24 DIAGNOSIS — J9690 Respiratory failure, unspecified, unspecified whether with hypoxia or hypercapnia: Secondary | ICD-10-CM | POA: Diagnosis not present

## 2024-03-28 ENCOUNTER — Other Ambulatory Visit: Payer: Self-pay | Admitting: Physician Assistant

## 2024-03-30 ENCOUNTER — Other Ambulatory Visit: Payer: Self-pay | Admitting: Physician Assistant

## 2024-03-30 DIAGNOSIS — J432 Centrilobular emphysema: Secondary | ICD-10-CM

## 2024-03-30 DIAGNOSIS — E1165 Type 2 diabetes mellitus with hyperglycemia: Secondary | ICD-10-CM

## 2024-04-01 ENCOUNTER — Other Ambulatory Visit: Payer: Self-pay | Admitting: Physician Assistant

## 2024-04-01 DIAGNOSIS — E1165 Type 2 diabetes mellitus with hyperglycemia: Secondary | ICD-10-CM

## 2024-04-07 ENCOUNTER — Encounter (HOSPITAL_COMMUNITY): Payer: Self-pay | Admitting: Internal Medicine

## 2024-04-12 ENCOUNTER — Encounter: Payer: Self-pay | Admitting: Physician Assistant

## 2024-04-12 MED ORDER — ONDANSETRON 8 MG PO TBDP: 8.0000 mg | ORAL_TABLET | Freq: Three times a day (TID) | ORAL | 1 refills | Status: AC | PRN

## 2024-04-18 ENCOUNTER — Encounter (HOSPITAL_COMMUNITY): Payer: Self-pay | Admitting: Internal Medicine

## 2024-04-19 ENCOUNTER — Ambulatory Visit: Payer: Self-pay

## 2024-04-19 VITALS — BP 122/70 | HR 76 | Ht 65.0 in | Wt 270.0 lb

## 2024-04-19 DIAGNOSIS — I700 Atherosclerosis of aorta: Secondary | ICD-10-CM | POA: Diagnosis not present

## 2024-04-19 DIAGNOSIS — Z720 Tobacco use: Secondary | ICD-10-CM | POA: Diagnosis not present

## 2024-04-19 DIAGNOSIS — I10 Essential (primary) hypertension: Secondary | ICD-10-CM | POA: Diagnosis not present

## 2024-04-19 DIAGNOSIS — I2510 Atherosclerotic heart disease of native coronary artery without angina pectoris: Secondary | ICD-10-CM | POA: Diagnosis not present

## 2024-04-19 DIAGNOSIS — E785 Hyperlipidemia, unspecified: Secondary | ICD-10-CM | POA: Diagnosis not present

## 2024-04-19 NOTE — Assessment & Plan Note (Signed)
 Management similar to coronary atherosclerosis as reviewed below

## 2024-04-19 NOTE — Assessment & Plan Note (Signed)
 Continue with rosuvastatin  40 mg once daily. Last lipid panel was available from February 2024 with LDL 77, HDL 57, total cholesterol 295 and triglycerides 188. Continue to follow-up with PCP for an annual lipid panel check.

## 2024-04-19 NOTE — Patient Instructions (Signed)
 Medication Instructions:  Your physician recommends that you continue on your current medications as directed. Please refer to the Current Medication list given to you today.  *If you need a refill on your cardiac medications before your next appointment, please call your pharmacy*   Lab Work: None Ordered If you have labs (blood work) drawn today and your tests are completely normal, you will receive your results only by: MyChart Message (if you have MyChart) OR A paper copy in the mail If you have any lab test that is abnormal or we need to change your treatment, we will call you to review the results.   Testing/Procedures: None Ordered   Follow-Up: At [facility], you and your health needs are our priority.  As part of our continuing mission to provide you with exceptional heart care, we have created designated Provider Care Teams.  These Care Teams include your primary Cardiologist (physician) and Advanced Practice Providers (APPs -  Physician Assistants and Nurse Practitioners) who all work together to provide you with the care you need, when you need it.  We recommend signing up for the patient portal called "MyChart".  Sign up information is provided on this After Visit Summary.  MyChart is used to connect with patients for Virtual Visits (Telemedicine).  Patients are able to view lab/test results, encounter notes, upcoming appointments, etc.  Non-urgent messages can be sent to your provider as well.   To learn more about what you can do with MyChart, go to [email or web address].    Your next appointment: 1 year follow up

## 2024-04-19 NOTE — Progress Notes (Signed)
 Cardiology Consultation:    Date:  04/19/2024   ID:  Kathleen Le, DOB 15-Apr-1961, MRN [id number]  PCP:  Araceli Knight, PA-C  Cardiologist:  Angelena Kells, MD   Referring MD: Araceli Knight, PA-C   No chief complaint on file.    ASSESSMENT AND PLAN:   [name] 63 year old woman history of coronary atherosclerosis.  Workup for dyspnea on exertion and noted abnormal stress test this was followed up with severe obstructive CAD observed on on cardiac CT, this was followed up with cardiac cath 03-07-2024 that fortunately showed only mild to moderate nonobstructive disease, normal biventricular function and no significant valve abnormalities on echocardiogram November 2024, normal LVEDP 12 mmHg on cardiac cath in March 2025, she does have significant COPD history and ongoing smoking history which she is trying to cut down, obesity, diabetes mellitus, hyperlipidemia, aortic atherosclerosis, small PFO, CKD stage II/III, chronic back pain.  Here for follow-up visit after cardiac cath.  In this context her symptoms appear to be more related to underlying pulmonary issues and deconditioning.  Problem List Items Addressed This Visit     Essential hypertension, benign   Well-controlled. Continue with atenolol  50 mg once daily Continue hydrochlorothiazide  25 mg once daily Continue lisinopril  20 mg once daily Target blood pressure below 130/80 mmHg.      Tobacco abuse   Advised strongly to quit smoking given the harmful effects and her underlying COPD.  She is working on this and plans to quit in near future.      Dyslipidemia, goal LDL below 70   Continue with rosuvastatin  40 mg once daily. Last lipid panel was available from February 2024 with LDL 77, HDL 57, total cholesterol 409 and triglycerides 188. Continue to follow-up with PCP for an annual lipid panel check.      Aortic atherosclerosis (HCC)   Management similar to coronary atherosclerosis as reviewed below       CAD (coronary artery disease), abnormal stress test December 2024, abnormal CT coronary angiogram CAD RADS 4 study and calcium  score 2152, February 2025; cardiac cath mild-mod nonobstructive 03-07-2024 - Primary   Continue aspirin  81 mg once daily Continue rosuvastatin  40 mg once daily. Reviewed the findings of coronary atherosclerosis with mild to moderate nonobstructive disease on cardiac cath.  In this context her symptoms of shortness of breath are likely related to her underlying pulmonary issues and deconditioning. Advised her to continue with her weight loss regimen and avoid smoking and continue optimization of her COPD care through her PCP and if further needed through pulmonologist.  She is already seeing benefit with losing weight and cutting down on smoking. Encouraged her to continue doing the same. Will tentatively follow-up with her in the office in 1 year.  Earlier follow-up if needed.       Return to clinic tentatively in 1 year.  Subsequently further follow-ups can be on an as-needed basis.   History of Present Illness:    Kathleen Le is a 63 y.o. female who is being seen today for follow-up visit PCP is Breeback, [name] L, PA-C. Last visit with me in the office was 03-02-2024.  With history of COPD and significant dyspnea related to this, progressive symptoms of shortness of breath and abnormal stress test with nuclear imaging cardiac CT imaging was performed which showed severe coronary atherosclerosis with calcium  score 2152 and CAD RADS 4 study involving mid LCx severe stenosis and moderate stenosis of mid LAD and small PFO was noted.  Further  evaluation with cardiac catheterization coronary angiogram done 03-07-2024 noted mild to moderate nonobstructive coronary artery disease 30% mid LAD stenosis and 20% proximal LCx and 40% proximal RCA stenosis was reported with LVEDP normal 12 mmHg. Echocardiogram from November 2024 noted normal biventricular function with LVEF 55 to  60% and normal diastolic parameters and trace MR.  She also has a history of diabetes mellitus, obesity, hyperlipidemia, smokes cigarettes [1-2 a day], aortic atherosclerosis, CKD stage II/III, chronic back pain.  In this context her symptoms appear to be more related to underlying pulmonary issues and deconditioning.  Mentions overall she is doing well.  Has been losing weight, lost another 13 pounds since her last visit with me in the office.  She feels slightly better with her breathing, she is happy about being able to walk slightly longer distances without getting out of breath.  She mentions continues to smoke intermittently but is working on quitting completely. Good compliance with medications.   Past Medical History:  Diagnosis Date   Abnormal nuclear stress test, Lexiscan  12-10-2023 basal septal wall perfusion defects suggestive of ischemia 01/19/2024   Abnormal weight gain 06/08/2018   Acute left-sided low back pain without sciatica 12/31/2021   Acute stress reaction 09/12/2018   Anterolisthesis 07/30/2020   Anxiety    Aortic atherosclerosis (HCC) 11/10/2023   Arthritis    Asthma    Bursitis of elbow 05/28/2022   CAD (coronary artery disease), abnormal stress test December 2024, abnormal CT coronary angiogram CAD RADS 4 study and calcium  score 2152, February 2025 11/10/2023   Cancer (HCC)    uterine   Centrilobular emphysema (HCC) 05/09/2022   Cerumen impaction 01/24/2015   Cervical cancer (HCC) 02/05/2014   Pt reported 22 years ago. Last pap normal per pt and 2013.  Atypical squamous cells on pap, 12/2014. Will send for biopsy.       Chronic obstructive pulmonary disease (HCC) 02/26/2021   Chronic respiratory failure with hypoxia (HCC) 01/16/2023   Class 3 severe obesity due to excess calories with serious comorbidity and body mass index (BMI) of 45.0 to 49.9 in adult [facility]) 08/31/2017   Claustrophobia 06/08/2018   Contusion of left arm 12/31/2021   COPD exacerbation  (HCC) 09/02/2022   Coronary artery disease due to lipid rich plaque 11/10/2023   Coronary atherosclerosis as evidenced on CT chest imaging from October 2024 11/10/2023   Current smoker 06/08/2018   DDD (degenerative disc disease), lumbar 05/19/2014   L5-S1 anterolisthesis 9mm.      Degenerative disc disease, lumbar    Depression    Diabetes mellitus without complication (HCC)    Type II   Diabetes mellitus, type II, insulin  dependent (HCC) 06/08/2018   Dyslipidemia, goal LDL below 70 08/21/2023   Dyspnea    with exertion due to COPD   Elbow wound, left, sequela 05/11/2022   Encounter for smoking cessation counseling 11/15/2021   Essential hypertension, benign 02/03/2014   Fall 12/31/2021   Foot callus 10/03/2014   GAD (generalized anxiety disorder) 10/27/2017   Gastroesophageal reflux disease with esophagitis 01/10/2015   GERD (gastroesophageal reflux disease)    History of alcohol  abuse    20-30 years ago, patient was going through divorce but does not currently drink any alcohol    History of kidney stones    passed   History of marijuana use    "its been a long time since I last smoked because of my COPD"   History of ovarian cancer 1984   History of substance abuse (HCC)  10/27/2017   Hot flashes 06/08/2018   Hyperlipidemia 01/24/2015   Hyperlipidemia associated with type 2 diabetes mellitus (HCC) 11/05/2020   Hypertension    Hypoglycemic reaction 05/20/2023   Hypotension 01/23/2023   Insomnia due to other mental disorder 10/27/2017   IT band syndrome 02/05/2014   Left lower lobe pulmonary nodule 11/11/2023   5mm nodule on CT 10/2023.  Follow up yearly.      Lower leg edema 11/10/2017   Memory changes 02/05/2014   Moderate episode of recurrent major depressive disorder (HCC) 08/21/2023   Nasal congestion 11/20/2023   Neck pain 12/31/2021   Neuropathy 02/05/2014   Non-restorative sleep 11/05/2020   Peripheral neuropathy    Pneumonia    Primary insomnia 06/08/2018    PTSD (post-traumatic stress disorder) 09/26/2014   Pulmonary emphysema (HCC) 07/26/2012   Overview:   ICD-10 cut over      Pulmonary hypertension (HCC) 11/11/2023   Rib injury 09/26/2014   Rib pain on left side 12/04/2023   Seborrheic keratoses 05/20/2023   Septic bursitis of elbow 10/24/2022   Sinus tachycardia 02/05/2014   Snoring 11/05/2020   SOB (shortness of breath) 10/13/2023   Stage 3a chronic kidney disease (HCC) 02/22/2024   Substance abuse (HCC)    patient denies, states she has only ever used marijuana   Swelling of both lower extremities    Tobacco abuse 01/16/2023   Triceps tendon rupture, left, initial encounter 02/07/2022   Word finding difficulty 02/05/2014    Past Surgical History:  Procedure Laterality Date   APPLICATION OF WOUND VAC Left 12/17/2022   Procedure: APPLICATION OF WOUND VAC;  Surgeon: Timothy Ford, MD;  Location: [facility] OR;  Service: Orthopedics;  Laterality: Left;   CERVICAL CONIZATION W/BX  1985   DILATION AND CURETTAGE OF UTERUS     ECTOPIC PREGNANCY SURGERY     patient stated she had to have tube removed   HARDWARE REMOVAL Left 09/17/2022   Procedure: LEFT ELBOW HARDWARE REMOVAL;  Surgeon: Wes Hamman, MD;  Location: [facility] ORS;  Service: Orthopedics;  Laterality: Left;   I & D EXTREMITY Left 05/12/2022   Procedure: IRRIGATION AND DEBRIDEMENT LEFT ELBOW;  Surgeon: Wes Hamman, MD;  Location: [facility] OR;  Service: Orthopedics;  Laterality: Left;   I & D EXTREMITY Left 07/18/2022   Procedure: LEFT ELBOW IRRIGATION AND DEBRIDEMENT EXTREMITY;  Surgeon: Wes Hamman, MD;  Location: [facility];  Service: Orthopedics;  Laterality: Left;   I & D EXTREMITY Left 10/24/2022   Procedure: LEFT ELBOW DEBRIDEMENT;  Surgeon: Timothy Ford, MD;  Location: [facility] OR;  Service: Orthopedics;  Laterality: Left;   I & D EXTREMITY Left 10/29/2022   Procedure: LEFT ELBOW DEBRIDEMENT;  Surgeon: Timothy Ford, MD;  Location: [facility] OR;  Service: Orthopedics;   Laterality: Left;   I & D EXTREMITY Left 12/17/2022   Procedure: LEFT ELBOW DEBRIDEMENT;  Surgeon: Timothy Ford, MD;  Location: [facility] OR;  Service: Orthopedics;  Laterality: Left;   IRRIGATION AND DEBRIDEMENT ELBOW Left 09/17/2022   Procedure: LEFT IRRIGATION AND DEBRIDEMENT ELBOW;  Surgeon: Wes Hamman, MD;  Location: [facility] OR[facility];  Service: Orthopedics;  Laterality: Left;   LEFT HEART CATH AND CORONARY ANGIOGRAPHY N/A 03/07/2024   Procedure: LEFT HEART CATH AND CORONARY ANGIOGRAPHY;  Surgeon: Sammy Crisp, MD;  Location: [facility] IN[facility];  Service: Cardiovascular;  Laterality: N/A;   TONSILLECTOMY     TRICEPS TENDON REPAIR Left 02/07/2022   Procedure: left triceps repair;  Surgeon: Wes Hamman, MD;  Location: [facility] OR;  Service: Orthopedics;  Laterality: Left;    Current Medications: Current Meds  Medication Sig   ACCU-CHEK GUIDE TEST test strip USE 1 STRIP TO CHECK GLUCOSE 4 TIMES DAILY   Accu-Chek Softclix Lancets lancets USE 1  TO CHECK GLUCOSE UP TO 4 TIMES DAILY   albuterol  (PROVENTIL ) (2.5 MG/3ML) 0.083% nebulizer solution USE 1 VIAL IN NEBULIZER EVERY 4 HOURS AS NEEDED FOR WHEEZING FOR SHORTNESS OF BREATH   albuterol  (VENTOLIN  HFA) 108 (90 Base) MCG/ACT inhaler INHALE 1 TO 2 PUFFS INTO LUNGS EVERY 6 HOURS AS NEEDED FOR WHEEZING AND FOR SHORTNESS OF BREATH   AMBULATORY NON FORMULARY MEDICATION Blood sugar testing strips and lancets for TrueTrack glucometer.  Use to check blood sugar up to two times a day.  Dx: Type 2 Diabetes   AMBULATORY NON FORMULARY MEDICATION Please provide needles for Tresiba  pens   amLODipine  (NORVASC ) 2.5 MG tablet Take 1 tablet (2.5 mg total) by mouth daily.   aspirin  81 MG EC tablet Take 81 mg by mouth daily.   atenolol  (TENORMIN ) 50 MG tablet Take 1 tablet (50 mg total) by mouth daily.   Blood Glucose Monitoring Suppl (ACCU-CHEK GUIDE ME) w/Device KIT USE TO CHECK BLOOD SUGAR UP TO 4 TIMES DAILY AS DIRECTED   Calcium  Carbonate (CALCARB 600 PO) Take 600 mg by  mouth daily.   Carboxymethylcellulose Sodium (THERATEARS EXTRA) 0.25 % SOLN Place 1 drop into both eyes as needed (dry, irritated eyes).   doxepin  (SINEQUAN ) 25 MG capsule TAKE 1 TO 2 CAPSULES BY MOUTH AT BEDTIME AS NEEDED   Dulaglutide  (TRULICITY ) 1.5 MG/0.5ML SOAJ Inject 1.5 mg into the skin once a week.   DULoxetine  (CYMBALTA ) 30 MG capsule Take 1 capsule (30 mg total) by mouth 2 (two) times daily.   empagliflozin  (JARDIANCE ) 10 MG TABS tablet Take 1 tablet (10 mg total) by mouth daily before breakfast.   esomeprazole  (NEXIUM ) 20 MG capsule Take 20 mg by mouth daily.   Flaxseed, Linseed, (FLAX SEED OIL) 1000 MG CAPS Take 1,000 mg by mouth daily.   fluticasone  (FLONASE ) 50 MCG/ACT nasal spray Place 2 sprays into both nostrils 2 (two) times daily.   glipiZIDE  (GLUCOTROL ) 10 MG tablet TAKE 1 TABLET BY MOUTH TWICE DAILY BEFORE A MEAL   hydrochlorothiazide  (HYDRODIURIL ) 25 MG tablet Take 1 tablet by mouth once daily   Insulin  Syringe-Needle U-100 (INSULIN  SYRINGE .3CC/31GX5/16") 31G X 5/16" 0.3 ML MISC Dx DM E11.9. Inject insulin  daily at bedtime.   ipratropium-albuterol  (DUONEB) 0.5-2.5 (3) MG/3ML SOLN USE 1 AMPULE IN NEBULIZER EVERY 6 HOURS AS NEEDED   lisinopril  (ZESTRIL ) 20 MG tablet Take 1 tablet (20 mg total) by mouth daily.   MAGNESIUM  GLUCONATE PO Take 420 mg by mouth daily.   meloxicam  (MOBIC ) 15 MG tablet Take 1 tablet by mouth once daily   metFORMIN  (GLUCOPHAGE ) 1000 MG tablet Take 1 tablet (1,000 mg total) by mouth 2 (two) times daily with a meal.   Multiple Vitamins-Minerals (MULTIVITAMIN WITH MINERALS) tablet Take 1 tablet by mouth daily. Woman's +   NOVOLIN N RELION 100 UNIT/ML injection INJECT 10 UNITS SUBCUTANEOUSLY IN THE MORNING AND 15 AT BEDTIME   nystatin  (MYCOSTATIN /NYSTOP ) powder APPLY  POWDER TOPICALLY THREE TIMES DAILY   Omega-3 Fatty Acids (FISH OIL) 1000 MG CAPS Take 1,000 mg by mouth 2 (two) times daily.   ondansetron  (ZOFRAN -ODT) 8 MG disintegrating tablet Take 1  tablet (8 mg total) by mouth every 8 (eight) hours as needed.  OVER THE COUNTER MEDICATION Apply 1 application  topically at bedtime as needed (CBD Cream. Apply to feet).   OVER THE COUNTER MEDICATION Take 1-2 each by mouth at bedtime. Sumo Gummies THC for sleep   pioglitazone  (ACTOS ) 45 MG tablet Take 1 tablet by mouth once daily   Probiotic Product (PROBIOTIC DAILY PO) Take 2 each by mouth at bedtime. Gummies   TRELEGY ELLIPTA  100-62.5-25 MCG/ACT AEPB INHALE 1 PUFF INTO LUNGS ONCE DAILY     Allergies:   Gabapentin, Invokana  [canagliflozin ], Latex, and Lyrica  [pregabalin ]   Social History   Socioeconomic History   Marital status: Married    Spouse name: Not on file   Number of children: Not on file   Years of education: Not on file   Highest education level: 12th grade  Occupational History   Not on file  Tobacco Use   Smoking status: Every Day    Current packs/day: 0.25    Average packs/day: 1.9 packs/day for 47.3 years (90.6 ttl pk-yrs)    Types: Cigarettes    Start date: 63    Passive exposure: Never   Smokeless tobacco: Never   Tobacco comments:    Smokes 2 cigarettes/day  Vaping Use   Vaping status: Never Used  Substance and Sexual Activity   Alcohol  use: Not Currently   Drug use: Not Currently    Types: Marijuana    Comment: 10/23/22- has not used since 2019   Sexual activity: Yes    Birth control/protection: Post-menopausal  Other Topics Concern   Not on file  Social History Narrative   Not on file   Social Drivers of Health   Financial Resource Strain: Medium Risk (02/18/2024)   Overall Financial Resource Strain (CARDIA)    Difficulty of Paying Living Expenses: Somewhat hard  Food Insecurity: No Food Insecurity (02/18/2024)   Hunger Vital Sign    Worried About Running Out of Food in the Last Year: Never true    Ran Out of Food in the Last Year: Never true  Transportation Needs: No Transportation Needs (02/18/2024)   PRAPARE - Scientist, research (physical sciences) (Medical): No    Lack of Transportation (Non-Medical): No  Physical Activity: Insufficiently Active (02/18/2024)   Exercise Vital Sign    Days of Exercise per Week: 2 days    Minutes of Exercise per Session: 30 min  Stress: No Stress Concern Present (02/18/2024)   [employer] of Occupational Health - Occupational Stress Questionnaire    Feeling of Stress : Only a little  Social Connections: Moderately Isolated (02/18/2024)   Social Connection and Isolation Panel [NHANES]    Frequency of Communication with Friends and Family: More than three times a week    Frequency of Social Gatherings with Friends and Family: Once a week    Attends Religious Services: Never    Database administrator or Organizations: No    Attends Engineer, structural: Not on file    Marital Status: Married     Family History: The patient's family history includes Cancer in her maternal grandmother, maternal uncle, mother, and paternal grandmother; Diabetes in her father, paternal grandfather, paternal grandmother, paternal uncle, and paternal uncle; Heart attack in her father, maternal aunt, and mother; Stroke in her mother. ROS:   Please see the history of present illness.    All 14 point review of systems negative except as described per history of present illness.  EKGs/Labs/Other Studies Reviewed:    The following studies were reviewed  today:   EKG:       Recent Labs: 03/02/2024: BUN 22; Creatinine, Ser 1.09; Hemoglobin 11.7; Platelets 271; Potassium 5.1; Sodium 143  Recent Lipid Panel    Component Value Date/Time   CHOL 162 02/02/2023 0952   TRIG 188 (H) 02/02/2023 0952   HDL 57 02/02/2023 0952   CHOLHDL 2.8 02/02/2023 0952   VLDL 29 08/26/2017 0917   LDLCALC 77 02/02/2023 0952    Physical Exam:    VS:  BP 122/70   Pulse 76   Ht 5\' 5"  (1.651 m)   Wt 270 lb (122.5 kg)   SpO2 97%   BMI 44.93 kg/m     Wt Readings from Last 3 Encounters:  04/19/24 270 lb (122.5 kg)   03/07/24 280 lb (127 kg)  03/02/24 283 lb (128.4 kg)     GENERAL:  Well nourished, well developed in no acute distress CARDIAC: RRR, S1 and S2 present, no murmurs, no rubs, no gallops CHEST:  Clear to auscultation without rales, wheezing or rhonchi  Extremities: No pitting pedal edema. Pulses bilaterally symmetric with radial 2+ and dorsalis pedis 2+.  Right radial cath access site normal without any swelling. Left upper arm she has a scratch from her dog sustained about 4 days ago which is healing.  Denies any tenderness.  No evident local swelling or induration. NEUROLOGIC:  Alert and oriented x 3  Medication Adjustments/Labs and Tests Ordered: Current medicines are reviewed at length with the patient today.  Concerns regarding medicines are outlined above.  No orders of the defined types were placed in this encounter.  No orders of the defined types were placed in this encounter.   Signed, Lura Sallies, MD, MPH, [facility]. 04/19/2024 2:07 PM    [facility]

## 2024-04-19 NOTE — Assessment & Plan Note (Signed)
 Well-controlled. Continue with atenolol  50 mg once daily Continue hydrochlorothiazide  25 mg once daily Continue lisinopril  20 mg once daily Target blood pressure below 130/80 mmHg.

## 2024-04-19 NOTE — Assessment & Plan Note (Signed)
 Continue aspirin  81 mg once daily Continue rosuvastatin  40 mg once daily. Reviewed the findings of coronary atherosclerosis with mild to moderate nonobstructive disease on cardiac cath.  In this context her symptoms of shortness of breath are likely related to her underlying pulmonary issues and deconditioning. Advised her to continue with her weight loss regimen and avoid smoking and continue optimization of her COPD care through her PCP and if further needed through pulmonologist.  She is already seeing benefit with losing weight and cutting down on smoking. Encouraged her to continue doing the same. Will tentatively follow-up with her in the office in 1 year.  Earlier follow-up if needed.

## 2024-04-19 NOTE — Assessment & Plan Note (Signed)
 Advised strongly to quit smoking given the harmful effects and her underlying COPD.  She is working on this and plans to quit in near future.

## 2024-04-20 ENCOUNTER — Ambulatory Visit (INDEPENDENT_AMBULATORY_CARE_PROVIDER_SITE_OTHER): Payer: 59

## 2024-04-20 ENCOUNTER — Other Ambulatory Visit: Payer: Self-pay | Admitting: Physician Assistant

## 2024-04-20 DIAGNOSIS — R0602 Shortness of breath: Secondary | ICD-10-CM

## 2024-04-20 DIAGNOSIS — Z1231 Encounter for screening mammogram for malignant neoplasm of breast: Secondary | ICD-10-CM | POA: Diagnosis not present

## 2024-04-20 DIAGNOSIS — J441 Chronic obstructive pulmonary disease with (acute) exacerbation: Secondary | ICD-10-CM

## 2024-04-20 DIAGNOSIS — J432 Centrilobular emphysema: Secondary | ICD-10-CM

## 2024-04-22 ENCOUNTER — Encounter: Payer: Self-pay | Admitting: Physician Assistant

## 2024-04-22 NOTE — Progress Notes (Signed)
 Normal mammogram. Follow up in 1 year.

## 2024-04-24 ENCOUNTER — Other Ambulatory Visit: Payer: Self-pay | Admitting: Physician Assistant

## 2024-04-24 DIAGNOSIS — J9690 Respiratory failure, unspecified, unspecified whether with hypoxia or hypercapnia: Secondary | ICD-10-CM | POA: Diagnosis not present

## 2024-04-28 ENCOUNTER — Other Ambulatory Visit: Payer: Self-pay | Admitting: Physician Assistant

## 2024-04-28 DIAGNOSIS — E1165 Type 2 diabetes mellitus with hyperglycemia: Secondary | ICD-10-CM

## 2024-04-30 ENCOUNTER — Other Ambulatory Visit: Payer: Self-pay | Admitting: Physician Assistant

## 2024-04-30 DIAGNOSIS — I10 Essential (primary) hypertension: Secondary | ICD-10-CM

## 2024-04-30 DIAGNOSIS — J432 Centrilobular emphysema: Secondary | ICD-10-CM

## 2024-05-02 ENCOUNTER — Telehealth: Payer: Self-pay

## 2024-05-02 ENCOUNTER — Ambulatory Visit: Admitting: Internal Medicine

## 2024-05-02 NOTE — Telephone Encounter (Signed)
 Called pt in regards to pft and to reschedule of if pt had any new or worsening symptoms they wanted to be seen for to see if we need to resched.LMTCB

## 2024-05-06 ENCOUNTER — Other Ambulatory Visit: Payer: Self-pay | Admitting: Physician Assistant

## 2024-05-06 DIAGNOSIS — F40240 Claustrophobia: Secondary | ICD-10-CM

## 2024-05-06 DIAGNOSIS — F5101 Primary insomnia: Secondary | ICD-10-CM

## 2024-05-07 ENCOUNTER — Other Ambulatory Visit: Payer: Self-pay | Admitting: Physician Assistant

## 2024-05-07 DIAGNOSIS — E1165 Type 2 diabetes mellitus with hyperglycemia: Secondary | ICD-10-CM

## 2024-05-08 ENCOUNTER — Other Ambulatory Visit: Payer: Self-pay | Admitting: Physician Assistant

## 2024-05-08 DIAGNOSIS — I10 Essential (primary) hypertension: Secondary | ICD-10-CM

## 2024-05-08 DIAGNOSIS — E119 Type 2 diabetes mellitus without complications: Secondary | ICD-10-CM

## 2024-05-09 ENCOUNTER — Other Ambulatory Visit: Payer: Self-pay | Admitting: Physician Assistant

## 2024-05-09 DIAGNOSIS — F5101 Primary insomnia: Secondary | ICD-10-CM

## 2024-05-09 DIAGNOSIS — F40240 Claustrophobia: Secondary | ICD-10-CM

## 2024-05-09 MED ORDER — DOXEPIN HCL 25 MG PO CAPS: 25.0000 mg | ORAL_CAPSULE | Freq: Every day | ORAL | 0 refills | Status: DC

## 2024-05-10 ENCOUNTER — Other Ambulatory Visit: Payer: Self-pay | Admitting: Physician Assistant

## 2024-05-10 DIAGNOSIS — F5101 Primary insomnia: Secondary | ICD-10-CM

## 2024-05-10 DIAGNOSIS — F40240 Claustrophobia: Secondary | ICD-10-CM

## 2024-05-10 MED ORDER — DOXEPIN HCL 25 MG PO CAPS: 25.0000 mg | ORAL_CAPSULE | Freq: Every day | ORAL | 0 refills | Status: DC

## 2024-05-18 ENCOUNTER — Ambulatory Visit: Payer: 59 | Admitting: Physician Assistant

## 2024-05-24 DIAGNOSIS — J9690 Respiratory failure, unspecified, unspecified whether with hypoxia or hypercapnia: Secondary | ICD-10-CM | POA: Diagnosis not present

## 2024-05-27 ENCOUNTER — Other Ambulatory Visit: Payer: Self-pay | Admitting: Physician Assistant

## 2024-05-27 DIAGNOSIS — J432 Centrilobular emphysema: Secondary | ICD-10-CM

## 2024-05-31 ENCOUNTER — Encounter (HOSPITAL_COMMUNITY): Payer: Self-pay | Admitting: Internal Medicine

## 2024-06-01 ENCOUNTER — Other Ambulatory Visit: Payer: Self-pay | Admitting: Internal Medicine

## 2024-06-01 ENCOUNTER — Other Ambulatory Visit: Payer: Self-pay | Admitting: Physician Assistant

## 2024-06-07 ENCOUNTER — Encounter (HOSPITAL_COMMUNITY): Payer: Self-pay | Admitting: Internal Medicine

## 2024-06-07 ENCOUNTER — Encounter: Payer: Self-pay | Admitting: Physician Assistant

## 2024-06-07 ENCOUNTER — Ambulatory Visit (INDEPENDENT_AMBULATORY_CARE_PROVIDER_SITE_OTHER): Admitting: Physician Assistant

## 2024-06-07 VITALS — BP 107/67 | HR 63 | Ht 65.0 in | Wt 268.0 lb

## 2024-06-07 DIAGNOSIS — E1165 Type 2 diabetes mellitus with hyperglycemia: Secondary | ICD-10-CM | POA: Diagnosis not present

## 2024-06-07 DIAGNOSIS — Z7985 Long-term (current) use of injectable non-insulin antidiabetic drugs: Secondary | ICD-10-CM

## 2024-06-07 DIAGNOSIS — F17200 Nicotine dependence, unspecified, uncomplicated: Secondary | ICD-10-CM | POA: Diagnosis not present

## 2024-06-07 DIAGNOSIS — R300 Dysuria: Secondary | ICD-10-CM | POA: Diagnosis not present

## 2024-06-07 DIAGNOSIS — F40240 Claustrophobia: Secondary | ICD-10-CM | POA: Diagnosis not present

## 2024-06-07 DIAGNOSIS — E66813 Obesity, class 3: Secondary | ICD-10-CM | POA: Diagnosis not present

## 2024-06-07 DIAGNOSIS — B3731 Acute candidiasis of vulva and vagina: Secondary | ICD-10-CM

## 2024-06-07 DIAGNOSIS — J432 Centrilobular emphysema: Secondary | ICD-10-CM | POA: Diagnosis not present

## 2024-06-07 DIAGNOSIS — Z7984 Long term (current) use of oral hypoglycemic drugs: Secondary | ICD-10-CM | POA: Diagnosis not present

## 2024-06-07 DIAGNOSIS — E119 Type 2 diabetes mellitus without complications: Secondary | ICD-10-CM

## 2024-06-07 DIAGNOSIS — I2583 Coronary atherosclerosis due to lipid rich plaque: Secondary | ICD-10-CM | POA: Diagnosis not present

## 2024-06-07 DIAGNOSIS — N1831 Chronic kidney disease, stage 3a: Secondary | ICD-10-CM

## 2024-06-07 DIAGNOSIS — F5101 Primary insomnia: Secondary | ICD-10-CM

## 2024-06-07 DIAGNOSIS — I10 Essential (primary) hypertension: Secondary | ICD-10-CM

## 2024-06-07 DIAGNOSIS — I2510 Atherosclerotic heart disease of native coronary artery without angina pectoris: Secondary | ICD-10-CM | POA: Diagnosis not present

## 2024-06-07 LAB — POCT URINALYSIS DIP (CLINITEK)
Bilirubin, UA: NEGATIVE
Glucose, UA: 500 mg/dL — AB
Ketones, POC UA: NEGATIVE mg/dL
Leukocytes, UA: NEGATIVE
Nitrite, UA: NEGATIVE
POC PROTEIN,UA: NEGATIVE
Spec Grav, UA: 1.02 (ref 1.010–1.025)
Urobilinogen, UA: 0.2 U/dL
pH, UA: 5.5 (ref 5.0–8.0)

## 2024-06-07 LAB — POCT GLYCOSYLATED HEMOGLOBIN (HGB A1C): HbA1c, POC (controlled diabetic range): 7.3 % — AB (ref 0.0–7.0)

## 2024-06-07 LAB — POCT UA - MICROALBUMIN
Albumin/Creatinine Ratio, Urine, POC: <30
Creatinine, POC: 100 mg/dL
Microalbumin Ur, POC: 30 mg/L

## 2024-06-07 MED ORDER — GLIPIZIDE 10 MG PO TABS
10.0000 mg | ORAL_TABLET | Freq: Two times a day (BID) | ORAL | 0 refills | Status: DC
Start: 2024-06-07 — End: 2024-07-04

## 2024-06-07 MED ORDER — PIOGLITAZONE HCL 45 MG PO TABS: 45.0000 mg | ORAL_TABLET | Freq: Every day | ORAL | 0 refills | Status: DC

## 2024-06-07 MED ORDER — TRELEGY ELLIPTA 100-62.5-25 MCG/ACT IN AEPB: INHALATION_SPRAY | RESPIRATORY_TRACT | 5 refills | Status: DC

## 2024-06-07 MED ORDER — TRULICITY 1.5 MG/0.5ML ~~LOC~~ SOAJ: SUBCUTANEOUS | 0 refills | Status: DC

## 2024-06-07 MED ORDER — METFORMIN HCL 1000 MG PO TABS
1000.0000 mg | ORAL_TABLET | Freq: Two times a day (BID) | ORAL | 0 refills | Status: DC
Start: 2024-06-07 — End: 2024-07-04

## 2024-06-07 MED ORDER — DOXEPIN HCL 25 MG PO CAPS: 25.0000 mg | ORAL_CAPSULE | Freq: Every day | ORAL | 0 refills | Status: DC

## 2024-06-07 MED ORDER — FLUCONAZOLE 150 MG PO TABS: ORAL_TABLET | ORAL | 0 refills | Status: DC

## 2024-06-07 NOTE — Progress Notes (Unsigned)
 Pt aware DM Eye exam due; Colonoscopy due.

## 2024-06-07 NOTE — Progress Notes (Unsigned)
 Established Patient Office Visit  Subjective   Patient ID: Kathleen Le, female    DOB: [birth year]  Age: 63 y.o. MRN: [id number]  Chief Complaint  Patient presents with  . Diabetes  . Hypertension    HPI Kathleen Le is a pleasant 63 yo female presenting for a follow up on her Diabetes. Her A1C is 7.3 which is down from last time. She has been trying to eat healthier. She is still having some vomiting on Trulicity  but it is better with the Zofran  and not often.   She has taken three doses of fluconazole  for her yeast infection. Her second dose was 6 days ago and the third dose was 3 days ago. She states that her symptoms have gotten much better. She continues to have intermittent pelvic pressure, which began at the same time as the yeast infection. She has minimal discharge and no itching. She is currently taking an SGLT2. Denies burning with urination, bloody discharge or fever.  She is currently trying to quit smoking. She said she has some good days and some bad days. She is down to less than 1/2 pack a day.   Her    ROS    Objective:      BP 107/67 (BP Location: Left Arm, Patient Position: Sitting, Cuff Size: Large)   Pulse 63   Ht 5\' 5"  (1.651 m)   Wt 121.6 kg   SpO2 97%   BMI 44.60 kg/m  {Vitals History (Optional):23777}  Physical Exam   Results for orders placed or performed in visit on 06/07/24  POCT HgB A1C  Result Value Ref Range   Hemoglobin A1C     HbA1c POC (<> result, manual entry)     HbA1c, POC (prediabetic range)     HbA1c, POC (controlled diabetic range) 7.3 (A) 0.0 - 7.0 %  POCT UA - Microalbumin  Result Value Ref Range   Microalbumin Ur, POC 30 mg/L   Creatinine, POC 100 mg/dL   Albumin/Creatinine Ratio, Urine, POC <30   POCT URINALYSIS DIP (CLINITEK)  Result Value Ref Range   Color, UA yellow yellow   Clarity, UA clear clear   Glucose, UA =500 (A) negative mg/dL   Bilirubin, UA negative negative   Ketones, POC UA negative negative mg/dL    Spec Grav, UA 7.829 1.010 - 1.025   Blood, UA trace-intact (A) negative   pH, UA 5.5 5.0 - 8.0   POC PROTEIN,UA negative negative, trace   Urobilinogen, UA 0.2 0.2 or 1.0 E.U./dL   Nitrite, UA Negative Negative   Leukocytes, UA Negative Negative    {Labs (Optional):23779}  The 10-year ASCVD risk score (Arnett DK, et al., 2019) is: 11.1%    Assessment & Plan:   Problem List Items Addressed This Visit       Respiratory   Centrilobular emphysema (HCC)   Relevant Medications   Fluticasone -Umeclidin-Vilant (TRELEGY ELLIPTA ) 100-62.5-25 MCG/ACT AEPB     Endocrine   Type II diabetes mellitus, uncontrolled   Relevant Medications   glipiZIDE  (GLUCOTROL ) 10 MG tablet   metFORMIN  (GLUCOPHAGE ) 1000 MG tablet   pioglitazone  (ACTOS ) 45 MG tablet   Dulaglutide  (TRULICITY ) 1.5 MG/0.5ML SOAJ   Other Relevant Orders   POCT HgB A1C (Completed)   POCT UA - Microalbumin (Completed)   Diabetes mellitus, type II, insulin  dependent (HCC)   Relevant Medications   glipiZIDE  (GLUCOTROL ) 10 MG tablet   metFORMIN  (GLUCOPHAGE ) 1000 MG tablet   pioglitazone  (ACTOS ) 45 MG tablet   Dulaglutide  (TRULICITY ) 1.5 MG/0.5ML  SOAJ     Other   Claustrophobia   Relevant Medications   doxepin  (SINEQUAN ) 25 MG capsule   Primary insomnia   Relevant Medications   doxepin  (SINEQUAN ) 25 MG capsule   Dysuria - Primary   Relevant Orders   POCT URINALYSIS DIP (CLINITEK) (Completed)    Return in about 3 months (around 09/07/2024), or if symptoms worsen or fail to improve.    Maylene Spear, Student-PA

## 2024-06-08 ENCOUNTER — Encounter: Payer: Self-pay | Admitting: Physician Assistant

## 2024-06-08 DIAGNOSIS — F17200 Nicotine dependence, unspecified, uncomplicated: Secondary | ICD-10-CM | POA: Insufficient documentation

## 2024-06-08 DIAGNOSIS — B3731 Acute candidiasis of vulva and vagina: Secondary | ICD-10-CM | POA: Insufficient documentation

## 2024-06-16 ENCOUNTER — Other Ambulatory Visit: Payer: Self-pay | Admitting: Physician Assistant

## 2024-06-24 DIAGNOSIS — J9690 Respiratory failure, unspecified, unspecified whether with hypoxia or hypercapnia: Secondary | ICD-10-CM | POA: Diagnosis not present

## 2024-07-02 ENCOUNTER — Encounter: Payer: Self-pay | Admitting: Physician Assistant

## 2024-07-02 DIAGNOSIS — F40240 Claustrophobia: Secondary | ICD-10-CM

## 2024-07-02 DIAGNOSIS — G629 Polyneuropathy, unspecified: Secondary | ICD-10-CM

## 2024-07-02 DIAGNOSIS — J432 Centrilobular emphysema: Secondary | ICD-10-CM

## 2024-07-02 DIAGNOSIS — F5101 Primary insomnia: Secondary | ICD-10-CM

## 2024-07-02 DIAGNOSIS — G8929 Other chronic pain: Secondary | ICD-10-CM

## 2024-07-02 DIAGNOSIS — I10 Essential (primary) hypertension: Secondary | ICD-10-CM

## 2024-07-02 DIAGNOSIS — E1165 Type 2 diabetes mellitus with hyperglycemia: Secondary | ICD-10-CM

## 2024-07-02 DIAGNOSIS — M5136 Other intervertebral disc degeneration, lumbar region with discogenic back pain only: Secondary | ICD-10-CM

## 2024-07-04 MED ORDER — DOXEPIN HCL 25 MG PO CAPS: 25.0000 mg | ORAL_CAPSULE | Freq: Every day | ORAL | 0 refills | Status: DC

## 2024-07-04 MED ORDER — ALBUTEROL SULFATE HFA 108 (90 BASE) MCG/ACT IN AERS: 1.0000 | INHALATION_SPRAY | Freq: Four times a day (QID) | RESPIRATORY_TRACT | 0 refills | Status: DC | PRN

## 2024-07-04 MED ORDER — GLIPIZIDE 10 MG PO TABS: 10.0000 mg | ORAL_TABLET | Freq: Two times a day (BID) | ORAL | 0 refills | Status: DC

## 2024-07-04 MED ORDER — METFORMIN HCL 1000 MG PO TABS
1000.0000 mg | ORAL_TABLET | Freq: Two times a day (BID) | ORAL | 0 refills | Status: DC
Start: 2024-07-04 — End: 2024-11-09

## 2024-07-04 MED ORDER — LISINOPRIL 20 MG PO TABS: 20.0000 mg | ORAL_TABLET | Freq: Every day | ORAL | 0 refills | Status: DC

## 2024-07-04 MED ORDER — PIOGLITAZONE HCL 45 MG PO TABS: 45.0000 mg | ORAL_TABLET | Freq: Every day | ORAL | 0 refills | Status: DC

## 2024-07-04 MED ORDER — DULOXETINE HCL 30 MG PO CPEP: 30.0000 mg | ORAL_CAPSULE | Freq: Two times a day (BID) | ORAL | 0 refills | Status: DC

## 2024-07-04 MED ORDER — AMLODIPINE BESYLATE 2.5 MG PO TABS: 2.5000 mg | ORAL_TABLET | Freq: Every day | ORAL | 0 refills | Status: DC

## 2024-07-04 MED ORDER — HYDROCHLOROTHIAZIDE 25 MG PO TABS: 25.0000 mg | ORAL_TABLET | Freq: Every day | ORAL | 0 refills | Status: DC

## 2024-07-04 MED ORDER — EMPAGLIFLOZIN 10 MG PO TABS: 10.0000 mg | ORAL_TABLET | Freq: Every day | ORAL | 0 refills | Status: DC

## 2024-07-04 MED ORDER — TRULICITY 1.5 MG/0.5ML ~~LOC~~ SOAJ: SUBCUTANEOUS | 0 refills | Status: DC

## 2024-07-04 MED ORDER — TRELEGY ELLIPTA 100-62.5-25 MCG/ACT IN AEPB: INHALATION_SPRAY | RESPIRATORY_TRACT | 5 refills | Status: DC

## 2024-07-04 MED ORDER — ATENOLOL 50 MG PO TABS: 50.0000 mg | ORAL_TABLET | Freq: Every day | ORAL | 0 refills | Status: DC

## 2024-07-12 ENCOUNTER — Encounter (HOSPITAL_COMMUNITY): Payer: Self-pay | Admitting: Internal Medicine

## 2024-07-14 ENCOUNTER — Other Ambulatory Visit: Payer: Self-pay | Admitting: Physician Assistant

## 2024-07-18 ENCOUNTER — Other Ambulatory Visit: Payer: Self-pay | Admitting: Physician Assistant

## 2024-07-18 DIAGNOSIS — I10 Essential (primary) hypertension: Secondary | ICD-10-CM

## 2024-07-19 ENCOUNTER — Encounter (HOSPITAL_COMMUNITY): Payer: Self-pay | Admitting: Internal Medicine

## 2024-07-24 ENCOUNTER — Other Ambulatory Visit: Payer: Self-pay | Admitting: Physician Assistant

## 2024-07-24 DIAGNOSIS — J9690 Respiratory failure, unspecified, unspecified whether with hypoxia or hypercapnia: Secondary | ICD-10-CM | POA: Diagnosis not present

## 2024-08-01 ENCOUNTER — Encounter (HOSPITAL_COMMUNITY): Payer: Self-pay | Admitting: Internal Medicine

## 2024-08-03 ENCOUNTER — Ambulatory Visit (INDEPENDENT_AMBULATORY_CARE_PROVIDER_SITE_OTHER): Admitting: Internal Medicine

## 2024-08-03 ENCOUNTER — Encounter: Payer: Self-pay | Admitting: Internal Medicine

## 2024-08-03 VITALS — BP 110/62 | HR 73 | Temp 98.2°F | Ht 65.0 in | Wt 253.0 lb

## 2024-08-03 DIAGNOSIS — J4489 Other specified chronic obstructive pulmonary disease: Secondary | ICD-10-CM | POA: Diagnosis not present

## 2024-08-03 DIAGNOSIS — J439 Emphysema, unspecified: Secondary | ICD-10-CM

## 2024-08-03 DIAGNOSIS — Z122 Encounter for screening for malignant neoplasm of respiratory organs: Secondary | ICD-10-CM

## 2024-08-03 DIAGNOSIS — F17200 Nicotine dependence, unspecified, uncomplicated: Secondary | ICD-10-CM

## 2024-08-03 DIAGNOSIS — F17210 Nicotine dependence, cigarettes, uncomplicated: Secondary | ICD-10-CM

## 2024-08-03 DIAGNOSIS — J432 Centrilobular emphysema: Secondary | ICD-10-CM

## 2024-08-03 LAB — PULMONARY FUNCTION TEST
DL/VA % pred: 88 %
DL/VA: 3.7 ml/min/mmHg/L
DLCO cor % pred: 82 %
DLCO cor: 17.15 ml/min/mmHg
DLCO unc % pred: 82 %
DLCO unc: 17.15 ml/min/mmHg
FEF 25-75 Post: 0.95 L/s
FEF 25-75 Pre: 0.85 L/s
FEF2575-%Change-Post: 11 %
FEF2575-%Pred-Post: 40 %
FEF2575-%Pred-Pre: 35 %
FEV1-%Change-Post: 0 %
FEV1-%Pred-Post: 65 %
FEV1-%Pred-Pre: 66 %
FEV1-Post: 1.73 L
FEV1-Pre: 1.73 L
FEV1FVC-%Change-Post: 0 %
FEV1FVC-%Pred-Pre: 85 %
FEV6-%Change-Post: 1 %
FEV6-%Pred-Post: 78 %
FEV6-%Pred-Pre: 77 %
FEV6-Post: 2.57 L
FEV6-Pre: 2.53 L
FEV6FVC-%Change-Post: 2 %
FEV6FVC-%Pred-Post: 103 %
FEV6FVC-%Pred-Pre: 100 %
FVC-%Change-Post: 0 %
FVC-%Pred-Post: 75 %
FVC-%Pred-Pre: 76 %
FVC-Post: 2.58 L
FVC-Pre: 2.6 L
Post FEV1/FVC ratio: 67 %
Post FEV6/FVC ratio: 99 %
Pre FEV1/FVC ratio: 67 %
Pre FEV6/FVC Ratio: 97 %
RV % pred: 181 %
RV: 3.73 L
TLC % pred: 119 %
TLC: 6.22 L

## 2024-08-03 MED ORDER — TRELEGY ELLIPTA 100-62.5-25 MCG/ACT IN AEPB: INHALATION_SPRAY | RESPIRATORY_TRACT | 11 refills | Status: AC

## 2024-08-03 NOTE — Progress Notes (Addendum)
 Kathleen Le    [id number]    Feb 15, 1961  Primary Care Physician:Breeback, Vermell CROME, PA-C Date of Appointment: 08/03/2024 Established Patient Visit  Chief complaint:   Chief Complaint  Patient presents with   COPD    Breathing is better since she lost some weight.     HPI: Kathleen Le is a 63 y.o. woman with COPD.   Interval Updates: Here for follow up after PFTs.  She reports her breathing is a little better after some recent weight loss efforts. Has lost 50 lbs with dietary changes. She thinks moving to once weekly insulin  is helping.    She is still smoking, desire to quit. She is down to 3-5 cigarettes a day.  Uses albuterol  about twice a day.  Uses trelegy once in the morning.  She quit for 6 months about ten years ago. She quit cold [place].   I have reviewed the patient's family social and past medical history and updated as appropriate.   Past Medical History:  Diagnosis Date   Abnormal nuclear stress test, Lexiscan  12-10-2023 basal septal wall perfusion defects suggestive of ischemia 01/19/2024   Abnormal weight gain 06/08/2018   Acute left-sided low back pain without sciatica 12/31/2021   Acute stress reaction 09/12/2018   Anterolisthesis 07/30/2020   Anxiety    Aortic atherosclerosis (HCC) 11/10/2023   Arthritis    Asthma    Bursitis of elbow 05/28/2022   CAD (coronary artery disease), abnormal stress test December 2024, abnormal CT coronary angiogram CAD RADS 4 study and calcium  score 2152, February 2025 11/10/2023   Cancer (HCC)    uterine   Centrilobular emphysema (HCC) 05/09/2022   Cerumen impaction 01/24/2015   Cervical cancer (HCC) 02/05/2014   Pt reported 22 years ago. Last pap normal per pt and 2013.  Atypical squamous cells on pap, 12/2014. Will send for biopsy.       Chronic obstructive pulmonary disease (HCC) 02/26/2021   Chronic respiratory failure with hypoxia (HCC) 01/16/2023   Class 3 severe obesity due to excess calories with  serious comorbidity and body mass index (BMI) of 45.0 to 49.9 in adult 08/31/2017   Claustrophobia 06/08/2018   Contusion of left arm 12/31/2021   COPD exacerbation (HCC) 09/02/2022   Coronary artery disease due to lipid rich plaque 11/10/2023   Coronary atherosclerosis as evidenced on CT chest imaging from October 2024 11/10/2023   Current smoker 06/08/2018   DDD (degenerative disc disease), lumbar 05/19/2014   L5-S1 anterolisthesis 9mm.      Degenerative disc disease, lumbar    Depression    Diabetes mellitus without complication (HCC)    Type II   Diabetes mellitus, type II, insulin  dependent (HCC) 06/08/2018   Dyslipidemia, goal LDL below 70 08/21/2023   Dyspnea    with exertion due to COPD   Elbow wound, left, sequela 05/11/2022   Encounter for smoking cessation counseling 11/15/2021   Essential hypertension, benign 02/03/2014   Fall 12/31/2021   Foot callus 10/03/2014   GAD (generalized anxiety disorder) 10/27/2017   Gastroesophageal reflux disease with esophagitis 01/10/2015   GERD (gastroesophageal reflux disease)    History of alcohol  abuse    20-30 years ago, patient was going through divorce but does not currently drink any alcohol    History of kidney stones    passed   History of marijuana use    its been a long time since I last smoked because of my COPD   History of ovarian  cancer 1984   History of substance abuse (HCC) 10/27/2017   Hot flashes 06/08/2018   Hyperlipidemia 01/24/2015   Hyperlipidemia associated with type 2 diabetes mellitus (HCC) 11/05/2020   Hypertension    Hypoglycemic reaction 05/20/2023   Hypotension 01/23/2023   Insomnia due to other mental disorder 10/27/2017   IT band syndrome 02/05/2014   Left lower lobe pulmonary nodule 11/11/2023   5mm nodule on CT 10/2023.  Follow up yearly.      Lower leg edema 11/10/2017   Memory changes 02/05/2014   Moderate episode of recurrent major depressive disorder (HCC) 08/21/2023   Nasal congestion  11/20/2023   Neck pain 12/31/2021   Neuropathy 02/05/2014   Non-restorative sleep 11/05/2020   Peripheral neuropathy    Pneumonia    Primary insomnia 06/08/2018   PTSD (post-traumatic stress disorder) 09/26/2014   Pulmonary emphysema (HCC) 07/26/2012   Overview:   ICD-10 cut over      Pulmonary hypertension (HCC) 11/11/2023   Rib injury 09/26/2014   Rib pain on left side 12/04/2023   Seborrheic keratoses 05/20/2023   Septic bursitis of elbow 10/24/2022   Sinus tachycardia 02/05/2014   Snoring 11/05/2020   SOB (shortness of breath) 10/13/2023   Stage 3a chronic kidney disease (HCC) 02/22/2024   Substance abuse (HCC)    patient denies, states she has only ever used marijuana   Swelling of both lower extremities    Tobacco abuse 01/16/2023   Triceps tendon rupture, left, initial encounter 02/07/2022   Word finding difficulty 02/05/2014    Past Surgical History:  Procedure Laterality Date   APPLICATION OF WOUND VAC Left 12/17/2022   Procedure: APPLICATION OF WOUND VAC;  Surgeon: Harden Jerona GAILS, MD;  Location: [facility] OR;  Service: Orthopedics;  Laterality: Left;   CERVICAL CONIZATION W/BX  1985   DILATION AND CURETTAGE OF UTERUS     ECTOPIC PREGNANCY SURGERY     patient stated she had to have tube removed   HARDWARE REMOVAL Left 09/17/2022   Procedure: LEFT ELBOW HARDWARE REMOVAL;  Surgeon: Jerri Kay HERO, MD;  Location: [facility] ORS;  Service: Orthopedics;  Laterality: Left;   I & D EXTREMITY Left 05/12/2022   Procedure: IRRIGATION AND DEBRIDEMENT LEFT ELBOW;  Surgeon: Jerri Kay HERO, MD;  Location: [facility] OR;  Service: Orthopedics;  Laterality: Left;   I & D EXTREMITY Left 07/18/2022   Procedure: LEFT ELBOW IRRIGATION AND DEBRIDEMENT EXTREMITY;  Surgeon: Jerri Kay HERO, MD;  Location: [facility];  Service: Orthopedics;  Laterality: Left;   I & D EXTREMITY Left 10/24/2022   Procedure: LEFT ELBOW DEBRIDEMENT;  Surgeon: Harden Jerona GAILS, MD;  Location: [facility] OR;  Service: Orthopedics;   Laterality: Left;   I & D EXTREMITY Left 10/29/2022   Procedure: LEFT ELBOW DEBRIDEMENT;  Surgeon: Harden Jerona GAILS, MD;  Location: [facility] OR;  Service: Orthopedics;  Laterality: Left;   I & D EXTREMITY Left 12/17/2022   Procedure: LEFT ELBOW DEBRIDEMENT;  Surgeon: Harden Jerona GAILS, MD;  Location: [facility] OR;  Service: Orthopedics;  Laterality: Left;   IRRIGATION AND DEBRIDEMENT ELBOW Left 09/17/2022   Procedure: LEFT IRRIGATION AND DEBRIDEMENT ELBOW;  Surgeon: Jerri Kay HERO, MD;  Location: [facility] OR[facility];  Service: Orthopedics;  Laterality: Left;   LEFT HEART CATH AND CORONARY ANGIOGRAPHY N/A 03/07/2024   Procedure: LEFT HEART CATH AND CORONARY ANGIOGRAPHY;  Surgeon: Mady Bruckner, MD;  Location: [facility];  Service: Cardiovascular;  Laterality: N/A;   TONSILLECTOMY     TRICEPS TENDON REPAIR  Left 02/07/2022   Procedure: left triceps repair;  Surgeon: Jerri Kay HERO, MD;  Location: [facility] OR;  Service: Orthopedics;  Laterality: Left;    Family History  Problem Relation Age of Onset   Cancer Paternal Grandmother    Diabetes Paternal Grandmother    Diabetes Paternal Grandfather    Heart attack Maternal Aunt    Cancer Maternal Uncle    Cancer Maternal Grandmother    Heart attack Mother    Cancer Mother    Stroke Mother    Diabetes Father    Heart attack Father    Diabetes Paternal Uncle    Diabetes Paternal Uncle     Social History   Occupational History   Not on file  Tobacco Use   Smoking status: Every Day    Current packs/day: 0.25    Average packs/day: 1.9 packs/day for 47.6 years (90.6 ttl pk-yrs)    Types: Cigarettes    Start date: 1978    Passive exposure: Never   Smokeless tobacco: Never   Tobacco comments:    Smokes 5 cigarettes daily   Vaping Use   Vaping status: Never Used  Substance and Sexual Activity   Alcohol  use: Not Currently   Drug use: Not Currently    Types: Marijuana    Comment: 10/23/22- has not used since 2019   Sexual activity: Yes    Birth control/protection:  Post-menopausal     Physical Exam: Blood pressure 110/62, pulse 73, temperature 98.2 F (36.8 C), temperature source Oral, height 5' 5 (1.651 m), weight 253 lb (114.8 kg), SpO2 97%.  Gen:      No acute distress, obese ENT:  no nasal polyps, mucus membranes moist Lungs:    No increased respiratory effort, symmetric chest wall excursion, clear to auscultation bilaterally, no wheezes or crackles CV:         Regular rate and rhythm; no murmurs, rubs, or gallops.  No pedal edema   Data Reviewed: Imaging: I have personally reviewed the ct chest 2024 shows 5mm LLL nodule, coronary artery calcifications  PFTs:     Latest Ref Rng & Units 08/03/2024   12:42 PM  PFT Results  FVC-Pre L 2.60  P  FVC-Predicted Pre % 76  P  FVC-Post L 2.58  P  FVC-Predicted Post % 75  P  Pre FEV1/FVC % % 67  P  Post FEV1/FCV % % 67  P  FEV1-Pre L 1.73  P  FEV1-Predicted Pre % 66  P  FEV1-Post L 1.73  P  DLCO uncorrected ml/min/mmHg 17.15  P  DLCO UNC% % 82  P  DLCO corrected ml/min/mmHg 17.15  P  DLCO COR %Predicted % 82  P  DLVA Predicted % 88  P  TLC L 6.22  P  TLC % Predicted % 119  P  RV % Predicted % 181  P    P Preliminary result   I have personally reviewed the patient's PFTs and moderate COPD  Labs: Lab Results  Component Value Date   NA 143 03/02/2024   K 5.1 03/02/2024   CO2 25 03/02/2024   GLUCOSE 162 (H) 03/02/2024   BUN 22 03/02/2024   CREATININE 1.09 (H) 03/02/2024   CALCIUM  9.3 03/02/2024   EGFR 58 (L) 03/02/2024   GFRNONAA >60 12/17/2022   Lab Results  Component Value Date   WBC 5.2 03/02/2024   HGB 11.7 03/02/2024   HCT 37.1 03/02/2024   MCV 85 03/02/2024   PLT 271 03/02/2024  Immunization status: Immunization History  Administered Date(s) Administered   Pneumococcal Polysaccharide-23 02/03/2014   Tdap 02/03/2014    External Records Personally Reviewed: cardiology  Assessment:  Moderate COPD Ongoing tobacco use disorder Obesity Body mass index is 42.1  kg/m. Lung cancer screening  Plan/Recommendations:  Continue trelegy 1 puff once daily, gargle after use Continue albuterol  inhaler as needed Keep up the great work with your weight loss! Let me know if you want to try chantix or wellbutrin  for smoking cessation.  Lung cancer screening referral D/c duoneb. Should not be on duoneb with trelegy. Ok to keep using albuterol  nebs and inhaler.   Smoking Cessation Counseling:  1. The patient is an everyday smoker and symptomatic due to the following condition copd 2. The patient is currently contemplative in quitting smoking. 3. I advised patient to quit smoking. 4. We identified patient specific barriers to change.  5. I personally spent 3 minutes counseling the patient regarding tobacco use disorder. 6. We discussed management of stress and anxiety to help with smoking cessation, when applicable. 7. We discussed nicotine  replacement therapy, Wellbutrin , Chantix as possible options. 8. I advised setting a quit date. 9. Follow?up arranged with our office to continue ongoing discussions. 10.Resources given to patient including quit hotline.    Return to Care: Return in about 1 year (around 08/03/2025).   Verdon Gore, MD Pulmonary and Critical Care Medicine [facility] Office:[phone]

## 2024-08-03 NOTE — Progress Notes (Signed)
 Full PFT performed today.

## 2024-08-03 NOTE — Addendum Note (Signed)
 Addended by: Christophor Eick on: 08/03/2024 03:09 PM   Modules accepted: Orders

## 2024-08-03 NOTE — Patient Instructions (Addendum)
 It was a pleasure to see you today!  Please schedule follow up with myself in 1year.  If my schedule is not open yet, we will contact you with a reminder closer to that time. Please call [phone] if you haven't heard from us  a month before, and always call us  sooner if issues or concerns arise. You can also send us  a message through MyChart, but but aware that this is not to be used for urgent issues and it may take up to 5-7 days to receive a reply. Please be aware that you will likely be able to view your results before I have a chance to respond to them. Please give us  5 business days to respond to any non-urgent results.   Breathing testing is consistent with moderate COPD.  Continue trelegy 1 puff once daily, gargle after use Continue albuterol  inhaler as needed Keep up the great work with your weight loss! Let me know if you want to try chantix or wellbutrin  for smoking cessation.   Because of your smoking history I am referring you to our lung cancer screening program. You don't have lung cancer, but your smoking increases your risk. This is a program to help find it early and improve chances for treatment. They will call you to schedule.

## 2024-08-03 NOTE — Patient Instructions (Signed)
 Full PFT performed today.

## 2024-08-04 ENCOUNTER — Other Ambulatory Visit: Payer: Self-pay | Admitting: Physician Assistant

## 2024-08-08 ENCOUNTER — Encounter: Payer: Self-pay | Admitting: Physician Assistant

## 2024-08-08 NOTE — Telephone Encounter (Signed)
 Notes on last OV note with Kathleen Le  Continue on same medications: metformin , glipizide , trulicity , jardiance  and actos  She cannot tolerate increase due to GI side effects of trulicity 

## 2024-08-10 ENCOUNTER — Other Ambulatory Visit: Payer: Self-pay | Admitting: Physician Assistant

## 2024-08-10 DIAGNOSIS — I10 Essential (primary) hypertension: Secondary | ICD-10-CM

## 2024-08-18 ENCOUNTER — Other Ambulatory Visit: Payer: Self-pay | Admitting: Physician Assistant

## 2024-08-24 DIAGNOSIS — J9690 Respiratory failure, unspecified, unspecified whether with hypoxia or hypercapnia: Secondary | ICD-10-CM | POA: Diagnosis not present

## 2024-08-31 ENCOUNTER — Encounter (HOSPITAL_COMMUNITY): Payer: Self-pay | Admitting: Internal Medicine

## 2024-09-07 ENCOUNTER — Ambulatory Visit (INDEPENDENT_AMBULATORY_CARE_PROVIDER_SITE_OTHER): Admitting: Physician Assistant

## 2024-09-07 ENCOUNTER — Encounter (HOSPITAL_COMMUNITY): Payer: Self-pay | Admitting: Internal Medicine

## 2024-09-07 VITALS — BP 122/50 | HR 74 | Ht 65.0 in | Wt 251.0 lb

## 2024-09-07 DIAGNOSIS — J3489 Other specified disorders of nose and nasal sinuses: Secondary | ICD-10-CM | POA: Diagnosis not present

## 2024-09-07 DIAGNOSIS — M5136 Other intervertebral disc degeneration, lumbar region with discogenic back pain only: Secondary | ICD-10-CM

## 2024-09-07 DIAGNOSIS — Z7984 Long term (current) use of oral hypoglycemic drugs: Secondary | ICD-10-CM

## 2024-09-07 DIAGNOSIS — J432 Centrilobular emphysema: Secondary | ICD-10-CM

## 2024-09-07 DIAGNOSIS — F5101 Primary insomnia: Secondary | ICD-10-CM

## 2024-09-07 DIAGNOSIS — E1142 Type 2 diabetes mellitus with diabetic polyneuropathy: Secondary | ICD-10-CM | POA: Diagnosis not present

## 2024-09-07 DIAGNOSIS — J441 Chronic obstructive pulmonary disease with (acute) exacerbation: Secondary | ICD-10-CM | POA: Diagnosis not present

## 2024-09-07 DIAGNOSIS — G8929 Other chronic pain: Secondary | ICD-10-CM | POA: Diagnosis not present

## 2024-09-07 DIAGNOSIS — I10 Essential (primary) hypertension: Secondary | ICD-10-CM

## 2024-09-07 DIAGNOSIS — M5441 Lumbago with sciatica, right side: Secondary | ICD-10-CM | POA: Diagnosis not present

## 2024-09-07 DIAGNOSIS — Z6841 Body Mass Index (BMI) 40.0 and over, adult: Secondary | ICD-10-CM

## 2024-09-07 DIAGNOSIS — E1165 Type 2 diabetes mellitus with hyperglycemia: Secondary | ICD-10-CM

## 2024-09-07 DIAGNOSIS — I700 Atherosclerosis of aorta: Secondary | ICD-10-CM

## 2024-09-07 DIAGNOSIS — F17200 Nicotine dependence, unspecified, uncomplicated: Secondary | ICD-10-CM

## 2024-09-07 DIAGNOSIS — N1831 Chronic kidney disease, stage 3a: Secondary | ICD-10-CM

## 2024-09-07 LAB — POCT GLYCOSYLATED HEMOGLOBIN (HGB A1C): Hemoglobin A1C: 7.6 % — AB (ref 4.0–5.6)

## 2024-09-07 MED ORDER — AZITHROMYCIN 250 MG PO TABS: ORAL_TABLET | ORAL | 0 refills | Status: DC

## 2024-09-07 MED ORDER — TRULICITY 3 MG/0.5ML ~~LOC~~ SOAJ: 3.0000 mg | SUBCUTANEOUS | 0 refills | Status: DC

## 2024-09-07 MED ORDER — DOXEPIN HCL 75 MG PO CAPS: 75.0000 mg | ORAL_CAPSULE | Freq: Every day | ORAL | 1 refills | Status: AC

## 2024-09-07 MED ORDER — PREDNISONE 50 MG PO TABS: ORAL_TABLET | ORAL | 0 refills | Status: DC

## 2024-09-07 MED ORDER — MUPIROCIN 2 % EX OINT: TOPICAL_OINTMENT | CUTANEOUS | 0 refills | Status: AC

## 2024-09-07 NOTE — Patient Instructions (Signed)
 Zpak and prednisone  to start for 5 days Bactroban  to use is nose Increased trulicity  Increased doxepin  to 75mg  at bedtime  Diabetes: Healthy Eating for Adults When you have diabetes, also called diabetes mellitus, it's important to have healthy eating habits. Your blood sugar (glucose) levels are greatly affected by what you eat and drink. You need to eat healthy foods in the right amounts, at about the same times each day. Doing this can help you: Manage your blood sugar. Lower your risk of heart disease. Improve your blood pressure. Reach or stay at a healthy weight. What can affect my meal plan? Every person with diabetes is different. And each person has different needs for a meal plan. Your health care provider may suggest that you work with an expert in healthy eating called a dietitian. They can help you make a meal plan that's best for you. How do carbohydrates affect me? Carbohydrates, also called carbs, affect your blood sugar level more than any other type of food. Eating carbs raises the amount of sugar in your blood. It's important to know how many carbs you can safely have in each meal. This is different for every person. Your dietitian can help you calculate how many carbs you should have at each meal and for each snack. How does alcohol  affect me? Alcohol  can cause a decrease in blood sugar (hypoglycemia), especially if you use insulin  or take certain diabetes medicines by mouth. Hypoglycemia can be a life-threatening condition. Symptoms of hypoglycemia are similar to those of having too much alcohol . They include confusion, being sleepy, and feeling dizzy. Do not drink alcohol  if: Your provider tells you not to drink. You're pregnant, may be pregnant, or plan to become pregnant. What are tips for following this plan? Reading food labels Start by checking the serving size on the Nutrition Facts label of packaged foods and drinks. The number of calories and the amount of carbs,  fats, and other nutrients listed on the label are based on one serving of the item. Many items contain more than one serving per package. Check the total grams (g) of carbs in one serving. Check the number of grams of saturated fats and trans fats in one serving. Choose foods that have a low amount or none of these fats. Check the number of milligrams (mg) of salt (sodium) in one serving. Most people should limit their total sodium intake to less than 2,300 mg per day. Always check the nutrition information of foods labeled as low-fat or nonfat. These foods may be higher in added sugar or refined carbs and should be avoided. Talk to your dietitian to identify your daily goals for nutrients listed on the label. Shopping Avoid buying canned, pre-made, or processed foods. These foods tend to be high in fat, sodium, and added sugar. Shop around the outside edge of the grocery store. This is where you'll most often find fresh fruits and vegetables, bulk grains, fresh meats, and fresh dairy products. Cooking Use low-heat cooking methods, such as baking, instead of high-heat methods like deep frying. Cook using healthy oils, such as olive, canola, or sunflower oil. Avoid cooking with butter, cream, or high-fat meats. Meal planning  Eat meals and snacks regularly. Try to eat them at the same times every day. Avoid going too long without eating. Eat foods that are high in fiber, such as fresh fruits, vegetables, beans, and whole grains. Eat 4-6 oz (112-168 g) of lean protein each day, such as lean meat, chicken, fish, eggs,  or tofu. One ounce (oz) (28 g) of lean protein is equal to: 1 oz (28 g) of meat, chicken, or fish. 1 egg.  cup (62 g) of tofu. Eat some foods each day that contain healthy fats, such as avocado, nuts, seeds, and fish. What foods should I eat? Fruits Berries. Apples. Oranges. Peaches. Apricots. Plums. Grapes. Mangoes. Papayas. Pomegranates. Kiwi. Cherries. Vegetables Leafy  greens, including lettuce, spinach, kale, chard, collard greens, mustard greens, and cabbage. Beets. Cauliflower. Broccoli. Carrots. Green beans. Tomatoes. Peppers. Onions. Cucumbers. Brussels sprouts. Grains Whole grains, such as whole-wheat or whole-grain bread, crackers, tortillas, cereal, and pasta. Unsweetened oatmeal. Quinoa. Brown or wild rice. Meats and other proteins Seafood. Poultry without skin. Lean cuts of poultry and beef. Tofu. Nuts. Seeds. Dairy Low-fat or fat-free dairy products such as milk, yogurt, and cheese. The items listed above may not be all the foods and drinks you can have. Talk with a dietitian to learn more. What foods should I avoid? Fruits Fruits canned with syrup. Vegetables Canned vegetables. Frozen vegetables with butter or cream sauce. Grains Refined white flour and flour products such as bread, pasta, snack foods, and cereals. Avoid all processed foods. Meats and other proteins Fatty cuts of meat. Poultry with skin. Breaded or fried meats. Processed meat. Avoid saturated fats. Dairy Full-fat yogurt, cheese, or milk. Beverages Sweetened drinks, such as soda or iced tea. The items listed above may not be all the foods and drinks you should avoid. Talk with a dietitian to learn more. Where to find more information: To learn more, go to: Academy of Nutrition and Dietetics at DeathPrevention.it. Click Search and type diabetes. Find the link you need. Centers for Disease Control and Prevention at TonerPromos.no. Click Search and type diabetes. Find the link you need. American Diabetes Association: diabetes.org/food-nutrition [employer] of Diabetes and Digestive and Kidney Diseases: StageSync.si This information is not intended to replace advice given to you by your health care provider. Make sure you discuss any questions you have with your health care provider. Document Revised: 12/03/2023 Document Reviewed: 12/03/2023 Elsevier Patient Education   2025 [employer].

## 2024-09-07 NOTE — Progress Notes (Unsigned)
   Established Patient Office Visit  Subjective   Patient ID: Kathleen Le, female    DOB: 1961/08/18  Age: 63 y.o. MRN: [id number]  No chief complaint on file.   HPI Kathleen Le is a 63 y/o female presenting for a follow up on her Diabetes with a history of CAD, HTN, CKD, COPD.    She has a 1 month history of sinus congestion, productive cough, bilateral ear fullness, and increased dyspnea. She has been producing yellow-green rinorrhea and sputum. She has been taking her temperature daily and denies fever. Denies chest pain.  She is on an SGLT2. She has reported a yeast infection in the past and states she still gets one each month but she is able to manage them with fluconazole . No current symptoms reported.  She checks her sugars twice a day running consistently around 130 in morning and 200 in the midday. She has cut back on bread, pasta, and candy but states she eats a lot of grapes, bananas, apples, and peaches. She denies any hypoglycemic events and vision changes. She is not taking any insulin  now. She is taking trulicity  weekly and metformin , actos , glipizide , jardance daily. She is walking more.   The electrical therapy treatment regimen she was doing for her diabetic neuropathy has improved sensation in her feet. She states she now has intact sensation on her bilateral plantar surfaces.   She checks her blood pressure every day. She is well controlled on Amlodipine , Atenolol , hydrochlorothiazide , and lisinopril .    She is trying to quit smoking. She is down to 1/2 pack a day. Her COPD is controlled on Trelegy.   Sleep is not controlled with doxepin . She is taking two pills each night and states she still stays awake. She does not tolerate melatonin and slept better with THC gummies but stopped taking them about 4 months ago because it made her dyspnea worse.   {History (Optional):23778}  ROS    Objective:     BP (!) 122/50   Pulse 74   Ht 5' 5 (1.651 m)   Wt 113.9 kg    SpO2 99%   BMI 41.77 kg/m  {Vitals History (Optional):23777}  Physical Exam   Results for orders placed or performed in visit on 09/07/24  POCT HgB A1C  Result Value Ref Range   Hemoglobin A1C 7.6 (A) 4.0 - 5.6 %   HbA1c POC (<> result, manual entry)     HbA1c, POC (prediabetic range)     HbA1c, POC (controlled diabetic range)      {Labs (Optional):23779}  The 10-year ASCVD risk score (Arnett DK, et al., 2019) is: 14.2%    Assessment & Plan:   Problem List Items Addressed This Visit       Respiratory   Nasal vestibulitis     Endocrine   Type II diabetes mellitus, uncontrolled - Primary   Relevant Medications   Dulaglutide  (TRULICITY ) 3 MG/0.5ML SOAJ   Other Relevant Orders   POCT HgB A1C (Completed)     Other   Primary insomnia    Return in about 3 months (around 12/07/2024), or if symptoms worsen or fail to improve.    [address] [place], Student-PA

## 2024-09-08 ENCOUNTER — Encounter: Payer: Self-pay | Admitting: Physician Assistant

## 2024-09-08 DIAGNOSIS — E1142 Type 2 diabetes mellitus with diabetic polyneuropathy: Secondary | ICD-10-CM | POA: Insufficient documentation

## 2024-09-08 MED ORDER — DULOXETINE HCL 30 MG PO CPEP: 30.0000 mg | ORAL_CAPSULE | Freq: Two times a day (BID) | ORAL | 3 refills | Status: DC

## 2024-09-09 ENCOUNTER — Encounter: Payer: Self-pay | Admitting: Physician Assistant

## 2024-09-20 ENCOUNTER — Ambulatory Visit (INDEPENDENT_AMBULATORY_CARE_PROVIDER_SITE_OTHER): Admitting: Physician Assistant

## 2024-09-20 ENCOUNTER — Encounter: Payer: Self-pay | Admitting: Physician Assistant

## 2024-09-20 VITALS — BP 150/59 | Ht 65.0 in | Wt 252.0 lb

## 2024-09-20 DIAGNOSIS — H6122 Impacted cerumen, left ear: Secondary | ICD-10-CM | POA: Diagnosis not present

## 2024-09-20 NOTE — Progress Notes (Signed)
   Established Patient Office Visit  Subjective   Patient ID: Kathleen Le, female    DOB: 30-Dec-1960  Age: 63 y.o. MRN: [id number]  Chief Complaint  Patient presents with   Medical Management of Chronic Issues    HPI Pt has had ear wax in both ears but worse in left and has been using debrox daily but would like to try to irrigate ear again. She is having a lot of hearing loss and congestion feeling with left ear feeling like it is completely clogged.   ROS See HPI.    Objective:     BP (!) 150/59  BP Readings from Last 3 Encounters:  09/20/24 (!) 150/59  09/07/24 (!) 122/50  08/03/24 110/62   Wt Readings from Last 3 Encounters:  09/07/24 251 lb (113.9 kg)  08/03/24 253 lb (114.8 kg)  06/07/24 268 lb (121.6 kg)      Physical Exam Left ear impacted: after irrigation normal TM visualized.  Right ear with cerumen debri but normal TM visualized.  Gross hearing intact bilaterally after irrigation performed.   Kathleen Le.Cerumen Removal Template: Indication: Cerumen impaction of the ear(s) Medical necessity statement: On physical examination, cerumen impairs clinically significant portions of the external auditory canal, and tympanic membrane. Noted obstructive, copious cerumen that cannot be removed without magnification and instrumentations requiring physician skills Consent: Discussed benefits and risks of procedure and verbal consent obtained Procedure: Patient was prepped for the procedure. Utilized an otoscope to assess and take note of the ear canal, the tympanic membrane, and the presence, amount, and placement of the cerumen. Gentle water irrigation and soft plastic curette was utilized to remove cerumen.  Post procedure examination shows cerumen was completely removed. Patient tolerated procedure well. The patient is made aware that they may experience temporary vertigo, temporary hearing loss, and temporary discomfort. If these symptom last for more than 24 hours to call the  clinic or proceed to the ED.    The 10-year ASCVD risk score (Arnett DK, et al., 2019) is: 20.8%    Assessment & Plan:  SABRASABRACrystalina was seen today for medical management of chronic issues.  Diagnoses and all orders for this visit:  Hearing loss of left ear due to cerumen impaction   Right ear some debri but does not need irrigation. Left ear was irrigated today. Lots of cerumen was removed. Pt felt much better. She could hear and felt less congested. No ear pain.    Dachelle Molzahn, PA-C

## 2024-09-22 ENCOUNTER — Encounter: Payer: Self-pay | Admitting: Physician Assistant

## 2024-09-22 DIAGNOSIS — E1165 Type 2 diabetes mellitus with hyperglycemia: Secondary | ICD-10-CM

## 2024-09-22 MED ORDER — TRULICITY 3 MG/0.5ML ~~LOC~~ SOAJ: 3.0000 mg | SUBCUTANEOUS | 0 refills | Status: AC

## 2024-09-22 NOTE — Telephone Encounter (Signed)
Refils sent. 

## 2024-09-24 ENCOUNTER — Other Ambulatory Visit: Payer: Self-pay | Admitting: Physician Assistant

## 2024-09-24 DIAGNOSIS — I10 Essential (primary) hypertension: Secondary | ICD-10-CM

## 2024-09-24 DIAGNOSIS — J9690 Respiratory failure, unspecified, unspecified whether with hypoxia or hypercapnia: Secondary | ICD-10-CM | POA: Diagnosis not present

## 2024-09-27 ENCOUNTER — Other Ambulatory Visit: Payer: Self-pay | Admitting: Physician Assistant

## 2024-09-27 DIAGNOSIS — I10 Essential (primary) hypertension: Secondary | ICD-10-CM

## 2024-09-28 NOTE — Telephone Encounter (Signed)
 Patient has been advised medication was filled 09/22/2024. Patient will be following-up with her pharmacy.

## 2024-10-04 ENCOUNTER — Telehealth: Payer: Self-pay

## 2024-10-04 NOTE — Telephone Encounter (Signed)
 I called and left a message for a return call. She is due for a colonoscopy.

## 2024-10-09 ENCOUNTER — Other Ambulatory Visit: Payer: Self-pay | Admitting: Physician Assistant

## 2024-10-09 DIAGNOSIS — I10 Essential (primary) hypertension: Secondary | ICD-10-CM

## 2024-10-09 DIAGNOSIS — E1165 Type 2 diabetes mellitus with hyperglycemia: Secondary | ICD-10-CM

## 2024-10-11 ENCOUNTER — Encounter: Payer: Self-pay | Admitting: Physician Assistant

## 2024-10-11 MED ORDER — FLUCONAZOLE 150 MG PO TABS: ORAL_TABLET | ORAL | 0 refills | Status: DC

## 2024-10-24 DIAGNOSIS — J9690 Respiratory failure, unspecified, unspecified whether with hypoxia or hypercapnia: Secondary | ICD-10-CM | POA: Diagnosis not present

## 2024-11-06 ENCOUNTER — Other Ambulatory Visit: Payer: Self-pay | Admitting: Physician Assistant

## 2024-11-06 DIAGNOSIS — E1165 Type 2 diabetes mellitus with hyperglycemia: Secondary | ICD-10-CM

## 2024-11-07 ENCOUNTER — Other Ambulatory Visit: Payer: Self-pay | Admitting: Physician Assistant

## 2024-11-07 DIAGNOSIS — E1165 Type 2 diabetes mellitus with hyperglycemia: Secondary | ICD-10-CM

## 2024-11-07 DIAGNOSIS — I10 Essential (primary) hypertension: Secondary | ICD-10-CM

## 2024-11-10 ENCOUNTER — Other Ambulatory Visit: Payer: Self-pay | Admitting: Physician Assistant

## 2024-12-07 ENCOUNTER — Ambulatory Visit (INDEPENDENT_AMBULATORY_CARE_PROVIDER_SITE_OTHER): Admitting: Physician Assistant

## 2024-12-07 ENCOUNTER — Encounter: Payer: Self-pay | Admitting: Physician Assistant

## 2024-12-07 ENCOUNTER — Other Ambulatory Visit: Payer: Self-pay | Admitting: Physician Assistant

## 2024-12-07 VITALS — BP 114/60 | HR 72 | Ht 65.0 in | Wt 249.0 lb

## 2024-12-07 DIAGNOSIS — I2583 Coronary atherosclerosis due to lipid rich plaque: Secondary | ICD-10-CM

## 2024-12-07 DIAGNOSIS — J432 Centrilobular emphysema: Secondary | ICD-10-CM | POA: Diagnosis not present

## 2024-12-07 DIAGNOSIS — N1831 Chronic kidney disease, stage 3a: Secondary | ICD-10-CM | POA: Diagnosis not present

## 2024-12-07 DIAGNOSIS — I10 Essential (primary) hypertension: Secondary | ICD-10-CM

## 2024-12-07 DIAGNOSIS — I2510 Atherosclerotic heart disease of native coronary artery without angina pectoris: Secondary | ICD-10-CM | POA: Diagnosis not present

## 2024-12-07 DIAGNOSIS — Z7984 Long term (current) use of oral hypoglycemic drugs: Secondary | ICD-10-CM

## 2024-12-07 DIAGNOSIS — E1165 Type 2 diabetes mellitus with hyperglycemia: Secondary | ICD-10-CM

## 2024-12-07 DIAGNOSIS — B3731 Acute candidiasis of vulva and vagina: Secondary | ICD-10-CM | POA: Diagnosis not present

## 2024-12-07 LAB — POCT GLYCOSYLATED HEMOGLOBIN (HGB A1C): Hemoglobin A1C: 8.2 % — AB (ref 4.0–5.6)

## 2024-12-07 MED ORDER — GLIPIZIDE 10 MG PO TABS: 10.0000 mg | ORAL_TABLET | Freq: Two times a day (BID) | ORAL | 0 refills | Status: AC

## 2024-12-07 MED ORDER — HYDROCHLOROTHIAZIDE 25 MG PO TABS: 25.0000 mg | ORAL_TABLET | Freq: Every day | ORAL | 0 refills | Status: AC

## 2024-12-07 MED ORDER — TIRZEPATIDE 7.5 MG/0.5ML ~~LOC~~ SOAJ: 7.5000 mg | SUBCUTANEOUS | 0 refills | Status: AC

## 2024-12-07 MED ORDER — AMLODIPINE BESYLATE 2.5 MG PO TABS: 2.5000 mg | ORAL_TABLET | Freq: Every day | ORAL | 0 refills | Status: DC

## 2024-12-07 MED ORDER — METFORMIN HCL 1000 MG PO TABS: 1000.0000 mg | ORAL_TABLET | Freq: Two times a day (BID) | ORAL | 0 refills | Status: AC

## 2024-12-07 MED ORDER — LISINOPRIL 20 MG PO TABS: 20.0000 mg | ORAL_TABLET | Freq: Every day | ORAL | 0 refills | Status: AC

## 2024-12-07 MED ORDER — FLUCONAZOLE 150 MG PO TABS: 150.0000 mg | ORAL_TABLET | Freq: Once | ORAL | 0 refills | Status: AC

## 2024-12-07 MED ORDER — ATENOLOL 50 MG PO TABS: 50.0000 mg | ORAL_TABLET | Freq: Every day | ORAL | 0 refills | Status: AC

## 2024-12-07 MED ORDER — PIOGLITAZONE HCL 45 MG PO TABS: 45.0000 mg | ORAL_TABLET | Freq: Every day | ORAL | 0 refills | Status: DC

## 2024-12-07 NOTE — Patient Instructions (Signed)
 Stop jardiance , diflucan  given for today Stop trulicity  and start mounjaro  weekly

## 2024-12-07 NOTE — Progress Notes (Unsigned)
 Established Patient Office Visit  Subjective   Patient ID: Kathleen Le, female    DOB: Sep 05, 1961  Age: 63 y.o. MRN: [id number]  Chief Complaint  Patient presents with   Medical Management of Chronic Issues    HPI    ROS    Objective:     BP 114/60   Pulse 72   Ht 5' 5 (1.651 m)   Wt 249 lb (112.9 kg)   SpO2 99%   BMI 41.44 kg/m  BP Readings from Last 3 Encounters:  12/07/24 114/60  09/20/24 (!) 150/59  09/07/24 (!) 122/50   Wt Readings from Last 3 Encounters:  12/07/24 249 lb (112.9 kg)  09/20/24 252 lb (114.3 kg)  09/07/24 251 lb (113.9 kg)      Physical Exam   Results for orders placed or performed in visit on 12/07/24  POCT HgB A1C  Result Value Ref Range   Hemoglobin A1C 8.2 (A) 4.0 - 5.6 %   HbA1c POC (<> result, manual entry)     HbA1c, POC (prediabetic range)     HbA1c, POC (controlled diabetic range)       The 10-year ASCVD risk score (Arnett DK, et al., 2019) is: 12.5%    Assessment & Plan:  SABRASABRAJere was seen today for medical management of chronic issues.  Diagnoses and all orders for this visit:  Uncontrolled type 2 diabetes mellitus with hyperglycemia (HCC) -     POCT HgB A1C -     pioglitazone  (ACTOS ) 45 MG tablet; Take 1 tablet (45 mg total) by mouth daily. -     metFORMIN  (GLUCOPHAGE ) 1000 MG tablet; Take 1 tablet (1,000 mg total) by mouth 2 (two) times daily with a meal. -     glipiZIDE  (GLUCOTROL ) 10 MG tablet; Take 1 tablet (10 mg total) by mouth 2 (two) times daily before a meal. -     CMP14+EGFR -     Lipid panel -     TSH + free T4 -     tirzepatide  (MOUNJARO ) 7.5 MG/0.5ML Pen; Inject 7.5 mg into the skin once a week.  Essential hypertension, benign -     lisinopril  (ZESTRIL ) 20 MG tablet; Take 1 tablet (20 mg total) by mouth daily. -     hydrochlorothiazide  (HYDRODIURIL ) 25 MG tablet; Take 1 tablet (25 mg total) by mouth daily. -     atenolol  (TENORMIN ) 50 MG tablet; Take 1 tablet (50 mg total) by mouth daily. -      amLODipine  (NORVASC ) 2.5 MG tablet; Take 1 tablet (2.5 mg total) by mouth daily. -     CMP14+EGFR  Centrilobular emphysema (HCC)  Coronary artery disease due to lipid rich plaque -     Lipid panel  Stage 3a chronic kidney disease (HCC) -     CMP14+EGFR  Yeast vaginitis -     fluconazole  (DIFLUCAN ) 150 MG tablet; Take 1 tablet (150 mg total) by mouth once for 1 dose. Repeat in 48 hours if needed for symptoms.   Assessment & Plan Type 2 diabetes mellitus with hyperglycemia A1c increased to 8.2, indicating suboptimal control. Current medications include Trulicity , glipizide , metformin , and Actos . Itching possibly related to Jardiance . Recent lifestyle changes and weight loss noted. Mounjaro  considered for better glycemic control and appetite management. - Discontinued Jardiance  due to multiple yeast infections and itching. Diflucan  given today.  - Switched Trulicity  to Mounjaro  for better A1C control - Continue metfformin and actos  and glipizide  - Encouraged walking after meals. - Continue monitoring blood  sugar levels. - on statin, labs ordered to check LDL levels.  - CKD 3a and on lisionpril. D/C SGLT-2 due to side effects. Recheck CMP today.  - need to schedule eye exam  Essential hypertension Blood pressure is well-controlled.  General health maintenance Declined flu shot, pneumonia shot, and colonoscopy. Eye exam is due. - Recommended eye exam. - Discussed importance of flu and pneumonia vaccinations.      Cheo Selvey, PA-C

## 2024-12-08 ENCOUNTER — Encounter: Payer: Self-pay | Admitting: Physician Assistant

## 2024-12-12 ENCOUNTER — Encounter: Payer: Self-pay | Admitting: Physician Assistant

## 2024-12-12 MED ORDER — FLUCONAZOLE 150 MG PO TABS: 150.0000 mg | ORAL_TABLET | Freq: Once | ORAL | 0 refills | Status: AC

## 2024-12-13 ENCOUNTER — Other Ambulatory Visit: Payer: Self-pay | Admitting: Physician Assistant

## 2024-12-13 ENCOUNTER — Other Ambulatory Visit: Payer: Self-pay

## 2024-12-13 ENCOUNTER — Encounter: Payer: Self-pay | Admitting: Physician Assistant

## 2024-12-13 DIAGNOSIS — G8929 Other chronic pain: Secondary | ICD-10-CM

## 2024-12-13 DIAGNOSIS — M5136 Other intervertebral disc degeneration, lumbar region with discogenic back pain only: Secondary | ICD-10-CM

## 2024-12-13 DIAGNOSIS — E1142 Type 2 diabetes mellitus with diabetic polyneuropathy: Secondary | ICD-10-CM

## 2024-12-13 DIAGNOSIS — J439 Emphysema, unspecified: Secondary | ICD-10-CM

## 2024-12-13 DIAGNOSIS — J45909 Unspecified asthma, uncomplicated: Secondary | ICD-10-CM

## 2024-12-13 MED ORDER — ALBUTEROL SULFATE HFA 108 (90 BASE) MCG/ACT IN AERS: 1.0000 | INHALATION_SPRAY | RESPIRATORY_TRACT | 1 refills | Status: AC | PRN

## 2024-12-13 MED ORDER — DULOXETINE HCL 30 MG PO CPEP: 30.0000 mg | ORAL_CAPSULE | Freq: Two times a day (BID) | ORAL | 1 refills | Status: AC

## 2024-12-24 ENCOUNTER — Other Ambulatory Visit: Payer: Self-pay | Admitting: Physician Assistant

## 2024-12-24 DIAGNOSIS — I10 Essential (primary) hypertension: Secondary | ICD-10-CM

## 2024-12-27 ENCOUNTER — Other Ambulatory Visit: Payer: Self-pay

## 2024-12-27 MED ORDER — MELOXICAM 15 MG PO TABS: 15.0000 mg | ORAL_TABLET | Freq: Every day | ORAL | 0 refills | Status: AC

## 2024-12-27 NOTE — Telephone Encounter (Signed)
 Message was sent to patient via Mychart that this has been corrected.

## 2024-12-27 NOTE — Telephone Encounter (Signed)
" °  FYI And patient sent a Mychart message as below: Cancelled rx for Meloxicam  15mg  at [facility] [address] and [facility] in [place] as requested by patient.  "

## 2024-12-29 LAB — CMP14+EGFR
ALT: 13 IU/L (ref 0–32)
AST: 13 IU/L (ref 0–40)
Albumin: 3.9 g/dL (ref 3.9–4.9)
Alkaline Phosphatase: 77 IU/L (ref 49–135)
BUN/Creatinine Ratio: 22 (ref 12–28)
BUN: 20 mg/dL (ref 8–27)
Bilirubin Total: 0.2 mg/dL (ref 0.0–1.2)
CO2: 24 mmol/L (ref 20–29)
Calcium: 9.3 mg/dL (ref 8.7–10.3)
Chloride: 101 mmol/L (ref 96–106)
Creatinine, Ser: 0.9 mg/dL (ref 0.57–1.00)
Globulin, Total: 2.5 g/dL (ref 1.5–4.5)
Glucose: 138 mg/dL — ABNORMAL HIGH (ref 70–99)
Potassium: 4.3 mmol/L (ref 3.5–5.2)
Sodium: 140 mmol/L (ref 134–144)
Total Protein: 6.4 g/dL (ref 6.0–8.5)
eGFR: 73 mL/min/1.73

## 2024-12-29 LAB — LIPID PANEL
Chol/HDL Ratio: 2.5 ratio (ref 0.0–4.4)
Cholesterol, Total: 144 mg/dL (ref 100–199)
HDL: 58 mg/dL
LDL Chol Calc (NIH): 62 mg/dL (ref 0–99)
Triglycerides: 139 mg/dL (ref 0–149)
VLDL Cholesterol Cal: 24 mg/dL (ref 5–40)

## 2024-12-29 LAB — TSH+FREE T4
Free T4: 1.08 ng/dL (ref 0.82–1.77)
TSH: 1.88 u[IU]/mL (ref 0.450–4.500)

## 2024-12-30 ENCOUNTER — Ambulatory Visit: Payer: Self-pay | Admitting: Physician Assistant

## 2024-12-30 NOTE — Progress Notes (Signed)
 Kathleen Le,   Kidney function much better!  LDL, bad cholesterol, to goal.  Thyroid looks great.

## 2025-01-07 ENCOUNTER — Other Ambulatory Visit: Payer: Self-pay | Admitting: Physician Assistant

## 2025-01-07 DIAGNOSIS — I10 Essential (primary) hypertension: Secondary | ICD-10-CM

## 2025-01-07 DIAGNOSIS — E1165 Type 2 diabetes mellitus with hyperglycemia: Secondary | ICD-10-CM

## 2025-01-11 ENCOUNTER — Encounter: Payer: Self-pay | Admitting: Physician Assistant

## 2025-01-11 DIAGNOSIS — E1165 Type 2 diabetes mellitus with hyperglycemia: Secondary | ICD-10-CM

## 2025-01-11 NOTE — Telephone Encounter (Signed)
 Requesting rx rf changed from Trulicity  to Novollin due to not having insurance at the present time  Pended from old prescription  Last written 06/30/2021 Last OV 12/07/2024 Upcoming appt 03/07/2025

## 2025-01-13 MED ORDER — INSULIN NPH (HUMAN) (ISOPHANE) 100 UNIT/ML ~~LOC~~ SUSP: SUBCUTANEOUS | 3 refills | Status: AC

## 2025-01-13 NOTE — Telephone Encounter (Signed)
 Many units she was using etc.  The prescription is blank so not sure if she remember she can let us  know and we can put it on there and at least start with that and adjusted if needed.

## 2025-01-17 NOTE — Telephone Encounter (Signed)
 Patient states she has picked up the Novolin without problems at pharmacy.

## 2025-02-03 ENCOUNTER — Other Ambulatory Visit: Payer: Self-pay

## 2025-02-03 ENCOUNTER — Encounter: Payer: Self-pay | Admitting: Physician Assistant

## 2025-02-03 MED ORDER — ROSUVASTATIN CALCIUM 40 MG PO TABS: 40.0000 mg | ORAL_TABLET | Freq: Every day | ORAL | 3 refills | Status: AC

## 2025-02-03 NOTE — Telephone Encounter (Signed)
 Copied from CRM #[id number]. Topic: Clinical - Medication Refill >> Feb 03, 2025  4:21 PM Delon T wrote: Medication: rosuvastatin  (CRESTOR ) 40 MG tablet - 30 day supply due to no insurance  Has the patient contacted their pharmacy? Yes (Agent: If no, request that the patient contact the pharmacy for the refill. If patient does not wish to contact the pharmacy document the reason why and proceed with request.) (Agent: If yes, when and what did the pharmacy advise?)  This is the patient's preferred pharmacy:  [facility] [address] [place] - [address] [place] [address] Phone: [phone] Fax: [phone]    Is this the correct pharmacy for this prescription? Yes If no, delete pharmacy and type the correct one.   Has the prescription been filled recently? Yes  Is the patient out of the medication? Yes  Has the patient been seen for an appointment in the last year OR does the patient have an upcoming appointment? Yes  Can we respond through MyChart? Yes  Agent: Please be advised that Rx refills may take up to 3 business days. We ask that you follow-up with your pharmacy.

## 2025-02-03 NOTE — Telephone Encounter (Signed)
 Requesting rx rf of Crestor  40mg   Last written by outside provider 01/19/2024 Last OV 12/07/2024 Upcoming appt 03/07/2025

## 2025-02-03 NOTE — Telephone Encounter (Signed)
 Script had already been sent in for refill. Patient has been informed.

## 2025-03-07 ENCOUNTER — Ambulatory Visit: Admitting: Physician Assistant
